# Patient Record
Sex: Female | Born: 1945 | Race: White | Hispanic: No | Smoking: Never smoker
Health system: Southern US, Community
[De-identification: ages and names within clinical notes are randomized; demographics above are authoritative.]

## PROBLEM LIST (undated history)

## (undated) DIAGNOSIS — E8881 Metabolic syndrome: Secondary | ICD-10-CM

## (undated) DIAGNOSIS — F411 Generalized anxiety disorder: Secondary | ICD-10-CM

## (undated) DIAGNOSIS — I10 Essential (primary) hypertension: Secondary | ICD-10-CM

## (undated) DIAGNOSIS — I2510 Atherosclerotic heart disease of native coronary artery without angina pectoris: Secondary | ICD-10-CM

## (undated) DIAGNOSIS — H409 Unspecified glaucoma: Secondary | ICD-10-CM

## (undated) DIAGNOSIS — M1990 Unspecified osteoarthritis, unspecified site: Secondary | ICD-10-CM

## (undated) DIAGNOSIS — K469 Unspecified abdominal hernia without obstruction or gangrene: Secondary | ICD-10-CM

## (undated) DIAGNOSIS — H269 Unspecified cataract: Secondary | ICD-10-CM

## (undated) DIAGNOSIS — Z9889 Other specified postprocedural states: Secondary | ICD-10-CM

## (undated) DIAGNOSIS — K5790 Diverticulosis of intestine, part unspecified, without perforation or abscess without bleeding: Secondary | ICD-10-CM

## (undated) DIAGNOSIS — D649 Anemia, unspecified: Secondary | ICD-10-CM

## (undated) DIAGNOSIS — T8859XA Other complications of anesthesia, initial encounter: Secondary | ICD-10-CM

## (undated) DIAGNOSIS — Z86010 Personal history of colonic polyps: Secondary | ICD-10-CM

## (undated) DIAGNOSIS — E785 Hyperlipidemia, unspecified: Secondary | ICD-10-CM

## (undated) DIAGNOSIS — I255 Ischemic cardiomyopathy: Secondary | ICD-10-CM

## (undated) DIAGNOSIS — G5600 Carpal tunnel syndrome, unspecified upper limb: Secondary | ICD-10-CM

## (undated) DIAGNOSIS — I219 Acute myocardial infarction, unspecified: Secondary | ICD-10-CM

## (undated) DIAGNOSIS — R112 Nausea with vomiting, unspecified: Secondary | ICD-10-CM

## (undated) DIAGNOSIS — G43009 Migraine without aura, not intractable, without status migrainosus: Secondary | ICD-10-CM

## (undated) DIAGNOSIS — H189 Unspecified disorder of cornea: Secondary | ICD-10-CM

## (undated) DIAGNOSIS — T4145XA Adverse effect of unspecified anesthetic, initial encounter: Secondary | ICD-10-CM

## (undated) DIAGNOSIS — S4290XA Fracture of unspecified shoulder girdle, part unspecified, initial encounter for closed fracture: Secondary | ICD-10-CM

## (undated) DIAGNOSIS — K219 Gastro-esophageal reflux disease without esophagitis: Secondary | ICD-10-CM

## (undated) DIAGNOSIS — Z860101 Personal history of adenomatous and serrated colon polyps: Secondary | ICD-10-CM

## (undated) DIAGNOSIS — G4730 Sleep apnea, unspecified: Secondary | ICD-10-CM

## (undated) DIAGNOSIS — G44049 Chronic paroxysmal hemicrania, not intractable: Secondary | ICD-10-CM

## (undated) HISTORY — PX: CORNEAL TRANSPLANT: SHX108

## (undated) HISTORY — DX: Fracture of unspecified shoulder girdle, part unspecified, initial encounter for closed fracture: S42.90XA

## (undated) HISTORY — DX: Chronic paroxysmal hemicrania, not intractable: G44.049

## (undated) HISTORY — DX: Atherosclerotic heart disease of native coronary artery without angina pectoris: I25.10

## (undated) HISTORY — PX: OTHER SURGICAL HISTORY: SHX169

## (undated) HISTORY — DX: Generalized anxiety disorder: F41.1

## (undated) HISTORY — PX: TOTAL KNEE ARTHROPLASTY: SHX125

## (undated) HISTORY — DX: Acute myocardial infarction, unspecified: I21.9

## (undated) HISTORY — DX: Anemia, unspecified: D64.9

## (undated) HISTORY — PX: ABDOMINAL HYSTERECTOMY: SHX81

## (undated) HISTORY — PX: COLONOSCOPY W/ POLYPECTOMY: SHX1380

## (undated) HISTORY — DX: Unspecified disorder of cornea: H18.9

## (undated) HISTORY — DX: Unspecified cataract: H26.9

## (undated) HISTORY — PX: BUNIONECTOMY: SHX129

## (undated) HISTORY — DX: Essential (primary) hypertension: I10

## (undated) HISTORY — DX: Personal history of colonic polyps: Z86.010

## (undated) HISTORY — DX: Metabolic syndrome: E88.81

## (undated) HISTORY — DX: Sleep apnea, unspecified: G47.30

## (undated) HISTORY — DX: Ischemic cardiomyopathy: I25.5

## (undated) HISTORY — PX: APPENDECTOMY: SHX54

## (undated) HISTORY — DX: Metabolic syndrome: E88.810

## (undated) HISTORY — PX: CHOLECYSTECTOMY: SHX55

## (undated) HISTORY — DX: Carpal tunnel syndrome, unspecified upper limb: G56.00

## (undated) HISTORY — DX: Gastro-esophageal reflux disease without esophagitis: K21.9

## (undated) HISTORY — DX: Unspecified glaucoma: H40.9

## (undated) HISTORY — DX: Unspecified abdominal hernia without obstruction or gangrene: K46.9

## (undated) HISTORY — DX: Unspecified osteoarthritis, unspecified site: M19.90

## (undated) HISTORY — DX: Diverticulosis of intestine, part unspecified, without perforation or abscess without bleeding: K57.90

## (undated) HISTORY — DX: Migraine without aura, not intractable, without status migrainosus: G43.009

## (undated) HISTORY — PX: HEMORRHOID SURGERY: SHX153

## (undated) HISTORY — DX: Personal history of adenomatous and serrated colon polyps: Z86.0101

## (undated) HISTORY — DX: Hyperlipidemia, unspecified: E78.5

---

## 1994-02-13 HISTORY — PX: BREAST LUMPECTOMY: SHX2

## 1994-02-15 HISTORY — PX: BREAST EXCISIONAL BIOPSY: SUR124

## 1997-08-22 ENCOUNTER — Inpatient Hospital Stay (HOSPITAL_COMMUNITY): Admission: EM | Admit: 1997-08-22 | Discharge: 1997-08-25 | Payer: Self-pay | Admitting: Emergency Medicine

## 1997-09-18 ENCOUNTER — Inpatient Hospital Stay (HOSPITAL_COMMUNITY): Admission: RE | Admit: 1997-09-18 | Discharge: 1997-09-20 | Payer: Self-pay | Admitting: Neurosurgery

## 1998-02-26 ENCOUNTER — Encounter: Payer: Self-pay | Admitting: Neurosurgery

## 1998-02-26 ENCOUNTER — Ambulatory Visit (HOSPITAL_COMMUNITY): Admission: RE | Admit: 1998-02-26 | Discharge: 1998-02-26 | Payer: Self-pay | Admitting: Neurosurgery

## 1998-03-10 ENCOUNTER — Encounter: Payer: Self-pay | Admitting: Neurosurgery

## 1998-03-10 ENCOUNTER — Ambulatory Visit (HOSPITAL_COMMUNITY): Admission: RE | Admit: 1998-03-10 | Discharge: 1998-03-10 | Payer: Self-pay | Admitting: Neurosurgery

## 1998-03-26 ENCOUNTER — Encounter: Payer: Self-pay | Admitting: Neurosurgery

## 1998-03-26 ENCOUNTER — Ambulatory Visit (HOSPITAL_COMMUNITY): Admission: RE | Admit: 1998-03-26 | Discharge: 1998-03-26 | Payer: Self-pay | Admitting: Neurosurgery

## 1998-04-09 ENCOUNTER — Ambulatory Visit (HOSPITAL_COMMUNITY): Admission: RE | Admit: 1998-04-09 | Discharge: 1998-04-09 | Payer: Self-pay | Admitting: Neurosurgery

## 1998-04-09 ENCOUNTER — Encounter: Payer: Self-pay | Admitting: Neurosurgery

## 1998-06-23 ENCOUNTER — Ambulatory Visit (HOSPITAL_COMMUNITY): Admission: RE | Admit: 1998-06-23 | Discharge: 1998-06-23 | Payer: Self-pay | Admitting: Neurosurgery

## 1998-06-23 ENCOUNTER — Encounter: Payer: Self-pay | Admitting: Neurosurgery

## 1999-03-17 ENCOUNTER — Encounter: Payer: Self-pay | Admitting: Emergency Medicine

## 1999-03-17 ENCOUNTER — Emergency Department (HOSPITAL_COMMUNITY): Admission: EM | Admit: 1999-03-17 | Discharge: 1999-03-17 | Payer: Self-pay | Admitting: Emergency Medicine

## 1999-04-05 ENCOUNTER — Encounter: Payer: Self-pay | Admitting: Gastroenterology

## 1999-04-05 ENCOUNTER — Ambulatory Visit (HOSPITAL_COMMUNITY): Admission: RE | Admit: 1999-04-05 | Discharge: 1999-04-05 | Payer: Self-pay | Admitting: Gastroenterology

## 1999-04-18 ENCOUNTER — Ambulatory Visit (HOSPITAL_COMMUNITY): Admission: RE | Admit: 1999-04-18 | Discharge: 1999-04-18 | Payer: Self-pay | Admitting: Internal Medicine

## 1999-04-18 ENCOUNTER — Encounter: Payer: Self-pay | Admitting: Internal Medicine

## 1999-04-20 ENCOUNTER — Encounter (INDEPENDENT_AMBULATORY_CARE_PROVIDER_SITE_OTHER): Payer: Self-pay | Admitting: Specialist

## 1999-04-20 ENCOUNTER — Observation Stay (HOSPITAL_COMMUNITY): Admission: EM | Admit: 1999-04-20 | Discharge: 1999-04-21 | Payer: Self-pay | Admitting: Emergency Medicine

## 1999-09-08 ENCOUNTER — Encounter: Admission: RE | Admit: 1999-09-08 | Discharge: 1999-09-08 | Payer: Self-pay | Admitting: Gynecology

## 1999-09-08 ENCOUNTER — Encounter: Payer: Self-pay | Admitting: Gynecology

## 1999-09-11 ENCOUNTER — Ambulatory Visit (HOSPITAL_COMMUNITY): Admission: RE | Admit: 1999-09-11 | Discharge: 1999-09-11 | Payer: Self-pay | Admitting: Neurosurgery

## 1999-09-11 ENCOUNTER — Encounter: Payer: Self-pay | Admitting: Neurosurgery

## 1999-10-01 ENCOUNTER — Ambulatory Visit (HOSPITAL_COMMUNITY): Admission: RE | Admit: 1999-10-01 | Discharge: 1999-10-01 | Payer: Self-pay | Admitting: Neurosurgery

## 1999-10-01 ENCOUNTER — Encounter: Payer: Self-pay | Admitting: Neurosurgery

## 1999-10-02 ENCOUNTER — Encounter: Payer: Self-pay | Admitting: Neurosurgery

## 1999-11-15 ENCOUNTER — Other Ambulatory Visit: Admission: RE | Admit: 1999-11-15 | Discharge: 1999-11-15 | Payer: Self-pay | Admitting: Gynecology

## 2000-05-21 ENCOUNTER — Encounter: Payer: Self-pay | Admitting: Neurosurgery

## 2000-05-21 ENCOUNTER — Ambulatory Visit (HOSPITAL_COMMUNITY): Admission: RE | Admit: 2000-05-21 | Discharge: 2000-05-21 | Payer: Self-pay | Admitting: Neurosurgery

## 2000-06-06 ENCOUNTER — Encounter: Payer: Self-pay | Admitting: Neurosurgery

## 2000-06-06 ENCOUNTER — Encounter: Admission: RE | Admit: 2000-06-06 | Discharge: 2000-06-06 | Payer: Self-pay | Admitting: Neurosurgery

## 2000-06-25 ENCOUNTER — Encounter: Payer: Self-pay | Admitting: Neurosurgery

## 2000-06-25 ENCOUNTER — Encounter: Admission: RE | Admit: 2000-06-25 | Discharge: 2000-06-25 | Payer: Self-pay | Admitting: Neurosurgery

## 2000-07-10 ENCOUNTER — Encounter: Payer: Self-pay | Admitting: Neurosurgery

## 2000-07-10 ENCOUNTER — Encounter: Admission: RE | Admit: 2000-07-10 | Discharge: 2000-07-10 | Payer: Self-pay | Admitting: Neurosurgery

## 2000-08-21 ENCOUNTER — Encounter: Payer: Self-pay | Admitting: Neurosurgery

## 2000-08-21 ENCOUNTER — Encounter: Admission: RE | Admit: 2000-08-21 | Discharge: 2000-08-21 | Payer: Self-pay | Admitting: Neurosurgery

## 2000-08-31 ENCOUNTER — Encounter: Payer: Self-pay | Admitting: Gastroenterology

## 2000-08-31 ENCOUNTER — Encounter: Admission: RE | Admit: 2000-08-31 | Discharge: 2000-08-31 | Payer: Self-pay | Admitting: Gastroenterology

## 2000-09-03 ENCOUNTER — Encounter: Payer: Self-pay | Admitting: Neurosurgery

## 2000-09-03 ENCOUNTER — Encounter: Admission: RE | Admit: 2000-09-03 | Discharge: 2000-09-03 | Payer: Self-pay | Admitting: Neurosurgery

## 2000-09-14 ENCOUNTER — Encounter: Admission: RE | Admit: 2000-09-14 | Discharge: 2000-09-14 | Payer: Self-pay | Admitting: Internal Medicine

## 2000-09-14 ENCOUNTER — Encounter: Payer: Self-pay | Admitting: Internal Medicine

## 2000-12-27 ENCOUNTER — Encounter (INDEPENDENT_AMBULATORY_CARE_PROVIDER_SITE_OTHER): Payer: Self-pay | Admitting: Gastroenterology

## 2001-01-07 ENCOUNTER — Encounter: Admission: RE | Admit: 2001-01-07 | Discharge: 2001-01-07 | Payer: Self-pay | Admitting: Neurosurgery

## 2001-01-07 ENCOUNTER — Encounter: Payer: Self-pay | Admitting: Neurosurgery

## 2001-01-25 ENCOUNTER — Encounter: Admission: RE | Admit: 2001-01-25 | Discharge: 2001-01-25 | Payer: Self-pay | Admitting: Orthopaedic Surgery

## 2001-01-25 ENCOUNTER — Encounter: Payer: Self-pay | Admitting: Orthopaedic Surgery

## 2001-04-29 ENCOUNTER — Encounter: Admission: RE | Admit: 2001-04-29 | Discharge: 2001-04-29 | Payer: Self-pay | Admitting: Orthopaedic Surgery

## 2001-04-29 ENCOUNTER — Encounter: Payer: Self-pay | Admitting: Orthopaedic Surgery

## 2001-10-01 ENCOUNTER — Encounter: Admission: RE | Admit: 2001-10-01 | Discharge: 2001-10-01 | Payer: Self-pay | Admitting: Gynecology

## 2001-10-01 ENCOUNTER — Encounter: Payer: Self-pay | Admitting: Gynecology

## 2002-01-15 ENCOUNTER — Encounter: Payer: Self-pay | Admitting: Orthopedic Surgery

## 2002-01-15 ENCOUNTER — Encounter: Admission: RE | Admit: 2002-01-15 | Discharge: 2002-01-15 | Payer: Self-pay | Admitting: Orthopedic Surgery

## 2002-01-29 ENCOUNTER — Encounter: Payer: Self-pay | Admitting: Orthopaedic Surgery

## 2002-01-29 ENCOUNTER — Encounter: Admission: RE | Admit: 2002-01-29 | Discharge: 2002-01-29 | Payer: Self-pay | Admitting: Orthopaedic Surgery

## 2002-02-11 ENCOUNTER — Encounter: Admission: RE | Admit: 2002-02-11 | Discharge: 2002-02-11 | Payer: Self-pay | Admitting: Orthopaedic Surgery

## 2002-02-11 ENCOUNTER — Encounter: Payer: Self-pay | Admitting: Orthopaedic Surgery

## 2002-02-25 ENCOUNTER — Encounter: Admission: RE | Admit: 2002-02-25 | Discharge: 2002-02-25 | Payer: Self-pay | Admitting: Orthopaedic Surgery

## 2002-02-25 ENCOUNTER — Encounter: Payer: Self-pay | Admitting: Orthopaedic Surgery

## 2002-03-05 ENCOUNTER — Encounter: Payer: Self-pay | Admitting: Orthopedic Surgery

## 2002-03-12 ENCOUNTER — Observation Stay (HOSPITAL_COMMUNITY): Admission: RE | Admit: 2002-03-12 | Discharge: 2002-03-13 | Payer: Self-pay | Admitting: Orthopedic Surgery

## 2002-03-12 ENCOUNTER — Encounter: Payer: Self-pay | Admitting: Orthopedic Surgery

## 2002-05-15 ENCOUNTER — Other Ambulatory Visit: Admission: RE | Admit: 2002-05-15 | Discharge: 2002-05-15 | Payer: Self-pay | Admitting: Gynecology

## 2002-10-14 ENCOUNTER — Encounter: Admission: RE | Admit: 2002-10-14 | Discharge: 2002-10-14 | Payer: Self-pay | Admitting: Neurosurgery

## 2002-10-14 ENCOUNTER — Encounter: Payer: Self-pay | Admitting: Neurosurgery

## 2002-11-19 ENCOUNTER — Ambulatory Visit (HOSPITAL_COMMUNITY): Admission: RE | Admit: 2002-11-19 | Discharge: 2002-11-19 | Payer: Self-pay | Admitting: Gynecology

## 2002-11-19 ENCOUNTER — Encounter: Payer: Self-pay | Admitting: Gynecology

## 2002-12-12 ENCOUNTER — Encounter: Admission: RE | Admit: 2002-12-12 | Discharge: 2002-12-12 | Payer: Self-pay | Admitting: Obstetrics and Gynecology

## 2002-12-30 ENCOUNTER — Encounter: Admission: RE | Admit: 2002-12-30 | Discharge: 2002-12-30 | Payer: Self-pay | Admitting: Obstetrics and Gynecology

## 2003-01-28 ENCOUNTER — Encounter: Admission: RE | Admit: 2003-01-28 | Discharge: 2003-01-28 | Payer: Self-pay | Admitting: Neurosurgery

## 2003-02-11 ENCOUNTER — Encounter: Admission: RE | Admit: 2003-02-11 | Discharge: 2003-02-11 | Payer: Self-pay | Admitting: Neurosurgery

## 2003-08-27 ENCOUNTER — Encounter: Admission: RE | Admit: 2003-08-27 | Discharge: 2003-08-27 | Payer: Self-pay | Admitting: Neurosurgery

## 2003-10-27 ENCOUNTER — Encounter: Admission: RE | Admit: 2003-10-27 | Discharge: 2003-10-27 | Payer: Self-pay | Admitting: Neurosurgery

## 2003-11-24 ENCOUNTER — Encounter: Admission: RE | Admit: 2003-11-24 | Discharge: 2003-11-24 | Payer: Self-pay | Admitting: Gynecology

## 2003-12-22 ENCOUNTER — Ambulatory Visit: Payer: Self-pay | Admitting: Internal Medicine

## 2004-03-28 ENCOUNTER — Other Ambulatory Visit: Admission: RE | Admit: 2004-03-28 | Discharge: 2004-03-28 | Payer: Self-pay | Admitting: Obstetrics and Gynecology

## 2004-06-14 ENCOUNTER — Encounter: Admission: RE | Admit: 2004-06-14 | Discharge: 2004-06-14 | Payer: Self-pay | Admitting: Neurosurgery

## 2004-09-19 ENCOUNTER — Encounter: Admission: RE | Admit: 2004-09-19 | Discharge: 2004-09-19 | Payer: Self-pay | Admitting: Neurosurgery

## 2004-09-29 ENCOUNTER — Ambulatory Visit: Payer: Self-pay | Admitting: Internal Medicine

## 2004-12-09 ENCOUNTER — Ambulatory Visit: Payer: Self-pay | Admitting: Internal Medicine

## 2004-12-19 ENCOUNTER — Ambulatory Visit: Payer: Self-pay | Admitting: Gastroenterology

## 2004-12-26 ENCOUNTER — Encounter: Admission: RE | Admit: 2004-12-26 | Discharge: 2004-12-26 | Payer: Self-pay | Admitting: Obstetrics and Gynecology

## 2005-01-13 ENCOUNTER — Encounter: Admission: RE | Admit: 2005-01-13 | Discharge: 2005-01-13 | Payer: Self-pay | Admitting: Neurosurgery

## 2005-05-23 ENCOUNTER — Encounter: Admission: RE | Admit: 2005-05-23 | Discharge: 2005-05-23 | Payer: Self-pay | Admitting: Neurosurgery

## 2005-06-02 ENCOUNTER — Encounter: Admission: RE | Admit: 2005-06-02 | Discharge: 2005-06-02 | Payer: Self-pay | Admitting: Neurosurgery

## 2005-07-04 ENCOUNTER — Encounter: Admission: RE | Admit: 2005-07-04 | Discharge: 2005-07-04 | Payer: Self-pay | Admitting: Neurosurgery

## 2005-07-18 ENCOUNTER — Encounter: Admission: RE | Admit: 2005-07-18 | Discharge: 2005-07-18 | Payer: Self-pay | Admitting: Neurosurgery

## 2005-11-21 ENCOUNTER — Ambulatory Visit: Payer: Self-pay | Admitting: Internal Medicine

## 2005-11-22 ENCOUNTER — Encounter: Admission: RE | Admit: 2005-11-22 | Discharge: 2005-11-22 | Payer: Self-pay | Admitting: Neurosurgery

## 2005-11-30 ENCOUNTER — Ambulatory Visit: Payer: Self-pay | Admitting: Internal Medicine

## 2006-02-01 ENCOUNTER — Encounter: Admission: RE | Admit: 2006-02-01 | Discharge: 2006-02-01 | Payer: Self-pay | Admitting: Neurosurgery

## 2006-02-26 ENCOUNTER — Encounter: Admission: RE | Admit: 2006-02-26 | Discharge: 2006-02-26 | Payer: Self-pay | Admitting: Internal Medicine

## 2006-03-13 ENCOUNTER — Encounter: Admission: RE | Admit: 2006-03-13 | Discharge: 2006-03-13 | Payer: Self-pay | Admitting: Internal Medicine

## 2006-10-23 ENCOUNTER — Ambulatory Visit: Payer: Self-pay | Admitting: Internal Medicine

## 2006-10-25 LAB — CONVERTED CEMR LAB
AST: 24 units/L (ref 0–37)
Amylase: 44 units/L (ref 27–131)
Bilirubin, Direct: 0.1 mg/dL (ref 0.0–0.3)
Chloride: 102 meq/L (ref 96–112)
Creatinine, Ser: 0.8 mg/dL (ref 0.4–1.2)
Eosinophils Relative: 1.5 % (ref 0.0–5.0)
Glucose, Bld: 90 mg/dL (ref 70–99)
HCT: 40.3 % (ref 36.0–46.0)
Hemoglobin: 14.2 g/dL (ref 12.0–15.0)
MCV: 89.6 fL (ref 78.0–100.0)
Monocytes Absolute: 0.8 10*3/uL — ABNORMAL HIGH (ref 0.2–0.7)
Neutrophils Relative %: 58.5 % (ref 43.0–77.0)
RBC: 4.5 M/uL (ref 3.87–5.11)
RDW: 13.4 % (ref 11.5–14.6)
Sodium: 140 meq/L (ref 135–145)
Total Bilirubin: 0.6 mg/dL (ref 0.3–1.2)
Total Protein: 7.1 g/dL (ref 6.0–8.3)
WBC: 7.5 10*3/uL (ref 4.5–10.5)

## 2006-10-27 ENCOUNTER — Ambulatory Visit (HOSPITAL_COMMUNITY): Admission: RE | Admit: 2006-10-27 | Discharge: 2006-10-27 | Payer: Self-pay | Admitting: Family Medicine

## 2006-10-29 ENCOUNTER — Telehealth (INDEPENDENT_AMBULATORY_CARE_PROVIDER_SITE_OTHER): Payer: Self-pay | Admitting: *Deleted

## 2006-10-30 ENCOUNTER — Ambulatory Visit: Payer: Self-pay | Admitting: Internal Medicine

## 2006-10-30 DIAGNOSIS — I10 Essential (primary) hypertension: Secondary | ICD-10-CM | POA: Insufficient documentation

## 2006-10-31 ENCOUNTER — Encounter: Admission: RE | Admit: 2006-10-31 | Discharge: 2006-10-31 | Payer: Self-pay | Admitting: Internal Medicine

## 2006-10-31 ENCOUNTER — Encounter (INDEPENDENT_AMBULATORY_CARE_PROVIDER_SITE_OTHER): Payer: Self-pay | Admitting: *Deleted

## 2006-11-01 ENCOUNTER — Encounter: Payer: Self-pay | Admitting: Internal Medicine

## 2006-11-01 ENCOUNTER — Encounter (INDEPENDENT_AMBULATORY_CARE_PROVIDER_SITE_OTHER): Payer: Self-pay | Admitting: *Deleted

## 2006-11-12 ENCOUNTER — Ambulatory Visit: Payer: Self-pay | Admitting: Internal Medicine

## 2006-11-12 DIAGNOSIS — F411 Generalized anxiety disorder: Secondary | ICD-10-CM | POA: Insufficient documentation

## 2006-11-15 ENCOUNTER — Ambulatory Visit: Payer: Self-pay | Admitting: Gastroenterology

## 2006-11-16 ENCOUNTER — Encounter: Payer: Self-pay | Admitting: Internal Medicine

## 2006-11-16 ENCOUNTER — Ambulatory Visit: Payer: Self-pay | Admitting: Gastroenterology

## 2006-11-16 DIAGNOSIS — K449 Diaphragmatic hernia without obstruction or gangrene: Secondary | ICD-10-CM | POA: Insufficient documentation

## 2006-12-24 ENCOUNTER — Ambulatory Visit: Payer: Self-pay | Admitting: Internal Medicine

## 2007-02-22 ENCOUNTER — Ambulatory Visit: Payer: Self-pay | Admitting: Gastroenterology

## 2007-04-23 DIAGNOSIS — D126 Benign neoplasm of colon, unspecified: Secondary | ICD-10-CM

## 2007-04-23 DIAGNOSIS — K219 Gastro-esophageal reflux disease without esophagitis: Secondary | ICD-10-CM

## 2007-04-23 DIAGNOSIS — K5730 Diverticulosis of large intestine without perforation or abscess without bleeding: Secondary | ICD-10-CM | POA: Insufficient documentation

## 2007-04-25 ENCOUNTER — Ambulatory Visit: Payer: Self-pay | Admitting: Internal Medicine

## 2007-04-25 DIAGNOSIS — G5600 Carpal tunnel syndrome, unspecified upper limb: Secondary | ICD-10-CM | POA: Insufficient documentation

## 2007-04-30 ENCOUNTER — Encounter: Admission: RE | Admit: 2007-04-30 | Discharge: 2007-04-30 | Payer: Self-pay | Admitting: Internal Medicine

## 2007-04-30 ENCOUNTER — Encounter (INDEPENDENT_AMBULATORY_CARE_PROVIDER_SITE_OTHER): Payer: Self-pay | Admitting: *Deleted

## 2007-05-02 ENCOUNTER — Encounter (INDEPENDENT_AMBULATORY_CARE_PROVIDER_SITE_OTHER): Payer: Self-pay | Admitting: *Deleted

## 2007-05-28 ENCOUNTER — Encounter: Payer: Self-pay | Admitting: Internal Medicine

## 2007-06-05 ENCOUNTER — Encounter: Payer: Self-pay | Admitting: Internal Medicine

## 2007-06-19 ENCOUNTER — Ambulatory Visit: Payer: Self-pay | Admitting: Internal Medicine

## 2007-06-19 DIAGNOSIS — IMO0002 Reserved for concepts with insufficient information to code with codable children: Secondary | ICD-10-CM | POA: Insufficient documentation

## 2007-06-19 DIAGNOSIS — E8881 Metabolic syndrome: Secondary | ICD-10-CM

## 2007-06-19 DIAGNOSIS — M1710 Unilateral primary osteoarthritis, unspecified knee: Secondary | ICD-10-CM | POA: Insufficient documentation

## 2007-07-31 ENCOUNTER — Inpatient Hospital Stay (HOSPITAL_COMMUNITY): Admission: RE | Admit: 2007-07-31 | Discharge: 2007-08-04 | Payer: Self-pay | Admitting: Orthopedic Surgery

## 2007-08-28 ENCOUNTER — Ambulatory Visit: Payer: Self-pay | Admitting: Internal Medicine

## 2007-08-28 ENCOUNTER — Telehealth (INDEPENDENT_AMBULATORY_CARE_PROVIDER_SITE_OTHER): Payer: Self-pay | Admitting: *Deleted

## 2007-08-28 LAB — CONVERTED CEMR LAB: Hemoglobin: 13 g/dL

## 2007-08-29 ENCOUNTER — Encounter (INDEPENDENT_AMBULATORY_CARE_PROVIDER_SITE_OTHER): Payer: Self-pay | Admitting: *Deleted

## 2007-08-29 LAB — CONVERTED CEMR LAB
HCT: 38.1 % (ref 36.0–46.0)
Hemoglobin: 12.7 g/dL (ref 12.0–15.0)
MCHC: 33.3 g/dL (ref 30.0–36.0)
MCV: 88.2 fL (ref 78.0–100.0)
Monocytes Absolute: 0.6 10*3/uL (ref 0.1–1.0)
Monocytes Relative: 8 % (ref 3.0–12.0)
Neutro Abs: 5.6 10*3/uL (ref 1.4–7.7)
Potassium: 4.1 meq/L (ref 3.5–5.1)
RDW: 13.9 % (ref 11.5–14.6)

## 2007-11-06 ENCOUNTER — Ambulatory Visit: Payer: Self-pay | Admitting: Internal Medicine

## 2007-12-27 ENCOUNTER — Encounter: Payer: Self-pay | Admitting: Internal Medicine

## 2008-01-15 ENCOUNTER — Ambulatory Visit: Payer: Self-pay | Admitting: Gastroenterology

## 2008-01-24 ENCOUNTER — Ambulatory Visit: Payer: Self-pay | Admitting: Internal Medicine

## 2008-01-31 ENCOUNTER — Telehealth: Payer: Self-pay | Admitting: Gastroenterology

## 2008-02-18 ENCOUNTER — Telehealth (INDEPENDENT_AMBULATORY_CARE_PROVIDER_SITE_OTHER): Payer: Self-pay | Admitting: *Deleted

## 2008-03-17 ENCOUNTER — Telehealth (INDEPENDENT_AMBULATORY_CARE_PROVIDER_SITE_OTHER): Payer: Self-pay | Admitting: *Deleted

## 2008-04-30 ENCOUNTER — Encounter: Admission: RE | Admit: 2008-04-30 | Discharge: 2008-04-30 | Payer: Self-pay | Admitting: Obstetrics and Gynecology

## 2008-06-22 ENCOUNTER — Ambulatory Visit: Payer: Self-pay | Admitting: Internal Medicine

## 2008-07-14 ENCOUNTER — Telehealth (INDEPENDENT_AMBULATORY_CARE_PROVIDER_SITE_OTHER): Payer: Self-pay | Admitting: *Deleted

## 2008-07-14 ENCOUNTER — Emergency Department (HOSPITAL_COMMUNITY): Admission: EM | Admit: 2008-07-14 | Discharge: 2008-07-14 | Payer: Self-pay | Admitting: Emergency Medicine

## 2008-07-15 ENCOUNTER — Ambulatory Visit: Payer: Self-pay | Admitting: Internal Medicine

## 2008-07-15 DIAGNOSIS — M549 Dorsalgia, unspecified: Secondary | ICD-10-CM | POA: Insufficient documentation

## 2008-07-27 ENCOUNTER — Telehealth: Payer: Self-pay | Admitting: Gastroenterology

## 2008-07-31 ENCOUNTER — Telehealth: Payer: Self-pay | Admitting: Internal Medicine

## 2008-10-02 ENCOUNTER — Ambulatory Visit: Payer: Self-pay | Admitting: Gastroenterology

## 2008-10-02 ENCOUNTER — Telehealth: Payer: Self-pay | Admitting: Gastroenterology

## 2008-10-02 DIAGNOSIS — K648 Other hemorrhoids: Secondary | ICD-10-CM | POA: Insufficient documentation

## 2008-10-02 DIAGNOSIS — R1310 Dysphagia, unspecified: Secondary | ICD-10-CM | POA: Insufficient documentation

## 2008-10-06 ENCOUNTER — Telehealth: Payer: Self-pay | Admitting: Gastroenterology

## 2008-10-07 ENCOUNTER — Telehealth: Payer: Self-pay | Admitting: Gastroenterology

## 2008-11-04 ENCOUNTER — Ambulatory Visit: Payer: Self-pay | Admitting: Internal Medicine

## 2008-11-04 LAB — CONVERTED CEMR LAB
Bilirubin Urine: NEGATIVE
Protein, U semiquant: NEGATIVE
Urobilinogen, UA: 0.2

## 2008-12-11 ENCOUNTER — Ambulatory Visit: Payer: Self-pay | Admitting: Internal Medicine

## 2008-12-11 DIAGNOSIS — T887XXA Unspecified adverse effect of drug or medicament, initial encounter: Secondary | ICD-10-CM | POA: Insufficient documentation

## 2008-12-11 DIAGNOSIS — H189 Unspecified disorder of cornea: Secondary | ICD-10-CM | POA: Insufficient documentation

## 2008-12-11 DIAGNOSIS — N951 Menopausal and female climacteric states: Secondary | ICD-10-CM

## 2008-12-14 ENCOUNTER — Encounter (INDEPENDENT_AMBULATORY_CARE_PROVIDER_SITE_OTHER): Payer: Self-pay | Admitting: *Deleted

## 2008-12-14 LAB — CONVERTED CEMR LAB
Cholesterol: 205 mg/dL — ABNORMAL HIGH (ref 0–200)
Potassium: 4.6 meq/L (ref 3.5–5.1)
Total CHOL/HDL Ratio: 4
Triglycerides: 151 mg/dL — ABNORMAL HIGH (ref 0.0–149.0)
VLDL: 30.2 mg/dL (ref 0.0–40.0)

## 2009-02-02 ENCOUNTER — Telehealth: Payer: Self-pay | Admitting: Gastroenterology

## 2009-02-02 ENCOUNTER — Ambulatory Visit: Payer: Self-pay | Admitting: Internal Medicine

## 2009-02-02 DIAGNOSIS — K6289 Other specified diseases of anus and rectum: Secondary | ICD-10-CM

## 2009-02-02 DIAGNOSIS — M25559 Pain in unspecified hip: Secondary | ICD-10-CM

## 2009-03-17 ENCOUNTER — Ambulatory Visit: Payer: Self-pay | Admitting: Internal Medicine

## 2009-03-18 ENCOUNTER — Ambulatory Visit: Payer: Self-pay | Admitting: Internal Medicine

## 2009-05-11 ENCOUNTER — Ambulatory Visit: Payer: Self-pay | Admitting: Internal Medicine

## 2009-05-11 DIAGNOSIS — M533 Sacrococcygeal disorders, not elsewhere classified: Secondary | ICD-10-CM

## 2009-05-11 LAB — CONVERTED CEMR LAB
Bilirubin Urine: NEGATIVE
Blood in Urine, dipstick: NEGATIVE
Glucose, Urine, Semiquant: NEGATIVE
Ketones, urine, test strip: NEGATIVE
Protein, U semiquant: NEGATIVE
pH: 5

## 2009-05-12 ENCOUNTER — Telehealth: Payer: Self-pay | Admitting: Gastroenterology

## 2009-05-13 ENCOUNTER — Telehealth (INDEPENDENT_AMBULATORY_CARE_PROVIDER_SITE_OTHER): Payer: Self-pay | Admitting: *Deleted

## 2009-05-13 ENCOUNTER — Ambulatory Visit: Payer: Self-pay | Admitting: Internal Medicine

## 2009-05-13 DIAGNOSIS — K644 Residual hemorrhoidal skin tags: Secondary | ICD-10-CM | POA: Insufficient documentation

## 2009-05-13 DIAGNOSIS — K648 Other hemorrhoids: Secondary | ICD-10-CM | POA: Insufficient documentation

## 2009-05-14 ENCOUNTER — Telehealth: Payer: Self-pay | Admitting: Nurse Practitioner

## 2009-05-17 ENCOUNTER — Encounter: Payer: Self-pay | Admitting: Internal Medicine

## 2009-06-03 ENCOUNTER — Ambulatory Visit (HOSPITAL_COMMUNITY): Admission: RE | Admit: 2009-06-03 | Discharge: 2009-06-03 | Payer: Self-pay | Admitting: General Surgery

## 2009-06-15 ENCOUNTER — Encounter: Payer: Self-pay | Admitting: Internal Medicine

## 2009-06-21 ENCOUNTER — Ambulatory Visit: Payer: Self-pay | Admitting: Gastroenterology

## 2009-06-22 ENCOUNTER — Encounter: Payer: Self-pay | Admitting: Gastroenterology

## 2009-06-22 ENCOUNTER — Telehealth: Payer: Self-pay | Admitting: Gastroenterology

## 2009-07-21 ENCOUNTER — Ambulatory Visit: Payer: Self-pay | Admitting: Gastroenterology

## 2009-07-23 ENCOUNTER — Encounter: Payer: Self-pay | Admitting: Gastroenterology

## 2009-08-03 ENCOUNTER — Encounter: Admission: RE | Admit: 2009-08-03 | Discharge: 2009-08-03 | Payer: Self-pay | Admitting: Internal Medicine

## 2009-08-09 ENCOUNTER — Ambulatory Visit: Payer: Self-pay | Admitting: Gastroenterology

## 2009-08-11 ENCOUNTER — Telehealth (INDEPENDENT_AMBULATORY_CARE_PROVIDER_SITE_OTHER): Payer: Self-pay | Admitting: *Deleted

## 2009-08-20 ENCOUNTER — Ambulatory Visit: Payer: Self-pay | Admitting: Gastroenterology

## 2009-09-06 ENCOUNTER — Ambulatory Visit: Payer: Self-pay | Admitting: Gastroenterology

## 2009-10-06 ENCOUNTER — Ambulatory Visit: Payer: Self-pay | Admitting: Gastroenterology

## 2009-11-04 ENCOUNTER — Ambulatory Visit: Payer: Self-pay | Admitting: Gastroenterology

## 2009-11-16 ENCOUNTER — Telehealth: Payer: Self-pay | Admitting: Internal Medicine

## 2009-11-16 ENCOUNTER — Ambulatory Visit: Payer: Self-pay | Admitting: Internal Medicine

## 2009-11-18 ENCOUNTER — Ambulatory Visit: Payer: Self-pay | Admitting: Family Medicine

## 2009-11-18 DIAGNOSIS — N390 Urinary tract infection, site not specified: Secondary | ICD-10-CM

## 2009-11-18 LAB — CONVERTED CEMR LAB
Bilirubin Urine: NEGATIVE
Blood in Urine, dipstick: NEGATIVE
Glucose, Urine, Semiquant: NEGATIVE
Ketones, urine, test strip: NEGATIVE
Specific Gravity, Urine: <1.005
pH: 7.5

## 2009-11-30 ENCOUNTER — Ambulatory Visit: Payer: Self-pay | Admitting: Gastroenterology

## 2009-12-01 ENCOUNTER — Encounter (INDEPENDENT_AMBULATORY_CARE_PROVIDER_SITE_OTHER): Payer: Self-pay | Admitting: *Deleted

## 2010-01-13 ENCOUNTER — Ambulatory Visit: Payer: Self-pay | Admitting: Gastroenterology

## 2010-01-13 DIAGNOSIS — R197 Diarrhea, unspecified: Secondary | ICD-10-CM

## 2010-01-21 ENCOUNTER — Ambulatory Visit: Payer: Self-pay | Admitting: Gastroenterology

## 2010-01-21 LAB — HM COLONOSCOPY

## 2010-01-24 ENCOUNTER — Encounter: Payer: Self-pay | Admitting: Gastroenterology

## 2010-01-25 ENCOUNTER — Ambulatory Visit: Payer: Self-pay | Admitting: Internal Medicine

## 2010-01-25 DIAGNOSIS — J069 Acute upper respiratory infection, unspecified: Secondary | ICD-10-CM | POA: Insufficient documentation

## 2010-03-04 ENCOUNTER — Ambulatory Visit
Admission: RE | Admit: 2010-03-04 | Discharge: 2010-03-04 | Payer: Self-pay | Source: Home / Self Care | Attending: Gastroenterology | Admitting: Gastroenterology

## 2010-03-04 ENCOUNTER — Telehealth: Payer: Self-pay | Admitting: Gastroenterology

## 2010-03-05 ENCOUNTER — Encounter: Payer: Self-pay | Admitting: Neurosurgery

## 2010-03-05 ENCOUNTER — Encounter: Payer: Self-pay | Admitting: Obstetrics and Gynecology

## 2010-03-06 ENCOUNTER — Encounter: Payer: Self-pay | Admitting: Neurosurgery

## 2010-03-07 ENCOUNTER — Telehealth: Payer: Self-pay | Admitting: Gastroenterology

## 2010-03-15 NOTE — Assessment & Plan Note (Signed)
Summary: UTI///SPH   Vital Signs:  Patient profile:   65 year old female Weight:      224.4 pounds Temp:     98.3 degrees F oral Pulse rate:   64 / minute Pulse rhythm:   regular BP sitting:   122 / 80  (left arm) Cuff size:   large  Vitals Entered By: Almeta Monas CMA Duncan Dull) (November 18, 2009 10:12 AM) CC: c/o pain when urinating, and frequency, Dysuria   History of Present Illness:  Dysuria      This is a 65 year old woman who presents with Dysuria.  The symptoms began 3 days ago.  The patient complains of burning with urination, urinary frequency, and urgency, but denies hematuria, vaginal discharge, vaginal itching, and vaginal sores.  The patient denies the following associated symptoms: nausea, vomiting, fever, shaking chills, flank pain, abdominal pain, back pain, pelvic pain, and arthralgias.  The patient denies the following risk factors: diabetes, prior antibiotics, immunosuppression, history of GU anomaly, history of pyelonephritis, pregnancy, history of STD, and analgesic abuse.    Current Medications (verified): 1)  Estradiol 1 Mg Tabs (Estradiol) .... Once Daily 2)  Gabapentin 100 Mg  Caps (Gabapentin) .[name].. 1 -3 Every 8 Hrs As Needed 3)  Vitamin Packet 4)  Nexium 40 Mg Cpdr (Esomeprazole Magnesium) .[name].. 1 Tablet By Mouth Once Daily 30 Min Before First Meal 5)  Voltaren 1 % Gel (Diclofenac Sodium) .... Apply Two Times A Day As Needed 6)  Anamantle Hc 3-0.5 % Kit (Lidocaine-Hydrocortisone Ace) .... One Pr Q.h.s. 7)  Losartan Potassium 100 Mg Tabs (Losartan Potassium) .[name].. 1 Once Daily 8)  Ambien Cr 12.5 Mg Cr-Tabs (Zolpidem Tartrate) .[name].. 1 By Mouth At Bedtime 9)  Anusol-Hc 25 Mg Supp (Hydrocortisone Acetate) .... Take One Pr Q.h.s.  Allergies (verified): 1)  ! Codeine 2)  ! * Iodine Contrast 3)  ! Verapamil Hcl (Verapamil Hcl)  Past History:  Past Medical History: Last updated: 02/02/2009 GERD (ICD-530.81) DIVERTICULAR DISEASE (ICD-562.10) Hx of ADENOMATOUS  COLONIC POLYP (ICD-211.3) - 1999 ARTHRITIS (ICD-716.90) HIATAL HERNIA (ICD-553.3) ANXIETY STATE NOS (ICD-300.00) HEADACHE (ICD-784.0) HYPERTENSION, ESSENTIAL NOS (ICD-401.9) CHEST PAIN (ICD-786.50) ABDOMINAL PAIN, CHRONIC (ICD-789.00) SYMPTOM, HEADACHE (ICD-784.0)  Past Surgical History: Last updated: 02/02/2009 Corneal transplants bilat Colon polypectomy Lumbar spur removed Appendectomy Cholecystectomy Hysterectomy Bunionectomy, knee surgery , ankle cystectomy Lumpectomy Endo 10/08 : neg Dr [name] No PMH of perioperative complications except post op nausea Knee Replacement Rectocele surgery  Family History: Last updated: 06/21/2009 diabetes on the paternal side of the family (grandfather) 3 sisters diabetes , 1 CABG maternal side thyroid problems father heart disease , CAD, CVA, dementia  sister breast cancer, PTE pre & post Dx of CA mother positive for breast cancer No FH of Colon Cancer:  Social History: Last updated: 02/02/2009 Never Smoked Alcohol use-yes rarely Regular exercise-yes Occupation: Retired Daily Caffeine Use  Risk Factors: Exercise: yes (04/25/2007)  Risk Factors: Smoking Status: never (04/25/2007)  Family History: Reviewed history from 06/21/2009 and no changes required. diabetes on the paternal side of the family (grandfather) 3 sisters diabetes , 1 CABG maternal side thyroid problems father heart disease , CAD, CVA, dementia  sister breast cancer, PTE pre & post Dx of CA mother positive for breast cancer No FH of Colon Cancer:  Social History: Reviewed history from 02/02/2009 and no changes required. Never Smoked Alcohol use-yes rarely Regular exercise-yes Occupation: Retired Daily Caffeine Use  Review of Systems      See HPI  Physical  Exam  General:  Well-developed,well-nourished,in no acute distress; alert,appropriate and cooperative throughout examination Abdomen:  + min suprabubic tenderness  otherwise abd soft Psych:   Oriented X3 and normally interactive.     Impression & Recommendations:  Problem # 1:  UTI (ICD-599.0)  Her updated medication list for this problem includes:    Cipro 500 Mg Tabs (Ciprofloxacin hcl) .[name]... 1 by mouth two times a day    Pyridium 200 Mg Tabs (Phenazopyridine hcl) .[name]... 1 by mouth three times a day  Orders: T-Culture, Urine ([address]) UA Dipstick w/o Micro (manual) (66440)  Encouraged to push clear liquids, get enough rest, and take acetaminophen as needed. To be seen in 10 days if no improvement, sooner if worse.  Complete Medication List: 1)  Estradiol 1 Mg Tabs (Estradiol) .... Once daily 2)  Gabapentin 100 Mg Caps (Gabapentin) .[name].. 1 -3 every 8 hrs as needed 3)  Vitamin Packet  4)  Nexium 40 Mg Cpdr (Esomeprazole magnesium) .[name].. 1 tablet by mouth once daily 30 min before first meal 5)  Voltaren 1 % Gel (Diclofenac sodium) .... Apply two times a day as needed 6)  Anamantle Hc 3-0.5 % Kit (Lidocaine-hydrocortisone ace) .... One pr q.h.s. 7)  Losartan Potassium 100 Mg Tabs (Losartan potassium) .[name].. 1 once daily 8)  Ambien Cr 12.5 Mg Cr-tabs (Zolpidem tartrate) .[name].. 1 by mouth at bedtime 9)  Anusol-hc 25 Mg Supp (Hydrocortisone acetate) .... Take one pr q.h.s. 10)  Cipro 500 Mg Tabs (Ciprofloxacin hcl) .[name].. 1 by mouth two times a day 11)  Pyridium 200 Mg Tabs (Phenazopyridine hcl) .[name].. 1 by mouth three times a day  Patient Instructions: 1)  Drink plenty of fluids up to 3-4 quarts a day. Cranberry juice is especially recommended in addition to large amounts of water. Avoid caffeine & carbonated drinks, they tend to irritate the bladder, Return in 3-5 days if you're not better: sooner if you're feeling worse.  Prescriptions: PYRIDIUM 200 MG TABS (PHENAZOPYRIDINE HCL) 1 by mouth three times a day  #6 x 0   Entered and Authorized by:   Loreen Freud DO   Signed by:   Loreen Freud DO on 11/18/2009   Method used:   Electronically to        [facility]  #[id number]* (retail)       [address] [place], [place]  [address]       Ph: [phone]       Fax: [phone]   RxID:   [id number] CIPRO 500 MG TABS (CIPROFLOXACIN HCL) 1 by mouth two times a day  #10 x 0   Entered and Authorized by:   Loreen Freud DO   Signed by:   Loreen Freud DO on 11/18/2009   Method used:   Electronically to        [facility] #[id number]* (retail)       [address] [place], [place]  [address]       Ph: [phone]       Fax: [phone]   RxID:   [id number]   Laboratory Results   Urine Tests    Routine Urinalysis   Color: straw Appearance: Hazy Glucose: negative   (Normal Range: Negative) Bilirubin: negative   (Normal Range: Negative) Ketone: negative   (Normal Range: Negative) Spec. Gravity: <1.005   (Normal Range: 1.003-1.035) Blood: negative   (Normal Range: Negative) pH: 7.5   (Normal Range:  5.0-8.0) Protein: negative   (Normal Range: Negative) Urobilinogen: 0.2   (Normal Range: 0-1) Nitrite: positive   (Normal Range: Negative) Leukocyte Esterace: trace   (Normal Range: Negative)    Comments: Pt has been taking Azo

## 2010-03-15 NOTE — Letter (Signed)
Summary: [facility] Instructions  [facility]  [address] [place], [place] [address]   Phone: [phone]  Fax: [phone]       BERNADETTE GORES    Nov 25, 1945    MRN: [id number]        Procedure Day /Date:wednesday 07/21/2009     Arrival Time:9AM     Procedure Time:10AM     Location of Procedure:                    X   [facility] (4th Floor)  PREPARATION FOR COLONOSCOPY WITH MOVIPREP   Starting 5 days prior to your procedure 07/16/2009 do not eat nuts, seeds, popcorn, corn, beans, peas,  salads, or any raw vegetables.  Do not take any fiber supplements (e.g. Metamucil, Citrucel, and Benefiber).  THE DAY BEFORE YOUR PROCEDURE         DATE:07/20/2009  DAY: TUESDAY 1.  Drink clear liquids the entire day-NO SOLID FOOD  2.  Do not drink anything colored red or purple.  Avoid juices with pulp.  No orange juice.  3.  Drink at least 64 oz. (8 glasses) of fluid/clear liquids during the day to prevent dehydration and help the prep work efficiently.  CLEAR LIQUIDS INCLUDE: Water Jello Ice Popsicles Tea (sugar ok, no milk/cream) Powdered fruit flavored drinks Coffee (sugar ok, no milk/cream) Gatorade Juice: apple, white grape, white cranberry  Lemonade Clear bullion, consomm, broth Carbonated beverages (any kind) Strained chicken noodle soup Hard Candy                             4.  In the morning, mix first dose of MoviPrep solution:    Empty 1 Pouch A and 1 Pouch B into the disposable container    Add lukewarm drinking water to the top line of the container. Mix to dissolve    Refrigerate (mixed solution should be used within 24 hrs)  5.  Begin drinking the prep at 5:00 p.m. The MoviPrep container is divided by 4 marks.   Every 15 minutes drink the solution down to the next mark (approximately 8 oz) until the full liter is complete.   6.  Follow completed prep with 16 oz of clear liquid of your choice (Nothing red or purple).  Continue to  drink clear liquids until bedtime.  7.  Before going to bed, mix second dose of MoviPrep solution:    Empty 1 Pouch A and 1 Pouch B into the disposable container    Add lukewarm drinking water to the top line of the container. Mix to dissolve    Refrigerate  THE DAY OF YOUR PROCEDURE      DATE: 07/21/2009 DAY: WEDNESDAY  Beginning at 5a.m. (5 hours before procedure):         1. Every 15 minutes, drink the solution down to the next mark (approx 8 oz) until the full liter is complete.  2. Follow completed prep with 16 oz. of clear liquid of your choice.    3. You may drink clear liquids until 8AM (2 HOURS BEFORE PROCEDURE).   MEDICATION INSTRUCTIONS  Unless otherwise instructed, you should take regular prescription medications with a small sip of water   as early as possible the morning of your procedure.        OTHER INSTRUCTIONS  You will need a responsible adult at least 65 years of age to accompany you and drive you home.  This person must remain in the waiting room during your procedure.  Wear loose fitting clothing that is easily removed.  Leave jewelry and other valuables at home.  However, you may wish to bring a book to read or  an iPod/MP3 player to listen to music as you wait for your procedure to start.  Remove all body piercing jewelry and leave at home.  Total time from sign-in until discharge is approximately 2-3 hours.  You should go home directly after your procedure and rest.  You can resume normal activities the  day after your procedure.  The day of your procedure you should not:   Drive   Make legal decisions   Operate machinery   Drink alcohol   Return to work  You will receive specific instructions about eating, activities and medications before you leave.    The above instructions have been reviewed and explained to me by   _______________________    I fully understand and can verbalize these instructions  _____________________________ Date _________

## 2010-03-15 NOTE — Assessment & Plan Note (Signed)
Summary: 1 MO F/U    History of Present Illness Visit Type: Follow-up Visit Primary GI MD: Melvia Heaps MD [facility] Primary Provider: [name], MD Requesting Provider: n/a Chief Complaint: F/u from colon. Pt states she is having a  hemorrhoid flare and needs more Anamantle and needs Nexium  History of Present Illness:   Connie Owens has returned for followup of her hemorrhoids.  A thrombosed hemorrhoid was excised.  She has frequent loose stools daily and complains of burning rectal discomfort.  This has been helped with topical hemorrhoidal ointments.  Over the past year she has had worsening diarrhea.  At this point she has at least 4 bowel movements a day which are loose and occasionally accompanied by urgency.  Stools are slightly more firm with Metamucil.  She does not awaken to have a bowel movement.  Colonoscopy earlier this month demonstrated a small adenomatous polyp.   GI Review of Systems      Denies abdominal pain, acid reflux, belching, bloating, chest pain, dysphagia with liquids, dysphagia with solids, heartburn, loss of appetite, nausea, vomiting, vomiting blood, weight loss, and  weight gain.      Reports hemorrhoids.     Denies anal fissure, black tarry stools, change in bowel habit, constipation, diarrhea, diverticulosis, fecal incontinence, heme positive stool, irritable bowel syndrome, jaundice, light color stool, liver problems, rectal bleeding, and  rectal pain.    Current Medications (verified): 1)  Estradiol 1 Mg Tabs (Estradiol) .... Once Daily 2)  Gabapentin 100 Mg  Caps (Gabapentin) .[name].. 1 -3 Every 8 Hrs As Needed 3)  Vitamin Packet 4)  Nexium 40 Mg Cpdr (Esomeprazole Magnesium) .[name].. 1 Tablet By Mouth Once Daily 30 Min Before First Meal 5)  Voltaren 1 % Gel (Diclofenac Sodium) .... Apply Two Times A Day As Needed 6)  Anamantle Hc 3-0.5 % Kit (Lidocaine-Hydrocortisone Ace) .... One Pr Q.h.s. 7)  Losartan Potassium 100 Mg Tabs (Losartan Potassium) .[name].. 1 Once  Daily 8)  Ambien Cr 12.5 Mg Cr-Tabs (Zolpidem Tartrate) .[name].. 1 By Mouth At Bedtime  Allergies (verified): 1)  ! Codeine 2)  ! * Iodine Contrast 3)  ! Verapamil Hcl (Verapamil Hcl)  Past History:  Past Medical History: Reviewed history from 02/02/2009 and no changes required. GERD (ICD-530.81) DIVERTICULAR DISEASE (ICD-562.10) Hx of ADENOMATOUS COLONIC POLYP (ICD-211.3) - 1999 ARTHRITIS (ICD-716.90) HIATAL HERNIA (ICD-553.3) ANXIETY STATE NOS (ICD-300.00) HEADACHE (ICD-784.0) HYPERTENSION, ESSENTIAL NOS (ICD-401.9) CHEST PAIN (ICD-786.50) ABDOMINAL PAIN, CHRONIC (ICD-789.00) SYMPTOM, HEADACHE (ICD-784.0)  Past Surgical History: Reviewed history from 02/02/2009 and no changes required. Corneal transplants bilat Colon polypectomy Lumbar spur removed Appendectomy Cholecystectomy Hysterectomy Bunionectomy, knee surgery , ankle cystectomy Lumpectomy Endo 10/08 : neg Dr [name] No PMH of perioperative complications except post op nausea Knee Replacement Rectocele surgery  Family History: Reviewed history from 06/21/2009 and no changes required. diabetes on the paternal side of the family (grandfather) 3 sisters diabetes , 1 CABG maternal side thyroid problems father heart disease , CAD, CVA, dementia  sister breast cancer, PTE pre & post Dx of CA mother positive for breast cancer No FH of Colon Cancer:  Social History: Reviewed history from 02/02/2009 and no changes required. Never Smoked Alcohol use-yes rarely Regular exercise-yes Occupation: Retired Daily Caffeine Use  Review of Systems  The patient denies allergy/sinus, anemia, anxiety-new, arthritis/joint pain, back pain, blood in urine, breast changes/lumps, change in vision, confusion, cough, coughing up blood, depression-new, fainting, fatigue, fever, headaches-new, hearing problems, heart murmur, heart rhythm changes, itching, menstrual pain, muscle pains/cramps,  night sweats, nosebleeds, pregnancy symptoms,  shortness of breath, skin rash, sleeping problems, sore throat, swelling of feet/legs, swollen lymph glands, thirst - excessive , urination - excessive , urination changes/pain, urine leakage, vision changes, and voice change.    Vital Signs:  Patient profile:   65 year old female Height:      65 inches Weight:      224 pounds BMI:     37.41 BSA:     2.08 Pulse rate:   62 / minute Pulse rhythm:   regular BP sitting:   132 / 68  (left arm) Cuff size:   large  Vitals Entered By: Ok Anis CMA (August 09, 2009 1:55 PM)   Impression & Recommendations:  Problem # 1:  HEMORRHOIDS-INTERNAL (ICD-455.0) The patient has both internal and external hemorrhoids.  Symptoms are exacerbated by her loose stools.  Recommendations #1 Anusol HC suppositories and cream #2 Immodium forr diarrhea.  She will consider enrollment and IBS trial.  Problem # 2:  Hx of ADENOMATOUS COLONIC POLYP (ICD-211.3) Plan followup colonoscopy in 5 years  Patient Instructions: 1)  Copy sent to : Wiliam Hopper,MD Prescriptions: NEXIUM 40 MG CPDR (ESOMEPRAZOLE MAGNESIUM) 1 tablet by mouth once daily 30 min before first meal  #31.0 Each x 5   Entered and Authorized by:   Louis Meckel MD   Signed by:   Louis Meckel MD on 08/09/2009   Method used:   Electronically to        [facility] #[id number]* (retail)       [address] [place], [place]  [address]       Ph: [phone]       Fax: [phone]   RxID:   [id number] ANUSOL-HC 2.5 % CREA (HYDROCORTISONE) apply after each bowel movement  #1 x 1   Entered and Authorized by:   Louis Meckel MD   Signed by:   Louis Meckel MD on 08/09/2009   Method used:   Electronically to        [facility] #[id number]* (retail)       [address] [place], [place]  [address]       Ph: [phone]       Fax: [phone]   RxID:   [id number] ANUSOL-HC 25 MG SUPP (HYDROCORTISONE ACETATE) take one PR q.h.s.  #10 x 2   Entered and  Authorized by:   Louis Meckel MD   Signed by:   Louis Meckel MD on 08/09/2009   Method used:   Electronically to        [facility] #[id number]* (retail)       [address] [place], [place]  [address]       Ph: [phone]       Fax: [phone]   RxID:   [id number]

## 2010-03-15 NOTE — Assessment & Plan Note (Signed)
Summary: UTI??//lch   Vital Signs:  Patient profile:   65 year old female Weight:      230 pounds Temp:     98.7 degrees F oral Pulse rate:   76 / minute Resp:     17 per minute BP sitting:   122 / 80  (left arm)  Vitals Entered By: Jeremy Johann CMA (May 11, 2009 12:38 PM) CC: UTI, hemorrhoid flare up Comments REVIEWED MED LIST, PATIENT AGREED DOSE AND INSTRUCTION CORRECT    Primary Care Provider:  [name], MD  CC:  UTI and hemorrhoid flare up.  History of Present Illness: Onset as dysuria & frequency on  05/08/2009; improvement with Azo. She questions UTI due to hemorrhoid flare; she is using rectal hygiene with Sitz baths , Witch Hazel cleansing & Prep H. PMH of coccyx arthritis with pain; Gabapentin helps.  Allergies: 1)  ! Codeine 2)  ! * Iodine Contrast 3)  ! Verapamil Hcl (Verapamil Hcl)  Review of Systems General:  Denies chills, fever, and sweats. GI:  Complains of hemorrhoids; denies bloody stools and dark tarry stools. GU:  Denies discharge, hematuria, incontinence, and urinary hesitancy.  Physical Exam  General:  in no acute distress; alert,appropriate and cooperative throughout examination Abdomen:  Bowel sounds positive,abdomen soft and non-tender without masses, organomegaly or hernias noted. Msk:  No flank tenderness ; sat up w/o help Extremities:  Neg SLR Neurologic:  strength normal in lower  extremities.   Skin:  Intact without suspicious lesions or rashes   Impression & Recommendations:  Problem # 1:  DYSURIA (ICD-788.1)  Orders: UA Dipstick w/o Micro (manual) ([address]) T-Culture, Urine ([id number])  Her updated medication list for this problem includes:    Nitrofurantoin Monohyd Macro 100 Mg Caps (Nitrofurantoin monohyd macro) .[name]... 1 two times a day  Problem # 2:  COCCYGEAL PAIN (ICD-724.79)  Problem # 3:  HEMORRHOIDS (ICD-455.6)  Orders: Gastroenterology Referral (GI)  Complete Medication List: 1)  Estradiol 1 Mg Tabs  (Estradiol) .... Once daily 2)  Gabapentin 100 Mg Caps (Gabapentin) .[name].. 1 -3 every 8 hrs as needed 3)  Vitamin Packet  4)  Nexium 40 Mg Cpdr (Esomeprazole magnesium) .[name].. 1 tablet by mouth once daily 30 min before first meal 5)  Voltaren 1 % Gel (Diclofenac sodium) .... Apply two times a day as needed 6)  Anamantle Hc 3-0.5 % Kit (Lidocaine-hydrocortisone ace) .... One pr q.h.s. 7)  Losartan Potassium 100 Mg Tabs (Losartan potassium) .[name].. 1 once daily 8)  Nitrofurantoin Monohyd Macro 100 Mg Caps (Nitrofurantoin monohyd macro) .[name].. 1 two times a day 9)  Proctofoam 1 % Foam (Pramoxine hcl) .... Apply three times a day as needed after sitz baths  Patient Instructions: 1)  Rectal Hygiene as discussed. 2)  Drink as much fluid as you can tolerate for the next few days. Prescriptions: PROCTOFOAM 1 % FOAM (PRAMOXINE HCL) apply three times a day as needed after Sitz baths  #1 dispenser x 1   Entered and Authorized by:   [name] MD   Signed by:   [name] MD on 05/11/2009   Method used:   Print then Give to Patient   RxID:   [id number] NITROFURANTOIN MONOHYD MACRO 100 MG CAPS (NITROFURANTOIN MONOHYD MACRO) 1 two times a day  #20 x 0   Entered and Authorized by:   [name] MD   Signed by:   [name] MD on 05/11/2009   Method used:   Print then Give to Patient  RxID:   [id number] GABAPENTIN 100 MG  CAPS (GABAPENTIN) 1 -3 every 8 hrs as needed  #90 x 1   Entered and Authorized by:   [name] MD   Signed by:   [name] MD on 05/11/2009   Method used:   Print then Give to Patient   RxID:   [id number]   Laboratory Results   Urine Tests   Date/Time Reported: May 11, 2009 12:44 PM  Routine Urinalysis   Color: yellow Appearance: Clear Glucose: negative   (Normal Range: Negative) Bilirubin: negative   (Normal Range: Negative) Ketone: negative   (Normal Range: Negative) Spec. Gravity: <1.005   (Normal Range: 1.003-1.035) Blood:  negative   (Normal Range: Negative) pH: 5.0   (Normal Range: 5.0-8.0) Protein: negative   (Normal Range: Negative) Urobilinogen: 0.2   (Normal Range: 0-1) Nitrite: negative   (Normal Range: Negative) Leukocyte Esterace: negative   (Normal Range: Negative)

## 2010-03-15 NOTE — Letter (Signed)
Summary: Patient Notice- Polyp Results  [facility]  [address] [place], [place] [address]   Phone: [phone]  Fax: [phone]        July 23, 2009 MRN: [id number]    CARLING LIBERMAN [address] [place], [place]  [address]    Dear Ms. Lawyer,  I am pleased to inform you that the colon polyp(s) removed during your recent colonoscopy was (were) found to be benign (no cancer detected) upon pathologic examination.  I recommend you have a repeat colonoscopy examination in 5_ years to look for recurrent polyps, as having colon polyps increases your risk for having recurrent polyps or even colon cancer in the future.  Should you develop new or worsening symptoms of abdominal pain, bowel habit changes or bleeding from the rectum or bowels, please schedule an evaluation with either your primary care physician or with me.  Additional information/recommendations:  __ No further action with gastroenterology is needed at this time. Please      follow-up with your primary care physician for your other healthcare      needs.  __ Please call [phone] to schedule a return visit to review your      situation.  __ Please keep your follow-up visit as already scheduled.  _x_ Continue treatment plan as outlined the day of your exam.  Please call us if you are having persistent problems or have questions about your condition that have not been fully answered at this time.  Sincerely,  Louis Meckel MD  This letter has been electronically signed by your physician.  Appended Document: Patient Notice- Polyp Results letter mailed.

## 2010-03-15 NOTE — Assessment & Plan Note (Signed)
Summary: painful Hemmoroids- jr   Vital Signs:  Patient profile:   65 year old female Weight:      227.2 pounds Temp:     98.9 degrees F oral Pulse rate:   64 / minute Resp:     16 per minute BP sitting:   126 / 86  (left arm) Cuff size:   large  Vitals Entered By: Shonna Chock (March 17, 2009 3:40 PM) CC: Painful Hemmorhoids Comments REVIEWED MED LIST, PATIENT AGREED DOSE AND INSTRUCTION CORRECT    Primary Care Provider:  [name], MD  CC:  Painful Hemmorhoids.  History of Present Illness: Rectal pain since 09/2008; Dr [name] Rxed suppositories with decreased pain & swelling until flare in 01/2009." GI subbing for Dr [name] stated pain not due to hemorrhoids". Pain med Rxed; referral to "medical doctor or Orthopedist for trailbone( etiology)". Colonoscopy due 2012.Dr Marzetta Board 8/20 & Dr Enid Skeens 02/02/2009 notes reviewed. Pain now constant whether sitting or not as burning soreness @ anus; no better with topical agent, worse post BM. PMH of rectocoele repair by Gyn.No PMH of coccyx injury. Psoriasis treated by Dr Brayton Caves topical  cream  Allergies: 1)  ! Codeine 2)  ! * Iodine Contrast 3)  ! Verapamil Hcl (Verapamil Hcl) e Contrast 3)  ! Verapamil Hcl (Verapamil Hcl) [[id number]]  Review of Systems General:  Denies chills, fever, sweats, and weight loss. GI:  Denies abdominal pain, bloody stools, and dark tarry stools; No stool incontinence. GU:  Denies incontinence. Derm:  Complains of rash. Neuro:  Denies brief paralysis, numbness, tingling, and weakness.  Physical Exam  General:  well-nourished,in no acute distress; alert,appropriate and cooperative throughout examination Abdomen:  Bowel sounds positive,abdomen soft and non-tender without masses, organomegaly or hernias noted. Msk:  No  tenderness to percussion over LS spine; sat up w/o help Extremities:  Neg SLR bilaterally.Minor DJD of hands Neurologic:  strength normal in lower  extremities, gait normal,  and DTRs symmetrical and normal.   Skin:  Mild Psoriasis LS area Psych:  memory intact for recent and remote, normally interactive, good eye contact, and not anxious appearing.     Impression & Recommendations:  Problem # 1:  ANAL OR RECTAL PAIN (ICD-569.42)  constant burning , R/O S 3-4 sacral radiculopthy  Orders: T-Coccyx/Sacrum 2 Views (30160FU)  Complete Medication List: 1)  Estradiol 1 Mg Tabs (Estradiol) .... Once daily 2)  Gabapentin 100 Mg Caps (Gabapentin) .[name].. 1 -3 every 8 hrs as needed 3)  Vitamin Packet  4)  Nexium 40 Mg Cpdr (Esomeprazole magnesium) .[name].. 1 tablet by mouth once daily 30 min before first meal 5)  Voltaren 1 % Gel (Diclofenac sodium) .... Apply two times a day as needed 6)  Anamantle Hc 3-0.5 % Kit (Lidocaine-hydrocortisone ace) .... One pr q.h.s. 7)  Losartan Potassium 100 Mg Tabs (Losartan potassium) .[name].. 1 once daily  Patient Instructions: 1)  Sit on rubber donut to decrease pressure on coccyx. Titrate Gabapentin every 4-5 days as discussed Prescriptions: GABAPENTIN 100 MG  CAPS (GABAPENTIN) 1 -3 every 8 hrs as needed  #90 x 1   Entered and Authorized by:   [name] MD   Signed by:   [name] MD on 03/17/2009   Method used:   Faxed to ...       [facility] #[id number]* (retail)       [address]       [place], [place]  [address]  Ph: [phone]       Fax: [phone]   RxID:   [id number]

## 2010-03-15 NOTE — Progress Notes (Signed)
Summary: pneumo vacc  Phone Note Other Incoming Call back at [facility] Phone [phone]   Summary of Call: Patient was in office and inquired about pneumo vacc guidlines. Patient did receive flu shot,but had another appt to get to.  I will notify the patient that pneumo shots are to be given at 65 or older unless contributing condition (astha, COPD, etc). Lm with spouse for patient to call back to office.  Initial call taken by: Lucious Groves CMA,  November 16, 2009 1:49 PM  Follow-up for Phone Call        Patient was still sleeping, spouse will have her call the office. Lucious Groves CMA  November 17, 2009 9:00 AM   Patient notified and denies any contributing condition. She will do the vaccine when she turns 65. Lucious Groves CMA  November 18, 2009 2:27 PM

## 2010-03-15 NOTE — Miscellaneous (Signed)
Summary: canasa supp. order  Clinical Lists Changes  Medications: Added new medication of CANASA 1000 MG  SUPP (MESALAMINE) one rectally two times a day for 2 weeks then as needed - Signed Rx of CANASA 1000 MG  SUPP (MESALAMINE) one rectally two times a day for 2 weeks then as needed;  #60 x 0;  Signed;  Entered by: Alease Frame RN;  Authorized by: Meryl Dare MD [facility];  Method used: Electronically to [facility] #[id number]*, [address] [place], [place]  [address], Ph: [phone], Fax: [phone] Observations: Added new observation of ALLERGY REV: Done (01/21/2010 10:12)    Prescriptions: CANASA 1000 MG  SUPP (MESALAMINE) one rectally two times a day for 2 weeks then as needed  #60 x 0   Entered by:   Alease Frame RN   Authorized by:   Meryl Dare MD [facility]   Signed by:   Alease Frame RN on 01/21/2010   Method used:   Electronically to        [facility] #[id number]* (retail)       [address] [place], [place]  [address]       Ph: [phone]       Fax: [phone]   RxID:   [id number]

## 2010-03-15 NOTE — Progress Notes (Signed)
Summary: Medicine discontinued per pharmacy  Medications Added ANUSOL-HC 25 MG SUPP (HYDROCORTISONE ACETATE) 1 PR for 7 days       Phone Note Call from Patient Call back at [facility] Phone [phone]   Call For: Dr [name] Summary of Call: The medicine creme Dr ordered yesterday, pharmacy says it has been discontinued. What else can she try? Initial call taken by: Leanor Kail [facility],  Jun 22, 2009 4:30 PM  Follow-up for Phone Call        anusol hc supp #7 Follow-up by: Louis Meckel MD,  Jun 22, 2009 4:46 PM  Additional Follow-up for Phone Call Additional follow up Details #1::        Called pt to inform that new med was sent Additional Follow-up by: Merri Ray CMA [facility]),  Jun 23, 2009 8:27 AM    New/Updated Medications: ANUSOL-HC 25 MG SUPP (HYDROCORTISONE ACETATE) 1 PR for 7 days Prescriptions: ANUSOL-HC 25 MG SUPP (HYDROCORTISONE ACETATE) 1 PR for 7 days  #7 x 1   Entered by:   Merri Ray CMA (AAMA)   Authorized by:   Louis Meckel MD   Signed by:   Merri Ray CMA (AAMA) on 06/23/2009   Method used:   Electronically to        [facility] #[id number]* (retail)       [address] [place], [place]  [address]       Ph: [phone]       Fax: [phone]   RxID:   [id number]

## 2010-03-15 NOTE — Progress Notes (Signed)
Summary: Scheduling referral appt. to surgeon   Phone Note Call from Patient Call back at Home Phone [phone]   Caller: Patient Call For: Willette Cluster Reason for Call: Talk to Nurse Summary of Call: pt is calling about a referral to a surgeon that is suppose to be sch'd Initial call taken by: Karna Christmas,  May 14, 2009 12:49 PM  Follow-up for Phone Call        [facility]. please refer for surgical eval. Thanks Pr notified 05-12-09 that her appt is 05-17-09 with Dr. Bertram Savin. I called [facility] today, the pt did arrive for the appt.  Follow-up by: Joselyn Glassman,  May 18, 2009 11:20 AM

## 2010-03-15 NOTE — Assessment & Plan Note (Signed)
  Nurse Visit   Preventive Screening-Counseling & Management  Comments: Patient participated in [place] IBS-D study and completed treatment phase on 11/30/09.  Physical exam performed by Dr. [name] was normal, but rectal exam confirmed an anal fissure, anterior midline.  Dr.Kaplan prescribed Diltiazem Ointment and recommended Iodium for her IBS symptoms.  He asked that patient follow-up with him in one month.  This visit will be outside of study as she will have completed all phases.    Allergies: 1)  ! Codeine 2)  ! * Iodine Contrast 3)  ! Verapamil Hcl (Verapamil Hcl)

## 2010-03-15 NOTE — Letter (Signed)
Summary: [facility]  [address] [place], [place] [address]   Phone: [phone]  Fax: [phone]       HARSHIKA MAGO    1945-08-02    MRN: [id number]        Procedure Day /Date:FRIDAY 01/21/2010     Arrival Time:8AM     Procedure Time:9AM     Location of Procedure:                    X   [facility] (4th Floor)    PREPARATION FOR FLEXIBLE SIGMOIDOSCOPY WITH MAGNESIUM CITRATE  Prior to the day before your procedure, purchase one 8 oz. bottle of Magnesium Citrate and one Fleet Enema from the laxative section of your drugstore.  _________________________________________________________________________________________________  THE DAY BEFORE YOUR PROCEDURE             DATE: 01/20/2010   DAY: THURSDAY  1.   Have a clear liquid dinner the night before your procedure.  2.   Do not drink anything colored red or purple.  Avoid juices with pulp.  No orange juice.              CLEAR LIQUIDS INCLUDE: Water Jello Ice Popsicles Tea (sugar ok, no milk/cream) Powdered fruit flavored drinks Coffee (sugar ok, no milk/cream) Gatorade Juice: apple, white grape, white cranberry  Lemonade Clear bullion, consomm, broth Carbonated beverages (any kind) Strained chicken noodle soup Hard Candy   3.   At 7:00 pm the night before your procedure, drink one bottle of Magnesium Citrate over ice.  4.   Drink at least 3 more glasses of clear liquids before bedtime (preferably juices).  5.   Results are expected usually within 1 to 6 hours after taking the Magnesium Citrate.  ___________________________________________________________________________________________________  THE DAY OF YOUR PROCEDURE            DATE: 01/21/2010   DAY: FRIDAY  1.   Use Fleet Enema one hour prior to coming for procedure.  2.   You may drink clear liquids until 7AM (2 hours before exam)       MEDICATION INSTRUCTIONS  Unless otherwise instructed, you  should take regular prescription medications with a small sip of water as early as possible the morning of your procedure.  Diabetic patients - see separate instructions.        OTHER INSTRUCTIONS  You will need a responsible adult at least 65 years of age to accompany you and drive you home.   This person must remain in the waiting room during your procedure.  Wear loose fitting clothing that is easily removed.  Leave jewelry and other valuables at home.  However, you may wish to bring a book to read or an iPod/MP3 player to listen to music as you wait for your procedure to start.  Remove all body piercing jewelry and leave at home.  Total time from sign-in until discharge is approximately 2-3 hours.  You should go home directly after your procedure and rest.  You can resume normal activities the day after your procedure.  The day of your procedure you should not:   Drive   Make legal decisions   Operate machinery   Drink alcohol   Return to work  You will receive specific instructions about eating, activities and medications before you leave.   The above instructions have been reviewed and explained to me by   _______________________    I fully understand and can  verbalize these instructions _____________________________ Date _________

## 2010-03-15 NOTE — Progress Notes (Signed)
Summary: Culture Results  Phone Note Outgoing Call   Call placed by: Shonna Chock,  May 13, 2009 8:33 AM Call placed to: Patient Summary of Call: Spoke with patient, patient ok'd all information below:  Nitrofurantoin(Macrobid) has only INTERMEDIATE sensitivity for this bacteria . Change to Ciprofloxacin  500 mg two times a day #14  Chrae Malloy  May 13, 2009 8:37 AM     New/Updated Medications: CIPROFLOXACIN HCL 500 MG TABS (CIPROFLOXACIN HCL) 1 by mouth two times a day Prescriptions: CIPROFLOXACIN HCL 500 MG TABS (CIPROFLOXACIN HCL) 1 by mouth two times a day  #14 x 0   Entered by:   Shonna Chock   Authorized by:   [name] MD   Signed by:   Shonna Chock on 05/13/2009   Method used:   Electronically to        [facility] #[id number]* (retail)       [address] [place], [place]  [address]       Ph: [phone]       Fax: [phone]   RxID:   [id number]

## 2010-03-15 NOTE — Progress Notes (Signed)
Summary: TRIAGE-Hemorrhoids   Phone Note From Other Clinic Call back at [phone]   Caller: Noelle Penner coor Call For: Dr. [name] Reason for Call: Schedule Patient Appt Summary of Call: Dr. Alwyn Ren would like pt seen before next available for flared hemorrhoids... please call Bradly Chris with a triage appt Initial call taken by: Vallarie Mare,  May 12, 2009 11:27 AM  Follow-up for Phone Call        Pt. will see Willette Cluster NP on 05-13-09 at 10:30am. Luster Landsberg will advise pt. of appt.  Follow-up by: Laureen Ochs LPN,  May 12, 2009 11:40 AM

## 2010-03-15 NOTE — Assessment & Plan Note (Signed)
Summary: follow up on hemorrhoids/lk    History of Present Illness Visit Type: Follow-up Visit Primary GI MD: Melvia Heaps MD [facility] Primary Provider: [name], MD Requesting Provider: n/a Chief Complaint: follow-up hemorrhoids History of Present Illness:   Mrs. Dexheimer has returned for ongoing evaluation of rectal pain.  Since her last visit she had a clot excised from a thrombosed hemorrhoid.  She underwent an examination under anesthesia and was dilated to 2 fingerbreaths.  Symptoms are greatly improved though she still has mild residual discomfort.   GI Review of Systems      Denies abdominal pain, acid reflux, belching, bloating, chest pain, dysphagia with liquids, dysphagia with solids, heartburn, loss of appetite, nausea, vomiting, vomiting blood, weight loss, and  weight gain.      Reports hemorrhoids and  rectal pain.     Denies anal fissure, black tarry stools, change in bowel habit, constipation, diarrhea, diverticulosis, fecal incontinence, heme positive stool, irritable bowel syndrome, jaundice, light color stool, liver problems, and  rectal bleeding.    Current Medications (verified): 1)  Estradiol 1 Mg Tabs (Estradiol) .... Once Daily 2)  Gabapentin 100 Mg  Caps (Gabapentin) .[name].. 1 -3 Every 8 Hrs As Needed 3)  Vitamin Packet 4)  Nexium 40 Mg Cpdr (Esomeprazole Magnesium) .[name].. 1 Tablet By Mouth Once Daily 30 Min Before First Meal 5)  Voltaren 1 % Gel (Diclofenac Sodium) .... Apply Two Times A Day As Needed 6)  Anamantle Hc 3-0.5 % Kit (Lidocaine-Hydrocortisone Ace) .... One Pr Q.h.s. 7)  Losartan Potassium 100 Mg Tabs (Losartan Potassium) .[name].. 1 Once Daily 8)  Vicodin 5-500 Mg Tabs (Hydrocodone-Acetaminophen) .... Take 1 Tab Every 6 Hours As Needed For Pain, Do Not Drive While Taking This Medication 9)  Ambien Cr 12.5 Mg Cr-Tabs (Zolpidem Tartrate) .[name].. 1 By Mouth At Bedtime  Allergies (verified): 1)  ! Codeine 2)  ! * Iodine Contrast 3)  ! Verapamil Hcl  (Verapamil Hcl)  Past History:  Past Medical History: Reviewed history from 02/02/2009 and no changes required. GERD (ICD-530.81) DIVERTICULAR DISEASE (ICD-562.10) Hx of ADENOMATOUS COLONIC POLYP (ICD-211.3) - 1999 ARTHRITIS (ICD-716.90) HIATAL HERNIA (ICD-553.3) ANXIETY STATE NOS (ICD-300.00) HEADACHE (ICD-784.0) HYPERTENSION, ESSENTIAL NOS (ICD-401.9) CHEST PAIN (ICD-786.50) ABDOMINAL PAIN, CHRONIC (ICD-789.00) SYMPTOM, HEADACHE (ICD-784.0)  Past Surgical History: Reviewed history from 02/02/2009 and no changes required. Corneal transplants bilat Colon polypectomy Lumbar spur removed Appendectomy Cholecystectomy Hysterectomy Bunionectomy, knee surgery , ankle cystectomy Lumpectomy Endo 10/08 : neg Dr [name] No PMH of perioperative complications except post op nausea Knee Replacement Rectocele surgery  Family History: Reviewed history from 01/15/2008 and no changes required. diabetes on the paternal side of the family (grandfather) 3 sisters diabetes , 1 CABG maternal side thyroid problems father heart disease , CAD, CVA, dementia  sister breast cancer, PTE pre & post Dx of CA mother positive for breast cancer No FH of Colon Cancer:  Social History: Reviewed history from 02/02/2009 and no changes required. Never Smoked Alcohol use-yes rarely Regular exercise-yes Occupation: Retired Daily Caffeine Use  Review of Systems       The patient complains of arthritis/joint pain, itching, skin rash, and sleeping problems.  The patient denies allergy/sinus, anemia, anxiety-new, back pain, blood in urine, breast changes/lumps, change in vision, confusion, cough, coughing up blood, depression-new, fainting, fatigue, fever, headaches-new, hearing problems, heart murmur, heart rhythm changes, muscle pains/cramps, night sweats, nosebleeds, shortness of breath, sore throat, swelling of feet/legs, swollen lymph glands, thirst - excessive, urination - excessive, urination  changes/pain, urine  leakage, vision changes, and voice change.    Vital Signs:  Patient profile:   65 year old female Height:      65 inches Weight:      223 pounds BMI:     37.24 Pulse rate:   64 / minute Pulse rhythm:   regular BP sitting:   118 / 72  (left arm)  Vitals Entered By: Milford Cage NCMA (Jun 21, 2009 1:52 PM)  Physical Exam  Additional Exam:  On physical exam there are small external hemorrhoidal tags.  There are no fissures   Impression & Recommendations:  Problem # 1:  HEMORRHOIDS-EXTERNAL (ICD-455.3)  Plan continue local therapy including suppositories, topicals and warm soaks.  Patient has not had a colonoscopy in 10 years and will be scheduled in several weeks.  Orders: Colonoscopy (Colon)  Patient Instructions: 1)  Copy sent to : Chrissie Noa Hopper,MD 2)                         Bertram Savin, MD 3)  Your Colonoscopy is scheduled for 07/21/2009 at 10am 4)  You can pick up your MoviPrep from your pharmacy today 5)  Warm soaks daily 6)  Stool softeners as needed  7)  Avoid spicy foods  8)  Please schedule a follow-up appointment in 1 month.  9)  The medication list was reviewed and reconciled.  All changed / newly prescribed medications were explained.  A complete medication list was provided to the patient / caregiver. Prescriptions: MOVIPREP 100 GM  SOLR (PEG-KCL-NACL-NASULF-NA ASC-C) As per prep instructions.  #1 x 0   Entered by:   Merri Ray CMA (AAMA)   Authorized by:   Louis Meckel MD   Signed by:   Merri Ray CMA (AAMA) on 06/21/2009   Method used:   Electronically to        [facility] #[id number]* (retail)       [address] [place], [place]  [address]       Ph: [phone]       Fax: [phone]   RxID:   [id number] ANAMANTLE HC 3-2.5 % KIT (LIDOCAINE-HYDROCORTISONE ACE) use as directed  #1 x 2   Entered and Authorized by:   Louis Meckel MD   Signed by:   Louis Meckel MD on 06/21/2009   Method used:    Electronically to        [facility] #[id number]* (retail)       [address] [place], [place]  [address]       Ph: [phone]       Fax: [phone]   RxID:   [id number]

## 2010-03-15 NOTE — Assessment & Plan Note (Signed)
Summary: 74M FU/YF    History of Present Illness Primary GI MD: Melvia Heaps MD [facility] Primary Provider: [name], MD Requesting Provider: n/a Chief Complaint: Chronic diarrhea with anal fissure burning and rectal pain. History of Present Illness:   Connie Owens has returned for reevaluation of diarrhea and rectal pain.  She may have up to 4-5 loose stools a day.  Often this is preceded by a solid stool.  She continues to have rectal pain and burning.  She was seen by general surgery where  fissures were not seen while examined under anesthesia.  A finger dilatation was done.  She has occasional leakage of stool.   GI Review of Systems      Denies abdominal pain, acid reflux, belching, bloating, chest pain, dysphagia with liquids, dysphagia with solids, heartburn, loss of appetite, nausea, vomiting, vomiting blood, weight loss, and  weight gain.      Reports anal fissure,diarrhea, and  rectal pain.     Denies black tarry stools, change in bowel habit, constipation, diverticulosis, fecal incontinence, heme positive stool, hemorrhoids, irritable bowel syndrome, jaundice, light color stool, liver problems, and  rectal bleeding.    Current Medications (verified): 1)  Estradiol 1 Mg Tabs (Estradiol) .... Once Daily 2)  Gabapentin 100 Mg  Caps (Gabapentin) .[name].. 1 -3 Every 8 Hrs As Needed 3)  Vitamin Packet 4)  Nexium 40 Mg Cpdr (Esomeprazole Magnesium) .[name].. 1 Tablet By Mouth Once Daily 30 Min Before First Meal 5)  Voltaren 1 % Gel (Diclofenac Sodium) .... Apply Two Times A Day As Needed 6)  Losartan Potassium 100 Mg Tabs (Losartan Potassium) .[name].. 1 Once Daily 7)  Ambien Cr 12.5 Mg Cr-Tabs (Zolpidem Tartrate) .[name].. 1 By Mouth At Bedtime 8)  Diltiazem 2% Gel .... Apply To Affected Area Two Times A Day  Allergies (verified): 1)  ! Codeine 2)  ! * Iodine Contrast 3)  ! Verapamil Hcl (Verapamil Hcl)  Past History:  Past Medical History: Reviewed history from 02/02/2009 and no changes  required. GERD (ICD-530.81) DIVERTICULAR DISEASE (ICD-562.10) Hx of ADENOMATOUS COLONIC POLYP (ICD-211.3) - 1999 ARTHRITIS (ICD-716.90) HIATAL HERNIA (ICD-553.3) ANXIETY STATE NOS (ICD-300.00) HEADACHE (ICD-784.0) HYPERTENSION, ESSENTIAL NOS (ICD-401.9) CHEST PAIN (ICD-786.50) ABDOMINAL PAIN, CHRONIC (ICD-789.00) SYMPTOM, HEADACHE (ICD-784.0)  Past Surgical History: Reviewed history from 02/02/2009 and no changes required. Corneal transplants bilat Colon polypectomy Lumbar spur removed Appendectomy Cholecystectomy Hysterectomy Bunionectomy, knee surgery , ankle cystectomy Lumpectomy Endo 10/08 : neg Dr [name] No PMH of perioperative complications except post op nausea Knee Replacement Rectocele surgery  Family History: Reviewed history from 06/21/2009 and no changes required. diabetes on the paternal side of the family (grandfather) 3 sisters diabetes , 1 CABG maternal side thyroid problems father heart disease , CAD, CVA, dementia  sister breast cancer, PTE pre & post Dx of CA mother positive for breast cancer No FH of Colon Cancer:  Social History: Reviewed history from 02/02/2009 and no changes required. Never Smoked Alcohol use-yes rarely Regular exercise-yes Occupation: Retired Daily Caffeine Use  Review of Systems  The patient denies allergy/sinus, anemia, anxiety-new, arthritis/joint pain, back pain, blood in urine, breast changes/lumps, change in vision, confusion, cough, coughing up blood, depression-new, fainting, fatigue, fever, headaches-new, hearing problems, heart murmur, heart rhythm changes, itching, menstrual pain, muscle pains/cramps, night sweats, nosebleeds, pregnancy symptoms, shortness of breath, skin rash, sleeping problems, sore throat, swelling of feet/legs, swollen lymph glands, thirst - excessive , urination - excessive , urination changes/pain, urine leakage, vision changes, and voice change.  Vital Signs:  Patient profile:   65 year  old female Height:      65 inches Weight:      222 pounds BMI:     37.08 Pulse rate:   70 / minute Pulse rhythm:   regular BP sitting:   130 / 78  (right arm) Cuff size:   regular  Vitals Entered By: Connie Owens CMA Connie Owens) (January 13, 2010 10:23 AM)  Physical Exam  Additional Exam:  On physical exam she is a heavyset female  On rectal exam there are external skin tags and a sentinel pile.  Anoscopic exam was performed.  No fissures were seen.  There were internal hemorrhoids.   Impression & Recommendations:  Problem # 1:  DIARRHEA, CHRONIC (ICD-787.91)  She  has diarrhea- predominant IBS.  Colonoscopy in June, 2011 did not demonstrate any abnormalities to explain diarrhea. A hyperplastic polyp was removed.  She has a history of an adenomatous polyp.  Microscopic colitis is a consideration.  Recommendations #1sigmoidoscopy with random biopsies  Orders: Flex with Sedation (Flex w/Sed)  Problem # 2:  ANAL OR RECTAL PAIN (NAT-557.32)  This continues to be a significant problem.  I cannot appreciate any anal fissure, per se.  If this is all due to hemorrhoidal disease she has been refractory to medical therapy.  She has both internal and external hemorrhoids.  Recommendations #1 referral to a colorectal surgeon for further evaluation and therapy.  The patient wishes to defer this until she is eligible for Medicare in March, 2012  Orders: Flex with Sedation (Flex w/Sed)  Patient Instructions: 1)  Copy sent to : [name], MD 2)  Your procedure is scheduled on 01/21/2010 at 9am 3)  Conscious Sedation brochure given.  4)  Colonoscopy and Flexible Sigmoidoscopy brochure given.  5)  The medication list was reviewed and reconciled.  All changed / newly prescribed medications were explained.  A complete medication list was provided to the patient / caregiver.

## 2010-03-15 NOTE — Assessment & Plan Note (Signed)
Summary: HEMORRHOIDS            (DR.KAPLAN PT.)           Connie Owens    History of Present Illness Visit Type: Follow-up Visit Primary GI MD: Melvia Heaps MD [facility] Primary Provider: [name], MD Requesting Provider: n/a Chief Complaint: C/o external  hemorrhoids, causing discomfort and pain History of Present Illness:   Here for recurrent rectal pain.  Saw Dr. [name] in August, treated for hemorrhoids. Taking sitz baths, using Prep H and steroid / lidocaine cream given by Dr. [name]. She continues to have significant rectal pain. No bleeding.  Hurts to defecate and sit. Using a donut seat. Has tiny amount of blood on tissue sometimes. She is absolutely not constipated, has soft-loose BM once daily, no straining. She is interested in banding or other options as hemorrhoidal pain is causing her significant pain and distress.    GI Review of Systems      Denies abdominal pain, acid reflux, belching, bloating, chest pain, dysphagia with liquids, dysphagia with solids, heartburn, loss of appetite, nausea, vomiting, vomiting blood, weight loss, and  weight gain.      Reports hemorrhoids, rectal bleeding, and  rectal pain.     Denies anal fissure, black tarry stools, change in bowel habit, constipation, diarrhea, diverticulosis, fecal incontinence, heme positive stool, irritable bowel syndrome, jaundice, light color stool, and  liver problems.    Current Medications (verified): 1)  Estradiol 1 Mg Tabs (Estradiol) .... Once Daily 2)  Gabapentin 100 Mg  Caps (Gabapentin) .[name].. 1 -3 Every 8 Hrs As Needed 3)  Vitamin Packet 4)  Nexium 40 Mg Cpdr (Esomeprazole Magnesium) .[name].. 1 Tablet By Mouth Once Daily 30 Min Before First Meal 5)  Voltaren 1 % Gel (Diclofenac Sodium) .... Apply Two Times A Day As Needed 6)  Anamantle Hc 3-0.5 % Kit (Lidocaine-Hydrocortisone Ace) .... One Pr Q.h.s. 7)  Losartan Potassium 100 Mg Tabs (Losartan Potassium) .[name].. 1 Once Daily 8)  Ciprofloxacin Hcl 500 Mg Tabs  (Ciprofloxacin Hcl) .[name].. 1 By Mouth Two Times A Day  Allergies (verified): 1)  ! Codeine 2)  ! * Iodine Contrast 3)  ! Verapamil Hcl (Verapamil Hcl)  Past History:  Past Medical History: Reviewed history from 02/02/2009 and no changes required. GERD (ICD-530.81) DIVERTICULAR DISEASE (ICD-562.10) Hx of ADENOMATOUS COLONIC POLYP (ICD-211.3) - 1999 ARTHRITIS (ICD-716.90) HIATAL HERNIA (ICD-553.3) ANXIETY STATE NOS (ICD-300.00) HEADACHE (ICD-784.0) HYPERTENSION, ESSENTIAL NOS (ICD-401.9) CHEST PAIN (ICD-786.50) ABDOMINAL PAIN, CHRONIC (ICD-789.00) SYMPTOM, HEADACHE (ICD-784.0)  Past Surgical History: Reviewed history from 02/02/2009 and no changes required. Corneal transplants bilat Colon polypectomy Lumbar spur removed Appendectomy Cholecystectomy Hysterectomy Bunionectomy, knee surgery , ankle cystectomy Lumpectomy Endo 10/08 : neg Dr [name] No PMH of perioperative complications except post op nausea Knee Replacement Rectocele surgery  Family History: Reviewed history from 01/15/2008 and no changes required. diabetes on the paternal side of the family (grandfather) 3 sisters diabetes , 1 CABG maternal side thyroid problems father heart disease , CAD, CVA, dementia  sister breast cancer, PTE pre & post Dx of CA mother positive for breast cancer  Social History: Reviewed history from 02/02/2009 and no changes required. Never Smoked Alcohol use-yes rarely Regular exercise-yes Occupation: Retired Daily Caffeine Use  Review of Systems       The patient complains of arthritis/joint pain.  The patient denies allergy/sinus, anemia, anxiety-new, back pain, blood in urine, breast changes/lumps, change in vision, confusion, cough, coughing up blood, depression-new, fainting, fatigue, fever, headaches-new, hearing problems,  heart murmur, heart rhythm changes, itching, menstrual pain, muscle pains/cramps, night sweats, nosebleeds, pregnancy symptoms, shortness of breath,  skin rash, sleeping problems, sore throat, swelling of feet/legs, swollen lymph glands, thirst - excessive , urination - excessive , urination changes/pain, urine leakage, vision changes, and voice change.    Vital Signs:  Patient profile:   65 year old female Height:      65 inches Weight:      231 pounds BMI:     38.58 BSA:     2.10 Pulse rate:   74 / minute Pulse rhythm:   regular BP sitting:   120 / 72  (left arm)  Vitals Entered By: Merri Ray CMA Duncan Dull) (May 13, 2009 10:42 AM)  Physical Exam  General:  Plesant, obese white female. Head:  Normocephalic and atraumatic. Eyes:  .Conjunctiva pink, no icterus.  Mouth:  No oral lesions. Tongue moist.  Lungs:  Clear throughout to auscultation. Heart:  Regular rate and rhythm; no murmurs, rubs,  or bruits. Abdomen:  soft, obese, non-tender, active bowel sounds. Rectal:  Several inflamed hemorrhoids, some reducible. A few small but very tender hemorrhoids under skin around anus. None of the hemorrhoids are thrombosed. Digital exam unremarkable.  Skin:  Intact without significant lesions or rashes. Cervical Nodes:  No significant cervical adenopathy. Psych:  Alert and cooperative. Normal mood and affect.   Impression & Recommendations:  Problem # 1:  HEMORRHOIDS-EXTERNAL (ICD-455.3) Several moderately inflamed, non-thrombosed hemorrhoids. Small but very tender hemorrhoids under skin around anus. Patient has been diligent with sitz baths, steroid creams and avoiding constipation. No significant bleeding, just pain.  Will send for surgical evaluation of hemorrhoids / pain. I will give her #15 Vicodin for severe pain.  Problem # 2:  ANAL OR RECTAL PAIN (NUU-725.36) Assessment: Comment Only At times it hasn't been clear if her rectal pain was related to hemorrhoids. At least on today's exam, it seems to be. Palpation of hemorrhoids causing significant pain and tenderness.  Other Orders: [facility]  ([facility])  Patient Instructions: 1)  We sent a perscription for Ultram to your pharmaccy. 2)  Willette Cluster NP will speak to Dr. [name] about a possible surgical referral to [facility].  We will call you later regarding that. 3)  Continue what you are doing for your hemorrhoids. You are doing a good job.  Contiune using Anamantle cream.  4)  Copy sent to :  [name], Md 5)  The medication list was reviewed and reconciled.  All changed / newly prescribed medications were explained.  A complete medication list was provided to the patient / caregiver. Prescriptions: VICODIN 5-500 MG TABS (HYDROCODONE-ACETAMINOPHEN) Take 1 tab every 6 hours as needed for pain, do not drive while taking this medication  #15 x 0   Entered by:   Lowry Ram NCMA   Authorized by:   Willette Cluster NP   Signed by:   Lowry Ram NCMA on 05/13/2009   Method used:   Printed then faxed to ...       [facility] [address] [id number]* (retail)       [address] [place], [place]  [address]       Ph: [phone]       Fax: [phone]   RxID:   [id number] ULTRAM 50 MG TABS (TRAMADOL HCL) Take 1 tab every 6 hours  #60 x 0   Entered by:   Lowry Ram NCMA   Authorized by:  Willette Cluster NP   Signed by:   Lowry Ram NCMA on 05/13/2009   Method used:   Electronically to        [id number]* (retail)       [address] [place], [place]  [address]       Ph: [phone]       Fax: [phone]   RxID:   [id number]

## 2010-03-15 NOTE — Assessment & Plan Note (Signed)
Summary: flu shot/cbs   Nurse Visit  CC: Flu shot./kb   Allergies: 1)  ! Codeine 2)  ! * Iodine Contrast 3)  ! Verapamil Hcl (Verapamil Hcl)  Orders Added: 1)  Admin 1st Vaccine [90471] 2)  Flu Vaccine 71yrs + [[address]]               Flu Vaccine Consent Questions     Do you have a history of severe allergic reactions to this vaccine? no    Any prior history of allergic reactions to egg and/or gelatin? no    Do you have a sensitivity to the preservative Thimersol? no    Do you have a past history of Guillan-Barre Syndrome? no    Do you currently have an acute febrile illness? no    Have you ever had a severe reaction to latex? no    Vaccine information given and explained to patient? yes    Are you currently pregnant? no    Lot Number:[id number]   Exp Date:08/13/2010   Site Given  Left Deltoid IMu

## 2010-03-15 NOTE — Progress Notes (Signed)
Summary: stopping med   Phone Note Call from Patient Call back at [place] Phone [phone]   Summary of Call: Pt left VM stating that she would like to discuss stopping GABAPENTIN 100 MG for about 14 weeks in order to conduct a IV study. pt would like to know if this would be ok with dr hopper and if there are any precaution she needs to take when stopping med. pls advise................Marland KitchenFelecia Deloach CMA  August 11, 2009 9:56 AM   Follow-up for Phone Call        OK to stop , but wean it. Ex : if on 2 three times a day decrease to 1 three times a day X 2 days before stopping altogether Follow-up by: [name] MD,  August 12, 2009 9:15 AM  Additional Follow-up for Phone Call Additional follow up Details #1::        left message to call office.............Marland KitchenFelecia Deloach CMA  August 12, 2009 3:54 PM    [facility] Barnie Mort  August 13, 2009 3:18 PM DISCUSS WITH PATIENT.........Marland KitchenFelecia Deloach CMA  August 18, 2009 9:39 AM

## 2010-03-15 NOTE — Procedures (Signed)
Summary: Colonoscopy  Patient: Connie Owens Note: All result statuses are Final unless otherwise noted.  Tests: (1) Colonoscopy (COL)   COL Colonoscopy           DONE     [facility]     [address]     [place], [place]  [address]           COLONOSCOPY PROCEDURE REPORT           PATIENT:  Connie Owens, Connie Owens  MR#:  [id number]     BIRTHDATE:  1945/07/19, 64 yrs. old  GENDER:  female           ENDOSCOPIST:  Barbette Hair. [name], MD     Referred by:           PROCEDURE DATE:  07/21/2009     PROCEDURE:  Colonoscopy with snare polypectomy     ASA CLASS:  Class II     INDICATIONS:  1) Routine Risk Screening           MEDICATIONS:   Fentanyl 100 mcg IV, Versed 10 mg IV           DESCRIPTION OF PROCEDURE:   After the risks benefits and     alternatives of the procedure were thoroughly explained, informed     consent was obtained.  Digital rectal exam was performed and     revealed no abnormalities.   The [id number] endoscope was     introduced through the anus and advanced to the cecum, which was     identified by both the appendix and ileocecal valve, without     limitations.  The quality of the prep was excellent, using     MoviPrep.  The instrument was then slowly withdrawn as the colon     was fully examined.     <<PROCEDUREIMAGES>>           FINDINGS:  A sessile polyp was found in the descending colon. It     was 3 mm in size. Polyp was snared without cautery. Retrieval was     successful (see image9). snare polyp  Internal hemorrhoids were     found (see image11).  This was otherwise a normal examination of     the colon (see image1, image2, image3, image6, image7, and     image10).   Retroflexed views in the rectum revealed no     abnormalities.    The time to cecum =  2.5  minutes. The scope was     then withdrawn (time =  7.25  min) from the patient and the     procedure completed.           COMPLICATIONS:  None           ENDOSCOPIC IMPRESSION:     1) 3 mm  sessile polyp in the descending colon     2) Internal hemorrhoids     3) Otherwise normal examination     RECOMMENDATIONS:     1) If the polyp(s) removed today are proven to be adenomatous     (pre-cancerous) polyps, you will need a repeat colonoscopy in 5     years. Otherwise you should continue to follow colorectal cancer     screening guidelines for "routine risk" patients with colonoscopy     in 10 years.           REPEAT EXAM:   You will receive a letter from Dr. [name] in 1-2  weeks, after reviewing the final pathology, with followup     recommendations.           ______________________________     Barbette Hair [name], MD           CC: Pecola Lawless, MD           n.     Rosalie Doctor:   Barbette Hair. Kaplan at 07/21/2009 10:41 AM           Erma Pinto, [phone]  Note: An exclamation mark (!) indicates a result that was not dispersed into the flowsheet. Document Creation Date: 07/21/2009 10:41 AM _______________________________________________________________________  (1) Order result status: Final Collection or observation date-time: 07/21/2009 10:36 Requested date-time:  Receipt date-time:  Reported date-time:  Referring Physician:   Ordering Physician: Melvia Heaps [phone]) Specimen Source:  Source: Launa Grill Order Number: [id number] Lab site:   Appended Document: Colonoscopy     Procedures Next Due Date:    Colonoscopy: 07/2014

## 2010-03-15 NOTE — Consult Note (Signed)
Summary: [facility]   Imported By: Lanelle Bal 06/11/2009 12:11:56  _____________________________________________________________________  External Attachment:    Type:   Image     Comment:   External Document

## 2010-03-15 NOTE — Letter (Signed)
Summary: [facility]   Imported By: Lester Sangrey 08/20/2009 09:48:41  _____________________________________________________________________  External Attachment:    Type:   Image     Comment:   External Document

## 2010-03-17 NOTE — Assessment & Plan Note (Signed)
Summary: FACIAL PRESSURE,CONGESTION/RH......   Vital Signs:  Patient profile:   65 year old female Height:      65 inches (165.10 cm) Weight:      221.25 pounds (100.57 kg) BMI:     36.95 Temp:     98.5 degrees F (36.94 degrees C) oral Resp:     15 per minute BP sitting:   130 / 88  (left arm) Cuff size:   regular  Vitals Entered By: Lucious Groves CMA (January 25, 2010 12:47 PM) CC: Cold/possible sinus inf since Thursday./kb, URI symptoms Comments Patient notes that she has been having sore throat, facial pressure, sinus congestion, fever, HA, and cough with green mucous production. She denies SOB and chest pain. Patient notes that Mucinex and Tylenol Allergy-Sinus have been very little help.    Primary Care Cheng Dec:  [name], MD  CC:  Cold/possible sinus inf since Thursday./kb and URI symptoms.  History of Present Illness:      This is a 65 year old woman who presents with RTI  symptoms.  The patient reports nasal congestion,slightly  purulent nasal discharge, sore throat, and productive cough, but denies earache.  Associated symptoms include fever of 101 degrees on 1211.  The patient denies dyspnea and wheezing.  The patient also reports headache & bilateral facial pain and tender adenopathy.  The patient denies the following risk factors for Strep sinusitis: tooth pain.  Rx: Mucinex , Tylenol Allergy & Sinus, Nyquil  Current Medications (verified): 1)  Estradiol 1 Mg Tabs (Estradiol) .... Once Daily 2)  Gabapentin 100 Mg  Caps (Gabapentin) .[name].. 1 -3 Every 8 Hrs As Needed 3)  Vitamin Packet 4)  Nexium 40 Mg Cpdr (Esomeprazole Magnesium) .[name].. 1 Tablet By Mouth Once Daily 30 Min Before First Meal 5)  Voltaren 1 % Gel (Diclofenac Sodium) .... Apply Two Times A Day As Needed 6)  Losartan Potassium 100 Mg Tabs (Losartan Potassium) .[name].. 1 Once Daily 7)  Ambien Cr 12.5 Mg Cr-Tabs (Zolpidem Tartrate) .[name].. 1 By Mouth At Bedtime 8)  Diltiazem 2% Gel .... Apply To Affected Area Two  Times A Day 9)  Canasa 1000 Mg  Supp (Mesalamine) .... One Rectally Two Times A Day For 2 Weeks Then As Needed  Allergies (verified): 1)  ! Codeine 2)  ! * Iodine Contrast 3)  ! Verapamil Hcl (Verapamil Hcl)  Physical Exam  General:  well-nourished,in no acute distress; alert,appropriate and cooperative throughout examination Ears:  External ear exam shows no significant lesions or deformities.  Otoscopic examination reveals clear canals, tympanic membranes are intact bilaterally without bulging, retraction, inflammation or discharge. Hearing is grossly normal bilaterally. Nose:  External nasal examination shows no deformity or inflammation. Nasal mucosa are pink and moist without lesions or exudates. Mouth:  Oral mucosa and oropharynx without lesions or exudates.  Teeth in good repair. Hoarse Lungs:  Normal respiratory effort, chest expands symmetrically. Lungs are clear to auscultation, no crackles or wheezes. Dry cough Cervical Nodes:  No lymphadenopathy noted Axillary Nodes:  No palpable lymphadenopathy   Impression & Recommendations:  Problem # 1:  BRONCHITIS-ACUTE (ICD-466.0)  Her updated medication list for this problem includes:    Amoxicillin 500 Mg Caps (Amoxicillin) .[name]... 1 three times a day    Benzonatate 200 Mg Caps (Benzonatate) .[name]... 1 every 6-8 hrs as needed for cough  Problem # 2:  URI (ICD-465.9)  Her updated medication list for this problem includes:    Benzonatate 200 Mg Caps (Benzonatate) .[name]... 1 every 6-8  hrs as needed for cough  Complete Medication List: 1)  Estradiol 1 Mg Tabs (Estradiol) .... Once daily 2)  Gabapentin 100 Mg Caps (Gabapentin) .[name].. 1 -3 every 8 hrs as needed 3)  Vitamin Packet  4)  Nexium 40 Mg Cpdr (Esomeprazole magnesium) .[name].. 1 tablet by mouth once daily 30 min before first meal 5)  Voltaren 1 % Gel (Diclofenac sodium) .... Apply two times a day as needed 6)  Losartan Potassium 100 Mg Tabs (Losartan potassium) .[name].. 1 once daily 7)   Ambien Cr 12.5 Mg Cr-tabs (Zolpidem tartrate) .[name].. 1 by mouth at bedtime 8)  Diltiazem 2% Gel  .... Apply to affected area two times a day 9)  Canasa 1000 Mg Supp (Mesalamine) .... One rectally two times a day for 2 weeks then as needed 10)  Amoxicillin 500 Mg Caps (Amoxicillin) .[name].. 1 three times a day 11)  Benzonatate 200 Mg Caps (Benzonatate) .[name].. 1 every 6-8 hrs as needed for cough  Patient Instructions: 1)  Neti pot once daily - two times a day as needed sinus congestion  . 2)  Drink as much NON dairy  fluid as you can tolerate for the next few days. Prescriptions: BENZONATATE 200 MG CAPS (BENZONATATE) 1 every 6-8 hrs as needed for cough  #15 x 0   Entered and Authorized by:   [name] MD   Signed by:   [name] MD on 01/25/2010   Method used:   Faxed to ...       [facility] [address] #[id number]* (retail)       [address] [place], [place]  [address]       Ph: [phone]       Fax: [phone]   RxID:   [id number] AMOXICILLIN 500 MG CAPS (AMOXICILLIN) 1 three times a day  #30 x 0   Entered and Authorized by:   [name] MD   Signed by:   [name] MD on 01/25/2010   Method used:   Faxed to ...       [facility] [address] #[id number]* (retail)       [address] [place], [place]  [address]       Ph: [phone]       Fax: [phone]   RxID:   [id number]    Orders Added: 1)  Est. Patient Level III [[address]]

## 2010-03-17 NOTE — Letter (Signed)
Summary: Patient Notice- Colon Biospy Results  [facility]  [address] [place], [place] [address]   Phone: [phone]  Fax: [phone]        January 24, 2010 MRN: [id number]    ROHINI JAROSZEWSKI [address] [place], [place]  [address]    Dear Ms. Sweney,  I am pleased to inform you that the biopsies taken during your recent sigmoidoscopy did not show any evidence of cancer upon pathologic examination. The biopsies were normal.  Please call [phone] to schedule a return visit to review      your condition with Dr. [name].  Continue with the treatment plan as outlined on the day of your      exam.  Please call us if you are having persistent problems or have questions about your condition that have not been fully answered at this time.  Sincerely,  Meryl Dare MD [facility]  This letter has been electronically signed by your physician.  Appended Document: Patient Notice- Colon Biospy Results Letter Mailed

## 2010-03-17 NOTE — Progress Notes (Signed)
Summary: medication questions  Medications Added NEXIUM 40 MG CPDR (ESOMEPRAZOLE MAGNESIUM) Take 1 by mouth daily       Phone Note Call from Patient Call back at Home Phone [phone]   Caller: Patient Call For: Dr. [name] Reason for Call: Talk to Nurse Summary of Call: has questions about medication we called in Initial call taken by: [place] Johnson,  March 07, 2010 10:44 AM  Follow-up for Phone Call        Patient needed her Nexium prescription refilled, the yearly rx has expired.  Follow-up by: Selinda Michaels RN,  March 07, 2010 1:27 PM    New/Updated Medications: NEXIUM 40 MG CPDR (ESOMEPRAZOLE MAGNESIUM) Take 1 by mouth daily Prescriptions: NEXIUM 40 MG CPDR (ESOMEPRAZOLE MAGNESIUM) Take 1 by mouth daily  #30 x 12   Entered by:   Selinda Michaels RN   Authorized by:   Louis Meckel MD   Signed by:   Selinda Michaels RN on 03/07/2010   Method used:   Electronically to        [facility] [address] #[id number]* (retail)       [address] [place], [place]  [address]       Ph: [phone]       Fax: [phone]   RxID:   [id number]

## 2010-03-17 NOTE — Assessment & Plan Note (Signed)
Summary: FLX F/U.Marland KitchenMarland KitchenAS.    History of Present Illness Visit Type: Follow-up Visit Primary GI MD: Melvia Heaps MD [facility] Primary Provider: [name], MD Requesting Provider: n/a Chief Complaint: F/u from flex sig, diarrhea, and rectal pain. Pt states that she is still having diarrhea and rectal pain  History of Present Illness:   Connie Owens has returned for followup of her IBS.  Flexible sigmoidoscopy and biopsies were negative.  She continues to have 4-5 stools daily.  They are loose and accompanied by urgency.  She also complains of rectal discomfort.   GI Review of Systems      Denies abdominal pain, acid reflux, belching, bloating, chest pain, dysphagia with liquids, dysphagia with solids, heartburn, loss of appetite, nausea, vomiting, vomiting blood, weight loss, and  weight gain.      Reports diarrhea and  hemorrhoids.     Denies anal fissure, black tarry stools, change in bowel habit, constipation, diverticulosis, fecal incontinence, heme positive stool, irritable bowel syndrome, jaundice, light color stool, liver problems, rectal bleeding, and  rectal pain.    Current Medications (verified): 1)  Estradiol 1 Mg Tabs (Estradiol) .... Once Daily 2)  Gabapentin 100 Mg  Caps (Gabapentin) .[name].. 1 -3 Every 8 Hrs As Needed 3)  Vitamin Packet .... As Directed 4)  Nexium 40 Mg Cpdr (Esomeprazole Magnesium) .[name].. 1 Tablet By Mouth Once Daily 30 Min Before First Meal 5)  Voltaren 1 % Gel (Diclofenac Sodium) .... Apply Two Times A Day As Needed 6)  Losartan Potassium 100 Mg Tabs (Losartan Potassium) .[name].. 1 Once Daily 7)  Ambien Cr 12.5 Mg Cr-Tabs (Zolpidem Tartrate) .[name].. 1 By Mouth At Bedtime 8)  Canasa 1000 Mg  Supp (Mesalamine) .... One Rectally Two Times A Day For 2 Weeks Then As Needed  Allergies (verified): 1)  ! Codeine 2)  ! * Iodine Contrast 3)  ! Verapamil Hcl (Verapamil Hcl)  Past History:  Past Medical History: URI (ICD-465.9) DIARRHEA, CHRONIC (ICD-787.91) UTI  (ICD-599.0) HEMORRHOIDS-INTERNAL (ICD-455.0) HEMORRHOIDS-EXTERNAL (ICD-455.3) COCCYGEAL PAIN (ICD-724.79) ANAL OR RECTAL PAIN (ICD-569.42) RECTAL PAIN (ICD-569.42) HIP PAIN, LEFT (ICD-719.45) CORNEAL DISORDER (ICD-371.9) ADVERSE DRUG REACTION (ICD-995.20) POSTMENOPAUSAL STATUS (ICD-627.2) HEMORRHOIDS (ICD-455.6) DYSPHAGIA UNSPECIFIED (ICD-787.20) BACK PAIN (ICD-724.5) DYSMETABOLIC SYNDROME X (ICD-277.7) OSTEOARTHRITIS, KNEE, RIGHT (ICD-715.96) CARPAL TUNNEL SYNDROME (ICD-354.0) GERD (ICD-530.81) DIVERTICULAR DISEASE (ICD-562.10) Hx of ADENOMATOUS COLONIC POLYP (ICD-211.3) HIATAL HERNIA (ICD-553.3) ANXIETY STATE NOS (ICD-300.00) HYPERTENSION, ESSENTIAL NOS (ICD-401.9)  Past Surgical History: Reviewed history from 02/02/2009 and no changes required. Corneal transplants bilat Colon polypectomy Lumbar spur removed Appendectomy Cholecystectomy Hysterectomy Bunionectomy, knee surgery , ankle cystectomy Lumpectomy Endo 10/08 : neg Dr [name] No PMH of perioperative complications except post op nausea Knee Replacement Rectocele surgery  Family History: Reviewed history from 06/21/2009 and no changes required. diabetes on the paternal side of the family (grandfather) 3 sisters diabetes , 1 CABG maternal side thyroid problems father heart disease , CAD, CVA, dementia  sister breast cancer, PTE pre & post Dx of CA mother positive for breast cancer No FH of Colon Cancer:  Social History: Reviewed history from 02/02/2009 and no changes required. Never Smoked Alcohol use-yes rarely Regular exercise-yes Occupation: Retired Daily Caffeine Use  Review of Systems       The patient complains of fatigue.  The patient denies allergy/sinus, anemia, anxiety-new, arthritis/joint pain, back pain, blood in urine, breast changes/lumps, change in vision, confusion, cough, coughing up blood, depression-new, fainting, fever, headaches-new, hearing problems, heart murmur, heart rhythm  changes, itching, menstrual pain, muscle pains/cramps, night sweats,  nosebleeds, pregnancy symptoms, shortness of breath, skin rash, sleeping problems, sore throat, swelling of feet/legs, swollen lymph glands, thirst - excessive , urination - excessive , urination changes/pain, urine leakage, vision changes, and voice change.    Vital Signs:  Patient profile:   65 year old female Height:      65 inches Weight:      223 pounds BMI:     37.24 BSA:     2.07 Pulse rate:   68 / minute Pulse rhythm:   regular BP sitting:   128 / 74  (left arm) Cuff size:   large  Vitals Entered By: Ok Anis CMA (March 04, 2010 9:57 AM)   Impression & Recommendations:  Problem # 1:  DIARRHEA, CHRONIC (ICD-787.91) This is related to IBS  Recommendations #1 trial of cholestyramine one packet 4 times a day (pt cannot afford xifixan)  Problem # 2:  HEMORRHOIDS-EXTERNAL (ICD-455.3) She will require surgery but I would like to defer this until we get her diarrhea under control  Patient Instructions: 1)  Please schedule a follow-up appointment in 6 weeks.  2)  Xifaxan has been sent electronically to your pharmacy. Please pick medication up as soon as possible and take as directed on your prescription bottle. 3)  Copy sent to : [name] 4)  The medication list was reviewed and reconciled.  All changed / newly prescribed medications were explained.  A complete medication list was provided to the patient / caregiver.

## 2010-03-17 NOTE — Progress Notes (Signed)
Summary: medication  Medications Added CHOLESTYRAMINE 4 GM PACK (CHOLESTYRAMINE) Take 1 packet qid       Phone Note Call from Patient Call back at [facility] Phone [phone]   Caller: Patient Call For: dR. Maylie Ashton Reason for Call: Talk to Nurse Summary of Call:  pt needs medication calld in  to [facility] [address], pharmacy is saying they dont have it Initial call taken by: [place] Johnson,  March 04, 2010 3:33 PM  Follow-up for Phone Call        Sent Dr [name] a flag Follow-up by: Merri Ray CMA Duncan Dull),  March 04, 2010 3:45 PM  Additional Follow-up for Phone Call Additional follow up Details #1::        Dr [name] see flag, pt needs meds prescribed Additional Follow-up by: Merri Ray CMA Duncan Dull),  March 04, 2010 4:15 PM    New/Updated Medications: CHOLESTYRAMINE 4 GM PACK (CHOLESTYRAMINE) Take 1 packet qid Prescriptions: CHOLESTYRAMINE 4 GM PACK (CHOLESTYRAMINE) Take 1 packet qid  #60 x 5   Entered and Authorized by:   Louis Meckel MD   Signed by:   Louis Meckel MD on 03/04/2010   Method used:   Electronically to        [facility] [address] #[id number]* (retail)       [address] [place], [place]  [address]       Ph: [phone]       Fax: [phone]   RxID:   [id number]   Appended Document: medication Called pt to inform Dr [name] just sent in medication

## 2010-03-17 NOTE — Procedures (Signed)
Summary: Flexible Sigmoidoscopy  Patient: Connie Owens Note: All result statuses are Final unless otherwise noted.  Tests: (1) Flexible Sigmoidoscopy (FLX)  FLX Flexible Sigmoidoscopy                             DONE (C)     [facility]     [address]     [place], [place]  [address]           FLEXIBLE SIGMOIDOSCOPY PROCEDURE REPORT           PATIENT:  [name], [name]  MR#:  [id number]     BIRTHDATE:  [birth year], 64 yrs. old  GENDER:  female     ENDOSCOPIST:  Judie Petit T. Russella Dar, MD, [facility]           PROCEDURE DATE:  01/21/2010     PROCEDURE:  Flexible Sigmoidoscopy with biopsy     ASA CLASS:  Class II     INDICATIONS:  unexplained diarrhea, rectal pain     MEDICATIONS:   Fentanyl 100 mcg IV, Versed 10 mg IV     DESCRIPTION OF PROCEDURE:   After the risks benefits and     alternatives of the procedure were thoroughly explained, informed     consent was obtained.  Digital rectal exam was performed and     revealed no abnormalities, no fissures noted.  The [id number]     [id number] endoscope was introduced through the anus and advanced to     the descending colon, without limitations.  The quality of the     prep was excellent.  The instrument was then slowly withdrawn as     the mucosa was fully examined.     <<PROCEDUREIMAGES>>     The area of colon examined was normal in appearance: rectum to     descending colon. Random biopsies were obtained in the sigmoid and     descending and sent to pathology.   Retroflexed views in the     rectum revealed medium internal hemorrhoids. The scope was then     withdrawn from the patient and the procedure terminated.           COMPLICATIONS:  None           ENDOSCOPIC IMPRESSION:     1) Normal rectum to descending colon     2) Medium internal hemorrhoids           RECOMMENDATIONS:     1) Await biopsy results     2) Call the office to schedule a followup office visit with Dr.     [name] in 4 weeks     3) Canasa supp 1000mg  PR  bid for 2 weeks then bid prn           Malcolm T. Russella Dar, MD, [facility]           n.     REVISED:  01/21/2010 09:41 AM     eSIGNED:   Venita Lick. Stark at 01/21/2010 09:41 AM           Erma Pinto, [id number]  Note: An exclamation mark (!) indicates a result that was not dispersed into the flowsheet. Document Creation Date: 01/21/2010 9:42 AM _______________________________________________________________________  (1) Order result status: Final Collection or observation date-time: 01/21/2010 09:25 Requested date-time:  Receipt date-time:  Reported date-time:  Referring Physician:   Ordering Physician: Claudette Head [phone]) Specimen Source:  Source: Launa Grill Order Number: [id number]  Lab site:   Appended Document: Flexible Sigmoidoscopy     Procedures Next Due Date:    Flexible Sigmoidoscopy: 01/2015

## 2010-04-15 ENCOUNTER — Encounter: Payer: Self-pay | Admitting: Gastroenterology

## 2010-04-15 ENCOUNTER — Emergency Department (HOSPITAL_COMMUNITY)
Admission: EM | Admit: 2010-04-15 | Discharge: 2010-04-16 | Disposition: A | Discharge status: Home / Self Care | Payer: Medicare Other | Attending: Emergency Medicine | Admitting: Emergency Medicine

## 2010-04-15 ENCOUNTER — Emergency Department (HOSPITAL_COMMUNITY): Payer: Medicare Other

## 2010-04-15 ENCOUNTER — Ambulatory Visit (INDEPENDENT_AMBULATORY_CARE_PROVIDER_SITE_OTHER): Payer: Medicare Other | Admitting: Gastroenterology

## 2010-04-15 DIAGNOSIS — M1990 Unspecified osteoarthritis, unspecified site: Secondary | ICD-10-CM | POA: Insufficient documentation

## 2010-04-15 DIAGNOSIS — R197 Diarrhea, unspecified: Secondary | ICD-10-CM

## 2010-04-15 DIAGNOSIS — Y92009 Unspecified place in unspecified non-institutional (private) residence as the place of occurrence of the external cause: Secondary | ICD-10-CM | POA: Insufficient documentation

## 2010-04-15 DIAGNOSIS — M25519 Pain in unspecified shoulder: Secondary | ICD-10-CM | POA: Insufficient documentation

## 2010-04-15 DIAGNOSIS — Z79899 Other long term (current) drug therapy: Secondary | ICD-10-CM | POA: Insufficient documentation

## 2010-04-15 DIAGNOSIS — M2560 Stiffness of unspecified joint, not elsewhere classified: Secondary | ICD-10-CM | POA: Insufficient documentation

## 2010-04-15 DIAGNOSIS — R609 Edema, unspecified: Secondary | ICD-10-CM | POA: Insufficient documentation

## 2010-04-15 DIAGNOSIS — S42253A Displaced fracture of greater tuberosity of unspecified humerus, initial encounter for closed fracture: Secondary | ICD-10-CM | POA: Insufficient documentation

## 2010-04-15 DIAGNOSIS — W010XXA Fall on same level from slipping, tripping and stumbling without subsequent striking against object, initial encounter: Secondary | ICD-10-CM | POA: Insufficient documentation

## 2010-04-15 DIAGNOSIS — S43016A Anterior dislocation of unspecified humerus, initial encounter: Secondary | ICD-10-CM | POA: Insufficient documentation

## 2010-04-15 DIAGNOSIS — K648 Other hemorrhoids: Secondary | ICD-10-CM

## 2010-04-15 LAB — BASIC METABOLIC PANEL
GFR calc Af Amer: >60 mL/min (ref >60)
GFR calc non Af Amer: >60 mL/min (ref >60)
Glucose, Bld: 195 mg/dL — ABNORMAL HIGH (ref 70–99)
Potassium: 4.2 mEq/L (ref 3.5–5.1)
Sodium: 135 mEq/L (ref 135–145)

## 2010-04-15 LAB — DIFFERENTIAL
Basophils Absolute: 0 10*3/uL (ref 0.0–0.1)
Basophils Relative: 0 % (ref 0–1)
Eosinophils Absolute: 0.2 10*3/uL (ref 0.0–0.7)
Eosinophils Relative: 2 % (ref 0–5)
Lymphocytes Relative: 31 % (ref 12–46)
Lymphs Abs: 3 10*3/uL (ref 0.7–4.0)
Monocytes Absolute: 0.8 10*3/uL (ref 0.1–1.0)
Monocytes Relative: 8 % (ref 3–12)
Neutro Abs: 5.7 10*3/uL (ref 1.7–7.7)
Neutrophils Relative %: 59 % (ref 43–77)

## 2010-04-15 LAB — CBC
HCT: 41.5 % (ref 36.0–46.0)
Hemoglobin: 13.9 g/dL (ref 12.0–15.0)
RDW: 13.5 % (ref 11.5–15.5)
WBC: 9.6 10*3/uL (ref 4.0–10.5)

## 2010-04-16 ENCOUNTER — Emergency Department (HOSPITAL_COMMUNITY): Payer: Medicare Other

## 2010-04-26 NOTE — Assessment & Plan Note (Signed)
Summary: 6 WK F/U    History of Present Illness Visit Type: Follow-up Visit Primary GI MD: Melvia Heaps MD [facility] Primary Provider: [name], MD Requesting Provider: n/a Chief Complaint: Patient complains of change in bowels, she says that the cholestyramine does not seem to help. She has rectal pain, without rectal bleeding. She did not use the Canasa.  History of Present Illness:   Connie Owens  has returned still complaining of severe diarrhea and rectal pain. She was placed on cholestyramine which helped  the  loose stools, but then she complained of incomplete evacuation with a bowel movement requiring manual manipulation. She was taking 4 packets a day. She also complains of severe rectal pain. She's been evaluated by surgery in the past.   GI Review of Systems      Denies abdominal pain, acid reflux, belching, bloating, chest pain, dysphagia with liquids, dysphagia with solids, heartburn, loss of appetite, nausea, vomiting, vomiting blood, weight loss, and  weight gain.      Reports change in bowel habits, constipation, diarrhea, hemorrhoids, and  rectal pain.     Denies anal fissure, black tarry stools, diverticulosis, fecal incontinence, heme positive stool, irritable bowel syndrome, jaundice, light color stool, liver problems, and  rectal bleeding.    Current Medications (verified): 1)  Estradiol 1 Mg Tabs (Estradiol) .... Once Daily 2)  Gabapentin 100 Mg  Caps (Gabapentin) .[name].. 1 -3 Every 8 Hrs As Needed 3)  Vitamin Packet .... As Directed 4)  Voltaren 1 % Gel (Diclofenac Sodium) .... Apply Two Times A Day As Needed 5)  Losartan Potassium 100 Mg Tabs (Losartan Potassium) .[name].. 1 Once Daily 6)  Ambien Cr 12.5 Mg Cr-Tabs (Zolpidem Tartrate) .[name].. 1 By Mouth At Bedtime 7)  Cholestyramine 4 Gm Pack (Cholestyramine) .... Take 1 Packet Four Times A Day As Needed 8)  Nexium 40 Mg Cpdr (Esomeprazole Magnesium) .... Take 1 By Mouth Daily  Allergies (verified): 1)  ! Codeine 2)   ! * Iodine Contrast 3)  ! Verapamil Hcl (Verapamil Hcl)  Past History:  Past Medical History: Reviewed history from 03/04/2010 and no changes required. URI (ICD-465.9) DIARRHEA, CHRONIC (ICD-787.91) UTI (ICD-599.0) HEMORRHOIDS-INTERNAL (ICD-455.0) HEMORRHOIDS-EXTERNAL (ICD-455.3) COCCYGEAL PAIN (ICD-724.79) ANAL OR RECTAL PAIN (ICD-569.42) RECTAL PAIN (ICD-569.42) HIP PAIN, LEFT (ICD-719.45) CORNEAL DISORDER (ICD-371.9) ADVERSE DRUG REACTION (ICD-995.20) POSTMENOPAUSAL STATUS (ICD-627.2) HEMORRHOIDS (ICD-455.6) DYSPHAGIA UNSPECIFIED (ICD-787.20) BACK PAIN (ICD-724.5) DYSMETABOLIC SYNDROME X (ICD-277.7) OSTEOARTHRITIS, KNEE, RIGHT (ICD-715.96) CARPAL TUNNEL SYNDROME (ICD-354.0) GERD (ICD-530.81) DIVERTICULAR DISEASE (ICD-562.10) Hx of ADENOMATOUS COLONIC POLYP (ICD-211.3) HIATAL HERNIA (ICD-553.3) ANXIETY STATE NOS (ICD-300.00) HYPERTENSION, ESSENTIAL NOS (ICD-401.9)  Past Surgical History: Reviewed history from 02/02/2009 and no changes required. Corneal transplants bilat Colon polypectomy Lumbar spur removed Appendectomy Cholecystectomy Hysterectomy Bunionectomy, knee surgery , ankle cystectomy Lumpectomy Endo 10/08 : neg Dr [name] No PMH of perioperative complications except post op nausea Knee Replacement Rectocele surgery  Family History: Reviewed history from 06/21/2009 and no changes required. diabetes on the paternal side of the family (grandfather) 3 sisters diabetes , 1 CABG maternal side thyroid problems father heart disease , CAD, CVA, dementia  sister breast cancer, PTE pre & post Dx of CA mother positive for breast cancer No FH of Colon Cancer:  Social History: Reviewed history from 02/02/2009 and no changes required. Never Smoked Alcohol use-yes rarely Regular exercise-yes Occupation: Retired Daily Caffeine Use  Review of Systems  The patient denies allergy/sinus, anemia, anxiety-new, arthritis/joint pain, back pain, blood in urine,  breast changes/lumps, change in vision, confusion, cough, coughing  up blood, depression-new, fainting, fatigue, fever, headaches-new, hearing problems, heart murmur, heart rhythm changes, itching, menstrual pain, muscle pains/cramps, night sweats, nosebleeds, pregnancy symptoms, shortness of breath, skin rash, sleeping problems, sore throat, swelling of feet/legs, swollen lymph glands, thirst - excessive , urination - excessive , urination changes/pain, urine leakage, vision changes, and voice change.    Vital Signs:  Patient profile:   65 year old female Height:      65 inches Weight:      219.4 pounds BMI:     36.64 Pulse rate:   70 / minute Pulse rhythm:   regular BP sitting:   140 / 90  (left arm) Cuff size:   large  Vitals Entered By: Harlow Mares CMA Duncan Dull) (April 15, 2010 2:20 PM)  Physical Exam  Additional Exam:   On inspection of the rectum there are external skin tags and hemorrhoids.   Impression & Recommendations:  Problem # 1:  DIARRHEA, CHRONIC (ICD-787.91)  I instructed the patient to take cholestyramine one packet twice a day, only. She will also consider and IBS trial  Problem # 2:  HEMORRHOIDS-INTERNAL (ICD-455.0)   She remains very symptomatic characterized by pain.   Medications #1 I will get another surgical opinion  Orders: [facility] ([facility])  Patient Instructions: 1)  Copy sent to : [name], MD 2)  Your Appointment with Dr Michaell Cowing is scheduled on 04/27/2010 at 10am 3)  The medication list was reviewed and reconciled.  All changed / newly prescribed medications were explained.  A complete medication list was provided to the patient / caregiver. Prescriptions: TYLENOL WITH CODEINE #3 300-30 MG TABS (ACETAMINOPHEN-CODEINE) take 1-2 tabs every 6 hours as needed for pain  #30 x 0   Entered and Authorized by:   Connie Meckel MD   Signed by:   Connie Meckel MD on 04/15/2010   Method used:   Print then Give to Patient   RxID:    [id number]

## 2010-05-03 LAB — DIFFERENTIAL
Basophils Absolute: 0 10*3/uL (ref 0.0–0.1)
Basophils Relative: 0 % (ref 0–1)
Eosinophils Absolute: 0.1 10*3/uL (ref 0.0–0.7)
Eosinophils Relative: 2 % (ref 0–5)
Monocytes Absolute: 0.7 10*3/uL (ref 0.1–1.0)
Monocytes Relative: 11 % (ref 3–12)
Neutro Abs: 3.7 10*3/uL (ref 1.7–7.7)

## 2010-05-03 LAB — CBC
Hemoglobin: 13.2 g/dL (ref 12.0–15.0)
MCHC: 33.3 g/dL (ref 30.0–36.0)
MCV: 89.1 fL (ref 78.0–100.0)
RBC: 4.44 MIL/uL (ref 3.87–5.11)
RDW: 14.6 % (ref 11.5–15.5)

## 2010-05-03 LAB — BASIC METABOLIC PANEL
BUN: 10 mg/dL (ref 6–23)
Calcium: 9.4 mg/dL (ref 8.4–10.5)
Creatinine, Ser: 0.81 mg/dL (ref 0.4–1.2)
GFR calc non Af Amer: >60 mL/min (ref >60)
Glucose, Bld: 81 mg/dL (ref 70–99)

## 2010-05-23 LAB — DIFFERENTIAL
Basophils Relative: 1 % (ref 0–1)
Eosinophils Relative: 2 % (ref 0–5)
Lymphocytes Relative: 33 % (ref 12–46)
Monocytes Absolute: 0.9 10*3/uL (ref 0.1–1.0)
Monocytes Relative: 12 % (ref 3–12)
Neutro Abs: 4 10*3/uL (ref 1.7–7.7)

## 2010-05-23 LAB — COMPREHENSIVE METABOLIC PANEL
AST: 30 U/L (ref 0–37)
Albumin: 3.5 g/dL (ref 3.5–5.2)
Alkaline Phosphatase: 85 U/L (ref 39–117)
BUN: 13 mg/dL (ref 6–23)
Chloride: 105 mEq/L (ref 96–112)
GFR calc Af Amer: >60 mL/min (ref >60)
Potassium: 4.5 mEq/L (ref 3.5–5.1)
Total Bilirubin: 0.5 mg/dL (ref 0.3–1.2)
Total Protein: 6.9 g/dL (ref 6.0–8.3)

## 2010-05-23 LAB — CBC
HCT: 39.8 % (ref 36.0–46.0)
Platelets: 287 10*3/uL (ref 150–400)
RDW: 14.5 % (ref 11.5–15.5)
WBC: 7.5 10*3/uL (ref 4.0–10.5)

## 2010-05-23 LAB — POCT CARDIAC MARKERS
CKMB, poc: <1 ng/mL — ABNORMAL LOW (ref 1.0–8.0)
Troponin i, poc: <0.05 ng/mL (ref 0.00–0.09)

## 2010-06-07 ENCOUNTER — Other Ambulatory Visit: Payer: Self-pay | Admitting: Internal Medicine

## 2010-06-28 NOTE — Assessment & Plan Note (Signed)
[name]                         GASTROENTEROLOGY OFFICE NOTE   Connie Owens, Connie Owens                     MRN:          [id number]  DATE:11/15/2006                            DOB:          1945-09-23    REASON FOR CONSULTATION:  Abdominal pain.   Connie Owens is a pleasant 65 year old white female referred through the  courtesy of Dr.  Alwyn Ren for evaluation. For the last 2-3 months, she has  been complaining of left upper quadrant and lower chest pain. The pain  may be worsened post-prandially. It can radiate up into her chest. It is  non-exertional. She denies nausea. She has pyrosis which is well-  controlled with Nexium. She denies dysphagia or change in bowel habits.  An abdominal ultrasound apparently was negative.   PAST MEDICAL HISTORY:  1. Hypertension.  2. Arthritis.  3. She suffers from chronic headaches.  4. She is status post cholecystectomy.  5. Status post hysterectomy.  6. Status post tubal ligation.  7. Status post appendectomy.  8. Status post bilateral corneal transplant.  9. She has a history of colon polyps diagnosed in 1999. Colonoscopy in      2002, showed diverticulosis only.   FAMILY HISTORY:  Is pertinent for mother and sister with breast cancer.   MEDICATIONS:  1. Premarin.  2. Nexium.  3. Reglan p.r.n.  4. Verelan.  5. Ambien.  6. Citalopram.   ALLERGIES:  CODEINE AND IODINE CONTRAST, VIOXX AND CELEBREX.   SOCIAL HISTORY:  She neither smokes nor drinks. She is married. Works  for [employer] and doors.   REVIEW OF SYSTEMS:  Is positive for joint pains, headache, and some  anxiety.   PHYSICAL EXAMINATION:  Pulse 68. Blood pressure 130/90. Weight is 214.  HEENT: EOMI.  PERRLA.  Sclerae are anicteric.  Conjunctivae are pink.  NECK:  Supple without thyromegaly, adenopathy or carotid bruits.  On examination of the abdomen and chest, she has mild palpable  tenderness over the xyphoid process and just to the  left of the  manubrium. There is minimal subxyphoid tenderness.  CHEST:  Clear to auscultation and percussion without adventitious  sounds.  CARDIAC:  Regular rhythm; normal S1 S2.  There are no murmurs, gallops  or rubs.  ABDOMEN:  Bowel sounds are normoactive.  There are no abdominal masses,  tenderness or organomegaly except for the aforementioned tenderness  under the subxyphoid area. Abdomen is soft and nondistended.  There are  no abdominal masses, tenderness, splenic enlargement or hepatomegaly.  EXTREMITIES:  Full range of motion.  No cyanosis, clubbing or edema.  RECTAL:  Deferred.   IMPRESSION:  1. Persistent lower chest and left upper quadrant pain. I am      suspicious that this is actually musculoskeletal pain rather than      visceral pain. Pain from ulcerative or non-ulcerative dyspepsia is      less likely, although ought to be ruled out.  2. History of colon polyps.   RECOMMENDATION:  1. Continue current medications including Nexium.  2. Upper endoscopy. If this is negative, then I would consider a trial  of anti-inflammatory medications.  3. Followup colonoscopy in approximately 2012.     Barbette Hair. [name], MD,FACG  Electronically Signed    RDK/MedQ  DD: 11/15/2006  DT: 11/15/2006  Job #: [id number]   cc:   Titus Dubin. Alwyn Ren, MD,FACP,FCCP

## 2010-06-28 NOTE — Discharge Summary (Signed)
Connie Owens, Connie Owens NO.:  [id number]   MEDICAL RECORD NO.:  [id number]          PATIENT TYPE:  INP   LOCATION:  1620                         FACILITY:  [facility]   PHYSICIAN:  Georges Lynch. Gioffre, M.D.DATE OF BIRTH:  [birth year]   DATE OF ADMISSION:  07/31/2007  DATE OF DISCHARGE:  08/04/2007                               DISCHARGE SUMMARY   DISCHARGE:  To home.   ADMISSION DIAGNOSIS:  1. End-stage osteoarthritis right knee.  2. Hypertension.  3. Hiatal hernia.  4. Reflux disease.  5. History of diverticulitis.  6. History of perfusion dystrophy  7. History urinary incontinence.  8. History of migraines.  9. Tinnitus.  10.Upper and lower partial dentures.   DISCHARGE DIAGNOSIS:  1. Right total knee arthroplasty.  2. Postoperative pulmonary atelectasis, improved with the respiratory      treatments.  3. Postoperative blood loss anemia, tolerated well, did not require IV      replacements, treated with p.o. supplements.  4. History of hypertension.  5. History of hiatal hernia.  6. History of reflux disease.  7. History of diverticulosis.  8. History of diffuse disease.  9. History urinary continence.  10.History of migraines.  11.History of tinnitus  12.Upper and lower dentures.   HISTORY OF PRESENT ILLNESS:  The patient is 64 year old female with  painful range of motion of her right knee.  Patient has advanced  arthritis in the knee.  The  patient has failed conservative treatment.  The patient has elected to proceed with a total knee arthroplasty.   ALLERGIES:  On admission:  IV CONTRAST, CODEINE, and PLASTIC TAPE.   MEDICATIONS ON ADMISSION:  1. Metoclopramide 5 mg 30 minutes prior to bedtime.  2. Nexium 40 mg a day.  3. Premarin 0.625 mg once a day.  4. Prednisolone acetate 1% suspension 1 drop b.i.d. to the right eye.  5. Verapamil 240 mg a day.  6. Ambien 12.5 mg q.h.s.  7. Gabapentin 100 mg once a day.  8. Darvocet p.r.n.  9.  Multivitamins.   SURGICAL PROCEDURES:  On July 31, 2007 the patient was taken to the OR  by Dr. Worthy Rancher assisted by Oneida Alar PA-C.  Under general anesthesia  the patient underwent a right total knee arthroplasty with a DePuy  rotating platform system.  The patient tolerated the OR procedure well.  There were no complications.  The patient had the following components  implanted:  A size 3 right femoral component, size 3 keel tibial tray, a  size three 10-mm polyethylene bearing, size 38 mm three peg patella.  All components were implanted with polymethyl methacrylate and  vancomycin.   CONSULTS:  The following routine consults requested:  Physical therapy,  case management, pharmacy.   HOSPITAL COURSE:  On July 31, 2007 the patient was admitted to [facility] in the care of Dr. Worthy Rancher. The patient was taken to  the OR where a right total knee arthroplasty was performed without any  complications.  The patient was transferred to the recovery room and  then to the orthopedic floor in good condition  on IV antibiotics, pain  medicines, and DVT prophylaxis, to follow a total knee protocol.  That  patient then incurred 4 days' postoperative course in which the patient  had no other significant untoward events.  The patient did have gradual  drop in her hemoglobin with her hemoglobin dropping to 9.4 on date of  discharge, but the patient tolerated it well without any complications  so she was allowed to self correct with p.o. supplements.  The patient's  wound remained benign for any signs of infection.  Her leg remained  neuromotor vascularly intact.  The patient worked well with physical  therapy, and the patient was discharged on postop day #4 in good  condition with home health physical therapy per routine protocol.  The  patient did have some upper respiratory congestion during her  hospitalization.  Chest x-ray showed some atelectasis or pneumonia.  The  patient's  situation did improve with just some respiratory treatments  and inspiration spirometer and  pulmonary toileting and she did not  require any antibiotics.   The patient was discharged home in good condition with outpatient  followup per protocol.   LABS:  CBC on admission found WBC 7.2, hemoglobin 14.1, hematocrit 41.2,  platelets 292.  Her H and H dropped to 9.4 over 27.9 on June 20.  Routine chemistries on admission were all within normal limits.  The  patient's INR on date of discharge was 1.8.  Routine urinalysis on  admission was normal.  Postoperative chest x-ray on the 20th showed that  she had some new pleural effusions with compressive basilar atelectasis,  more confluent left perihilar opacity.  Could reflect additional  atelectasis, aspiration, or bronchial pneumonia.  She also has a hiatal  hernia.   DISCHARGE INSTRUCTIONS:  Provided by orthopedic coverage.   DIET:  No restrictions.   ACTIVITY:  The patient is to increase her activity slowly using her  walker.   WOUND CARE:  The patient is to keep her wound clean and dry, change the  dressing daily.   FOLLOWUP:  The patient needs a followup appointment with Dr. Darrelyn Hillock in  10-14 days from the date of surgery.  The patient is to call [phone]  for that followup appointment.   MEDICATIONS:  1. Percocet 10/650 one tablet every 4-6 hours for pain if needed.  2. Coumadin 5 mg tablets 1-1/2 tablets once a day until changed by      [facility] pharmacy for total of 30 days.  3. Robaxin 500 mg 1 tablet every 6 hours for muscle spasms if needed.  4. Metoclopramide 5 mg a day.  5. Gabapentin 100 mg once a day.  6. Nexium 40 mg once a day.  7. Premarin 0.625 mg once a day.  8. Ambien CR 12.5 mg once a day.  9. Darvocet on hold unless problems taking the Percocet.  10.Verapamil 240 mg once a day.  11.Tylenol arthritis p.r.n.  12.Fluocinonide 0.05% to skin daily.  13.Prednisolone eye drops 1% one drop to the right eye in the  a.m. 1      drop in the left eye in the p.m.  14.Vitamin pack may restart after complete Coumadin.   The patient's condition upon discharge to home is listed improved and  good.      Jamelle Rushing, P.A.    ______________________________  Georges Lynch Darrelyn Hillock, M.D.    RWK/MEDQ  D:  08/21/2007  T:  08/21/2007  Job:  [id number]

## 2010-06-28 NOTE — Op Note (Signed)
NAMESHAKEITHA, UMBAUGH              ACCOUNT NO.:  [id number]   MEDICAL RECORD NO.:  [id number]          PATIENT TYPE:  INP   LOCATION:  0002                         FACILITY:  [facility]   PHYSICIAN:  Georges Lynch. Gioffre, M.D.DATE OF BIRTH:  November 08, 1945   DATE OF PROCEDURE:  07/31/2007  DATE OF DISCHARGE:                               OPERATIVE REPORT   SURGEON:  Georges Lynch. Darrelyn Hillock, M.D.   ASSISTANT:  Jamelle Rushing P.A.   PREOPERATIVE DIAGNOSIS:  Severe degenerative arthritis, with a mild  valgus deformity of the right knee.   POSTOPERATIVE DIAGNOSIS:  Severe degenerative arthritis, with a mild  valgus deformity of the right knee.   OPERATION:  Right total knee arthroplasty utilizing a DePuy system.  I  utilized a size 3 cemented tibial tray.  I utilized a size 3 right  posterior stabilized cemented femoral component.  I utilized a size 38  patella, and the tibial insert was a rotating platform size 3, 10 mm in  thickness.  I did use vancomycin in the cement.   The patient was taken to surgery.  Routine orthopedic prep and draping  of the right lower extremity was carried out.  The leg was exsanguinated  with an Esmarch.  The tourniquet was elevated to 350 mmHg.  Note, we  went through the appropriate time-out procedure.  Incision was made over  the anterior aspect of the left knee with the knee flexed.  Two flaps  were created.  I then carried out a median parapatellar incision and  reflected the patella laterally, flexed the knee, did medial and lateral  meniscectomies, and excised the anterior and posterior cruciate  ligaments.  Then, my initial drill hole was made in the intercondylar  notch in the usual fashion.  I then inserted my #1 jig and removed 12 mm  thickness off of the distal femur.  Following that, a #2 jig was  inserted for a size 3 and measured size 3 right femur.  Then, the  appropriate cuts were made.  Following that, we then prepared the tibia  in the usual fashion.   We measured the tibia to be a size 3.  Drill hole  was made in the tibial plateau, and the guide rods were inserted  intramedullary.  I then removed 4 mm thickness off the affected lateral  side of the tibia.  Following that, I then cut my keel cut in the tibia.  Once the cuts were made, I went back up, made my notch, and cut the  femur.  We then measured our flexion gaps and extension gaps.  The trial  prostheses were inserted, and we had an excellent fit with a size 3, 10  mm thickness tibial insert.  We had good stability in all planes.  I  then did a resurfacing procedure on the patella for a size 38 patella.  Three drill holes were made in the patellar articular surface.  Following that, we removed all the trial components, thoroughly  irrigated out the knee, and cemented all three components in  simultaneously.  Vancomycin was used in the cement.  At that time, prior  to cementing the prosthesis in, I injected 30 mL of 0.25% Marcaine with  epinephrine and 30 mg of Toradol into the surrounding soft tissue.  After all the  components were cemented, we checked for loose pieces of cement.  We  then inserted our permanent tibial insert and reduced the knee, and took  knee through motion.  Once again, we had good stability.  I then  inserted a Hemovac drain.  The wound was closed in layers in the usual  fashion.  Sterile Neosporin dressing was applied.           ______________________________  Georges Lynch Darrelyn Hillock, M.D.     RAG/MEDQ  D:  07/31/2007  T:  07/31/2007  Job:  [id number]   cc:   Titus Dubin. Alwyn Ren, MD,FACP,FCCP  [phone] [place]  [place] [address]

## 2010-06-28 NOTE — Assessment & Plan Note (Signed)
[facility]                         GASTROENTEROLOGY OFFICE NOTE   VIVI, PICCIRILLI                     MRN:          [id number]  DATE:02/22/2007                            DOB:          08-15-1945    PROBLEM:  Abdominal pain.   Mrs. Fullenwider has returned for a reevaluation.  Upper abdominal pain  continues.  It is very poorly described.  It is not affected by eating  or body movements.  Upper endoscopy on 11/16/2006 demonstrated a small  hiatal hernia.  She was given Clinoril, but did not tolerate it.  She  has had difficulties with Celebrex as well in the past.  Appetite is  good and weight is stable.   PHYSICAL EXAMINATION:  Pulse:  80.  Blood pressure:  132/84.  Weight:  216.   IMPRESSION:  Nonspecific upper abdominal pain.  This could be due to  nonulcer dyspepsia or musculoskeletal pain.  Unfortunately, she is  intolerant to nonsteroidals.   RECOMMENDATIONS:  1. Decrease Nexium to 40 mg once a day.  2. Trial of ___levbid_______  0.375 mg twice a day.     Barbette Hair. [name], MD,FACG  Electronically Signed    RDK/MedQ  DD: 02/22/2007  DT: 02/22/2007  Job #: [id number]   cc:   Titus Dubin. Alwyn Ren, MD,FACP,FCCP

## 2010-06-28 NOTE — H&P (Signed)
NAMEDIMONIQUE, BOURDEAU              ACCOUNT NO.:  [id number]   MEDICAL RECORD NO.:  [id number]          PATIENT TYPE:  INP   LOCATION:  NA                           FACILITY:  [facility]   PHYSICIAN:  Georges Lynch. Gioffre, M.D.DATE OF BIRTH:  1945-03-06   DATE OF ADMISSION:  07/31/2007  DATE OF DISCHARGE:                              HISTORY & PHYSICAL   CHIEF COMPLAINT:  Painful range of motion of the right knee.   HISTORY OF PRESENT ILLNESS:  The patient is a 65 year old female who is  here today for a history and physical for her upcoming right total knee  arthroplasty by Dr. Darrelyn Hillock on July 31, 2007.  The patient has been  diagnosed with advanced arthritis of the right knee.  She has failed  conservative treatment.  The patient has elected to proceed with a right  total knee arthroplasty.   ALLERGIES:  1. IV CONTRAST.  2. CODEINE.  3. PLASTIC TAPE.   CURRENT MEDICATIONS:  1. Metoclopramide 5 mg tablets, 1 tablet 30 minutes prior to bedtime.  2. Nexium 40 mg a day.  3. Premarin 0.625 mg estrogen tablets once a day.  4. Prednisolone acetate 1% suspension, 1 drop b.i.d. to the right eye.  5. Verapamil 240 mg a day.  6. Ambien 12.5 mg p.o. at bedtime.  7. Gabapentin 100 mg once a day.  8. Darvocet-N 100 p.r.n.  9. Multivitamins.   PRESENT MEDICAL HISTORY:  1. Hypertension.  2. Hiatal hernia.  3. Reflux disease.  4. History of diverticulosis.  5. History of Fuchs' dystrophy of the corneas, with subsequent      bilateral transplants.  6. Recent urinary incontinence.  7. History of migraines.  8. Tinnitus.  9. Upper and lower partial dentures.   REVIEW OF SYSTEMS:  Positive for occasional migraines, improved with the  Neurontin.  She has poor vision secondary to the corneal transplants.  She does have some issues related to her tinnitus.  She denies any  alcohol or drug abuse.  PULMONARY:  Unremarkable.  CARDIOVASCULAR:  She  denies any chest pain, shortness of breath.  No  skipping or fast beats.  Hypertension medication has been stable.  GI:  Her reflux disease as  well controlled with Nexium.  Her last diverticulitis attack was 10  years previous.  GU:  She is having issues with incontinence.  She is  going to start incontinence medications after this upcoming surgery.  Recent gynecologic exam with a negative urinary workup.  ENDOCRINE:  Unremarkable.  HEMATOLOGIC/ONCOLOGIC:  Negative.   FAMILY MEDICAL HISTORY:  Father is deceased at the age of 54 from  complications of stroke, hypertension, and heart problems.  Mother is  alive, with history of breast cancer.  She has one sister who has a  history of breast cancer and diabetes.   PAST SURGICAL HISTORY:  1. Appendectomy in 1966.  2. Tubal ligation in 1970.  3. Hysterectomy in 1983.  4. Ovarian cyst removed in 1985.  5. Rectocele repair in 1990.  6. Breast calcific masses removed in 1966.  7. Back surgery by Dr. Channing Mutters in 1999.  8. Cholecystectomy in 2001.  9. She has had corneal transplants in 2001 and 2005, with cataract      removed in 2007, with a repeat corneal repair in 2009.  MUST WEAR      CORNEAL EYE SHIELDS DURING SURGERY.   OPHTHALMOLOGIST:  Is Dr. Lauraine Rinne at [facility], [phone].   PRIMARY CARE PHYSICIAN:  Titus Dubin. Alwyn Ren, MD,FACP,FCCP.   SOCIAL HISTORY:  The patient is married.  Lives with her husband in a  single family home.   PHYSICAL EXAMINATION:  VITAL SIGNS:  Height is 5 feet 5 inches, weight  is 224 pounds, blood pressure was 152/98, pulse of 76 and regular,  respirations 12, nonlabored.  The patient was afebrile.  GENERAL:  This is a healthy-appearing, heavyset female conscious, alert,  and appropriate.  Appears to be a good historian.  Easily gets on and  off the exam table.  HEENT:  Pupils have excellent range of motion.  Gross hearing is intact.  Oral buccal mucosa was pink and moist.  Upper and lower partial plates  in place.  NECK:  Supple, with good  range of motion.  CHEST:  Lung sounds were clear and equal bilaterally.  No wheezes,  rales, or rhonchi.  HEART:  Regular rate and rhythm.  ABDOMEN:  Positive bowel sounds.  Soft, nontender.  EXTREMITIES:  Upper extremities had good range of motion of the  shoulders, elbows, and wrists.  LOWER EXTREMITIES:  Right and left hip had full range of motion, without  any discomfort.  Right knee was painful with full extension, flexion  back to 120 degrees.  She did have some play with valgus/varus laxity,  but no gross instability.  The calf was soft, nontender.  Left knee had  full extension, flexion back to 120, with no instability.  Both knees  had no signs of any infections or dermatologic changes over the anterior  aspects.  She had good range of motion of both ankles.  PERIPHERAL VASCULAR:  Carotid pulses were 2+, no bruits.  Radial pulses  were 2+, dorsalis pedis pulses were 1+.  She did have some lower  extremity edema and varicosities.  No pigmentation changes.  NEUROLOGIC:  The patient had no gross neurologic defects noted.  BREASTS/RECTAL/GU:  Deferred at this time.   IMPRESSION:  1. Severe osteoarthritis right knee.  2. Osteoarthritis left knee.  3. History of Fuchs' dystrophy, with bilateral corneal implants, with      prednisolone drops right eye due to complications, requiring eye      shields during surgical procedure.  4. Hypertension.  5. Hiatal hernia.  6. Reflux disease.  7. History of diverticulosis.  8. History of migraines.  9. History of tinnitus.  10.Upper and lower partial plates  11.History of urinary incontinence.   PLAN:  The patient has been evaluated by her primary care physician and  cleared for this upcoming procedure.  The patient will undergo all  routine labs and tests prior to having a right total knee arthroplasty  by Dr. Darrelyn Hillock at [facility] on July 31, 2007.      Jamelle Rushing, P.A.    ______________________________  Georges Lynch Darrelyn Hillock, M.D.    RWK/MEDQ  D:  07/23/2007  T:  07/23/2007  Job:  [id number]

## 2010-07-01 NOTE — Op Note (Signed)
NAME:  HADLEI, STITT                        ACCOUNT NO.:  [id number]   MEDICAL RECORD NO.:  [id number]                   PATIENT TYPE:  AMB   LOCATION:  DAY                                  FACILITY:  [facility]   PHYSICIAN:  Georges Lynch. Darrelyn Hillock, M.D.             DATE OF BIRTH:  May 06, 1945   DATE OF PROCEDURE:  03/12/2002  DATE OF DISCHARGE:                                 OPERATIVE REPORT   PREOPERATIVE DIAGNOSIS:  1. Severe impingement syndrome of the left shoulder.  2. Severe chronic left deltoid bursitis, left shoulder.  3. Abrasion of the rotator cuff, left shoulder, secondary to the severe     impingement syndrome.   POSTOPERATIVE DIAGNOSIS:  1. Severe impingement syndrome of the left shoulder.  2. Severe chronic left deltoid bursitis, left shoulder.  3. Abrasion of the rotator cuff, left shoulder, secondary to the severe     impingement syndrome.   OPERATION PERFORMED:  1. Open decompression of the left shoulder.  2. Excision of subdeltoid bursa of left shoulder.  3. Exploration of the left rotator cuff.   SURGEON:  Georges Lynch. Darrelyn Hillock, M.D.   ASSISTANT:  Ebbie Ridge. Paitsel, P.A.   ANESTHESIA:  General.   DESCRIPTION OF PROCEDURE:  Prior to taking the patient back with general  anesthesia, she was in the holding area and was given an interscalene block  on the left.  She was then taken back under general anesthesia, routine  orthopedic prep and drape of the left shoulder was carried out.  An incision  was made over the anterior aspect of the left shoulder.  Bleeders were  identified and cauterized.  At this time the self-retaining retractor were  inserted.  I then went down and detached the deltoid tendon by sharp  dissections of the acromion and I immediately noticed that she had a severe  chronic thickened bursa.  I excised that.  She had severe impingement  syndrome secondary to downgrowth of the acromion, literally was embedding  into the rotator cuff and did abrade  the cuff.  There were no frank tears of  the cuff.  We thoroughly irrigated the area out and re-explored the cuff.  There was no severe cuff tears noted.  The cuff was just a little abraded.  I think at this point it was obviously due to the impingement syndrome.  Following this, I cauterized the remaining bleeders in the area and bone  waxed the undersurface of the acromion where we did our acromionectomy and  then inserted some Gelfoam for hemostasis purposes up into the subacromial  space.  The wound was thoroughly irrigated prior to this and then the  tendons and muscle were reapproximated in the usual fashion.  The subcu was  closed with 0 Vicryl, skin with metal staples.  A sterile Neosporin dressing  was applied and the patient was placed in a shoulder immobilizer.  Ronald A. Darrelyn Hillock, M.D.   RAG/MEDQ  D:  03/12/2002  T:  03/12/2002  Job:  [id number]

## 2010-07-05 ENCOUNTER — Other Ambulatory Visit: Payer: Self-pay

## 2010-07-05 MED ORDER — LOSARTAN POTASSIUM 100 MG PO TABS: ORAL_TABLET | ORAL | Status: DC

## 2010-07-05 NOTE — Telephone Encounter (Signed)
Spoke with patient to clarify if she is using local or mail order company, rx request(Losartan) received from [facility] and rx was recently filled at [facility]. Patient indicated through her insurance she can use [facility] for zero co-pay.    Called [facility] and informed them to cancel any pending refills on Losartan

## 2010-07-12 ENCOUNTER — Other Ambulatory Visit: Payer: Self-pay | Admitting: Internal Medicine

## 2010-08-09 ENCOUNTER — Other Ambulatory Visit: Payer: Self-pay | Admitting: Internal Medicine

## 2010-08-10 MED ORDER — GABAPENTIN 100 MG PO CAPS: ORAL_CAPSULE | ORAL | Status: DC

## 2010-09-02 ENCOUNTER — Other Ambulatory Visit: Payer: Self-pay | Admitting: Obstetrics and Gynecology

## 2010-09-02 DIAGNOSIS — Z1231 Encounter for screening mammogram for malignant neoplasm of breast: Secondary | ICD-10-CM

## 2010-09-12 ENCOUNTER — Ambulatory Visit
Admission: RE | Admit: 2010-09-12 | Discharge: 2010-09-12 | Disposition: A | Discharge status: Home / Self Care | Payer: Medicare Other | Source: Ambulatory Visit | Attending: Obstetrics and Gynecology | Admitting: Obstetrics and Gynecology

## 2010-09-12 DIAGNOSIS — Z1231 Encounter for screening mammogram for malignant neoplasm of breast: Secondary | ICD-10-CM

## 2010-09-13 ENCOUNTER — Other Ambulatory Visit: Payer: Self-pay | Admitting: Obstetrics and Gynecology

## 2010-09-13 DIAGNOSIS — R928 Other abnormal and inconclusive findings on diagnostic imaging of breast: Secondary | ICD-10-CM

## 2010-09-20 ENCOUNTER — Encounter: Payer: Self-pay | Admitting: Internal Medicine

## 2010-09-21 ENCOUNTER — Ambulatory Visit
Admission: RE | Admit: 2010-09-21 | Discharge: 2010-09-21 | Disposition: A | Discharge status: Home / Self Care | Payer: Medicare Other | Source: Ambulatory Visit | Attending: Obstetrics and Gynecology | Admitting: Obstetrics and Gynecology

## 2010-09-21 DIAGNOSIS — R928 Other abnormal and inconclusive findings on diagnostic imaging of breast: Secondary | ICD-10-CM

## 2010-11-10 LAB — CBC
HCT: 41.2
Hemoglobin: 14.1
RBC: 4.69

## 2010-11-10 LAB — ABO/RH: ABO/RH(D): O POS

## 2010-11-10 LAB — COMPREHENSIVE METABOLIC PANEL
ALT: 20
Alkaline Phosphatase: 80
BUN: 11
CO2: 26
GFR calc non Af Amer: >60
Glucose, Bld: 87
Potassium: 4.3
Sodium: 139
Total Bilirubin: 0.7

## 2010-11-10 LAB — HEMOGLOBIN AND HEMATOCRIT, BLOOD
HCT: 30.6 — ABNORMAL LOW
HCT: 32.4 — ABNORMAL LOW
Hemoglobin: 10.4 — ABNORMAL LOW
Hemoglobin: 11 — ABNORMAL LOW

## 2010-11-10 LAB — PROTIME-INR
INR: 1
INR: 1.2
INR: 1.8 — ABNORMAL HIGH
INR: 1.8 — ABNORMAL HIGH
Prothrombin Time: 12.9
Prothrombin Time: 15
Prothrombin Time: 21.5 — ABNORMAL HIGH
Prothrombin Time: 21.5 — ABNORMAL HIGH

## 2010-11-10 LAB — DIFFERENTIAL
Basophils Absolute: 0
Basophils Relative: 1
Eosinophils Absolute: 0.1
Neutrophils Relative %: 61

## 2010-11-10 LAB — TYPE AND SCREEN
ABO/RH(D): O POS
Antibody Screen: NEGATIVE

## 2010-11-10 LAB — URINALYSIS, ROUTINE W REFLEX MICROSCOPIC
Bilirubin Urine: NEGATIVE
Hgb urine dipstick: NEGATIVE
Ketones, ur: NEGATIVE
Protein, ur: NEGATIVE
Specific Gravity, Urine: 1.006
Urobilinogen, UA: 0.2

## 2010-11-16 ENCOUNTER — Ambulatory Visit (INDEPENDENT_AMBULATORY_CARE_PROVIDER_SITE_OTHER): Payer: Medicare Other | Admitting: Surgery

## 2010-11-16 ENCOUNTER — Ambulatory Visit (INDEPENDENT_AMBULATORY_CARE_PROVIDER_SITE_OTHER): Payer: Medicare Other

## 2010-11-16 ENCOUNTER — Encounter (INDEPENDENT_AMBULATORY_CARE_PROVIDER_SITE_OTHER): Payer: Self-pay | Admitting: Surgery

## 2010-11-16 DIAGNOSIS — K6289 Other specified diseases of anus and rectum: Secondary | ICD-10-CM

## 2010-11-16 DIAGNOSIS — R197 Diarrhea, unspecified: Secondary | ICD-10-CM

## 2010-11-16 DIAGNOSIS — Z23 Encounter for immunization: Secondary | ICD-10-CM

## 2010-11-16 DIAGNOSIS — K648 Other hemorrhoids: Secondary | ICD-10-CM

## 2010-11-16 NOTE — Patient Instructions (Signed)

## 2010-11-16 NOTE — Progress Notes (Signed)
Subjective:     Patient ID: Connie Owens, female   DOB: 1945-11-23, 65 y.o.   MRN: [id number]  HPI  Patient Care Team: Pecola Lawless, MD as PCP - General Louis Meckel, MD as Consulting Physician (Gastroenterology)  This patient is a 65 y.o.female who presents today for surgical evaluation.   Reason for visit: Worsening anal & rectal pain. Question of fissure versus hemorrhoids.  Patient is a obese pleasant woman who struggled with chronic diarrhea and rectal pain for years. She's been followed by [facility] gastroenterology. She had struggled with a lot of chronic diarrhea. She had seen [place] last year. She had examination under anesthesia for concerns of a possible fissure. Examination was rather normal. She had some mild anal dilation for some tightness.  I saw her in March 2012 for concerns of some hemorrhoids. I recommended bowel regimen to control her hemorrhoid problems. She notes that in much better control now. She has 2 bowel movements a day. However, she still struggles with anorectal pain. She is not been pain free for 3 years. She notes the right anterior aspect is very painful for her. She often will use warm soaks and wet wipes. She is not have much help from creams or salves. She tried diltiazem cream for the past 5 days without any improvement. She comes in today wondering if anything else could be done.  She's had colonoscopies in the past. No evidence of proctitis or other abnormalities. Upper workup is negative as well. No prior anorectal surgery except for the examination last year.  Past Medical History  Diagnosis Date  . URI (upper respiratory infection)   . Diarrhea     chronic  . UTI (urinary tract infection)   . Internal hemorrhoids   . External hemorrhoid   . Coccygeal pain   . Anal or rectal pain   . Left hip pain   . Corneal disorder   . Adverse drug reaction   . Postmenopausal status   . Dysphagia   . Back pain   . Dysmetabolic syndrome X   .  Arthritis     osteo...right knee  . Carpal tunnel syndrome   . GERD (gastroesophageal reflux disease)   . Diverticular disease   . Hx of adenomatous colonic polyps   . Hernia     hiatal  . Anxiety state     NOS  . Hypertension     essen. NOS  . Rectal pain     Past Surgical History  Procedure Date  . Appendectomy   . Cholecystectomy   . Abdominal hysterectomy   . Breast surgery     lumpectomy  . Corneal transplants     bilateral  . Colonoscopy w/ polypectomy   . Lumbar spur removed   . Knee surgery   . Bunionectomy   . Ankle cystectomy   . Rectocele surgery   . 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 Marital Status: Married    Spouse Name: N/A    Number of Children: N/A  . Years of Education: N/A   Occupational History  . retired    Social History Main Topics  . Smoking status: Never Smoker   . Smokeless tobacco: Not on file  . Alcohol Use: Yes     rare  . Drug Use: No  . Sexually Active:    Other Topics Concern  . Not on file   Social History Narrative  Daily caffeineRegular exercise    Family History  Problem Relation Age of Onset  . Cancer Mother     breast  . Heart disease Father     CAD  . Stroke Father     CVA  . Dementia Father   . Diabetes Sister     3 sisters   . Heart disease Sister     1 sister CABG  . Cancer Sister     BREAST;PTE pre & post Dx of ca  . Diabetes Paternal Grandfather     Current outpatient prescriptions:diclofenac sodium (VOLTAREN) 1 % GEL, Apply 1 application topically. Apply two times daily , Disp: , Rfl: ;  esomeprazole (NEXIUM) 40 MG capsule, Take 40 mg by mouth daily before breakfast.  , Disp: , Rfl: ;  estradiol (ESTRACE) 1 MG tablet, Take 1 mg by mouth daily.  , Disp: , Rfl: ;  gabapentin (NEURONTIN) 100 MG capsule, TAKE 1 TO 3 CAPSULES BY MOUTH EVERY 8 HOURS AS NEEDED, Disp: 90 capsule, Rfl: 1 loperamide (IMODIUM) 2 MG capsule, Take 2 mg by mouth 4 (four) times daily as  needed.  , Disp: , Rfl: ;  losartan (COZAAR) 100 MG tablet, 1 by mouth daily, Disp: 90 tablet, Rfl: 1;  psyllium (METAMUCIL) 58.6 % powder, Take 1 packet by mouth daily.  , Disp: , Rfl: ;  zolpidem (AMBIEN CR) 12.5 MG CR tablet, Take 12.5 mg by mouth at bedtime.  , Disp: , Rfl:   Allergies  Allergen Reactions  . Iohexol   . Verapamil     REACTION: dizziness       Review of Systems  Constitutional: Negative for fever, chills, diaphoresis, appetite change and fatigue.  HENT: Negative for ear pain, sore throat, trouble swallowing, neck pain and ear discharge.   Eyes: Negative for photophobia, discharge and visual disturbance.  Respiratory: Negative for cough, choking, chest tightness and shortness of breath.   Cardiovascular: Negative for chest pain and palpitations.  Gastrointestinal: Positive for diarrhea and rectal pain. Negative for nausea, vomiting, abdominal pain, constipation, blood in stool and anal bleeding.       BM BID w imodium & metamucil  Genitourinary: Negative for dysuria, frequency and difficulty urinating.  Musculoskeletal: Negative for myalgias and gait problem.  Skin: Negative for color change, pallor and rash.  Neurological: Negative for dizziness, speech difficulty, weakness and numbness.  Hematological: Negative for adenopathy.  Psychiatric/Behavioral: Negative for confusion and agitation. The patient is not nervous/anxious.        Objective:   Physical Exam  Constitutional: She is oriented to person, place, and time. She appears well-developed and well-nourished. No distress.  HENT:  Head: Normocephalic.  Mouth/Throat: Oropharynx is clear and moist. No oropharyngeal exudate.  Eyes: Conjunctivae and EOM are normal. Pupils are equal, round, and reactive to light. No scleral icterus.  Neck: Normal range of motion. Neck supple. No tracheal deviation present.  Cardiovascular: Normal rate, regular rhythm and intact distal pulses.   Pulmonary/Chest: Effort normal  and breath sounds normal. No respiratory distress. She exhibits no tenderness.  Abdominal: Soft. She exhibits no distension and no mass. There is no tenderness. Hernia confirmed negative in the right inguinal area and confirmed negative in the left inguinal area.       Obese, midline & Pfannenstiel incisions w/o hernia  Genitourinary: Vagina normal. No vaginal discharge found.       Perianal skin clean with good hygiene.  No pruritis.  Normal sphincter tone.  No fissure.  No abscess/fistula.  No pilonidal disease.    Tolerates digital and anoscopic rectal exam.  No rectal masses.    Hemorrhoidal piles congested / inflamed R post>ant, L lat mildest.  R post > ant prolapse w Valsalva  Musculoskeletal: Normal range of motion. She exhibits no tenderness.  Lymphadenopathy:    She has no cervical adenopathy.       Right: No inguinal adenopathy present.       Left: No inguinal adenopathy present.  Neurological: She is alert and oriented to person, place, and time. No cranial nerve deficit. She exhibits normal muscle tone. Coordination normal.  Skin: Skin is warm and dry. No rash noted. She is not diaphoretic. No erythema.  Psychiatric: She has a normal mood and affect. Her behavior is normal. Judgment and thought content normal.       Assessment:     Persistent anal rectal pain and occasional prolapse of hemorrhoids despite normal bowel function and aggressive nonoperative management.    Plan:     At this point I think she needs something more aggressively done. The hemorrhoids seemed too low low to tolerate banding. I am skeptical injections are going to do much either. I think more towards [facility] hemorrhoidal ligation and pexy. I think that can recalibrate her hemorrhoids in view of the prolapse. She is much more interested in when I saw her in the spring. I noted we've been trying to be conservative that she's doing everything she can still having problems.  The anatomy & physiology of the  anorectal region was discussed.  The pathophysiology of hemorrhoids and differential diagnosis was discussed.  Natural history risks without surgery was discussed.   I stressed the importance of a bowel regimen to have daily soft bowel movements to minimize progression of disease.  Interventions such as sclerotherapy & banding were discussed.  The patient's symptoms are not adequately controlled by medicines and other non-operative treatments.  I feel the risks & problems of no surgery outweigh the operative risks; therefore, I recommended surgery to treat the hemorrhoids by ligation, pexy, and possible resection.  Risks such as bleeding, infection, need for further treatment, heart attack, death, and other risks were discussed.  Goals of post-operative recovery were discussed as well.  Possibility that this will not correct all symptoms was explained.  Post-operative pain, bleeding, constipation, and other problems after surgery were discussed.  We will work to minimize complications.   Educational handouts further explaining the pathology, treatment options, and bowel regimen were given as well.  Questions were answered.  The patient expresses understanding & wishes to proceed with surgery.

## 2010-11-21 ENCOUNTER — Other Ambulatory Visit: Payer: Self-pay

## 2010-11-21 MED ORDER — LOSARTAN POTASSIUM 100 MG PO TABS: ORAL_TABLET | ORAL | Status: DC

## 2010-11-29 ENCOUNTER — Other Ambulatory Visit (INDEPENDENT_AMBULATORY_CARE_PROVIDER_SITE_OTHER): Payer: Self-pay | Admitting: Surgery

## 2010-11-29 ENCOUNTER — Encounter (HOSPITAL_COMMUNITY): Payer: Medicare Other

## 2010-11-29 ENCOUNTER — Ambulatory Visit (HOSPITAL_COMMUNITY)
Admission: RE | Admit: 2010-11-29 | Discharge: 2010-11-29 | Disposition: A | Discharge status: Home / Self Care | Payer: Medicare Other | Source: Ambulatory Visit | Attending: Surgery | Admitting: Surgery

## 2010-11-29 DIAGNOSIS — K449 Diaphragmatic hernia without obstruction or gangrene: Secondary | ICD-10-CM | POA: Insufficient documentation

## 2010-11-29 DIAGNOSIS — K649 Unspecified hemorrhoids: Secondary | ICD-10-CM | POA: Insufficient documentation

## 2010-11-29 DIAGNOSIS — Z01812 Encounter for preprocedural laboratory examination: Secondary | ICD-10-CM | POA: Insufficient documentation

## 2010-11-29 DIAGNOSIS — Z01811 Encounter for preprocedural respiratory examination: Secondary | ICD-10-CM

## 2010-11-29 DIAGNOSIS — Z01818 Encounter for other preprocedural examination: Secondary | ICD-10-CM | POA: Insufficient documentation

## 2010-11-29 LAB — BASIC METABOLIC PANEL
CO2: 26 mEq/L (ref 19–32)
Chloride: 101 mEq/L (ref 96–112)
GFR calc non Af Amer: 75 mL/min — ABNORMAL LOW (ref >90)
Glucose, Bld: 94 mg/dL (ref 70–99)
Potassium: 4.2 mEq/L (ref 3.5–5.1)
Sodium: 136 mEq/L (ref 135–145)

## 2010-11-29 LAB — CBC
Hemoglobin: 14.9 g/dL (ref 12.0–15.0)
MCHC: 33.8 g/dL (ref 30.0–36.0)
RBC: 4.94 MIL/uL (ref 3.87–5.11)
WBC: 8.5 10*3/uL (ref 4.0–10.5)

## 2010-11-29 LAB — SURGICAL PCR SCREEN: Staphylococcus aureus: POSITIVE — AB

## 2010-12-01 ENCOUNTER — Ambulatory Visit (HOSPITAL_COMMUNITY)
Admission: RE | Admit: 2010-12-01 | Discharge: 2010-12-01 | Disposition: A | Discharge status: Home / Self Care | Payer: Medicare Other | Source: Ambulatory Visit | Attending: Surgery | Admitting: Surgery

## 2010-12-01 ENCOUNTER — Other Ambulatory Visit (INDEPENDENT_AMBULATORY_CARE_PROVIDER_SITE_OTHER): Payer: Self-pay | Admitting: Surgery

## 2010-12-01 DIAGNOSIS — Z01818 Encounter for other preprocedural examination: Secondary | ICD-10-CM | POA: Insufficient documentation

## 2010-12-01 DIAGNOSIS — Z01812 Encounter for preprocedural laboratory examination: Secondary | ICD-10-CM | POA: Insufficient documentation

## 2010-12-01 DIAGNOSIS — K649 Unspecified hemorrhoids: Secondary | ICD-10-CM

## 2010-12-01 DIAGNOSIS — K648 Other hemorrhoids: Secondary | ICD-10-CM | POA: Insufficient documentation

## 2010-12-01 DIAGNOSIS — I10 Essential (primary) hypertension: Secondary | ICD-10-CM | POA: Insufficient documentation

## 2010-12-03 NOTE — Op Note (Signed)
Connie Owens, Connie Owens NO.:  [id number]  MEDICAL RECORD NO.:  [id number]  LOCATION:  DAY[facility]                         FACILITY:  [facility]  PHYSICIAN:  Ardeth Sportsman, MD     DATE OF BIRTH:  [birth year]  DATE OF PROCEDURE:  12/01/2010 DATE OF DISCHARGE:                              OPERATIVE REPORT   PRIMARY CARE PHYSICIAN:  Titus Dubin. Alwyn Ren, MD, FACP, [facility]  GASTROENTEROLOGIST:  Melvia Heaps, MD, [facility]  SURGEON:  Ardeth Sportsman, MD.  PREOPERATIVE DIAGNOSIS:  Hemorrhoids with bleeding and prolapse.  POSTOPERATIVE DIAGNOSIS:  Hemorrhoids with bleeding and prolapse.  PROCEDURE PERFORMED: 1. Right posterior hemorrhoidectomy. 2. Transanal hemorrhoidal dearterialization [name]), hemorrhoidal     ligation and hemorrhoidopexy.  ANESTHESIA: 1. General anesthesia. 2. Bilateral ano-rectal block using liposomal bupivacaine.  SPECIMENS:  Right posterior hemorrhoid.  DRAINS:  None.  ESTIMATED BLOOD LOSS:  Minimal.  COMPLICATIONS:  None.  INDICATIONS:  Connie Owens is a 65 year old female who had struggled with bowel problems, though surviving with hemorrhoid issues.  She has gotten those under much better control but has had persistent problems. Despite improvement, he has returned with worsening hemorrhoid problems. I did not feel these were amenable demanding, and I recommended consideration of THD ligation.   Pathophysiology of hemorrhoids and differential diagnosis was discussed. Technique of THD and hemorrhoidectomy was discussed.  Risks, benefits, and alternatives were discussed.  Questions were answered, she agreed to proceed.  High likelihood of resolving the problems as long as she maintains severe bowel regimen, was also discussed as well.  OPERATIVE FINDINGS: She had a right posterior pedunculated hemorrhoid in the middle of a larger pile.  Her right anterior was prolapsing moderately large as well.  Left lateral was the least prolapsing, but still with  some congestion.  DESCRIPTION OF THE PROCEDURE:  Informed consent was confirmed.  The patient underwent general anesthesia without any difficulty.  Given the fact she had issues with infections, she requested and her primary care physician requested use of antibiotics, so she got 2 g cefoxitin.  She voided prior to coming to the operating room.  She had sequential compression devices active during the entire case.  She underwent general anesthesia without any difficulty.  She was positioned in prone Jackknife position.  Perineum was prepped and draped in sterile fashion. Surgical time-out confirmed the plan.  I did a rectal examination and gentle finger dilation to allow an anoscope in.  She had a pedunculated right posterior hemorrhoid that I was able to trim up using scissors.  I used THD Doppler anoscope to isolate the hemorrhoidal arteries feeding in to the hemorrhoidal piles.  I did figure-of-eight ligation using Doppler guidance in right posterior, right lateral, right anterior, and then the left side in a similar mirror image fashion.  Of note, her signals were actually a little bit rotated.  With each ligation stitch, I ran the stitch down distally just above the ano-rectal, and then tied that down to provide a good hemorrhoidopexy as well.  I did re-examination with Doppler and found a signal in the posterior midline and another one in the left anterior aspect.  I isolated the location and ligated that with  a figure-of-eight stitch.  I used 2-0 Vicryl for all these stitches.  Examination noted a tight rectum, but not overly narrowed.  Sphincter was normal.  I placed a Gelfoam bullet.  The patient is being extubated to go to recovery room.  I discussed postoperative care in detail with the patient in the office and just prior to surgery.  I have written instructions.  I am about to discuss with family as well per her request.     Ardeth Sportsman, MD     SCG/MEDQ  D:   12/01/2010  T:  12/01/2010  Job:  [id number]  Electronically Signed by Karie Soda MD on 12/03/2010 08:07:42 AM

## 2010-12-05 ENCOUNTER — Telehealth (INDEPENDENT_AMBULATORY_CARE_PROVIDER_SITE_OTHER): Payer: Self-pay | Admitting: General Surgery

## 2010-12-05 NOTE — Telephone Encounter (Signed)
Patient having a lot of pain s/p hemorrhoid surgery. Has been doing warm tub soaks, ice packs and using her pain medicine and still has a constant throbbing in her rectum. States warm tub soaks are soothing but still has throbbing in rectum while in tub. Moving her bowels ok. They are not hard. Taking oxycodone two every four hours. I advised to try ibuprofen between her does of pain medicine and to make sure she is on a stool softener and drinking plenty of water. I made an appt for her to be seen on Thursday just to check the area since the pain is not getting better at all. She will call if pain gets worse and follow up at appt. I told her I would let Dr Michaell Cowing know what is going on and let her know if he has any other suggestions.

## 2010-12-08 ENCOUNTER — Ambulatory Visit (INDEPENDENT_AMBULATORY_CARE_PROVIDER_SITE_OTHER): Payer: Medicare Other | Admitting: Surgery

## 2010-12-08 ENCOUNTER — Encounter (INDEPENDENT_AMBULATORY_CARE_PROVIDER_SITE_OTHER): Payer: Self-pay | Admitting: Surgery

## 2010-12-08 VITALS — BP 132/76 | HR 70 | Temp 97.2°F | Resp 16 | Ht 65.0 in | Wt 209.0 lb

## 2010-12-08 DIAGNOSIS — K648 Other hemorrhoids: Secondary | ICD-10-CM

## 2010-12-08 MED ORDER — OXYCODONE HCL 5 MG PO TABS: 5.0000 mg | ORAL_TABLET | ORAL | Status: AC | PRN

## 2010-12-08 NOTE — Progress Notes (Signed)
Subjective:     Patient ID: Connie Owens, female   DOB: 08-27-1945, 65 y.o.   MRN: [id number]  HPI  Patient Care Team: Pecola Lawless, MD as PCP - General Louis Meckel, MD as Consulting Physician (Gastroenterology)  This patient is a 65 y.o.female who presents today for surgical evaluation.   Procedure: THD hemorrhoidal ligation and pexy with right posterior hemorrhoidectomy.  The patient is now postoperative day #7. She notes moderate rectal pain. She is taking a fiber and having 2 bowel movements a day. She is doing sitz baths, oxycodone q.4 hours, ibuprofen 200mg  Q4 hours. She feels that the pain is going down but is not resolved. She was concerned and wished to be seen today. We fit her in today. She denies any fevers or chills or sweats. No bad rectal bleeding.  Past Medical History  Diagnosis Date  . URI (upper respiratory infection)   . Diarrhea     chronic  . UTI (urinary tract infection)   . Internal hemorrhoids   . External hemorrhoid   . Coccygeal pain   . Anal or rectal pain   . Left hip pain   . Corneal disorder   . Adverse drug reaction   . Postmenopausal status   . Dysphagia   . Back pain   . Dysmetabolic syndrome X   . Arthritis     osteo...right knee  . Carpal tunnel syndrome   . GERD (gastroesophageal reflux disease)   . Diverticular disease   . Hx of adenomatous colonic polyps   . Hernia     hiatal  . Anxiety state     NOS  . Hypertension     essen. NOS  . Rectal pain     Past Surgical History  Procedure Date  . Appendectomy   . Cholecystectomy   . Abdominal hysterectomy   . Breast surgery     lumpectomy  . Corneal transplants     bilateral  . Colonoscopy w/ polypectomy   . Lumbar spur removed   . Knee surgery   . Bunionectomy   . Ankle cystectomy   . Rectocele surgery   . 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 Hemorrhoid surgery 12/01/2010    THD hemorrhoid ligation/pexy    History   Social History  . Marital Status:  Married    Spouse Name: N/A    Number of Children: N/A  . Years of Education: N/A   Occupational History  . retired    Social History Main Topics  . Smoking status: Never Smoker   . Smokeless tobacco: Not on file  . Alcohol Use: Yes     rare  . Drug Use: No  . Sexually Active:    Other Topics Concern  . Not on file   Social History Narrative   Daily caffeineRegular exercise    Family History  Problem Relation Age of Onset  . Cancer Mother     breast  . Heart disease Father     CAD  . Stroke Father     CVA  . Dementia Father   . Diabetes Sister     3 sisters   . Heart disease Sister     1 sister CABG  . Cancer Sister     BREAST;PTE pre & post Dx of ca  . Diabetes Paternal Grandfather     Current outpatient prescriptions:mupirocin (BACTROBAN) 2 % ointment, , Disp: , Rfl: ;  diclofenac sodium (VOLTAREN) 1 % GEL,  Apply 1 application topically. Apply two times daily , Disp: , Rfl: ;  esomeprazole (NEXIUM) 40 MG capsule, Take 40 mg by mouth daily before breakfast.  , Disp: , Rfl: ;  estradiol (ESTRACE) 1 MG tablet, Take 1 mg by mouth daily.  , Disp: , Rfl:  gabapentin (NEURONTIN) 100 MG capsule, TAKE 1 TO 3 CAPSULES BY MOUTH EVERY 8 HOURS AS NEEDED, Disp: 90 capsule, Rfl: 1;  loperamide (IMODIUM) 2 MG capsule, Take 2 mg by mouth 4 (four) times daily as needed.  , Disp: , Rfl: ;  losartan (COZAAR) 100 MG tablet, 1 by mouth daily, Disp: 90 tablet, Rfl: 0;  oxyCODONE (OXY IR/ROXICODONE) 5 MG immediate release tablet, , Disp: , Rfl:  psyllium (METAMUCIL) 58.6 % powder, Take 1 packet by mouth daily.  , Disp: , Rfl: ;  zolpidem (AMBIEN CR) 12.5 MG CR tablet, Take 12.5 mg by mouth at bedtime.  , Disp: , Rfl:   Allergies  Allergen Reactions  . Iohexol   . Verapamil     REACTION: dizziness       Review of Systems  Constitutional: Negative for fever, chills and diaphoresis.  HENT: Negative for ear pain, sore throat and trouble swallowing.   Eyes: Negative for photophobia and  visual disturbance.  Respiratory: Negative for cough and choking.   Cardiovascular: Negative for chest pain and palpitations.  Gastrointestinal: Positive for rectal pain. Negative for nausea, vomiting, abdominal pain, diarrhea, constipation, blood in stool and anal bleeding.  Genitourinary: Negative for dysuria, urgency, frequency, decreased urine volume, vaginal bleeding, vaginal discharge, difficulty urinating and pelvic pain.  Musculoskeletal: Negative for myalgias and gait problem.  Skin: Negative for color change, pallor and rash.  Neurological: Negative for dizziness, speech difficulty, weakness and numbness.  Hematological: Negative for adenopathy.  Psychiatric/Behavioral: Negative for confusion and agitation. The patient is not nervous/anxious.        Objective:   Physical Exam  Constitutional: She is oriented to person, place, and time. She appears well-developed and well-nourished. No distress.  HENT:  Head: Normocephalic.  Mouth/Throat: Oropharynx is clear and moist. No oropharyngeal exudate.  Eyes: Conjunctivae and EOM are normal. Pupils are equal, round, and reactive to light. No scleral icterus.  Neck: Normal range of motion. No tracheal deviation present.  Cardiovascular: Normal rate and intact distal pulses.   Pulmonary/Chest: Effort normal. No respiratory distress. She exhibits no tenderness.  Abdominal: Soft. She exhibits no distension. There is no tenderness. Hernia confirmed negative in the right inguinal area and confirmed negative in the left inguinal area.       Incisions clean with normal healing ridges.  No hernias  Genitourinary: Vagina normal. No vaginal discharge found.       Perianal skin clean with good hygiene.  Mild ecchymosis.  No pruritis.  Normal sphincter tone.  No fissure.  No abscess/fistula.  No pilonidal disease.    Tolerates digital rectal exam.  Hemorrhoidal piles ligated and sensitive, esp R post (site of hemorrhoidectomy)   Musculoskeletal:  Normal range of motion. She exhibits no tenderness.  Lymphadenopathy:       Right: No inguinal adenopathy present.       Left: No inguinal adenopathy present.  Neurological: She is alert and oriented to person, place, and time. No cranial nerve deficit. She exhibits normal muscle tone. Coordination normal.  Skin: Skin is warm and dry. No rash noted. She is not diaphoretic.  Psychiatric: She has a normal mood and affect. Her behavior is normal.  Assessment:     POD# 7 THD hemorrhoidal ligation with persistent pain     Plan:     She seems to be most sensitive at the point of the right posterior hemorrhoidectomy. I tried to reassure her that this should improve. The rectal urgency that she is feeling as common as well with this procedure.  Continue warm soaks. Okay to add Preparation H and Tucks as needed. I offered a prescription for Analpram but she declined.  I renewed oxycodone #50. I noted she can go up to 10-15 mg every 4 hours. Consider switching to naproxewn 2 pills b.i.d. or increase ibuprofen to 800 mg but only every 6 hours.  Return to clinic in 2-3 weeks. If her pain totally resolved she can cancel the appointment, however, I am hesitant to release her until she's feeling better. She feels reassured

## 2010-12-08 NOTE — Patient Instructions (Addendum)
Managing Pain  Pain after surgery or related to activity is often due to strain/injury to muscle, tendon, nerves and/or incisions.  This pain is usually short-term and will improve in a few months.   Many people find it helpful to do the following things TOGETHER to help speed the process of healing and to get back to regular activity more quickly:  1. Avoid heavy physical activity a.  no lifting greater than 20 pounds b. Do not "push through" the pain.  Listen to your body and avoid positions and maneuvers than reproduce the pain c. Walking is okay as tolerated, but go slowly and stop when getting sore.  d. Remember: If it hurts to do it, then don't do it! 2. Take Anti-inflammatory medication  a. Take with food/snack around the clock for 1-2 weeks i. This helps the muscle and nerve tissues become less irritable and calm down faster b. Choose ONE of the following over-the-counter medications: i. Naproxen 220mg  tabs (ex. Aleve) 2 pills twice a day  ii. Ibuprofen 200mg  tabs (ex. Advil, Motrin) 4 pills with every meal and just before bedtime 3. Use a Heating pad or Ice/Cold Pack a. 4-6 times a day b. May use warm bath/hottub  or showers 4. Try Gentle Massage and/or Stretching  a. at the area of pain many times a day b. stop if you feel pain - do not overdo it  Try these steps together to help you body heal faster and avoid making things get worse.  Doing just one of these things may not be enough.    If you are not getting better after two weeks or are noticing you are getting worse, contact our office for further advice; we may need to re-evaluate you & see what other things we can do to help.    HEMORRHOIDS   The rectum is the last few inches of your colon, and it naturally stretches to hold stool.  Hemorrhoidal piles are natural clusters of blood vessels that help the rectum stretch to hold stool and allow bowel movements to eliminate feces.  Hemorrhoids are abnormally swollen blood  vessels in the rectum.  Too much pressure in the rectum causes hemorrhoids by forcing blood to stretch and bulge the walls of the veins, sometimes even rupturing them.  Hemorrhoids can become like varicose veins you might see on a person's legs. When bulging hemorrhoidal veins are irritated, they can swell, burn, itch, become very painful, and bleed. Once the rectal veins have been stretched out and hemorrhoids created, they are difficult to get rid of completely and tend to recur with less straining than it took to cause them in the first place. Fortunately, good habits and simple medical treatment usually control hemorrhoids well, and surgery is only recommended in unusually severe cases. Some of the most frequent causes of hemorrhoids:    Constant sitting    Straining with bowel movements (from constipation or hard stools)    Diarrhea    Sitting on the toilet for a long time    Severe coughing    Childbirth    Heavy Lifting  Types of Hemorrhoids:    Internal hemorrhoids usually don't hurt or itch; they are deep inside the rectum and usually have no sensation. However, internal hemorrhoids can bleed.  Such bleeding should not be ignored and mask blood from a dangerous source like colorectal cancer, so persistent rectal bleeding should be investigated with a colonoscopy.    External hemorrhoids cause most of the symptoms -  pain, burning, and itching. Unirritated hemorrhoids can look like small skin tags coming out of the anus.     Thrombosed hemorrhoids can form when a hemorrhoid blood vessel bursts and causes the hemorrhoid to swell.  A purple blood clot can form in it and become an excruciatingly painful lump at the anus. Because of these unpleasant symptoms, immediate incision and drainage by a surgeon at an office visit can provide much relief of the pain.    PREVENTION Avoiding the causes listed in above will prevent most cases of hemorrhoids, but this advice is sometimes hard to follow:  How  can you avoid sitting all day if you have a seated job? Also, we try to avoid coughing and diarrhea, but sometimes it's beyond your control.  Still, there are some practical hints to help:    If your main job activity is seated, always stand or walk during your breaks. Make it a point to stand and walk at least 5 minutes every hour and try to shift frequently in your chair to avoid direct rectal pressure.    Always exhale as you strain or lift. Don't hold your breath.    Treat coughing, diarrhea and constipation early since irritated hemorrhoids may soon follow.    Do not delay or try to prevent a bowel movement when the urge is present.   Exercise regularly (walking or jogging 60 minutes a day) to stimulate the bowels to move.   Avoid dry toilet paper when cleaning after bowel movements.  Moistened tissues such as baby wipes are less irritating.  Lightly pat the rectal area dry.  Using irrigating showers or bottle irrigation washing can more gently clean this sensitive area.   Keep the anal and genital area clean and  dry.  Talcum or baby powders can help   GET YOUR STOOLS SOFT.   This is the most important way to prevent irritated hemorrhoids.  Hard stools are like sandpaper to the anorectal canal and will cause more problems.   The goal: ONE SOFT BOWEL MOVEMENT A DAY!  To have soft, regular bowel movements:    Drink at least 8 tall glasses of water a day.     AVOID CONSTIPATION    Take plenty of fiber.  Fiber is the undigested part of plant food that passes into the colon, acting s "natures broom" to encourage bowel motility and movement.  Fiber can absorb and hold large amounts of water. This results in a larger, bulkier stool, which is soft and easier to pass. Work gradually over several weeks up to 6 servings a day of fiber (25g a day even more if needed) in the form of: o Vegetables -- Root (potatoes, carrots, turnips), leafy green (lettuce, salad greens, celery, spinach), or cooked high residue  (cabbage, broccoli, etc) o Fruit -- Fresh (unpeeled skin & pulp), Dried (prunes, apricots, cherries, etc ),  or stewed ( applesauce)  o Whole grain breads, pasta, etc (whole wheat)  o Bran cereals    Bulking Agents -- This type of water-retaining fiber generally is easily obtained each day by one of the following:  o Psyllium bran -- The psyllium plant is remarkable because its ground seeds can retain so much water. This product is available as Metamucil, Konsyl, Effersyllium, Per Diem Fiber, or the less expensive generic preparation in drug and health food stores. Although labeled a laxative, it really is not a laxative.  o Methylcellulose -- This is another fiber derived from wood which also retains  water. It is available as Citrucel. o Polyethylene Glycol - and "artificial" fiber commonly called Miralax or Glycolax.  It is helpful for people with gassy or bloated feelings with regular fiber o Flax Seed - a less gassy fiber than psyllium   No reading or other relaxing activity while on the toilet. If bowel movements take longer than 5 minutes, you are too constipated   Laxatives can be useful for a short period if constipation is severe o Osmotics (Milk of Magnesia, Fleets phosphosoda, Magnesium citrate, MiraLax, GoLytely) are safer than  o Stimulants (Senokot, Castor Oil, Dulcolax, Ex Lax)    o Do not take laxatives for more than 7days in a row.   Laxatives are not a good long-term solution as it can stress the intestine and colon and causes too much mineral and fluid losses.    If badly constipated, try a Bowel Retraining Program: o Do not use laxatives.  o Eat a diet high in roughage, such as bran cereals and leafy vegetables.  o Drink six (6) ounces of prune or apricot juice each morning.  o Eat two (2) large servings of stewed fruit each day.  o Take one (1) heaping dose of a bulking agent (ex. Metamucil, Citrucel, Miralax) twice a day.  o Use sugar-free sweetener when possible to avoid  excessive calories.  o Eat a normal breakfast.  o Set aside 15 minutes after breakfast to sit on the toilet, but do not strain to have a bowel movement.  o If you do not have a bowel movement by the third day, use an enema and repeat the above steps.    AVOID DIARRHEA o Switch to liquids and simpler foods for a few days to avoid stressing your intestines further. o Avoid dairy products (especially milk & ice cream) for a short time.  The intestines often can lose the ability to digest lactose when stressed. o Avoid foods that cause gassiness or bloating.  Typical foods include beans and other legumes, cabbage, broccoli, and dairy foods.  Every person has some sensitivity to other foods, so listen to our body and avoid those foods that trigger problems for you. o Adding fiber (Citrucel, Metamucil, psyllium, Miralax) gradually can help thicken stools by absorbing excess fluid and retrain the intestines to act more normally.  Slowly increase the dose over a few weeks.  Too much fiber too soon can backfire and cause cramping & bloating. o Probiotics (such as active yogurt, Align, etc) may help repopulate the intestines and colon with normal bacteria and calm down a sensitive digestive tract.  Most studies show it to be of mild help, though, and such products can be costly. o Medicines:   Bismuth subsalicylate (ex. Kayopectate, Pepto Bismol) every 30 minutes for up to 6 doses can help control diarrhea.  Avoid if pregnant.   Loperamide (Immodium) can slow down diarrhea.  Start with two tablets (4mg  total) first and then try one tablet every 6 hours.  Avoid if you are having fevers or severe pain.  If you are not better or start feeling worse, stop all medicines and call your doctor for advice o Call your doctor if you are getting worse or not better.  Sometimes further testing (cultures, endoscopy, X-ray studies, bloodwork, etc) may be needed to help diagnose and treat the cause of the diarrhea.   If these  preventive measures fail, you must take action right away! Hemorrhoids are one condition that can be mild in the morning and become  intolerable by nightfall.

## 2010-12-21 ENCOUNTER — Ambulatory Visit (INDEPENDENT_AMBULATORY_CARE_PROVIDER_SITE_OTHER): Payer: Medicare Other | Admitting: Internal Medicine

## 2010-12-21 ENCOUNTER — Encounter: Payer: Self-pay | Admitting: Internal Medicine

## 2010-12-21 DIAGNOSIS — E8881 Metabolic syndrome: Secondary | ICD-10-CM

## 2010-12-21 DIAGNOSIS — E785 Hyperlipidemia, unspecified: Secondary | ICD-10-CM | POA: Insufficient documentation

## 2010-12-21 DIAGNOSIS — M533 Sacrococcygeal disorders, not elsewhere classified: Secondary | ICD-10-CM

## 2010-12-21 DIAGNOSIS — I10 Essential (primary) hypertension: Secondary | ICD-10-CM

## 2010-12-21 MED ORDER — LOSARTAN POTASSIUM 100 MG PO TABS: ORAL_TABLET | ORAL | Status: DC

## 2010-12-21 MED ORDER — GABAPENTIN 100 MG PO CAPS: ORAL_CAPSULE | ORAL | Status: DC

## 2010-12-21 NOTE — Patient Instructions (Signed)
Preventive Health Care: Exercise  30-45  minutes a day, 3-4 days a week. Walking is especially valuable in preventing Osteoporosis. Eat a low-fat diet with lots of fruits and vegetables, up to 7-9 servings per day. Consume less than 30 grams of sugar per day from foods & drinks with High Fructose Corn Syrup as # 1,2,3 or #4 on label. Please  schedule fasting Labs : Lipids, hepatic panel,  TSH, A1c. (272.4, 790.29,995.20)  Please bring these instructions to that Lab appt.

## 2010-12-21 NOTE — Progress Notes (Signed)
Subjective:    Patient ID: Connie Owens, female    DOB: July 29, 1945, 65 y.o.   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 Glucose was 94; renal function was normal       Review of Systems FATIGUE  Onset: > 12 mos   Fatigue @ rest : yes    Primarily physical fatigue: yes Symptoms: Fever/ chills : no  Night sweats: no                                                                                            Hoarseness or swallowing dysfunction: no                                                                                           Bowel changes( constipation/ diarrhea): loose - watery stool; on Metamucil & Immodium as per Dr [name]                                                                                      Weight change: yes, 5# loss on purpose   Exertional chest pain: no  Cough: no  Hemoptysis: no  New medications: no  PND: no  Melena/ rectal bleeding: no  Adenopathy: no  Severe snoring: not severe  Daytime sleepiness: no  Skin / hair / nail changes: no  Temperature intolerance( heat/ cold) : no                                                                                                     Feeling depressed: no  Anhedonia: no  Altered appetite: no  Poor sleep/ Apnea :  no ; Dr Marylou Flesher Rxed Ambien. Apnea study recommended; not done to date Abnormal bruising / bleeding or enlarged lymph nodes: no  CBC and differential was normal 11/29/10. She inquired about a B12 deficiency                                 Objective:   Physical Exam Gen.: Healthy and well-nourished in appearance. Alert, appropriate and cooperative throughout exam.  Eyes: No corneal or conjunctival inflammation noted.  Extraocular motion intact.  Neck: No deformities, masses, or tenderness noted. Range of motion & . Thyroid normal. Lungs: Normal respiratory effort; chest expands symmetrically. Lungs are clear to auscultation without rales, wheezes, or increased work of breathing. Heart: Normal rate and rhythm. Normal S1 and S2. No gallop, click, or rub. S 4 w/o  murmur. Abdomen: Bowel sounds normal; abdomen soft and nontender. No masses, organomegaly or hernias noted. No AAA                                                                                     Musculoskeletal/extremities: Slight lordosis noted of  the thoracic  spine. No clubbing, cyanosis, or deformity noted. Joints normal. Nail health  Good. Slight lipidema Vascular: Carotid, radial artery, dorsalis pedis and  posterior tibial pulses are full and equal. No bruits present. Neurologic: Alert and oriented x3. Deep tendon reflexes symmetrical and normal.         Skin: Intact without suspicious lesions or rashes. Lymph: No cervical, axillary  lymphadenopathy present. Psych: Mood and affect are normal. Normally interactive                                                                                        Assessment & Plan:  #1 hypertension, excellent control   #2 Hyperglycemia, nonfasting. A1c monitoring indicated  #3 dyslipidemia without family history of premature coronary disease. LDL goal = < 160, ideally < 130.  #4 fatigue with normal CBC. Apnea of a weight has been recommended by her neurologist; I concur that this is appropriate.  Plan: See orders and recommendations

## 2010-12-26 ENCOUNTER — Ambulatory Visit (INDEPENDENT_AMBULATORY_CARE_PROVIDER_SITE_OTHER): Payer: Medicare Other | Admitting: Surgery

## 2010-12-26 ENCOUNTER — Encounter (INDEPENDENT_AMBULATORY_CARE_PROVIDER_SITE_OTHER): Payer: Self-pay | Admitting: Surgery

## 2010-12-26 VITALS — BP 138/86 | HR 60 | Temp 97.4°F | Resp 16 | Ht 65.0 in | Wt 208.5 lb

## 2010-12-26 DIAGNOSIS — K648 Other hemorrhoids: Secondary | ICD-10-CM

## 2010-12-26 MED ORDER — OXYCODONE HCL 5 MG PO TABS: 5.0000 mg | ORAL_TABLET | ORAL | Status: AC | PRN

## 2010-12-26 MED ORDER — LIDOCAINE-HYDROCORTISONE ACE 3-0.5 % RE CREA: 1.0000 | TOPICAL_CREAM | Freq: Two times a day (BID) | RECTAL | Status: DC

## 2010-12-26 NOTE — Patient Instructions (Addendum)
Decrease Metamucil to 2x/day  Add Neurontin 3x /day  Continue Aleve, Sitz baths  Try Anapram topical ointment   Managing Pain  Pain after surgery or related to activity is often due to strain/injury to muscle, tendon, nerves and/or incisions.  This pain is usually short-term and will improve in a few months.   Many people find it helpful to do the following things TOGETHER to help speed the process of healing and to get back to regular activity more quickly:  1. Avoid heavy physical activity a.  no lifting greater than 20 pounds b. Do not "push through" the pain.  Listen to your body and avoid positions and maneuvers than reproduce the pain c. Walking is okay as tolerated, but go slowly and stop when getting sore.  d. Remember: If it hurts to do it, then don't do it! 2. Take Anti-inflammatory medication  a. Take with food/snack around the clock for 1-2 weeks i. This helps the muscle and nerve tissues become less irritable and calm down faster b. Choose ONE of the following over-the-counter medications: i. Naproxen 220mg  tabs (ex. Aleve) 1-2 pills twice a day  ii. Ibuprofen 200mg  tabs (ex. Advil, Motrin) 3-4 pills with every meal and just before bedtime iii. Acetaminophen 500mg  tabs (Tylenol) 1-2 pills with every meal and just before bedtime 3. Use a Heating pad or Ice/Cold Pack a. 4-6 times a day b. May use warm bath/hottub  or showers 4. Try Gentle Massage and/or Stretching  a. at the area of pain many times a day b. stop if you feel pain - do not overdo it  Try these steps together to help you body heal faster and avoid making things get worse.  Doing just one of these things may not be enough.    If you are not getting better after two weeks or are noticing you are getting worse, contact our office for further advice; we may need to re-evaluate you & see what other things we can do to help.  GETTING TO GOOD BOWEL HEALTH. Irregular bowel habits such as constipation and  diarrhea can lead to many problems over time.  Having one soft bowel movement a day is the most important way to prevent further problems.  The anorectal canal is designed to handle stretching and feces to safely manage our ability to get rid of solid waste (feces, poop, stool) out of our body.  BUT, hard constipated stools can act like ripping concrete bricks and diarrhea can be a burning fire to this very sensitive area of our body, causing inflamed hemorrhoids, anal fissures, increasing risk is perirectal abscesses, abdominal pain/bloating, an making irritable bowel worse.     The goal: ONE SOFT BOWEL MOVEMENT A DAY!  To have soft, regular bowel movements:    Drink at least 8 tall glasses of water a day.     Take plenty of fiber.  Fiber is the undigested part of plant food that passes into the colon, acting s "natures broom" to encourage bowel motility and movement.  Fiber can absorb and hold large amounts of water. This results in a larger, bulkier stool, which is soft and easier to pass. Work gradually over several weeks up to 6 servings a day of fiber (25g a day even more if needed) in the form of: o Vegetables -- Root (potatoes, carrots, turnips), leafy green (lettuce, salad greens, celery, spinach), or cooked high residue (cabbage, broccoli, etc) o Fruit -- Fresh (unpeeled skin & pulp), Dried (prunes, apricots, cherries,  etc ),  or stewed ( applesauce)  o Whole grain breads, pasta, etc (whole wheat)  o Bran cereals    Bulking Agents -- This type of water-retaining fiber generally is easily obtained each day by one of the following:  o Psyllium bran -- The psyllium plant is remarkable because its ground seeds can retain so much water. This product is available as Metamucil, Konsyl, Effersyllium, Per Diem Fiber, or the less expensive generic preparation in drug and health food stores. Although labeled a laxative, it really is not a laxative.  o Methylcellulose -- This is another fiber derived from  wood which also retains water. It is available as Citrucel. o Polyethylene Glycol - and "artificial" fiber commonly called Miralax or Glycolax.  It is helpful for people with gassy or bloated feelings with regular fiber o Flax Seed - a less gassy fiber than psyllium   No reading or other relaxing activity while on the toilet. If bowel movements take longer than 5 minutes, you are too constipated   AVOID CONSTIPATION.  High fiber and water intake usually takes care of this.  Sometimes a laxative is needed to stimulate more frequent bowel movements, but    Laxatives are not a good long-term solution as it can wear the colon out. o Osmotics (Milk of Magnesia, Fleets phosphosoda, Magnesium citrate, MiraLax, GoLytely) are safer than  o Stimulants (Senokot, Castor Oil, Dulcolax, Ex Lax)    o Do not take laxatives for more than 7days in a row.    IF SEVERELY CONSTIPATED, try a Bowel Retraining Program: o Do not use laxatives.  o Eat a diet high in roughage, such as bran cereals and leafy vegetables.  o Drink six (6) ounces of prune or apricot juice each morning.  o Eat two (2) large servings of stewed fruit each day.  o Take one (1) heaping tablespoon of a psyllium-based bulking agent twice a day. Use sugar-free sweetener when possible to avoid excessive calories.  o Eat a normal breakfast.  o Set aside 15 minutes after breakfast to sit on the toilet, but do not strain to have a bowel movement.  o If you do not have a bowel movement by the third day, use an enema and repeat the above steps.    Controlling diarrhea o Switch to liquids and simpler foods for a few days to avoid stressing your intestines further. o Avoid dairy products (especially milk & ice cream) for a short time.  The intestines often can lose the ability to digest lactose when stressed. o Avoid foods that cause gassiness or bloating.  Typical foods include beans and other legumes, cabbage, broccoli, and dairy foods.  Every person has  some sensitivity to other foods, so listen to our body and avoid those foods that trigger problems for you. o Adding fiber (Citrucel, Metamucil, psyllium, Miralax) gradually can help thicken stools by absorbing excess fluid and retrain the intestines to act more normally.  Slowly increase the dose over a few weeks.  Too much fiber too soon can backfire and cause cramping & bloating. o Probiotics (such as active yogurt, Align, etc) may help repopulate the intestines and colon with normal bacteria and calm down a sensitive digestive tract.  Most studies show it to be of mild help, though, and such products can be costly. o Medicines:   Bismuth subsalicylate (ex. Kayopectate, Pepto Bismol) every 30 minutes for up to 6 doses can help control diarrhea.  Avoid if pregnant.   Loperamide (Immodium) can  slow down diarrhea.  Start with two tablets (4mg  total) first and then try one tablet every 6 hours.  Avoid if you are having fevers or severe pain.  If you are not better or start feeling worse, stop all medicines and call your doctor for advice o Call your doctor if you are getting worse or not better.  Sometimes further testing (cultures, endoscopy, X-ray studies, bloodwork, etc) may be needed to help diagnose and treat the cause of the diarrhea. o

## 2010-12-26 NOTE — Progress Notes (Signed)
Subjective:     Patient ID: Connie Owens, female   DOB: 1945/10/24, 65 y.o.   MRN: [id number]  HPI  Patient Care Team: Pecola Lawless, MD as PCP - General Louis Meckel, MD as Consulting Physician (Gastroenterology)  This patient is a 65 y.o.female who presents today for surgical evaluation.   Procedure: [facility] hemorrhoidal ligation/pexy 12/01/2010  Patient notes she does not feel much better. She is using Metamucil and Imodium. She has about 2-3 tablets a day. She is to have 10. Her she still has chronic renal soreness. Continues to use oxycodone. I did back off to 2-3 times a day. Continue to leave. The warm soaks. She is back to work. No backbleeding. Occasionally feels a little prolapsing. She is worried that she's not much better.   Past Medical History  Diagnosis Date  . URI (upper respiratory infection)   . Diarrhea     chronic  . UTI (urinary tract infection)   . Internal hemorrhoids   . External hemorrhoid   . Coccygeal pain   . Anal or rectal pain   . Left hip pain   . Corneal disorder   . Adverse drug reaction   . Postmenopausal status   . Dysphagia   . Back pain   . Dysmetabolic syndrome X   . Arthritis     osteo...right knee  . Carpal tunnel syndrome   . GERD (gastroesophageal reflux disease)   . Diverticular disease   . Hx of adenomatous colonic polyps   . Hernia     hiatal  . Anxiety state     NOS  . Hypertension     essen. NOS  . Rectal pain     Past Surgical History  Procedure Date  . Appendectomy   . Cholecystectomy   . Abdominal hysterectomy   . Breast surgery     lumpectomy  . Corneal transplants     bilateral  . Colonoscopy w/ polypectomy   . Lumbar spur removed   . Knee surgery   . Bunionectomy   . Ankle cystectomy   . Rectocele surgery   . 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 Hemorrhoid surgery 12/01/2010    THD hemorrhoid ligation/pexy    History   Social History  . Marital Status: Married    Spouse Name: N/A   Number of Children: N/A  . Years of Education: N/A   Occupational History  . retired    Social History Main Topics  . Smoking status: Never Smoker   . Smokeless tobacco: Never Used  . Alcohol Use: Yes     rare  . Drug Use: No  . Sexually Active:    Other Topics Concern  . Not on file   Social History Narrative   Daily caffeineRegular exercise    Family History  Problem Relation Age of Onset  . Cancer Mother     breast  . Heart disease Father     CAD  . Stroke Father     CVA  . Dementia Father   . Diabetes Sister     3 sisters   . Heart disease Sister     1 sister CABG  . Cancer Sister     BREAST;PTE pre & post Dx of ca  . Diabetes Paternal Grandfather     Current outpatient prescriptions:esomeprazole (NEXIUM) 40 MG capsule, Take 40 mg by mouth daily before breakfast.  , Disp: , Rfl: ;  estradiol (ESTRACE) 1 MG tablet, Take  1 mg by mouth daily.  , Disp: , Rfl: ;  gabapentin (NEURONTIN) 100 MG capsule, TAKE 1 TO 3 CAPSULES BY MOUTH EVERY 8 HOURS AS NEEDED, Disp: 270 capsule, Rfl: 1;  loperamide (IMODIUM) 2 MG capsule, Take 2 mg by mouth 4 (four) times daily as needed.  , Disp: , Rfl:  losartan (COZAAR) 100 MG tablet, 1 by mouth daily, Disp: 90 tablet, Rfl: 3;  oxyCODONE (OXY IR/ROXICODONE) 5 MG immediate release tablet, , Disp: , Rfl: ;  prednisoLONE acetate (PRED FORTE) 1 % ophthalmic suspension, Place 2 drops into the right eye 4 (four) times daily.  , Disp: , Rfl: ;  psyllium (METAMUCIL) 58.6 % powder, Take 1 packet by mouth daily.  , Disp: , Rfl:  zolpidem (AMBIEN) 10 MG tablet, Take 10 mg by mouth at bedtime.  , Disp: , Rfl:   Allergies  Allergen Reactions  . Iohexol     rash  . Verapamil     REACTION: dizziness       Review of Systems  Constitutional: Negative for fever, chills and diaphoresis.  HENT: Negative for ear pain, sore throat and trouble swallowing.   Eyes: Negative for photophobia and visual disturbance.  Respiratory: Negative for cough and  choking.   Cardiovascular: Negative for chest pain and palpitations.  Gastrointestinal: Positive for rectal pain. Negative for nausea, vomiting, abdominal pain, diarrhea, constipation, blood in stool, abdominal distention and anal bleeding.       2 BM / day.  Uses Metamucil  Genitourinary: Negative for dysuria, frequency and difficulty urinating.  Musculoskeletal: Negative for myalgias and gait problem.  Skin: Negative for color change, pallor and rash.  Neurological: Negative for dizziness, speech difficulty, weakness and numbness.  Hematological: Negative for adenopathy.  Psychiatric/Behavioral: Negative for confusion and agitation. The patient is not nervous/anxious.        Objective:   Physical Exam  Constitutional: She is oriented to person, place, and time. She appears well-developed and well-nourished. No distress.  HENT:  Head: Normocephalic.  Mouth/Throat: Oropharynx is clear and moist. No oropharyngeal exudate.  Eyes: Conjunctivae and EOM are normal. Pupils are equal, round, and reactive to light. No scleral icterus.  Neck: Normal range of motion. No tracheal deviation present.  Cardiovascular: Normal rate and intact distal pulses.   Pulmonary/Chest: Effort normal. No respiratory distress. She exhibits no tenderness.  Abdominal: Soft. She exhibits no distension. There is no tenderness. There is no rebound and no guarding. Hernia confirmed negative in the right inguinal area and confirmed negative in the left inguinal area.       Incisions clean with normal healing ridges.  No hernias  Genitourinary: Vagina normal. No vaginal discharge found.       Perianal skin clean with good hygiene.  No pruritis.  No pilonidal disease.  No fissure.  No abscess/fistula.    Tolerates digital and anoscopic rectal exam.  Normal sphincter tone.   Hemorrhoidal piles OK.  Stitches from [facility] intact.  R posterior a little raw.   A few small external hem tags.  No prolapse w Valsalva     Musculoskeletal: Normal range of motion. She exhibits no tenderness.  Lymphadenopathy:       Right: No inguinal adenopathy present.       Left: No inguinal adenopathy present.  Neurological: She is alert and oriented to person, place, and time. No cranial nerve deficit. She exhibits normal muscle tone. Coordination normal.  Skin: Skin is warm and dry. No rash noted. She is not  diaphoretic.  Psychiatric: She has a normal mood and affect. Her behavior is normal.       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 Healing overall.    Plan:     I think she is actually healing. I think she is improving. Her chronic use is less. She is in better spirits.  I recommend she back off on the Metamucil to maybe t.i.d. In that way should have 1-2 tablets a day. She struggles with sensitivity her bowels. She says this is the best ever.  AnaMantle for topical control.   Restart Neurontin for her chronic pain and chronic anal pain to see if that helps.  Renewed oxycodone #50.  Continue Aleve and sitz baths.   Next return to clinic in 3 weeks to see if she is improved. She hesitated for that. I just noted I am cautious to sign off on her if she still having a lot of discomfort. She does feel reassured overall. She will call me if things are not getting better. I feel reassured that there is no evidence of any abscess or fistula or any other pathology going on. Given the fact that she's had pain pre-severe for 3 years, it does not surprise me she is asymptomatic 3 weeks. I think she needs more time. She feels reassured.

## 2011-01-11 ENCOUNTER — Encounter (INDEPENDENT_AMBULATORY_CARE_PROVIDER_SITE_OTHER): Payer: Medicare Other | Admitting: Surgery

## 2011-01-19 ENCOUNTER — Other Ambulatory Visit: Payer: Self-pay | Admitting: Internal Medicine

## 2011-01-19 DIAGNOSIS — R7309 Other abnormal glucose: Secondary | ICD-10-CM

## 2011-01-19 DIAGNOSIS — E785 Hyperlipidemia, unspecified: Secondary | ICD-10-CM

## 2011-01-19 DIAGNOSIS — T887XXA Unspecified adverse effect of drug or medicament, initial encounter: Secondary | ICD-10-CM

## 2011-01-20 ENCOUNTER — Encounter: Payer: Self-pay | Admitting: Family Medicine

## 2011-01-20 ENCOUNTER — Ambulatory Visit
Admission: RE | Admit: 2011-01-20 | Discharge: 2011-01-20 | Disposition: A | Discharge status: Home / Self Care | Payer: Medicare Other | Source: Ambulatory Visit | Attending: Family Medicine | Admitting: Family Medicine

## 2011-01-20 ENCOUNTER — Other Ambulatory Visit (INDEPENDENT_AMBULATORY_CARE_PROVIDER_SITE_OTHER): Payer: Medicare Other

## 2011-01-20 ENCOUNTER — Ambulatory Visit (INDEPENDENT_AMBULATORY_CARE_PROVIDER_SITE_OTHER): Payer: Medicare Other | Admitting: Family Medicine

## 2011-01-20 ENCOUNTER — Other Ambulatory Visit: Payer: Self-pay | Admitting: Family Medicine

## 2011-01-20 VITALS — BP 132/70 | HR 58 | Temp 97.8°F | Wt 213.0 lb

## 2011-01-20 DIAGNOSIS — L0291 Cutaneous abscess, unspecified: Secondary | ICD-10-CM

## 2011-01-20 DIAGNOSIS — T887XXA Unspecified adverse effect of drug or medicament, initial encounter: Secondary | ICD-10-CM

## 2011-01-20 DIAGNOSIS — M79602 Pain in left arm: Secondary | ICD-10-CM

## 2011-01-20 DIAGNOSIS — Z0000 Encounter for general adult medical examination without abnormal findings: Secondary | ICD-10-CM

## 2011-01-20 DIAGNOSIS — R7309 Other abnormal glucose: Secondary | ICD-10-CM

## 2011-01-20 DIAGNOSIS — L0390 Cellulitis, unspecified: Secondary | ICD-10-CM

## 2011-01-20 DIAGNOSIS — E785 Hyperlipidemia, unspecified: Secondary | ICD-10-CM

## 2011-01-20 DIAGNOSIS — M79609 Pain in unspecified limb: Secondary | ICD-10-CM

## 2011-01-20 LAB — HEPATIC FUNCTION PANEL
ALT: 14 U/L (ref 0–35)
Alkaline Phosphatase: 64 U/L (ref 39–117)
Bilirubin, Direct: 0.1 mg/dL (ref 0.0–0.3)
Total Protein: 6.7 g/dL (ref 6.0–8.3)

## 2011-01-20 LAB — LIPID PANEL
Cholesterol: 200 mg/dL (ref 0–200)
VLDL: 35.6 mg/dL (ref 0.0–40.0)

## 2011-01-20 LAB — TSH: TSH: 2.06 u[IU]/mL (ref 0.35–5.50)

## 2011-01-20 MED ORDER — CEPHALEXIN 500 MG PO CAPS: 500.0000 mg | ORAL_CAPSULE | Freq: Four times a day (QID) | ORAL | Status: AC

## 2011-01-20 NOTE — Progress Notes (Signed)
Addended by: Arnette Norris on: 01/20/2011 10:08 AM   Modules accepted: Orders

## 2011-01-20 NOTE — Progress Notes (Signed)
12  

## 2011-01-20 NOTE — Progress Notes (Signed)
  Subjective:    Patient ID: Connie Owens, female    DOB: 01-31-1946, 65 y.o.   MRN: [id number]  HPI Pt here c/o pain ,reddness L arm for 2 weeks.  She is s/p shoulder dislocation and IV was put in that arm oct 18th.  No cp, sob.  No fevers.   Review of Systems    as above Objective:   Physical Exam  Constitutional: She appears well-developed and well-nourished.  Skin:       L antecubital fossa----+ errythema and hot to touch                                 Tender to touch  Psychiatric: She has a normal mood and affect. Her behavior is normal.          Assessment & Plan:  Cellulitis---r/o dvt            Check doppler UE             Keflex for 10 days

## 2011-01-20 NOTE — Patient Instructions (Signed)

## 2011-01-27 ENCOUNTER — Encounter: Payer: Self-pay | Admitting: Internal Medicine

## 2011-01-27 ENCOUNTER — Ambulatory Visit (INDEPENDENT_AMBULATORY_CARE_PROVIDER_SITE_OTHER): Payer: Medicare Other | Admitting: Internal Medicine

## 2011-01-27 DIAGNOSIS — E785 Hyperlipidemia, unspecified: Secondary | ICD-10-CM

## 2011-01-27 DIAGNOSIS — E8881 Metabolic syndrome: Secondary | ICD-10-CM

## 2011-01-27 DIAGNOSIS — IMO0002 Reserved for concepts with insufficient information to code with codable children: Secondary | ICD-10-CM

## 2011-01-27 NOTE — Assessment & Plan Note (Signed)
Her LDL is minimally elevated but well within goal of less than 160 as determined by her advanced cholesterol testing. Nutritional intervention is recommended because of the rising triglycerides with long-term diabetes risk. No medication is warranted. HDL is protective

## 2011-01-27 NOTE — Patient Instructions (Addendum)
Eat a low-fat diet with lots of fruits and vegetables, up to 7-9 servings per day. Avoid obesity; your goal is waist measurement < 35 inches.Consume less than 35 grams of sugar per day from foods & drinks with High Fructose Corn Sugar as #1,2,3 or # 4 on label. Follow the low carb nutrition program in [employer] as closely as possible to prevent Diabetes . White carbohydrates (potatoes, rice, bread, and pasta) have a high spike of sugar and a high load of sugar. For example a  baked potato has a cup of sugar and a  french fry  2 teaspoons of sugar. Yams, wild  rice, whole grained bread &  wheat pasta have been much lower spike and load of  sugar. Portions should be the size of a deck of cards or your palm.  Please  schedule fasting Labs :Lipids in 4-6 months. PLEASE BRING THESE INSTRUCTIONS TO FOLLOW UP  LAB APPOINTMENT.This will guarantee correct labs are drawn, eliminating need for repeat blood sampling ( needle sticks ! ). Diagnoses /Codes: 277.7  Use warm moist compresses to 3 times a day to the affected area.  Zicam Melts or Zinc lozenges ; vitamin C 2000 mg daily; & Echinacea for 4-7 days. Report fever, exudate("pus") or progressive pain.

## 2011-01-27 NOTE — Assessment & Plan Note (Signed)
Her A1c is in the nondiabetic range. Nutritional intervention should prevent diabetes and result in achieving resolution of the metabolic syndrome risks.

## 2011-01-27 NOTE — Progress Notes (Signed)
Subjective:    Patient ID: Connie Owens, female    DOB: Aug 10, 1945, 65 y.o.   MRN: [id number]  HPI #1  F/U of Skin Changes: 12/7 OV reviewed. She was placed on cephalexin for presumed cellulitis of the left antecubital area. She has had ecchymosis in this area postop 10/18; the interval between that event and presentation of  possible cellulitis suggests that they're unrelated. The venous Doppler of her left arm was negative for deep venous thrombosis Course: swelling resolved , minimal redness. At least 50 % improved Present symptoms: Pruritis: no  Tenderness: yes, minimally but no longer painful  Red Flags Feeling ill: yes, as separate process( see below)  Fever: no but chills  #2 New Acute Illness, unrelated to #1 Onset/symptoms:12/12 as diffuse arthralgias & myalgias Exposures (illness/environmental/extrinsic):daughter had Flu,in conatct 12/9. Note : she had Flu shot 10/12 Progression of symptoms:to chills w/o fever or sweats Treatments/response:Tylenol, Benadryl with some benefit Present symptoms: Frontal headache:no Facial pain:no Nasal purulence:no Sore throat:slight Dental pain:no Lymphadenopathy:no Wheezing/shortness of breath:no Cough/sputum/hemoptysis:slight dry cough Associated extrinsic/allergic symptoms:itchy eyes/ sneezing:no Past medical history: Seasonal allergies: yes/asthma:no Smoking history:never  #3 she also is here for followup of her labs. Lipids 12/7 revealed triglycerides of 178; these were 151 when last checked. LDL was 114. Liver function, A1c, TSH are all normal or at goal. Significantly her HDL is 50.5 which is protective. As expected has decreased as her triglycerides have elevated. Also is expected that the LDL has increased. Nutritional intervention was discussed with her.  She is on no statin or medications for hyperglycemia. She is on no specific diet               Review of Systems     Objective:   Physical Exam General  appearance is of good health and nourishment; no acute distress or increased work of breathing is present.  No  lymphadenopathy about the head, neck, epitrochlear area or axilla noted.   Eyes: No conjunctival inflammation or lid edema is present. There is no scleral icterus.  Ears:  External ear exam shows no significant lesions or deformities.  Otoscopic examination reveals clear canals, tympanic membranes are intact bilaterally without bulging, retraction, inflammation or discharge.  Nose:  External nasal examination shows no deformity or inflammation. Nasal mucosa are pink and moist without lesions or exudates. No septal dislocation .No obstruction to airflow.   Oral exam: Upper plate & lower partial; lips and gums are healthy appearing.There is no oropharyngeal erythema or exudate noted.   Neck:  No deformities, thyromegaly, masses, or tenderness noted.   Supple with full range of motion without pain.   Heart:  Normal rate and regular rhythm. S1 and S2 normal without gallop, murmur, click, rub S 4 Lungs:Chest clear to auscultation; no wheezes, rhonchi,rales ,or rubs present.No increased work of breathing.    Extremities:  No cyanosis  or clubbing  noted .Ankle lipedema. Nail health is good  Neuro: No tremor present; DTRs normal; light touch normal over feet   Skin: Warm & dry w/o jaundice or tenting. There is minimal tenderness and very faint erythema at the left antecubital fossa.            Assessment & Plan:   #1 cellulitis left antecubital area; subjective and clinical improvement. Cephalexin will be continued for 10 days total . Warm compresses would be recommended along with elevation of the arm as much as possible.  #2 recent viral type picture with no evidence of active infection or complications.  See patient recommendations

## 2011-02-09 ENCOUNTER — Other Ambulatory Visit (INDEPENDENT_AMBULATORY_CARE_PROVIDER_SITE_OTHER): Payer: Self-pay | Admitting: Surgery

## 2011-02-09 NOTE — Telephone Encounter (Signed)
Can this pt have refill on this med

## 2011-02-15 DIAGNOSIS — G4700 Insomnia, unspecified: Secondary | ICD-10-CM | POA: Diagnosis not present

## 2011-02-15 DIAGNOSIS — E349 Endocrine disorder, unspecified: Secondary | ICD-10-CM | POA: Diagnosis not present

## 2011-02-15 DIAGNOSIS — I10 Essential (primary) hypertension: Secondary | ICD-10-CM | POA: Diagnosis not present

## 2011-02-23 ENCOUNTER — Encounter (INDEPENDENT_AMBULATORY_CARE_PROVIDER_SITE_OTHER): Payer: Medicare Other | Admitting: Surgery

## 2011-02-27 ENCOUNTER — Ambulatory Visit (INDEPENDENT_AMBULATORY_CARE_PROVIDER_SITE_OTHER): Payer: Medicare Other | Admitting: Gastroenterology

## 2011-02-27 ENCOUNTER — Encounter: Payer: Self-pay | Admitting: Gastroenterology

## 2011-02-27 VITALS — BP 112/86 | HR 76 | Ht 65.0 in | Wt 208.0 lb

## 2011-02-27 DIAGNOSIS — R197 Diarrhea, unspecified: Secondary | ICD-10-CM | POA: Diagnosis not present

## 2011-02-27 MED ORDER — ESOMEPRAZOLE MAGNESIUM 40 MG PO CPDR: 40.0000 mg | DELAYED_RELEASE_CAPSULE | Freq: Every day | ORAL | Status: DC

## 2011-02-27 NOTE — Patient Instructions (Addendum)
Research will be speaking with you today Your 90 days supply of Nexium will be sent to your pharmacy

## 2011-02-27 NOTE — Progress Notes (Signed)
History of Present Illness:  Connie Owens has returned for reevaluation of diarrhea. She has a history of IBS/diarrhea for which she takes cholestyramine and Imodium. Symptoms are moderately well-controlled. She typically has a loose stool in the morning followed by 5-6 stools that become progressively more watery. She denies rectal bleeding. Colonoscopy 2007 demonstrated a adenomatous polyp. Sigmoidoscopy last year including biopsies was negative. Symptoms are not related to any particular foods. She complains of bloating with excess gas.  Since her last visit she underwent surgical hemorrhoidectomy.    Review of Systems: Pertinent positive and negative review of systems were noted in the above HPI section. All other review of systems were otherwise negative.    Current Medications, Allergies, Past Medical History, Past Surgical History, Family History and Social History were reviewed in [employer] electronic medical record  Vital signs were reviewed in today's medical record. Physical Exam: General: Well developed , well nourished, no acute distress Head: Normocephalic and atraumatic Eyes:  sclerae anicteric, EOMI Ears: Normal auditory acuity Mouth: No deformity or lesions Lungs: Clear throughout to auscultation Heart: Regular rate and rhythm; no murmurs, rubs or bruits Abdomen: Soft, non tender and non distended. No masses, hepatosplenomegaly or hernias noted. Normal Bowel sounds Rectal:deferred Musculoskeletal: Symmetrical with no gross deformities  Pulses:  Normal pulses noted Extremities: No clubbing, cyanosis, edema or deformities noted Neurological: Alert oriented x 4, grossly nonfocal Psychological:  Alert and cooperative. Normal mood and affect

## 2011-02-27 NOTE — Assessment & Plan Note (Addendum)
She continues to have diarrhea related to IBS, moderately controlled.  Recommendations #1 patient will consider enrollment in a IBS trial. Failing that , I would consider a trial of lotronex

## 2011-03-17 ENCOUNTER — Ambulatory Visit: Payer: Medicare Other

## 2011-03-21 ENCOUNTER — Telehealth: Payer: Self-pay

## 2011-03-21 NOTE — Telephone Encounter (Signed)
Spoke with pt and she did not qualify for the Diarrhea/IBS study. She is complaining of a lot of burning in her stomach. States she feels like her belly is on fire and it goes down to her intestines. She states this is very painful. Pt is on Nexium 40mg  daily but she does not feel like the pain is acid reflux.

## 2011-03-21 NOTE — Telephone Encounter (Signed)
Spoke with pt about Lotronex. Pt wants to call her insurance company and see how much the medication will be. Pt will call us back.

## 2011-03-21 NOTE — Telephone Encounter (Signed)
Begin lotronex 0.5- bid D/c if constipation occurs OV 2 weeks

## 2011-03-22 ENCOUNTER — Encounter (INDEPENDENT_AMBULATORY_CARE_PROVIDER_SITE_OTHER): Payer: Self-pay | Admitting: Surgery

## 2011-03-22 ENCOUNTER — Ambulatory Visit (INDEPENDENT_AMBULATORY_CARE_PROVIDER_SITE_OTHER): Payer: Medicare Other | Admitting: Surgery

## 2011-03-22 ENCOUNTER — Telehealth: Payer: Self-pay | Admitting: Gastroenterology

## 2011-03-22 VITALS — BP 124/72 | HR 66 | Temp 97.8°F | Resp 18 | Ht 65.0 in | Wt 210.6 lb

## 2011-03-22 DIAGNOSIS — K648 Other hemorrhoids: Secondary | ICD-10-CM

## 2011-03-22 DIAGNOSIS — K589 Irritable bowel syndrome without diarrhea: Secondary | ICD-10-CM | POA: Insufficient documentation

## 2011-03-22 MED ORDER — HYDROCORTISONE ACE-PRAMOXINE 2.5-1 % RE CREA: TOPICAL_CREAM | Freq: Four times a day (QID) | RECTAL | Status: AC

## 2011-03-22 NOTE — Progress Notes (Signed)
Subjective:     Patient ID: Connie Owens, female   DOB: 1945-07-31, 66 y.o.   MRN: [id number]  HPI   Patient Care Team: Pecola Lawless, MD as PCP - General Louis Meckel, MD as Consulting Physician (Gastroenterology)  This patient is a 66 y.o.female who presents today for surgical evaluation.   Procedure: [facility] hemorrhoidal ligation/pexy 12/01/2010  Patient notes her diarrhea/IBS has worsened.  She had been having good control, but it fell apart around the [place].  Has not gotten back on fiber.  Saw GI.  Dr. [name] tried to get her on another IBS trial but she was not a candidate.  She is being offered a new medication. However the costs is high so she has balked on starting it.  No more prolapse. No more anorectal bleeding. However, she does get some perianal discomfort. Trying to use Preparation H but not strong enough. Ran out of the AnaMantle medication. Pharmacy gave her suppositories as opposed to cream.  Started using Pepto Bismol & that helped calm the BMs a little  Past Medical History  Diagnosis Date  . URI (upper respiratory infection)   . Diarrhea     chronic  . UTI (urinary tract infection)   . Internal hemorrhoids   . External hemorrhoid   . Coccygeal pain   . Anal or rectal pain   . Left hip pain   . Corneal disorder   . Adverse drug reaction   . Postmenopausal status   . Dysphagia   . Back pain   . Dysmetabolic syndrome X   . Arthritis     osteo...right knee  . Carpal tunnel syndrome   . GERD (gastroesophageal reflux disease)   . Diverticular disease   . Hx of adenomatous colonic polyps   . Hernia     hiatal  . Anxiety state     NOS  . Hypertension     essen. NOS  . Rectal pain   . Shoulder fracture     Past Surgical History  Procedure Date  . Appendectomy   . Cholecystectomy   . Abdominal hysterectomy   . Breast surgery     lumpectomy; right  . Corneal transplants     bilateral  . Colonoscopy w/ polypectomy   . Lumbar spur removed   .  Total knee arthroplasty     right  . Bunionectomy   . Ankle cystectomy     right  . Rectocele surgery   . 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 Hemorrhoid surgery 12/01/2010    THD hemorrhoid ligation/pexy    History   Social History  . Marital Status: Married    Spouse Name: N/A    Number of Children: N/A  . Years of Education: N/A   Occupational History  . retired    Social History Main Topics  . Smoking status: Never Smoker   . Smokeless tobacco: Never Used  . Alcohol Use: Yes     rare  . Drug Use: No  . Sexually Active: Not on file   Other Topics Concern  . Not on file   Social History Narrative   Daily caffeineRegular exercise    Family History  Problem Relation Age of Onset  . Breast cancer Mother   . Heart disease Father   . Stroke Father   . Dementia Father   . Diabetes Sister     3 sisters   . Heart disease Sister     1  sister CABG  . Breast cancer Sister     PTE pre & post Dx of ca  . Diabetes Paternal Grandfather   . Colon cancer Neg Hx   . Prostate cancer Father     Current outpatient prescriptions:esomeprazole (NEXIUM) 40 MG capsule, Take 1 capsule (40 mg total) by mouth daily before breakfast., Disp: 90 capsule, Rfl: 3;  estradiol (ESTRACE) 1 MG tablet, Take 1 mg by mouth daily.  , Disp: , Rfl: ;  gabapentin (NEURONTIN) 100 MG capsule, TAKE 1 TO 3 CAPSULES BY MOUTH EVERY 8 HOURS AS NEEDED, Disp: 270 capsule, Rfl: 1 hydrocortisone-pramoxine (ANALPRAM-HC) 2.5-1 % rectal cream, Place rectally 4 (four) times daily. For irritated and painful hemorrhoids, Disp: 15 g, Rfl: 2;  loperamide (IMODIUM) 2 MG capsule, Take 2 mg by mouth 4 (four) times daily as needed.  , Disp: , Rfl: ;  losartan (COZAAR) 100 MG tablet, 1 by mouth daily, Disp: 90 tablet, Rfl: 3 prednisoLONE acetate (PRED FORTE) 1 % ophthalmic suspension, Place 2 drops into the right eye 4 (four) times daily.  , Disp: , Rfl: ;  psyllium (METAMUCIL) 58.6 % powder, Take 1 packet by mouth 3  (three) times daily as needed. , Disp: , Rfl: ;  zolpidem (AMBIEN) 10 MG tablet, Take 10 mg by mouth at bedtime.  , Disp: , Rfl:   Allergies  Allergen Reactions  . Iohexol     rash  . Verapamil     REACTION: dizziness       Review of Systems  Constitutional: Negative for fever, chills and diaphoresis.  HENT: Negative for ear pain, sore throat and trouble swallowing.   Eyes: Negative for photophobia and visual disturbance.  Respiratory: Negative for cough and choking.   Cardiovascular: Negative for chest pain and palpitations.  Gastrointestinal: Positive for rectal pain. Negative for nausea, vomiting, abdominal pain, diarrhea, constipation, blood in stool, abdominal distention and anal bleeding.  Genitourinary: Negative for dysuria, frequency and difficulty urinating.  Musculoskeletal: Negative for myalgias and gait problem.  Skin: Negative for color change, pallor and rash.  Neurological: Negative for dizziness, speech difficulty, weakness and numbness.  Hematological: Negative for adenopathy.  Psychiatric/Behavioral: Negative for confusion and agitation. The patient is not nervous/anxious.        Objective:   Physical Exam  Constitutional: She is oriented to person, place, and time. She appears well-developed and well-nourished. No distress.  HENT:  Head: Normocephalic.  Mouth/Throat: Oropharynx is clear and moist. No oropharyngeal exudate.  Eyes: Conjunctivae and EOM are normal. Pupils are equal, round, and reactive to light. No scleral icterus.  Neck: Normal range of motion. No tracheal deviation present.  Cardiovascular: Normal rate and intact distal pulses.   Pulmonary/Chest: Effort normal. No respiratory distress. She exhibits no tenderness.  Abdominal: Soft. She exhibits no distension. There is no tenderness. There is no rebound and no guarding. Hernia confirmed negative in the right inguinal area and confirmed negative in the left inguinal area.       Incisions clean  with normal healing ridges.  No hernias  Genitourinary: Vagina normal. No vaginal discharge found.       Perianal skin clean with good hygiene.  No pruritis.  No pilonidal disease.  No fissure.  No abscess/fistula.    Hemorrhoidal piles OK.  R anterior mildly enlarged - most sensitive area.  A small R posterior external hem tag.  No prolapse w Valsalva   Musculoskeletal: Normal range of motion. She exhibits no tenderness.  Lymphadenopathy:  Right: No inguinal adenopathy present.       Left: No inguinal adenopathy present.  Neurological: She is alert and oriented to person, place, and time. No cranial nerve deficit. She exhibits normal muscle tone. Coordination normal.  Skin: Skin is warm and dry. No rash noted. She is not diaphoretic.  Psychiatric: She has a normal mood and affect. Her behavior is normal.       Assessment:     More stable hems with less rectal pain, no more bleeding, & no prolapse after THD  Worsening IBS.    Plan:     I recommend she start back on the Metamucil 1-2x/day.  Use Immodium or Lomotil 1-2 tablets a day. That seemed to work best.  Tama Gander slowly.  She struggles with sensitivity her bowels.  F/u Dr. [name] / [facility] to see what else can be done since the key to avoiding more anorectal/hemorrhoid flares is good BM control & 1 BM/day.  O/w the hems will come back & she will need more interventions  AnalPram for topical control.   Continue PRN Aleve and sitz baths.   RTC PRN.  She expressed appreciation

## 2011-03-22 NOTE — Telephone Encounter (Signed)
Do we have samples? If so please give them to her. If not, contact the drug company to see whether they have vouchers.

## 2011-03-22 NOTE — Patient Instructions (Signed)

## 2011-03-22 NOTE — Telephone Encounter (Signed)
See previous telephone note. 

## 2011-03-22 NOTE — Telephone Encounter (Signed)
Pt called her insurance company and she states she cannot afford the Lotronex. Pt wants to know if there is something cheaper that Dr. [name] can recommend. Dr. [name] please advise.

## 2011-03-23 NOTE — Telephone Encounter (Signed)
She did. See my first note that states she did not qualify for it.

## 2011-03-23 NOTE — Telephone Encounter (Signed)
Spoke with pt and she does not want to try the small amount of the medication. States even if the small amount did work she could not afford to continue to take it if it did work.  Pt is requesting that you recommend another medication that would be cheaper. Please advise.

## 2011-03-23 NOTE — Telephone Encounter (Signed)
We do not get samples of this drug and unfortunately since she is medicare she cannot use the vouchers/savings program.

## 2011-03-23 NOTE — Telephone Encounter (Signed)
Give her a Rx for 14 pills only with several renewels to determine if med works for her

## 2011-03-23 NOTE — Telephone Encounter (Signed)
She can take cholestyramine up to 4 times a day. Did she consider enrollment in a clinical trial for diarrhea?

## 2011-04-24 ENCOUNTER — Other Ambulatory Visit: Payer: Self-pay | Admitting: Internal Medicine

## 2011-04-25 NOTE — Telephone Encounter (Signed)
Prescription sent to pharmacy.

## 2011-05-06 DIAGNOSIS — M4310 Spondylolisthesis, site unspecified: Secondary | ICD-10-CM | POA: Diagnosis not present

## 2011-05-09 DIAGNOSIS — H01009 Unspecified blepharitis unspecified eye, unspecified eyelid: Secondary | ICD-10-CM | POA: Diagnosis not present

## 2011-05-09 DIAGNOSIS — H40019 Open angle with borderline findings, low risk, unspecified eye: Secondary | ICD-10-CM | POA: Diagnosis not present

## 2011-05-17 DIAGNOSIS — M5126 Other intervertebral disc displacement, lumbar region: Secondary | ICD-10-CM | POA: Diagnosis not present

## 2011-05-24 DIAGNOSIS — H18609 Keratoconus, unspecified, unspecified eye: Secondary | ICD-10-CM | POA: Diagnosis not present

## 2011-06-12 DIAGNOSIS — M4310 Spondylolisthesis, site unspecified: Secondary | ICD-10-CM | POA: Diagnosis not present

## 2011-07-11 DIAGNOSIS — H18609 Keratoconus, unspecified, unspecified eye: Secondary | ICD-10-CM | POA: Diagnosis not present

## 2011-07-11 DIAGNOSIS — H1820 Unspecified corneal edema: Secondary | ICD-10-CM | POA: Diagnosis not present

## 2011-08-30 HISTORY — PX: CORNEAL TRANSPLANT: SHX108

## 2011-08-31 DIAGNOSIS — T86899 Unspecified complication of other transplanted tissue: Secondary | ICD-10-CM | POA: Diagnosis not present

## 2011-08-31 DIAGNOSIS — T85698A Other mechanical complication of other specified internal prosthetic devices, implants and grafts, initial encounter: Secondary | ICD-10-CM | POA: Diagnosis not present

## 2011-08-31 DIAGNOSIS — K219 Gastro-esophageal reflux disease without esophagitis: Secondary | ICD-10-CM | POA: Diagnosis not present

## 2011-08-31 DIAGNOSIS — Y838 Other surgical procedures as the cause of abnormal reaction of the patient, or of later complication, without mention of misadventure at the time of the procedure: Secondary | ICD-10-CM | POA: Diagnosis not present

## 2011-08-31 DIAGNOSIS — T85398A Other mechanical complication of other ocular prosthetic devices, implants and grafts, initial encounter: Secondary | ICD-10-CM | POA: Diagnosis not present

## 2011-08-31 DIAGNOSIS — I10 Essential (primary) hypertension: Secondary | ICD-10-CM | POA: Diagnosis not present

## 2011-08-31 DIAGNOSIS — Z79899 Other long term (current) drug therapy: Secondary | ICD-10-CM | POA: Diagnosis not present

## 2011-09-14 DIAGNOSIS — I219 Acute myocardial infarction, unspecified: Secondary | ICD-10-CM

## 2011-09-14 HISTORY — DX: Acute myocardial infarction, unspecified: I21.9

## 2011-09-19 DIAGNOSIS — Z947 Corneal transplant status: Secondary | ICD-10-CM | POA: Diagnosis not present

## 2011-09-27 ENCOUNTER — Encounter (HOSPITAL_COMMUNITY): Payer: Self-pay | Admitting: Emergency Medicine

## 2011-09-27 ENCOUNTER — Inpatient Hospital Stay (HOSPITAL_COMMUNITY)
Admission: EM | Admit: 2011-09-27 | Discharge: 2011-10-01 | DRG: 282 | Disposition: A | Discharge status: Home / Self Care | Payer: Medicare Other | Attending: Cardiovascular Disease | Admitting: Cardiovascular Disease

## 2011-09-27 ENCOUNTER — Emergency Department (HOSPITAL_COMMUNITY): Payer: Medicare Other

## 2011-09-27 DIAGNOSIS — R112 Nausea with vomiting, unspecified: Secondary | ICD-10-CM | POA: Diagnosis not present

## 2011-09-27 DIAGNOSIS — R197 Diarrhea, unspecified: Secondary | ICD-10-CM | POA: Diagnosis present

## 2011-09-27 DIAGNOSIS — E785 Hyperlipidemia, unspecified: Secondary | ICD-10-CM

## 2011-09-27 DIAGNOSIS — K219 Gastro-esophageal reflux disease without esophagitis: Secondary | ICD-10-CM | POA: Diagnosis present

## 2011-09-27 DIAGNOSIS — F411 Generalized anxiety disorder: Secondary | ICD-10-CM | POA: Diagnosis present

## 2011-09-27 DIAGNOSIS — I214 Non-ST elevation (NSTEMI) myocardial infarction: Principal | ICD-10-CM

## 2011-09-27 DIAGNOSIS — R0602 Shortness of breath: Secondary | ICD-10-CM | POA: Diagnosis not present

## 2011-09-27 DIAGNOSIS — J9819 Other pulmonary collapse: Secondary | ICD-10-CM | POA: Diagnosis not present

## 2011-09-27 DIAGNOSIS — R61 Generalized hyperhidrosis: Secondary | ICD-10-CM | POA: Diagnosis not present

## 2011-09-27 DIAGNOSIS — Z8249 Family history of ischemic heart disease and other diseases of the circulatory system: Secondary | ICD-10-CM

## 2011-09-27 DIAGNOSIS — M7750 Other enthesopathy of unspecified foot: Secondary | ICD-10-CM | POA: Diagnosis not present

## 2011-09-27 DIAGNOSIS — I10 Essential (primary) hypertension: Secondary | ICD-10-CM | POA: Diagnosis not present

## 2011-09-27 DIAGNOSIS — R079 Chest pain, unspecified: Secondary | ICD-10-CM | POA: Diagnosis present

## 2011-09-27 DIAGNOSIS — I2510 Atherosclerotic heart disease of native coronary artery without angina pectoris: Secondary | ICD-10-CM | POA: Diagnosis present

## 2011-09-27 LAB — CBC
HCT: 39.5 % (ref 36.0–46.0)
Platelets: 231 10*3/uL (ref 150–400)
RDW: 13.2 % (ref 11.5–15.5)
WBC: 7.4 10*3/uL (ref 4.0–10.5)

## 2011-09-27 LAB — POCT I-STAT, CHEM 8
Calcium, Ion: 1.24 mmol/L (ref 1.13–1.30)
Hemoglobin: 13.9 g/dL (ref 12.0–15.0)
Sodium: 141 mEq/L (ref 135–145)
TCO2: 20 mmol/L (ref 0–100)

## 2011-09-27 LAB — PROTIME-INR
INR: 0.99 (ref 0.00–1.49)
Prothrombin Time: 13.3 seconds (ref 11.6–15.2)

## 2011-09-27 LAB — POCT I-STAT TROPONIN I: Troponin i, poc: 0.9 ng/mL (ref 0.00–0.08)

## 2011-09-27 LAB — APTT: aPTT: 27 seconds (ref 24–37)

## 2011-09-27 MED ORDER — HEPARIN (PORCINE) IN NACL 100-0.45 UNIT/ML-% IJ SOLN: 12.0000 [IU]/kg/h | INTRAMUSCULAR | Status: DC

## 2011-09-27 MED ORDER — HEPARIN BOLUS VIA INFUSION
4000.0000 [IU] | Freq: Once | INTRAVENOUS | Status: AC
  Administered 2011-09-27: 4000 [IU] via INTRAVENOUS

## 2011-09-27 MED ORDER — SODIUM CHLORIDE 0.9 % IV SOLN
1000.0000 mL | INTRAVENOUS | Status: DC
  Administered 2011-09-27: 1000 mL via INTRAVENOUS

## 2011-09-27 MED ORDER — HEPARIN (PORCINE) IN NACL 100-0.45 UNIT/ML-% IJ SOLN
1150.0000 [IU]/h | INTRAMUSCULAR | Status: DC
  Administered 2011-09-27: 1150 [IU]/h via INTRAVENOUS
  Filled 2011-09-27 (×3): qty 250

## 2011-09-27 MED ORDER — NITROGLYCERIN IN D5W 200-5 MCG/ML-% IV SOLN
10.0000 ug/min | Freq: Once | INTRAVENOUS | Status: AC
  Administered 2011-09-27: 10 ug/min via INTRAVENOUS
  Filled 2011-09-27: qty 250

## 2011-09-27 MED ORDER — HEPARIN SODIUM (PORCINE) 5000 UNIT/ML IJ SOLN: 60.0000 [IU]/kg | Freq: Once | INTRAMUSCULAR | Status: DC

## 2011-09-27 NOTE — Progress Notes (Signed)
ANTICOAGULATION CONSULT NOTE - Initial Consult  Pharmacy Consult for Heparin Indication: chest pain/ACS and atrial fibrillation  Allergies  Allergen Reactions  . Iohexol     rash  . Verapamil     REACTION: dizziness    Patient Measurements:   Heparin Dosing Weight:  77.9 kg  Vital Signs: Temp: 98.2 F (36.8 C) (08/14 2211) Temp src: Oral (08/14 2211) BP: 141/81 mmHg (08/14 2211)  Labs: No results found for this basename: HGB:2,HCT:3,PLT:3,APTT:3,LABPROT:3,INR:3,HEPARINUNFRC:3,CREATININE:3,CKTOTAL:3,CKMB:3,TROPONINI:3 in the last 72 hours  The CrCl is unknown because both a height and weight (above a minimum accepted value) are required for this calculation.   Medical History: Past Medical History  Diagnosis Date  . URI (upper respiratory infection)   . Diarrhea     chronic  . UTI (urinary tract infection)   . Internal hemorrhoids   . External hemorrhoid   . Coccygeal pain   . Anal or rectal pain   . Left hip pain   . Corneal disorder   . Adverse drug reaction   . Postmenopausal status   . Dysphagia   . Back pain   . Dysmetabolic syndrome X   . Arthritis     osteo...right knee  . Carpal tunnel syndrome   . GERD (gastroesophageal reflux disease)   . Diverticular disease   . Hx of adenomatous colonic polyps   . Hernia     hiatal  . Anxiety state     NOS  . Hypertension     essen. NOS  . Rectal pain   . Shoulder fracture     Medications: pending  Assessment: CP with no know cardiac history. Has received ASA and NTG x3. PMH noted as above. Labs pending. No recent bleeding noted.   Goal of Therapy:  Heparin level 0.3-0.7 units/ml Monitor platelets by anticoagulation protocol: Yes   Plan:  Heparin 4000 units IV bolus Heparin infusion 1150 units/hr. Check heparin level and CBC in 6-8 hrs and daily.  Merilynn Finland, [employer] 09/27/2011,10:25 PM

## 2011-09-27 NOTE — ED Notes (Signed)
EKG to Dr Bernette Mayers at this time.

## 2011-09-27 NOTE — ED Notes (Signed)
Pt states she started having mid chest pain that radiates down her left arm. Pt rates pain at a 5. Pt states " I thought I was gonna pass out. I had nausea". Pt denies nausea at this time. Pt c/o SOB

## 2011-09-27 NOTE — ED Notes (Signed)
Patient with chest pain that started one hour ago.  Patient does not have any cardiac history that she knows of.  Patient was given 324mg  ASA and 3 sl nitro with some relief.  Patient is CAOx3.

## 2011-09-27 NOTE — ED Provider Notes (Signed)
History     CSN: [id number]  Arrival date & time 09/27/11  2208   First MD Initiated Contact with Patient 09/27/11 2211      Chief Complaint  Patient presents with  . Chest Pain    (Consider location/radiation/quality/duration/timing/severity/associated sxs/prior treatment) HPI Pt with multiple medical problems but no known CAD presents via EMS with about an hour of midsternal chest pain, radiating into L arm and associated with nausea, diaphoresis and SOB. Took ASA at home. Given NTG via EMS with some relief.     Past Medical History  Diagnosis Date  . URI (upper respiratory infection)   . Diarrhea     chronic  . UTI (urinary tract infection)   . Internal hemorrhoids   . External hemorrhoid   . Coccygeal pain   . Anal or rectal pain   . Left hip pain   . Corneal disorder   . Adverse drug reaction   . Postmenopausal status   . Dysphagia   . Back pain   . Dysmetabolic syndrome X   . Arthritis     osteo...right knee  . Carpal tunnel syndrome   . GERD (gastroesophageal reflux disease)   . Diverticular disease   . Hx of adenomatous colonic polyps   . Hernia     hiatal  . Anxiety state     NOS  . Hypertension     essen. NOS  . Rectal pain   . Shoulder fracture     Past Surgical History  Procedure Date  . Appendectomy   . Cholecystectomy   . Abdominal hysterectomy   . Breast surgery     lumpectomy; right  . Corneal transplants     bilateral  . Colonoscopy w/ polypectomy   . Lumbar spur removed   . Total knee arthroplasty     right  . Bunionectomy   . Ankle cystectomy     right  . Rectocele surgery   . 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 Hemorrhoid surgery 12/01/2010    THD hemorrhoid ligation/pexy    Family History  Problem Relation Age of Onset  . Breast cancer Mother   . Heart disease Father   . Stroke Father   . Dementia Father   . Diabetes Sister     3 sisters   . Heart disease Sister     1 sister CABG  . Breast cancer Sister    PTE pre & post Dx of ca  . Diabetes Paternal Grandfather   . Colon cancer Neg Hx   . Prostate cancer Father     History  Substance Use Topics  . Smoking status: Never Smoker   . Smokeless tobacco: Never Used  . Alcohol Use: Yes     rare    OB History    Grav Para Term Preterm Abortions TAB SAB Ect Mult Living                  Review of Systems All other systems reviewed and are negative except as noted in HPI.   Allergies  Iohexol; Iodine; Latex; Tape; and Verapamil  Home Medications   Current Outpatient Rx  Name Route Sig Dispense Refill  . VITAMIN D 1000 UNITS PO TABS Oral Take 2,000 Units by mouth daily.    [name] ESOMEPRAZOLE MAGNESIUM 40 MG PO CPDR Oral Take 1 capsule (40 mg total) by mouth daily before breakfast. 90 capsule 3  . ESTRADIOL 1 MG PO TABS Oral Take 1 mg  by mouth daily.      [name] GABAPENTIN 100 MG PO CAPS Oral Take 100 mg by mouth 3 (three) times daily as needed. For pain    . LOPERAMIDE HCL 2 MG PO CAPS Oral Take 2 mg by mouth 4 (four) times daily as needed. For loose stool    . LOSARTAN POTASSIUM 100 MG PO TABS Oral Take 100 mg by mouth daily.    [name] PREDNISOLONE ACETATE 1 % OP SUSP Right Eye Place 2 drops into the right eye 4 (four) times daily.     [name] PRESCRIPTION MEDICATION  Patient unable to recall names. Eye drops. Husband to bring in if patient gets admitted.    . PSYLLIUM 58.6 % PO POWD Oral Take 1 packet by mouth 3 (three) times daily as needed. For regularity    . ZOLPIDEM TARTRATE 10 MG PO TABS Oral Take 10 mg by mouth at bedtime.        BP 141/81  Temp 98.2 F (36.8 C) (Oral)  Resp 20  Ht 5\' 5"  (1.651 m)  Wt 207 lb (93.895 kg)  BMI 34.45 kg/m2  SpO2 96%  Physical Exam  Nursing note and vitals reviewed. Constitutional: She is oriented to person, place, and time. She appears well-developed and well-nourished.  HENT:  Head: Normocephalic and atraumatic.  Eyes: EOM are normal. Pupils are equal, round, and reactive to light.  Neck: Normal range  of motion. Neck supple.  Cardiovascular: Normal rate, normal heart sounds and intact distal pulses.   Pulmonary/Chest: Effort normal and breath sounds normal.  Abdominal: Bowel sounds are normal. She exhibits no distension. There is no tenderness.  Musculoskeletal: Normal range of motion. She exhibits no edema and no tenderness.  Neurological: She is alert and oriented to person, place, and time. She has normal strength. No cranial nerve deficit or sensory deficit.  Skin: Skin is warm and dry. No rash noted.  Psychiatric: She has a normal mood and affect.    ED Course  Procedures (including critical care time)  Labs Reviewed  POCT I-STAT, CHEM 8 - Abnormal; Notable for the following:    Glucose, Bld 145 (*)     All other components within normal limits  POCT I-STAT TROPONIN I - Abnormal; Notable for the following:    Troponin i, poc 0.90 (*)     All other components within normal limits  CBC  PROTIME-INR  APTT  HEPARIN LEVEL (UNFRACTIONATED)  CBC   Dg Chest Portable 1 View  09/27/2011  *RADIOLOGY REPORT*  Clinical Data: Chest pain.  Short of breath.  Hypertension.  PORTABLE CHEST - 1 VIEW  Comparison: 11/29/2010.  Findings: Low volume chest.  Hiatal hernia.  Pulmonary vascular congestion.  Basilar atelectasis.  Interstitial edema. Monitoring leads are projected over the chest.  Mediastinal contours appear within normal limits allowing for low volumes.  IMPRESSION: Low volume chest with mild CHF.  Original Report Authenticated By: Andreas Newport, M.D.     1. Non-STEMI (non-ST elevated myocardial infarction)       MDM   Date: 09/27/2011  Rate: 68  Rhythm: normal sinus rhythm  QRS Axis: normal  Intervals: normal  ST/T Wave abnormalities: ST depressions anteriorly  Conduction Disutrbances:none  Narrative Interpretation:   Old EKG Reviewed: changes noted ST depressions new  Pt with positive Trop. NTG and Heparin drip are going. Discussed with Cardiology who will come  evaluate the patient.   CRITICAL CARE Performed by: Pollyann Savoy   Total critical care time: 40  Critical  care time was exclusive of separately billable procedures and treating other patients.  Critical care was necessary to treat or prevent imminent or life-threatening deterioration.  Critical care was time spent personally by me on the following activities: development of treatment plan with patient and/or surrogate as well as nursing, discussions with consultants, evaluation of patient's response to treatment, examination of patient, obtaining history from patient or surrogate, ordering and performing treatments and interventions, ordering and review of laboratory studies, ordering and review of radiographic studies, pulse oximetry and re-evaluation of patient's condition.         Charles B. Bernette Mayers, MD 09/27/11 2312

## 2011-09-28 ENCOUNTER — Encounter (HOSPITAL_COMMUNITY)
Admission: EM | Disposition: A | Discharge status: Home / Self Care | Payer: Self-pay | Source: Home / Self Care | Attending: Cardiovascular Disease

## 2011-09-28 ENCOUNTER — Encounter (HOSPITAL_COMMUNITY): Payer: Self-pay | Admitting: Neurology

## 2011-09-28 DIAGNOSIS — Z8249 Family history of ischemic heart disease and other diseases of the circulatory system: Secondary | ICD-10-CM | POA: Diagnosis not present

## 2011-09-28 DIAGNOSIS — R197 Diarrhea, unspecified: Secondary | ICD-10-CM | POA: Diagnosis not present

## 2011-09-28 DIAGNOSIS — R0602 Shortness of breath: Secondary | ICD-10-CM | POA: Diagnosis not present

## 2011-09-28 DIAGNOSIS — J9819 Other pulmonary collapse: Secondary | ICD-10-CM | POA: Diagnosis not present

## 2011-09-28 DIAGNOSIS — I2510 Atherosclerotic heart disease of native coronary artery without angina pectoris: Secondary | ICD-10-CM

## 2011-09-28 DIAGNOSIS — I214 Non-ST elevation (NSTEMI) myocardial infarction: Secondary | ICD-10-CM | POA: Diagnosis not present

## 2011-09-28 DIAGNOSIS — R079 Chest pain, unspecified: Secondary | ICD-10-CM | POA: Diagnosis present

## 2011-09-28 DIAGNOSIS — F411 Generalized anxiety disorder: Secondary | ICD-10-CM | POA: Diagnosis present

## 2011-09-28 DIAGNOSIS — I10 Essential (primary) hypertension: Secondary | ICD-10-CM | POA: Diagnosis not present

## 2011-09-28 DIAGNOSIS — E785 Hyperlipidemia, unspecified: Secondary | ICD-10-CM | POA: Diagnosis not present

## 2011-09-28 DIAGNOSIS — K219 Gastro-esophageal reflux disease without esophagitis: Secondary | ICD-10-CM | POA: Diagnosis not present

## 2011-09-28 DIAGNOSIS — I059 Rheumatic mitral valve disease, unspecified: Secondary | ICD-10-CM | POA: Diagnosis not present

## 2011-09-28 HISTORY — PX: CARDIAC CATHETERIZATION: SHX172

## 2011-09-28 HISTORY — PX: LEFT HEART CATHETERIZATION WITH CORONARY ANGIOGRAM: SHX5451

## 2011-09-28 LAB — COMPREHENSIVE METABOLIC PANEL
AST: 36 U/L (ref 0–37)
Albumin: 3.3 g/dL — ABNORMAL LOW (ref 3.5–5.2)
Alkaline Phosphatase: 72 U/L (ref 39–117)
BUN: 13 mg/dL (ref 6–23)
Chloride: 106 mEq/L (ref 96–112)
Potassium: 3.9 mEq/L (ref 3.5–5.1)
Total Bilirubin: 0.3 mg/dL (ref 0.3–1.2)

## 2011-09-28 LAB — CARDIAC PANEL(CRET KIN+CKTOT+MB+TROPI)
Relative Index: 6.4 — ABNORMAL HIGH (ref 0.0–2.5)
Relative Index: 6.2 — ABNORMAL HIGH (ref 0.0–2.5)
Relative Index: 5.8 — ABNORMAL HIGH (ref 0.0–2.5)
Total CK: 282 U/L — ABNORMAL HIGH (ref 7–177)
Total CK: 252 U/L — ABNORMAL HIGH (ref 7–177)

## 2011-09-28 LAB — LIPID PANEL
HDL: 53 mg/dL (ref >39)
LDL Cholesterol: 118 mg/dL — ABNORMAL HIGH (ref 0–99)
Triglycerides: 96 mg/dL (ref <150)
VLDL: 19 mg/dL (ref 0–40)

## 2011-09-28 LAB — CBC
MCH: 30 pg (ref 26.0–34.0)
MCHC: 33.4 g/dL (ref 30.0–36.0)
Platelets: 241 10*3/uL (ref 150–400)
RDW: 13.5 % (ref 11.5–15.5)

## 2011-09-28 LAB — TSH: TSH: 1.118 u[IU]/mL (ref 0.350–4.500)

## 2011-09-28 LAB — POCT ACTIVATED CLOTTING TIME: Activated Clotting Time: 95 seconds

## 2011-09-28 SURGERY — LEFT HEART CATHETERIZATION WITH CORONARY ANGIOGRAM
Anesthesia: LOCAL

## 2011-09-28 MED ORDER — MORPHINE SULFATE 2 MG/ML IJ SOLN
2.0000 mg | INTRAMUSCULAR | Status: DC | PRN
  Administered 2011-09-28 – 2011-09-29 (×2): 2 mg via INTRAVENOUS
  Filled 2011-09-28 (×2): qty 1

## 2011-09-28 MED ORDER — ESTRADIOL 1 MG PO TABS
1.0000 mg | ORAL_TABLET | Freq: Every day | ORAL | Status: DC
  Administered 2011-09-28 – 2011-10-01 (×4): 1 mg via ORAL
  Filled 2011-09-28 (×4): qty 1

## 2011-09-28 MED ORDER — MIDAZOLAM HCL 2 MG/2ML IJ SOLN
INTRAMUSCULAR | Status: AC
  Filled 2011-09-28: qty 2

## 2011-09-28 MED ORDER — PNEUMOCOCCAL VAC POLYVALENT 25 MCG/0.5ML IJ INJ
0.5000 mL | INJECTION | INTRAMUSCULAR | Status: AC
  Administered 2011-09-29: 0.5 mL via INTRAMUSCULAR
  Filled 2011-09-28: qty 0.5

## 2011-09-28 MED ORDER — NITROGLYCERIN IN D5W 200-5 MCG/ML-% IV SOLN: 20.0000 ug/min | INTRAVENOUS | Status: DC

## 2011-09-28 MED ORDER — NITROGLYCERIN 0.2 MG/ML ON CALL CATH LAB
INTRAVENOUS | Status: AC
  Filled 2011-09-28: qty 1

## 2011-09-28 MED ORDER — ACETAMINOPHEN 325 MG PO TABS
650.0000 mg | ORAL_TABLET | ORAL | Status: DC | PRN
  Administered 2011-09-28 – 2011-09-30 (×6): 650 mg via ORAL
  Filled 2011-09-28 (×5): qty 2

## 2011-09-28 MED ORDER — ATORVASTATIN CALCIUM 80 MG PO TABS
80.0000 mg | ORAL_TABLET | Freq: Every day | ORAL | Status: DC
  Administered 2011-09-28 – 2011-09-30 (×3): 80 mg via ORAL
  Filled 2011-09-28 (×4): qty 1

## 2011-09-28 MED ORDER — HEPARIN (PORCINE) IN NACL 2-0.9 UNIT/ML-% IJ SOLN
INTRAMUSCULAR | Status: AC
  Filled 2011-09-28: qty 2000

## 2011-09-28 MED ORDER — SODIUM CHLORIDE 0.9 % IJ SOLN: 3.0000 mL | INTRAMUSCULAR | Status: DC | PRN

## 2011-09-28 MED ORDER — GABAPENTIN 100 MG PO CAPS
100.0000 mg | ORAL_CAPSULE | Freq: Three times a day (TID) | ORAL | Status: DC
  Administered 2011-09-28 – 2011-10-01 (×10): 100 mg via ORAL
  Filled 2011-09-28 (×12): qty 1

## 2011-09-28 MED ORDER — ZOLPIDEM TARTRATE 5 MG PO TABS
5.0000 mg | ORAL_TABLET | Freq: Every evening | ORAL | Status: DC | PRN
  Administered 2011-09-28 – 2011-09-30 (×4): 5 mg via ORAL
  Filled 2011-09-28 (×4): qty 1

## 2011-09-28 MED ORDER — PREDNISOLONE ACETATE 1 % OP SUSP
2.0000 [drp] | Freq: Three times a day (TID) | OPHTHALMIC | Status: DC
  Administered 2011-09-28 – 2011-10-01 (×13): 2 [drp] via OPHTHALMIC
  Filled 2011-09-28: qty 1

## 2011-09-28 MED ORDER — ONDANSETRON HCL 4 MG/2ML IJ SOLN: 4.0000 mg | Freq: Four times a day (QID) | INTRAMUSCULAR | Status: DC | PRN

## 2011-09-28 MED ORDER — DIPHENHYDRAMINE HCL 50 MG/ML IJ SOLN
INTRAMUSCULAR | Status: AC
  Filled 2011-09-28: qty 1

## 2011-09-28 MED ORDER — METOPROLOL TARTRATE 12.5 MG HALF TABLET
12.5000 mg | ORAL_TABLET | Freq: Two times a day (BID) | ORAL | Status: DC
  Administered 2011-09-28 – 2011-09-29 (×5): 12.5 mg via ORAL
  Filled 2011-09-28 (×7): qty 1

## 2011-09-28 MED ORDER — VITAMIN D3 25 MCG (1000 UNIT) PO TABS
2000.0000 [IU] | ORAL_TABLET | Freq: Every day | ORAL | Status: DC
  Administered 2011-09-28 – 2011-10-01 (×4): 2000 [IU] via ORAL
  Filled 2011-09-28 (×4): qty 2

## 2011-09-28 MED ORDER — ASPIRIN 81 MG PO CHEW: 324.0000 mg | CHEWABLE_TABLET | ORAL | Status: DC

## 2011-09-28 MED ORDER — LOPERAMIDE HCL 2 MG PO CAPS: 2.0000 mg | ORAL_CAPSULE | ORAL | Status: DC | PRN

## 2011-09-28 MED ORDER — NITROGLYCERIN 0.4 MG SL SUBL: 0.4000 mg | SUBLINGUAL_TABLET | SUBLINGUAL | Status: DC | PRN

## 2011-09-28 MED ORDER — SODIUM CHLORIDE 0.9 % IJ SOLN: 3.0000 mL | Freq: Two times a day (BID) | INTRAMUSCULAR | Status: DC

## 2011-09-28 MED ORDER — LIDOCAINE HCL (PF) 1 % IJ SOLN
INTRAMUSCULAR | Status: AC
  Filled 2011-09-28: qty 30

## 2011-09-28 MED ORDER — MORPHINE SULFATE 2 MG/ML IJ SOLN: 2.0000 mg | INTRAMUSCULAR | Status: DC | PRN

## 2011-09-28 MED ORDER — SODIUM CHLORIDE 0.9 % IV SOLN
1000.0000 mL | INTRAVENOUS | Status: AC
Start: 2011-09-28 — End: 2011-09-28

## 2011-09-28 MED ORDER — METHYLPREDNISOLONE SODIUM SUCC 125 MG IJ SOLR
INTRAMUSCULAR | Status: AC
  Filled 2011-09-28: qty 2

## 2011-09-28 MED ORDER — FAMOTIDINE IN NACL 20-0.9 MG/50ML-% IV SOLN
INTRAVENOUS | Status: AC
  Filled 2011-09-28: qty 50

## 2011-09-28 MED ORDER — VERAPAMIL HCL 2.5 MG/ML IV SOLN
INTRAVENOUS | Status: AC
  Filled 2011-09-28: qty 2

## 2011-09-28 MED ORDER — SODIUM CHLORIDE 0.9 % IV SOLN: 250.0000 mL | INTRAVENOUS | Status: DC | PRN

## 2011-09-28 MED ORDER — SODIUM CHLORIDE 0.9 % IV SOLN: INTRAVENOUS | Status: AC

## 2011-09-28 MED ORDER — ACETAMINOPHEN 325 MG PO TABS
650.0000 mg | ORAL_TABLET | ORAL | Status: DC | PRN
  Administered 2011-09-28: 650 mg via ORAL
  Filled 2011-09-28 (×2): qty 2

## 2011-09-28 MED ORDER — NITROGLYCERIN IN D5W 200-5 MCG/ML-% IV SOLN: 2.0000 ug/min | INTRAVENOUS | Status: DC

## 2011-09-28 MED ORDER — LOSARTAN POTASSIUM 50 MG PO TABS
100.0000 mg | ORAL_TABLET | Freq: Every day | ORAL | Status: DC
  Administered 2011-09-28 – 2011-09-29 (×2): 100 mg via ORAL
  Filled 2011-09-28 (×3): qty 2

## 2011-09-28 MED ORDER — PANTOPRAZOLE SODIUM 40 MG PO TBEC
40.0000 mg | DELAYED_RELEASE_TABLET | Freq: Every day | ORAL | Status: DC
  Administered 2011-09-28 – 2011-10-01 (×4): 40 mg via ORAL
  Filled 2011-09-28 (×4): qty 1

## 2011-09-28 MED ORDER — ASPIRIN EC 81 MG PO TBEC
81.0000 mg | DELAYED_RELEASE_TABLET | Freq: Every day | ORAL | Status: DC
  Administered 2011-09-30 – 2011-10-01 (×2): 81 mg via ORAL
  Filled 2011-09-28 (×3): qty 1

## 2011-09-28 MED ORDER — BIOTENE DRY MOUTH MT LIQD
15.0000 mL | Freq: Two times a day (BID) | OROMUCOSAL | Status: DC
  Administered 2011-09-28 (×2): 15 mL via OROMUCOSAL

## 2011-09-28 MED ORDER — ASPIRIN 81 MG PO CHEW
CHEWABLE_TABLET | ORAL | Status: AC
  Filled 2011-09-28: qty 4

## 2011-09-28 MED ORDER — HEPARIN (PORCINE) IN NACL 100-0.45 UNIT/ML-% IJ SOLN
1150.0000 [IU]/h | INTRAMUSCULAR | Status: DC
  Administered 2011-09-28: 1150 [IU]/h via INTRAVENOUS
  Filled 2011-09-28 (×2): qty 250

## 2011-09-28 MED ORDER — HEPARIN SODIUM (PORCINE) 1000 UNIT/ML IJ SOLN
INTRAMUSCULAR | Status: AC
  Filled 2011-09-28: qty 1

## 2011-09-28 NOTE — H&P (View-Only) (Signed)
Patient ID: Connie Owens, female   DOB: 06/27/1945, 66 y.o.   MRN: [id number]   The patient had chest pain on August 14 at 9 PM. She had continued symptoms and was admitted to the hospital. She was first seen by our team around 6:00 this morning. There was no diagnostic EKG changes by history. Her first troponin was 0.9. The patient has received appropriate medications. However she has had continued very slight discomfort and now it is increasing. Her second troponin is 6.0. I feel she should go to the cath lab now for cardiac catheterization.  There is question of contrast allergy.. I have carefully discussed this with her. This was from the remote past. She had nausea. She did not have hives or difficulty breathing or decrease in blood pressure. Therefore I feel it is most prudent to proceed with the catheterization. Before the procedure is started she will be receiving Benadryl and IV steroids.  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 This will be kept in mind when decisions are made about what type of meds are used for her long-term, especially dual antiplatelet therapy.  At this time she is being treated as a non-STEMI with ongoing chest pain.  Jeff Rameses Ou, MD 

## 2011-09-28 NOTE — Progress Notes (Signed)
CRITICAL VALUE ALERT  Critical value received:  CKMB 18, Troponin 6.35  Date of notification:  09/28/2011  Time of notification:  3:35 AM  Critical value read back:yes  Nurse who received alert:  Gregor Hams  MD notified (1st page):  Nada Boozer, NP  Time of first page:  [phone]  MD notified (2nd page):  Time of second page:  Responding MD:  Nada Boozer, NP  Time MD responded:  [phone]

## 2011-09-28 NOTE — Progress Notes (Signed)
Back from the cath lab . Lethargic but arousable.

## 2011-09-28 NOTE — Progress Notes (Addendum)
Patient ID: Connie Owens, female   DOB: 1945/11/11, 66 y.o.   MRN: [id number]   The patient had chest pain on August 14 at 9 PM. She had continued symptoms and was admitted to the hospital. She was first seen by our team around 6:00 this morning. There was no diagnostic EKG changes by history. Her first troponin was 0.9. The patient has received appropriate medications. However she has had continued very slight discomfort and now it is increasing. Her second troponin is 6.0. I feel she should go to the cath lab now for cardiac catheterization.  There is question of contrast allergy.. I have carefully discussed this with her. This was from the remote past. She had nausea. She did not have hives or difficulty breathing or decrease in blood pressure. Therefore I feel it is most prudent to proceed with the catheterization. Before the procedure is started she will be receiving Benadryl and IV steroids.  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 This will be kept in mind when decisions are made about what type of meds are used for her long-term, especially dual antiplatelet therapy.  At this time she is being treated as a non-STEMI with ongoing chest pain.  Jerral Bonito, MD

## 2011-09-28 NOTE — Interval H&P Note (Signed)
History and Physical Interval Note:  09/28/2011 10:56 AM  Connie Owens  has presented today for surgery, with the diagnosis of cp  The various methods of treatment have been discussed with the patient and family. After consideration of risks, benefits and other options for treatment, the patient has consented to  Procedure(s) (LRB): LEFT HEART CATHETERIZATION WITH CORONARY ANGIOGRAM (N/A) as a surgical intervention .  The patient's history has been reviewed, patient examined, no change in status, stable for surgery.  I have reviewed the patient's chart and labs.  Questions were answered to the patient's satisfaction.     Rollene Rotunda

## 2011-09-28 NOTE — CV Procedure (Signed)
  Cardiac Catheterization Procedure Note  Name: Connie Owens MRN: [id number] DOB: 25-Aug-1945  Procedure: Left Heart Cath, Selective Coronary Angiography, LV angiography  Indication: NQWMI  Procedural details: The right groin was prepped, draped, and anesthetized with 1% lidocaine. Using modified Seldinger technique, a 5 French sheath was introduced into the right femoral artery. Standard Judkins catheters were used for coronary angiography and left ventriculography. Catheter exchanges were performed over a guidewire. There were no immediate procedural complications. The patient was transferred to the post catheterization recovery area for further monitoring.  I did attempt a radial approach.  I was easily able to obtain access into the radial artery and a % French sheath was placed.  However, I was unable to maneuver the wire around the aortic arch into the ascending aorta.  I then switched to a femoral approach.  Procedural Findings:   Hemodynamics:     AO 116/74    LV 116/10   Coronary angiography:   Coronary dominance: Right  Left mainstem:   Ostial 50%, distal calcified 30%  Left anterior descending (LAD):   Heavy proximal calcification with long 50% stenosis and ostial 60 - 70% stenosis.  Mid diffuse disease with long mid 70%.  Distal and apical diffuse plaque nonobstructive but the vessel is very small in this distribution.   D1 moderate sized with proximal 30%.  D2 small and long with proximal 40%  Left circumflex (LCx):  AV mild proximal plaque.   Ramus intermedius small and normal.  OM1 very large and normal.  OM2 and OM3 very small and normal.  Small PL x 2  Right coronary artery (RCA):  Large vessel.  Ostial 25% with spasm.  Mid long 40%.  Acute marginal large and normal.  PDA large and normal.  Left ventriculography: Left ventricular systolic function with anterior mid hypokinesis.   LVEF is estimated at 50%, there is no significant mitral regurgitation   The LV was  incompletely filled.  Final Conclusions:  NQWMI.  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  Wall motion abnormality would suggest that that LAD is the culprit.  Recommendations:   The LAD is not amenable to PCI given the ostial disease and the diffuse nature of the disease.  CABG could be considered for continued pain although the LAD is not a large vessel.  I would suggest medical management and risk reduction with revascularization if she has continued pain.  Check an echocardiogram.    Rollene Rotunda 09/28/2011, 11:57 AM

## 2011-09-28 NOTE — Progress Notes (Signed)
To the cath lab by bed. Report given to staff.

## 2011-09-28 NOTE — Progress Notes (Signed)
ANTICOAGULATION CONSULT NOTE  Pharmacy Consult for Heparin Indication: CP/ACS  Allergies  Allergen Reactions  . Iohexol     rash  . Iodine   . Latex   . Tape   . Verapamil     REACTION: dizziness    Patient Measurements: Height: 5\' 5"  (165.1 cm) Weight: 213 lb 3 oz (96.7 kg) IBW/kg (Calculated) : 57  Heparin Dosing Weight:  77.9 kg  Vital Signs: Temp: 98.3 F (36.8 C) (08/15 0700) Temp src: Oral (08/15 0700) BP: 100/50 mmHg (08/15 1000) Pulse Rate: 63  (08/15 1036)  Labs:  Basename 09/28/11 0910 09/28/11 0600 09/28/11 0218 09/27/11 2245 09/27/11 2221  HGB -- 12.4 -- 13.9 --  HCT -- 37.1 -- 41.0 39.5  PLT -- 241 -- -- 231  APTT -- -- -- -- 27  LABPROT -- -- -- -- 13.3  INR -- -- -- -- 0.99  HEPARINUNFRC -- 0.46 -- -- --  CREATININE -- -- 0.62 0.70 --  CKTOTAL 252* -- 282* -- --  CKMB 15.7* -- 18.0* -- --  TROPONINI 4.38* -- 6.35* -- --    Estimated Creatinine Clearance: 79.6 ml/min (by C-G formula based on Cr of 0.62).  Assessment:  66 yo female with chest pain now s/p cath to restart heparin 8 hrs post sheath removal (done ~ 12:30pm). Last heparin rate was 1150 units/hr and heparin level was at goal.  Goal of Therapy:  Heparin level 0.3-0.7 units/ml Monitor platelets by anticoagulation protocol: Yes   Plan:  -Resume heparin 8hrs post sheath removal (~ 8:30pm) and 1150 units/hr -Heparin level and CBC in am and daily  Harland German, Pharm D 09/28/2011 1:06 PM

## 2011-09-28 NOTE — Care Management Note (Signed)
    Page 1 of 1   09/28/2011     10:17:46 AM   CARE MANAGEMENT NOTE 09/28/2011  Patient:  Connie Owens, Connie Owens   Account Number:  [id number]  Date Initiated:  09/28/2011  Documentation initiated by:  Junius Creamer  Subjective/Objective Assessment:   adm w mi     Action/Plan:   lives w husband, pcp dr Chrissie Noa hopper   Anticipated DC Date:     Anticipated DC Plan:        DC Planning Services  CM consult      Choice offered to / List presented to:             Status of service:   Medicare Important Message given?   (If response is "NO", the following Medicare IM given date fields will be blank) Date Medicare IM given:   Date Additional Medicare IM given:    Discharge Disposition:    Per UR Regulation:  Reviewed for med. necessity/level of care/duration of stay  If discussed at Long Length of Stay Meetings, dates discussed:    Comments:  8/15 10:14a debbie Rona Tomson rn,bsn [phone]

## 2011-09-28 NOTE — Progress Notes (Signed)
ANTICOAGULATION CONSULT NOTE  Pharmacy Consult for Heparin Indication: chest pain/ACS and atrial fibrillation  Allergies  Allergen Reactions  . Iohexol     rash  . Iodine   . Latex   . Tape   . Verapamil     REACTION: dizziness    Patient Measurements: Height: 5\' 5"  (165.1 cm) Weight: 213 lb 3 oz (96.7 kg) IBW/kg (Calculated) : 57  Heparin Dosing Weight:  77.9 kg  Vital Signs: Temp: 98.8 F (37.1 C) (08/15 0354) Temp src: Oral (08/15 0354) BP: 111/78 mmHg (08/15 0400) Pulse Rate: 68  (08/15 0400)  Labs:  Basename 09/28/11 0600 09/28/11 0218 09/27/11 2245 09/27/11 2221  HGB 12.4 -- 13.9 --  HCT 37.1 -- 41.0 39.5  PLT 241 -- -- 231  APTT -- -- -- 27  LABPROT -- -- -- 13.3  INR -- -- -- 0.99  HEPARINUNFRC 0.46 -- -- --  CREATININE -- 0.62 0.70 --  CKTOTAL -- 282* -- --  CKMB -- 18.0* -- --  TROPONINI -- 6.35* -- --    Estimated Creatinine Clearance: 79.6 ml/min (by C-G formula based on Cr of 0.62).  Assessment:  66 yo female with chest pain for Heparin  Goal of Therapy:  Heparin level 0.3-0.7 units/ml Monitor platelets by anticoagulation protocol: Yes   Plan:  Continue Heparin at current rate F/U after cath  Eddie Candle 09/28/2011,7:13 AM

## 2011-09-28 NOTE — H&P (Addendum)
Connie Owens is an 66 y.o. female.   Chief Complaint: chest pain HPI: Connie Owens is a very pleasant 66 yo woman with PMH of HTN, HLD and recent corneal transplant who had sudden onset of chest pain, 10/10 left-sided, sharp/stabbing quality with some radiation to left arm/shoulder at 20:xx this evening. She called EMS after taking 2 baby aspirin and she took 2 more baby aspirin and had some SL NTG that improved her chest pain. In the ER she had positive troponin, nitroglycerin gtt initiated along with heparin bolus + gtt. She has no associated SOB, but did have some associated nausea/vomiting. She has chronic diarrhea. She has stable 1/2 mile DOE, 1-2 flights DOE, no overt PND or orthopnea. She had recent corneal transplant surgery and has not had any bleeding issues - she was not given any medications to defer/stop/hold after her eye surgery 08/31/11.   Past Medical History  Diagnosis Date  . URI (upper respiratory infection)   . Diarrhea     chronic  . UTI (urinary tract infection)   . Internal hemorrhoids   . External hemorrhoid   . Coccygeal pain   . Anal or rectal pain   . Left hip pain   . Corneal disorder   . Adverse drug reaction   . Postmenopausal status   . Dysphagia   . Back pain   . Dysmetabolic syndrome X   . Arthritis     osteo...right knee  . Carpal tunnel syndrome   . GERD (gastroesophageal reflux disease)   . Diverticular disease   . Hx of adenomatous colonic polyps   . Hernia     hiatal  . Anxiety state     NOS  . Hypertension     essen. NOS  . Rectal pain   . Shoulder fracture     Past Surgical History  Procedure Date  . Appendectomy   . Cholecystectomy   . Abdominal hysterectomy   . Breast surgery     lumpectomy; right  . Corneal transplants     bilateral  . Colonoscopy w/ polypectomy   . Lumbar spur removed   . Total knee arthroplasty     right  . Bunionectomy   . Ankle cystectomy     right  . Rectocele surgery   . Corneal transplant may  2012    right  . Hemorrhoid surgery 12/01/2010    THD hemorrhoid ligation/pexy    Family History  Problem Relation Age of Onset  . Breast cancer Mother   . Heart disease Father   . Stroke Father   . Dementia Father   . Diabetes Sister     3 sisters   . Heart disease Sister     1 sister CABG  . Breast cancer Sister     PTE pre & post Dx of ca  . Diabetes Paternal Grandfather   . Colon cancer Neg Hx   . Prostate cancer Father    Social History:  reports that she has never smoked. She has never used smokeless tobacco. She reports that she drinks alcohol. She reports that she does not use illicit drugs.  Allergies:  Allergies  Allergen Reactions  . Iohexol     rash  . Iodine   . Latex   . Tape   . Verapamil     REACTION: dizziness     (Not in a hospital admission)  Results for orders placed during the hospital encounter of 09/27/11 (from the past 48 hour(s))  CBC  Status: Normal   Collection Time   09/27/11 10:21 PM      Component Value Range Comment   WBC 7.4  4.0 - 10.5 K/uL    RBC 4.46  3.87 - 5.11 MIL/uL    Hemoglobin 13.4  12.0 - 15.0 g/dL    HCT 16.1  09.6 - 04.5 %    MCV 88.6  78.0 - 100.0 fL    MCH 30.0  26.0 - 34.0 pg    MCHC 33.9  30.0 - 36.0 g/dL    RDW 40.9  81.1 - 91.4 %    Platelets 231  150 - 400 K/uL   PROTIME-INR     Status: Normal   Collection Time   09/27/11 10:21 PM      Component Value Range Comment   Prothrombin Time 13.3  11.6 - 15.2 seconds    INR 0.99  0.00 - 1.49   APTT     Status: Normal   Collection Time   09/27/11 10:21 PM      Component Value Range Comment   aPTT 27  24 - 37 seconds   POCT I-STAT TROPONIN I     Status: Abnormal   Collection Time   09/27/11 10:43 PM      Component Value Range Comment   Troponin i, poc 0.90 (*) 0.00 - 0.08 ng/mL    Comment NOTIFIED PHYSICIAN      Comment 3            POCT I-STAT, CHEM 8     Status: Abnormal   Collection Time   09/27/11 10:45 PM      Component Value Range Comment   Sodium  141  135 - 145 mEq/L    Potassium 4.1  3.5 - 5.1 mEq/L    Chloride 107  96 - 112 mEq/L    BUN 13  6 - 23 mg/dL    Creatinine, Ser 7.82  0.50 - 1.10 mg/dL    Glucose, Bld 956 (*) 70 - 99 mg/dL    Calcium, Ion 2.13  0.86 - 1.30 mmol/L    TCO2 20  0 - 100 mmol/L    Hemoglobin 13.9  12.0 - 15.0 g/dL    HCT 57.8  46.9 - 62.9 %    Dg Chest Portable 1 View  09/27/2011  *RADIOLOGY REPORT*  Clinical Data: Chest pain.  Short of breath.  Hypertension.  PORTABLE CHEST - 1 VIEW  Comparison: 11/29/2010.  Findings: Low volume chest.  Hiatal hernia.  Pulmonary vascular congestion.  Basilar atelectasis.  Interstitial edema. Monitoring leads are projected over the chest.  Mediastinal contours appear within normal limits allowing for low volumes.  IMPRESSION: Low volume chest with mild CHF.  Original Report Authenticated By: Connie Owens, M.D.    Review of Systems  Constitutional: Negative for fever, chills and weight loss.  HENT: Negative for hearing loss, neck pain and tinnitus.   Eyes: Negative for blurred vision, double vision and pain.  Respiratory: Negative for cough, hemoptysis, sputum production, shortness of breath and wheezing.   Cardiovascular: Positive for chest pain and leg swelling. Negative for palpitations, orthopnea, claudication and PND.  Gastrointestinal: Positive for diarrhea. Negative for heartburn, nausea, vomiting, abdominal pain and constipation.  Genitourinary: Negative for dysuria, urgency and frequency.  Musculoskeletal: Negative for myalgias and back pain.  Skin: Negative for itching and rash.  Neurological: Negative for dizziness, tingling and headaches.  Endo/Heme/Allergies: Negative for environmental allergies. Does not bruise/bleed easily.  Psychiatric/Behavioral: Negative for depression, suicidal ideas  and substance abuse.    Blood pressure 141/81, temperature 98.2 F (36.8 C), temperature source Oral, resp. rate 20, height 5\' 5"  (1.651 m), weight 93.895 kg (207 lb),  SpO2 96.00%. Physical Exam  Nursing note and vitals reviewed. Constitutional: She is oriented to person, place, and time. She appears well-developed and well-nourished. No distress.       Pleasant woman, mildly anxious  HENT:  Head: Normocephalic and atraumatic.  Nose: Nose normal.  Mouth/Throat: Oropharynx is clear and moist. No oropharyngeal exudate.  Eyes: Conjunctivae and EOM are normal. Pupils are equal, round, and reactive to light. Right eye exhibits no discharge. Left eye exhibits no discharge. No scleral icterus.  Neck: Normal range of motion. Neck supple. JVD present. No tracheal deviation present. No thyromegaly present.       2 cm above clavicle at 45 degrees  Cardiovascular: Normal rate, regular rhythm, normal heart sounds and intact distal pulses.  Exam reveals no gallop.   No murmur heard. Respiratory: Effort normal and breath sounds normal. No respiratory distress. She has no wheezes. She has no rales. She exhibits no tenderness.       Slightly decreased BS at bases  GI: Soft. Bowel sounds are normal. She exhibits no distension. There is no tenderness. There is no rebound.  Musculoskeletal: Normal range of motion. She exhibits edema. She exhibits no tenderness.  Lymphadenopathy:    She has no cervical adenopathy.  Neurological: She is alert and oriented to person, place, and time. No cranial nerve deficit. Coordination normal.  Skin: Skin is dry. No rash noted. She is not diaphoretic. No erythema.  Psychiatric: She has a normal mood and affect. Her behavior is normal.    Labs reviewed; K 4.1, Cr 0.7, plt 231, h/h 13.4/39.5, inr 1.0, ptt 27 Chest x-ray reviewed: mild pulmonary edema EKG reviewed: initial - subtle ST depressions Anterolateral; repeat EKG with some V3-V6 ST flattening EMS EKG reviewed with borderline 1 mm ST elevation I, aVL  Problem List Chest Pain/NSTEMI Hypertension Dyslipidemia Recent Corneal Transplant + family history of CAD/CABG Elevated  CBG  Assessment/Plan 66 yo woman with history of hypertension and dyslipidemia, recent third corneal transplant at [facility] 08/31/11 who presents after having chest pain at 20:00 tonight and troponin positive. Given recent corneal transplant and moderately high pretest probability for multivessel disease, will defer upstream P2Y12 loading. Will continue heparin gtt and nitro gtt. ASA 324 mg given between home 2 doses and ambulance.  - will plan for left heart catheterization in the morning as discussed with patient - continue heparin, aspirin, nitro gtt - defer upstream P2Y12 at this time given high probability for multivessel disease - repeat EKG for any changes in symptoms - trend troponins/CKMB - hba1c, tsh, lipid panel - phase I cardiac rehab - continue home dose ARB - losartan 100 mg - start metoprolol 12.5 mg bid, hold for HR < 55 - atorvastatin 80 mg starting now - defer Echo given possibility for ventriculogram with LHC - Given remote contrast allergy - nausea/vomiting, will defer to primary team timing of IV solumedrol  - Possible discussion with Opthalmology for BMS vs. DES given continued corneal issues treated with corneal transplant  Addendum - She's been chest pain free with a little headache and some 1/10 pressure in her left arm currently - will give low dose morphine Sedra Morfin 09/28/2011, 12:06 AM

## 2011-09-29 DIAGNOSIS — I059 Rheumatic mitral valve disease, unspecified: Secondary | ICD-10-CM

## 2011-09-29 DIAGNOSIS — I214 Non-ST elevation (NSTEMI) myocardial infarction: Secondary | ICD-10-CM

## 2011-09-29 LAB — BASIC METABOLIC PANEL
Chloride: 108 mEq/L (ref 96–112)
Creatinine, Ser: 0.61 mg/dL (ref 0.50–1.10)
GFR calc Af Amer: >90 mL/min (ref >90)
GFR calc non Af Amer: >90 mL/min (ref >90)

## 2011-09-29 LAB — HEPARIN LEVEL (UNFRACTIONATED): Heparin Unfractionated: 0.24 IU/mL — ABNORMAL LOW (ref 0.30–0.70)

## 2011-09-29 LAB — CBC
MCHC: 34.1 g/dL (ref 30.0–36.0)
MCV: 89.8 fL (ref 78.0–100.0)
Platelets: 237 10*3/uL (ref 150–400)
RDW: 13.6 % (ref 11.5–15.5)
WBC: 10.6 10*3/uL — ABNORMAL HIGH (ref 4.0–10.5)

## 2011-09-29 MED ORDER — CLOPIDOGREL BISULFATE 75 MG PO TABS
75.0000 mg | ORAL_TABLET | Freq: Every day | ORAL | Status: DC
  Administered 2011-09-30 – 2011-10-01 (×2): 75 mg via ORAL
  Filled 2011-09-29 (×3): qty 1

## 2011-09-29 MED ORDER — ISOSORBIDE MONONITRATE ER 30 MG PO TB24
30.0000 mg | ORAL_TABLET | Freq: Every day | ORAL | Status: DC
  Administered 2011-09-29: 30 mg via ORAL
  Filled 2011-09-29 (×2): qty 1

## 2011-09-29 MED ORDER — CLOPIDOGREL BISULFATE 300 MG PO TABS
600.0000 mg | ORAL_TABLET | Freq: Once | ORAL | Status: AC
  Administered 2011-09-29: 600 mg via ORAL
  Filled 2011-09-29: qty 2

## 2011-09-29 MED ORDER — SODIUM CHLORIDE 0.9 % IV SOLN
INTRAVENOUS | Status: DC
  Administered 2011-09-29: 04:00:00 via INTRAVENOUS

## 2011-09-29 MED ORDER — HEPARIN (PORCINE) IN NACL 100-0.45 UNIT/ML-% IJ SOLN
1300.0000 [IU]/h | INTRAMUSCULAR | Status: DC
  Administered 2011-09-29: 1300 [IU]/h via INTRAVENOUS

## 2011-09-29 NOTE — Progress Notes (Signed)
CARDIAC REHAB PHASE I   PRE:  Rate/Rhythm: 59 SB    BP: sitting 109/62    SaO2:   MODE:  Ambulation: 390 ft   POST:  Rate/Rhythm: 79 SR    BP: sitting 121/81     SaO2:   Pt had what she called a spasm of CP left chest 1/2 way around unit walking. Very short lived. Also sts she has a tightness or pulling in her left chest with deep breathing. Denies sx if breathing normal. VSS. Ed completed. Interested in CRPII and will send referral to [place]. Encouraged more walking. [id number]  Connie Owens CES, ACSM

## 2011-09-29 NOTE — Progress Notes (Signed)
SUBJECTIVE: No chest pain or SOB this am. Eating breakfast. One episode of chest pain last night.   BP 114/60  Pulse 62  Temp 97.9 F (36.6 C) (Oral)  Resp 20  Ht 5\' 5"  (1.651 m)  Wt 210 lb (95.255 kg)  BMI 34.95 kg/m2  SpO2 96%  Intake/Output Summary (Last 24 hours) at 09/29/11 0745 Last data filed at 09/29/11 0600  Gross per 24 hour  Intake 1801.33 ml  Output   1300 ml  Net 501.33 ml    PHYSICAL EXAM General: Well developed, well nourished, in no acute distress. Alert and oriented x 3.  Psych:  Good affect, responds appropriately Neck: No JVD. No masses noted.  Lungs: Clear bilaterally with no wheezes or rhonci noted.  Heart: RRR with no murmurs noted. Abdomen: Bowel sounds are present. Soft, non-tender.  Extremities: No lower extremity edema. Right groin cath site ok.   LABS: Basic Metabolic Panel:  Basename 09/29/11 0531 09/28/11 0218  NA 140 139  K 3.7 3.9  CL 108 106  CO2 20 22  GLUCOSE 112* 128*  BUN 9 13  CREATININE 0.61 0.62  CALCIUM 9.3 9.3  MG -- --  PHOS -- --   CBC:  Basename 09/29/11 0531 09/28/11 0600  WBC 10.6* 9.4  NEUTROABS -- --  HGB 12.0 12.4  HCT 35.2* 37.1  MCV 89.8 89.6  PLT 237 241   Cardiac Enzymes:  Basename 09/28/11 1610 09/28/11 0910 09/28/11 0218  CKTOTAL 244* 252* 282*  CKMB 14.2* 15.7* 18.0*  CKMBINDEX -- -- --  TROPONINI 2.39* 4.38* 6.35*   Fasting Lipid Panel:  Basename 09/28/11 0600  CHOL 190  HDL 53  LDLCALC 118*  TRIG 96  CHOLHDL 3.6  LDLDIRECT --    Current Meds:    . aspirin      . aspirin EC  81 mg Oral Daily  . atorvastatin  80 mg Oral q1800  . cholecalciferol  2,000 Units Oral Daily  . diphenhydrAMINE      . estradiol  1 mg Oral Daily  . famotidine      . gabapentin  100 mg Oral TID  . heparin      . heparin      . lidocaine      . losartan  100 mg Oral Daily  . methylPREDNISolone sodium succinate      . metoprolol tartrate  12.5 mg Oral BID  . midazolam      . midazolam      .  nitroGLYCERIN      . pantoprazole  40 mg Oral Daily  . pneumococcal 23 valent vaccine  0.5 mL Intramuscular Tomorrow-1000  . prednisoLONE acetate  2 drop Right Eye TID AC & HS  . verapamil      . DISCONTD: antiseptic oral rinse  15 mL Mouth Rinse BID  . DISCONTD: aspirin  324 mg Oral Pre-Cath  . DISCONTD: sodium chloride  3 mL Intravenous Q12H    Principal Problem:  *NSTEMI (non-ST elevated myocardial infarction) Active Problems:  HYPERTENSION, ESSENTIAL NOS  GERD  Hyperlipidemia  Non-STEMI (non-ST elevated myocardial infarction)   ASSESSMENT AND PLAN:  1. CAD/NSTEMI: Pt admitted with chest pain/N/V. She was evaluated by Dr. Myrtis Ser and cardiac cath arranged yesterday. Cardiac cath per Dr. Antoine Poche yesterday with diffusely diseased LAD from the ostium to the distal vessel with calcification and relatively small size. Medical management was recommended. The Circumflex and RCA have non-obstructive disease. She is being managed with ASA, statin,  beta blocker, ARB, Calcium channel blocker. Echo pending today. She is still on heparin and NTG drips. One episode of chest pain last night. Troponin is trending down.   - D/C heparin and NTG drips -Start Imdur 30 mg po QHS -Will load Plavix 600 mg po x 1 this am and start Plavix 75 mg po Qdaily with ACS -Continue ASA/statin/beta blocker/ARB/CaChannel blocker -Ambulate. If no recurrent chest pain this am, can transfer to telemetry this afternoon   MCALHANY,CHRISTOPHER  8/16/20137:45 AM

## 2011-09-29 NOTE — Progress Notes (Signed)
PA(Dana Dunn) notified to possibly transfer Connie Owens to tele but patient still c/o of slight pain 2/10 in her chest that comes and goes she said it feels like a muscle spasm. PA felt it was best to leave her in stepdown over night until she is 100% pain free. Going to try and give patient some Tylenol in hopes of relieving chest pain/muscle spasm.  Will continue to monitor.   Volney Reierson, Charlaine Dalton RN

## 2011-09-29 NOTE — Progress Notes (Signed)
Echocardiogram 2D Echocardiogram with Definity has been performed.  Connie Owens 09/29/2011, 11:41 AM

## 2011-09-29 NOTE — Progress Notes (Signed)
ANTICOAGULATION CONSULT NOTE - Follow-up Pharmacy Consult for Heparin Indication: CP/ACS  Allergies  Allergen Reactions  . Iohexol     rash  . Iodine   . Latex   . Tape   . Verapamil     REACTION: dizziness    Patient Measurements: Height: 5\' 5"  (165.1 cm) Weight: 210 lb (95.255 kg) IBW/kg (Calculated) : 57  Heparin Dosing Weight:  77.9 kg  Vital Signs: Temp: 97.9 F (36.6 C) (08/16 0400) Temp src: Oral (08/16 0400) BP: 114/60 mmHg (08/16 0400) Pulse Rate: 62  (08/16 0400)  Labs:  Basename 09/29/11 0531 09/28/11 1610 09/28/11 0910 09/28/11 0600 09/28/11 0218 09/27/11 2245 09/27/11 2221  HGB 12.0 -- -- 12.4 -- -- --  HCT 35.2* -- -- 37.1 -- 41.0 --  PLT 237 -- -- 241 -- -- 231  APTT -- -- -- -- -- -- 27  LABPROT -- -- -- -- -- -- 13.3  INR -- -- -- -- -- -- 0.99  HEPARINUNFRC 0.24* -- -- 0.46 -- -- --  CREATININE -- -- -- -- 0.62 0.70 --  CKTOTAL -- 244* 252* -- 282* -- --  CKMB -- 14.2* 15.7* -- 18.0* -- --  TROPONINI -- 2.39* 4.38* -- 6.35* -- --    Estimated Creatinine Clearance: 79 ml/min (by C-G formula based on Cr of 0.62).  Assessment:  66 yo female with chest pain now s/p cath restarted on heparin 8 hrs post sheath removal. Heparin level is subtherapeutic at 0.24. No bleeding noted, CBC is stable.   Goal of Therapy:  Heparin level 0.3-0.7 units/ml Monitor platelets by anticoagulation protocol: Yes   Plan:  1. Increase heparin to 1300 units/hr (~2units/kg/hr) 2. Check an 8 hour heparin level  Lysle Pearl, PharmD, BCPS Pager # [phone] 09/29/2011 6:55 AM

## 2011-09-30 DIAGNOSIS — E785 Hyperlipidemia, unspecified: Secondary | ICD-10-CM

## 2011-09-30 LAB — CBC
HCT: 36.5 % (ref 36.0–46.0)
Hemoglobin: 12.1 g/dL (ref 12.0–15.0)
MCH: 30.2 pg (ref 26.0–34.0)
MCHC: 33.2 g/dL (ref 30.0–36.0)
MCV: 91 fL (ref 78.0–100.0)
RDW: 13.7 % (ref 11.5–15.5)

## 2011-09-30 LAB — BASIC METABOLIC PANEL
BUN: 14 mg/dL (ref 6–23)
CO2: 25 mEq/L (ref 19–32)
Chloride: 106 mEq/L (ref 96–112)
Creatinine, Ser: 0.76 mg/dL (ref 0.50–1.10)
GFR calc Af Amer: >90 mL/min (ref >90)
Glucose, Bld: 95 mg/dL (ref 70–99)
Potassium: 3.8 mEq/L (ref 3.5–5.1)

## 2011-09-30 MED ORDER — LOSARTAN POTASSIUM 50 MG PO TABS: 50.0000 mg | ORAL_TABLET | Freq: Every day | ORAL | Status: DC

## 2011-09-30 MED ORDER — ISOSORBIDE MONONITRATE ER 30 MG PO TB24
30.0000 mg | ORAL_TABLET | Freq: Two times a day (BID) | ORAL | Status: DC
  Administered 2011-09-30 – 2011-10-01 (×2): 30 mg via ORAL
  Filled 2011-09-30 (×3): qty 1

## 2011-09-30 MED ORDER — LOSARTAN POTASSIUM 50 MG PO TABS
50.0000 mg | ORAL_TABLET | Freq: Every day | ORAL | Status: DC
  Administered 2011-09-30 – 2011-10-01 (×2): 50 mg via ORAL
  Filled 2011-09-30 (×2): qty 1

## 2011-09-30 MED ORDER — METOPROLOL TARTRATE 25 MG PO TABS
25.0000 mg | ORAL_TABLET | Freq: Two times a day (BID) | ORAL | Status: DC
  Administered 2011-09-30 – 2011-10-01 (×3): 25 mg via ORAL
  Filled 2011-09-30 (×3): qty 1

## 2011-09-30 NOTE — Progress Notes (Signed)
    SUBJECTIVE:   Occasional CP yesterday so left in SDU. Feels good this am. No further CP.   BP 104/53  Pulse 74  Temp 98.7 F (37.1 C) (Oral)  Resp 18  Ht 5\' 5"  (1.651 m)  Wt 95.255 kg (210 lb)  BMI 34.95 kg/m2  SpO2 98%  Intake/Output Summary (Last 24 hours) at 09/30/11 0849 Last data filed at 09/29/11 2100  Gross per 24 hour  Intake    240 ml  Output      0 ml  Net    240 ml    PHYSICAL EXAM General: Well developed, well nourished, in no acute distress. Alert and oriented x 3.  Psych:  Good affect, responds appropriately Neck: No JVD. No masses noted.  Lungs: Clear bilaterally with no wheezes or rhonci noted.  Heart: RRR with no murmurs noted. Abdomen: Bowel sounds are present. Soft, non-tender.  Extremities: No lower extremity edema. Right groin cath site ok.   LABS: Basic Metabolic Panel:  Basename 09/30/11 0610 09/29/11 0531  NA 140 140  K 3.8 3.7  CL 106 108  CO2 25 20  GLUCOSE 95 112*  BUN 14 9  CREATININE 0.76 0.61  CALCIUM 9.1 9.3  MG -- --  PHOS -- --   CBC:  Basename 09/30/11 0610 09/29/11 0531  WBC 7.9 10.6*  NEUTROABS -- --  HGB 12.1 12.0  HCT 36.5 35.2*  MCV 91.0 89.8  PLT 218 237   Cardiac Enzymes:  Basename 09/28/11 1610 09/28/11 0910 09/28/11 0218  CKTOTAL 244* 252* 282*  CKMB 14.2* 15.7* 18.0*  CKMBINDEX -- -- --  TROPONINI 2.39* 4.38* 6.35*   Fasting Lipid Panel:  Basename 09/28/11 0600  CHOL 190  HDL 53  LDLCALC 118*  TRIG 96  CHOLHDL 3.6  LDLDIRECT --    Current Meds:    . aspirin EC  81 mg Oral Daily  . atorvastatin  80 mg Oral q1800  . cholecalciferol  2,000 Units Oral Daily  . clopidogrel  600 mg Oral Once  . clopidogrel  75 mg Oral Q breakfast  . estradiol  1 mg Oral Daily  . gabapentin  100 mg Oral TID  . isosorbide mononitrate  30 mg Oral Daily  . losartan  100 mg Oral Daily  . metoprolol tartrate  12.5 mg Oral BID  . pantoprazole  40 mg Oral Daily  . pneumococcal 23 valent vaccine  0.5 mL  Intramuscular Tomorrow-1000  . prednisoLONE acetate  2 drop Right Eye TID AC & HS    Principal Problem:  *NSTEMI (non-ST elevated myocardial infarction) Active Problems:  HYPERTENSION, ESSENTIAL NOS  GERD  Hyperlipidemia  Non-STEMI (non-ST elevated myocardial infarction)   ASSESSMENT AND PLAN:  1. CAD/NSTEMI: Cardiac cath with diffusely diseased LAD from the ostium to the distal vessel with calcification and relatively small size. Medical management was recommended. The Circumflex and RCA have non-obstructive disease. She is being managed with ASA, statin, beta blocker, ARB, Calcium channel blocker, Imdur and Plavix,  CP resolved this am. Will transfer to tele and ambulate. Increase Imdur. Will need aggressive cardiac rehab. Possibly home tomorrow or Monday if CP controlled.    Daniel Bensimhon  8/17/20138:49 AM

## 2011-09-30 NOTE — Progress Notes (Signed)
CARDIAC REHAB PHASE I   PRE:  Rate/Rhythm: 72 SR    BP: sitting 105/54    SaO2:   MODE:  Ambulation: 780 ft   POST:  Rate/Rhythm: 87 SR    BP: sitting 117/68     SaO2:   Tolerated fairly well. Denied CP (none at all) although does st she just feels tired today. VSS. Asked about what echo showed. Told pt results and encouraged to discuss with MD tomorrow. Gave HF booklet and discussed daily wts and low sodium. [id number]  Connie Owens CES, ACSM

## 2011-10-01 MED ORDER — METOPROLOL TARTRATE 25 MG PO TABS: 25.0000 mg | ORAL_TABLET | Freq: Two times a day (BID) | ORAL | Status: DC

## 2011-10-01 MED ORDER — LOSARTAN POTASSIUM 50 MG PO TABS: 50.0000 mg | ORAL_TABLET | Freq: Every day | ORAL | Status: DC

## 2011-10-01 MED ORDER — ISOSORBIDE MONONITRATE ER 30 MG PO TB24: 30.0000 mg | ORAL_TABLET | Freq: Two times a day (BID) | ORAL | Status: DC

## 2011-10-01 MED ORDER — CLOPIDOGREL BISULFATE 75 MG PO TABS: 75.0000 mg | ORAL_TABLET | Freq: Every day | ORAL | Status: DC

## 2011-10-01 MED ORDER — ATORVASTATIN CALCIUM 80 MG PO TABS: 80.0000 mg | ORAL_TABLET | Freq: Every day | ORAL | Status: DC

## 2011-10-01 MED ORDER — NITROGLYCERIN 0.4 MG SL SUBL: 0.4000 mg | SUBLINGUAL_TABLET | SUBLINGUAL | Status: DC | PRN

## 2011-10-01 MED ORDER — ASPIRIN 81 MG PO TBEC: 81.0000 mg | DELAYED_RELEASE_TABLET | Freq: Every day | ORAL | Status: AC

## 2011-10-01 NOTE — Discharge Summary (Signed)
Physician Discharge Summary  Patient ID: Connie Owens MRN: [id number] DOB/AGE: 1945/12/25 66 y.o.  Admit date: 09/27/2011 Discharge date: 10/01/2011  Primary Discharge Diagnosis; 1. NSTEMI 2. CAD  Secondary Discharge Diagnosis 1. Hypertension 2. Anxiety  Significant Diagnostic Studies: 09/28/2011  Cardiac Catheterization: Coronary angiography:  Coronary dominance: Right  Left mainstem: Ostial 50%, distal calcified 30%  Left anterior descending (LAD): Heavy proximal calcification with long 50% stenosis and ostial 60 - 70% stenosis. Mid diffuse disease with long mid 70%. Distal and apical diffuse plaque nonobstructive but the vessel is very small in this distribution. D1 moderate sized with proximal 30%. D2 small and long with proximal 40%  Left circumflex (LCx): AV mild proximal plaque. Ramus intermedius small and normal. OM1 very large and normal. OM2 and OM3 very small and normal. Small PL x 2  Right coronary artery (RCA): Large vessel. Ostial 25% with spasm. Mid long 40%. Acute marginal large and normal. PDA large and normal.  Left ventriculography: Left ventricular systolic function with anterior mid hypokinesis. LVEF is estimated at 50%, there is no significant mitral regurgitation The LV was incompletely filled.  Final Conclusions: NQWMI. 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 Wall motion abnormality would suggest that that LAD is the culprit.  Recommendations: The LAD is not amenable to PCI given the ostial disease and the diffuse nature of the disease. CABG could be considered for continued pain although the LAD is not a large vessel. I would suggest medical management and risk reduction with revascularization if she has continued pain. .    Echocardiogram: 09/29/2011  Left ventricle: POor acoustic windows. Definity was used. LVEF is approximately 30 to 35% with akinesis of the inferior (mid), posterior, lateral (base, mid) walls. Severe hypokinesis of the anterior wall. The cavity  size was normal. Wall thickness was increased in a pattern of mild LVH. - Mitral valve: Mild regurgitation. - Pulmonary arteries: PA peak pressure: 40mm Hg (S).   Consults: None  Hospital Course:  Connie Owens is a 66 year old patient of Dr. Willa Rough who had complaints of chest pain around 9 PM the day prior to admission. She was seen in the emergency room with no diagnostic EKG changes. She was found to have mildly elevated troponin of 0.9. Second troponin of 6.01. She continued to have increasing chest discomfort. It was determined that she needed to have cardiac catheterization as soon as possible. She received contrast dye prophylaxis prior to that procedure. Cardiac catheterization was completed by Dr. Rollene Rotunda, , with moderate LAD disease. It was heavily calcified in the proximal area with a long 50% stenosis and ostial 60-70% stenosis. She had further disease in the distal LAD. It was felt that the distal vessel with calcification was relatively small in size and medical management was recommended. The circumflex and the RCA was found to have nonobstructive disease. She is was placed on aspirin. Plavix, metoprolol, Cozaar, and Imdur. Echocardiogram was completed with results discussed above. She had a long discussion with Dr.Nishan prior to discharge with multiple questions answered and explanations given to her. She will return home with followup appointment with Dr. Myrtis Ser in 2-3 weeks. It is recommended that an echocardiogram be repeated, or preferably cardiac MRI in 6-8 weeks to reassess ejection fraction.  Discharge Exam: Blood pressure 105/65, pulse 64, temperature 98.7 F (37.1 C), temperature source Oral, resp. rate 18, height 5\' 5"  (1.651 m), weight 210 lb (95.255 kg), SpO2 95.00%.    Labs:   Lab Results  Component Value Date  WBC 7.9 09/30/2011   HGB 12.1 09/30/2011   HCT 36.5 09/30/2011   MCV 91.0 09/30/2011   PLT 218 09/30/2011     Lab 09/30/11 0610 09/28/11 0218    NA 140 --  K 3.8 --  CL 106 --  CO2 25 --  BUN 14 --  CREATININE 0.76 --  CALCIUM 9.1 --  PROT -- 6.6  BILITOT -- 0.3  ALKPHOS -- 72  ALT -- 19  AST -- 36  GLUCOSE 95 --   Lab Results  Component Value Date   CKTOTAL 244* 09/28/2011   CKMB 14.2* 09/28/2011   TROPONINI 2.39* 09/28/2011    Lab Results  Component Value Date   CHOL 190 09/28/2011   CHOL 200 01/20/2011   CHOL 205* 12/11/2008   Lab Results  Component Value Date   HDL 53 09/28/2011   HDL 50.50 01/20/2011   HDL 21.30 12/11/2008   Lab Results  Component Value Date   LDLCALC 118* 09/28/2011   LDLCALC 114* 01/20/2011   Lab Results  Component Value Date   TRIG 96 09/28/2011   TRIG 178.0* 01/20/2011   TRIG 151.0* 12/11/2008   Lab Results  Component Value Date   CHOLHDL 3.6 09/28/2011   CHOLHDL 4 01/20/2011   CHOLHDL 4 12/11/2008   Lab Results  Component Value Date   LDLDIRECT 146.7 12/11/2008      Radiology: Dg Chest Portable 1 View  09/27/2011  *RADIOLOGY REPORT*  Clinical Data: Chest pain.  Short of breath.  Hypertension.  PORTABLE CHEST - 1 VIEW  Comparison: 11/29/2010.  Findings: Low volume chest.  Hiatal hernia.  Pulmonary vascular congestion.  Basilar atelectasis.  Interstitial edema. Monitoring leads are projected over the chest.  Mediastinal contours appear within normal limits allowing for low volumes.  IMPRESSION: Low volume chest with mild CHF.  Original Report Authenticated By: Andreas Newport, M.D.    FOLLOW UP PLANS AND APPOINTMENTS Discharge Orders    Future Orders Please Complete By Expires   Amb Referral to Cardiac Rehabilitation      Diet - low sodium heart healthy      Increase activity slowly        Medication List  As of 10/01/2011 12:07 PM   TAKE these medications         aspirin 81 MG EC tablet   Take 1 tablet (81 mg total) by mouth daily.      atorvastatin 80 MG tablet   Commonly known as: LIPITOR   Take 1 tablet (80 mg total) by mouth daily at 6 PM.      cholecalciferol 1000  UNITS tablet   Commonly known as: VITAMIN D   Take 2,000 Units by mouth daily.      clopidogrel 75 MG tablet   Commonly known as: PLAVIX   Take 1 tablet (75 mg total) by mouth daily with breakfast.      COMBIGAN 0.2-0.5 % ophthalmic solution   Generic drug: brimonidine-timolol   Place 1 drop into the left eye 2 (two) times daily.      esomeprazole 40 MG capsule   Commonly known as: NEXIUM   Take 1 capsule (40 mg total) by mouth daily before breakfast.      estradiol 1 MG tablet   Commonly known as: ESTRACE   Take 1 mg by mouth daily.      fluorometholone 0.25 % ophthalmic suspension   Commonly known as: FML FORTE   Place 1 drop into the left eye every morning.  gabapentin 100 MG capsule   Commonly known as: NEURONTIN   Take 100 mg by mouth 3 (three) times daily as needed. For pain      isosorbide mononitrate 30 MG 24 hr tablet   Commonly known as: IMDUR   Take 1 tablet (30 mg total) by mouth 2 (two) times daily.      loperamide 2 MG capsule   Commonly known as: IMODIUM   Take 2 mg by mouth 4 (four) times daily as needed. For loose stool      losartan 50 MG tablet   Commonly known as: COZAAR   Take 1 tablet (50 mg total) by mouth daily.      LUBRICANT EYE DROPS OP   Apply 1 drop to eye daily as needed. For dry or itchy eyes.      metoprolol tartrate 25 MG tablet   Commonly known as: LOPRESSOR   Take 1 tablet (25 mg total) by mouth 2 (two) times daily.      nitroGLYCERIN 0.4 MG SL tablet   Commonly known as: NITROSTAT   Place 1 tablet (0.4 mg total) under the tongue every 5 (five) minutes x 3 doses as needed for chest pain.      prednisoLONE acetate 1 % ophthalmic suspension   Commonly known as: PRED FORTE   Place 1 drop into the right eye 4 (four) times daily.      psyllium 58.6 % powder   Commonly known as: METAMUCIL   Take 1 packet by mouth 3 (three) times daily as needed. For regularity      zolpidem 10 MG tablet   Commonly known as: AMBIEN   Take 10  mg by mouth at bedtime.           Follow-up Information    Follow up with Willa Rough, MD. (Our office will call you for appointment for 2-3 week follow up.)    Contact information:   [address] [place] [address] [phone]       Follow up with Quentin Mulling, MD. (Please call him for follow-up appoinment.)    Contact information:   [address] [place] [address] [phone]            Time spent with patient to include physician time:40 minutes Signed: Joni Reining 10/01/2011, 12:07 PM Co-Sign MD

## 2011-10-01 NOTE — Progress Notes (Signed)
Patient ID: Connie Owens, female   DOB: [birth year], 66 y.o.   MRN: [id number]    SUBJECTIVE:   No chest pain  Lots of ? About her blockages and echo results  Went over both with her.    BP 98/61  Pulse 60  Temp 98.7 F (37.1 C) (Oral)  Resp 18  Ht 5\' 5"  (1.651 m)  Wt 210 lb (95.255 kg)  BMI 34.95 kg/m2  SpO2 95%  Intake/Output Summary (Last 24 hours) at 10/01/11 0919 Last data filed at 10/01/11 0900  Gross per 24 hour  Intake    840 ml  Output      0 ml  Net    840 ml    PHYSICAL EXAM General: Well developed, well nourished, in no acute distress. Alert and oriented x 3.  Psych:  Good affect, responds appropriately Neck: No JVD. No masses noted.  Lungs: Clear bilaterally with no wheezes or rhonci noted.  Heart: RRR with no murmurs noted. Abdomen: Bowel sounds are present. Soft, non-tender.  Extremities: No lower extremity edema. Right groin cath site ok.   LABS: Basic Metabolic Panel:  Basename 09/30/11 0610 09/29/11 0531  NA 140 140  K 3.8 3.7  CL 106 108  CO2 25 20  GLUCOSE 95 112*  BUN 14 9  CREATININE 0.76 0.61  CALCIUM 9.1 9.3  MG -- --  PHOS -- --   CBC:  Basename 09/30/11 0610 09/29/11 0531  WBC 7.9 10.6*  NEUTROABS -- --  HGB 12.1 12.0  HCT 36.5 35.2*  MCV 91.0 89.8  PLT 218 237   Cardiac Enzymes:  Basename 09/28/11 1610  CKTOTAL 244*  CKMB 14.2*  CKMBINDEX --  TROPONINI 2.39*   Fasting Lipid Panel: No results found for this basename: CHOL,HDL,LDLCALC,TRIG,CHOLHDL,LDLDIRECT in the last 72 hours  Current Meds:    . aspirin EC  81 mg Oral Daily  . atorvastatin  80 mg Oral q1800  . cholecalciferol  2,000 Units Oral Daily  . clopidogrel  75 mg Oral Q breakfast  . estradiol  1 mg Oral Daily  . gabapentin  100 mg Oral TID  . isosorbide mononitrate  30 mg Oral BID  . losartan  50 mg Oral Daily  . metoprolol tartrate  25 mg Oral BID  . pantoprazole  40 mg Oral Daily  . prednisoLONE acetate  2 drop Right Eye TID AC & HS     Principal Problem:  *NSTEMI (non-ST elevated myocardial infarction) Active Problems:  HYPERTENSION, ESSENTIAL NOS  GERD  Hyperlipidemia  Non-STEMI (non-ST elevated myocardial infarction)   ASSESSMENT AND PLAN:  1. CAD/NSTEMI:  Reviewed her cath films and agree that LAD disease is not ideal for PCI or CABG  She is on good medical Rx and free of pain D/C home  F/U Dr Myrtis Ser in 3-4 weeks.  Needs SL nitro also.  Would consider repeat echo or preferably cardiac MRI in 6-8weeks to reassess EF And possible need for AICD.  No ectopy on telemetry.  Primary is Louann Liv  8/18/20139:19 AM

## 2011-10-03 ENCOUNTER — Telehealth: Payer: Self-pay | Admitting: Internal Medicine

## 2011-10-03 ENCOUNTER — Telehealth: Payer: Self-pay | Admitting: Cardiology

## 2011-10-03 NOTE — Telephone Encounter (Signed)
Connie Owens states she has had a "severe headache" from one of her new meds she was given on d/c.  The h.a. starts about an hour and a half after she takes her morning meds.  It doesn't go away until sometime while she is sleeping.  She is unsure which med is causing the problem.  Please advise.

## 2011-10-03 NOTE — Telephone Encounter (Signed)
Please return call to patient regarding terrible headaches after taking medicine.  Pt can be reached at [phone] .

## 2011-10-03 NOTE — Telephone Encounter (Signed)
Error

## 2011-10-04 NOTE — Telephone Encounter (Signed)
Pt was notified and agrees.  She will call back next week and let me know how she is doing.

## 2011-10-04 NOTE — Telephone Encounter (Signed)
Stop the Imdur.

## 2011-10-06 ENCOUNTER — Ambulatory Visit (INDEPENDENT_AMBULATORY_CARE_PROVIDER_SITE_OTHER)
Admission: RE | Admit: 2011-10-06 | Discharge: 2011-10-06 | Disposition: A | Discharge status: Home / Self Care | Payer: Medicare Other | Source: Ambulatory Visit | Attending: Internal Medicine | Admitting: Internal Medicine

## 2011-10-06 ENCOUNTER — Ambulatory Visit (INDEPENDENT_AMBULATORY_CARE_PROVIDER_SITE_OTHER): Payer: Medicare Other | Admitting: Internal Medicine

## 2011-10-06 ENCOUNTER — Encounter: Payer: Self-pay | Admitting: Internal Medicine

## 2011-10-06 VITALS — BP 128/80 | HR 72 | Temp 98.1°F | Wt 203.4 lb

## 2011-10-06 DIAGNOSIS — I2510 Atherosclerotic heart disease of native coronary artery without angina pectoris: Secondary | ICD-10-CM | POA: Diagnosis not present

## 2011-10-06 DIAGNOSIS — K449 Diaphragmatic hernia without obstruction or gangrene: Secondary | ICD-10-CM | POA: Diagnosis not present

## 2011-10-06 DIAGNOSIS — M546 Pain in thoracic spine: Secondary | ICD-10-CM

## 2011-10-06 DIAGNOSIS — R51 Headache: Secondary | ICD-10-CM

## 2011-10-06 DIAGNOSIS — R079 Chest pain, unspecified: Secondary | ICD-10-CM | POA: Diagnosis not present

## 2011-10-06 LAB — SEDIMENTATION RATE: Sed Rate: 29 mm/h — ABNORMAL HIGH (ref 0–22)

## 2011-10-06 MED FILL — Perflutren Lipid Microsphere IV Susp 6.52 MG/ML: INTRAVENOUS | Qty: 2 | Status: AC

## 2011-10-06 NOTE — Progress Notes (Signed)
Subjective:    Patient ID: Connie Owens, female    DOB: [birth year], 66 y.o.   MRN: [id number]  HPI She sustained a Non STEMI 09/27/11. Cath was performed 8/15; medical treatment recommended. Her prior advanced c holesterol panel that suggested her LDL goal was less than 160; with this acute event her LDL goal is now less than 70. She was prescribed atorvastatin 80 mg daily. Since discharge she's noted some intermittent "fluttering".She denies chest pain, dyspnea, edema, paroxysmal nocturnal dyspnea, or claudication.   Review of Systems She's had daily, intermittent sharp pain over the left crown for 4-5 minutes since February. Tylenol helps some. During the same period time she's noted soreness in the left posterior lateral thorax under the arm which appears to be positional, worse in the left lateral decubitus position, partially reclining  or with deep breath. There's been no associated nausea, vomiting, photophobia, phonophobia the  with headaches. 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 The headaches are similar to migraines she had in the 30s. She denies associated fever, neck pain, or severe neck stiffness with headaches. There is no associated vision, speech, swallowing issues other than the visual issues from her corneal transplants. She has no limb weakness, numbness, or tingling. There's no associated altered mental status the headaches. Significantly she's been placed on Plavix. The thoracic discomfort has not been associated with cough, hemoptysis  or sputum production. She denies any associated stool urinary incontinence. She's had no associated rash with the headache or thoracic pain. There was no recent trauma prior to either. She fell and dislocated her left shoulder 3/12 PMH of osteoporosis or chronic steroid use: no           Objective:   Physical Exam Gen.:  well-nourished in appearance. Alert, appropriate and cooperative throughout exam. Head:  Normocephalic without obvious abnormalities; tender to palpation over the left crown. No scalp lesions present Eyes: No corneal or conjunctival inflammation noted. Field of vision normal. Extraocular motion intact.  Ears: External  ear exam reveals no significant lesions or deformities. Canals clear .TMs normal. Hearing is grossly normal bilaterally. Nose: External nasal exam reveals no deformity or inflammation. Nasal mucosa are pink and moist. No lesions or exudates noted.  Mouth: Oral mucosa and oropharynx reveal no lesions or exudates. Teeth in good repair. Neck: No deformities, masses, or tenderness noted. Range of motion & Thyroid  Normal. Lungs: Normal respiratory effort; chest expands symmetrically. Lungs are clear to auscultation without rales, wheezes, or increased work of breathing.  Chest: Marked localized tenderness to palpation in the left lateral posterior chest area Heart: Normal rate and rhythm. Normal S1 and S2. No gallop, click, or rub.Grade 1/2 systolic murmur.                                                                            Musculoskeletal/extremities: lordosis  noted of  the thoracic  spine. No clubbing, cyanosis,  or deformity noted. Lipedema @ ankles .Tone & strength  normal.Joints normal. Nail health  good. Vascular: Carotid, radial artery, dorsalis pedis and  posterior tibial pulses are full and equal. No bruits present. Neurologic: Alert and oriented x3. Deep tendon reflexes symmetrical and normal.  Skin: Intact without suspicious lesions or rashes. Scattered ecchymoses especially left upper extremity Lymph: No cervical, axillary lymphadenopathy present. Psych: Mood and affect are normal. Normally interactive                                                                                         Assessment & Plan:  #1 left sided headaches with tenderness to palpation. She states it is similar to migraines but the tenderness is worrisome for  temporal arteritis. Sedimentation rate will be collected. A trial of increased dose gabapentin is indicated. Tryptan agents are contraindicated  #2 chest discomfort, localized. Rule out rib injury with detail films. In the differential is a thoracic radiculopathy. If this were present it also should respond to  gabapentin.

## 2011-10-06 NOTE — Patient Instructions (Addendum)
Order for x-rays entered into  the computer; these will be performed at [facility] across from [facility]. No appointment is necessary.  Increase Gabapentin up to a total of 3 pills every 8 hours as needed. This increase of 1 pill each dose  should take place over 72 hours at least.   If you activate My Chart; the results can be released to you as soon as they populate from the lab. If you choose not to use this program; the labs have to be reviewed, copied & mailed   causing a delay in getting the results to you.

## 2011-10-18 ENCOUNTER — Encounter: Payer: Medicare Other | Admitting: Cardiology

## 2011-10-19 ENCOUNTER — Encounter: Payer: Self-pay | Admitting: Physician Assistant

## 2011-10-19 ENCOUNTER — Ambulatory Visit (INDEPENDENT_AMBULATORY_CARE_PROVIDER_SITE_OTHER): Payer: Medicare Other | Admitting: Physician Assistant

## 2011-10-19 ENCOUNTER — Telehealth: Payer: Self-pay | Admitting: *Deleted

## 2011-10-19 VITALS — BP 123/65 | HR 56 | Ht 65.0 in | Wt 205.8 lb

## 2011-10-19 DIAGNOSIS — I10 Essential (primary) hypertension: Secondary | ICD-10-CM | POA: Diagnosis not present

## 2011-10-19 DIAGNOSIS — I2589 Other forms of chronic ischemic heart disease: Secondary | ICD-10-CM | POA: Diagnosis not present

## 2011-10-19 DIAGNOSIS — I2510 Atherosclerotic heart disease of native coronary artery without angina pectoris: Secondary | ICD-10-CM | POA: Diagnosis not present

## 2011-10-19 DIAGNOSIS — E785 Hyperlipidemia, unspecified: Secondary | ICD-10-CM

## 2011-10-19 MED ORDER — CARVEDILOL 3.125 MG PO TABS: 3.1250 mg | ORAL_TABLET | Freq: Two times a day (BID) | ORAL | Status: DC

## 2011-10-19 NOTE — Progress Notes (Addendum)
[address] [place], [place]  [address] Phone: [phone] Fax:  [phone]  Date:  10/19/2011   Name:  Connie Owens   DOB:  [birth year]   MRN:  [id number]  PCP:  [name], MD  Primary Cardiologist:  Dr. Zackery Barefoot => will change to Dr. Rollene Rotunda  Primary Electrophysiologist:  None    History of Present Illness: Connie Owens is a 66 y.o. female who returns for post hospital follow up.  She has a history of HTN, HL. She was admitted 8/14-8/18 with a non--STEMI. LHC demonstrated ostial and prox LAD disease not amenable to PCI.  Med Rx recommended unless she has refractory angina.  CABG would need to be considered at that time.  EF 30-35% by echo.  Med Rx initiated for ICM.  Followup echocardiogram versus MRI suggested in 6-8 weeks.  She is doing well since discharge. Denies chest pain. She has an occasional flutter in her chest. She denies syncope or near-syncope. She does get lightheaded at times. She denies orthopnea, PND or edema. She denies significant dyspnea. She denies recurrent angina the   Past Medical History  Diagnosis Date  . CAD (coronary artery disease)     NSTEMI 8/13 => LHC 09/27/11: oLM 50%, dLM 30%, oLAD 60-70%, pLAD 50%, mLAD 70%, pD1 30%, pD2 40%, oRCA 25%, mRCA 40%, EF 50% with ant HK => LAD not amenable to PCI => med Rx  unless recurrent angina = > consider CABG  . Ischemic cardiomyopathy     Echo 09/29/11: EF 30-35% with inferior, posterior, lateral AK, anterior severe HK, mild LVH, mild MR, PASP 40  . Corneal disorder   . Postmenopausal status   . Back pain   . Dysmetabolic syndrome X   . Arthritis     osteo...right knee  . Carpal tunnel syndrome   . GERD (gastroesophageal reflux disease)   . Diverticular disease   . Hx of adenomatous colonic polyps   . Hernia     hiatal  . Anxiety state     NOS  . Hypertension     essen. NOS  . Shoulder fracture   . HLD (hyperlipidemia)     Current Outpatient  Prescriptions  Medication Sig Dispense Refill  . aspirin EC 81 MG EC tablet Take 1 tablet (81 mg total) by mouth daily.  30 tablet  6  . atorvastatin (LIPITOR) 80 MG tablet Take 1 tablet (80 mg total) by mouth daily at 6 PM.  30 tablet  6  . brimonidine-timolol (COMBIGAN) 0.2-0.5 % ophthalmic solution Place 1 drop into the left eye 2 (two) times daily.      . Carboxymethylcellulose Sodium (LUBRICANT EYE DROPS OP) Apply 1 drop to eye daily as needed. For dry or itchy eyes.       . cholecalciferol (VITAMIN D) 1000 UNITS tablet Take 2,000 Units by mouth daily.      . clopidogrel (PLAVIX) 75 MG tablet Take 1 tablet (75 mg total) by mouth daily with breakfast.  30 tablet  6  . esomeprazole (NEXIUM) 40 MG capsule Take 1 capsule (40 mg total) by mouth daily before breakfast.  90 capsule  3  . estradiol (ESTRACE) 1 MG tablet Take 1 mg by mouth daily.        . fluorometholone (FML FORTE) 0.25 % ophthalmic suspension Place 1 drop into the left eye every morning.      . gabapentin (NEURONTIN) 100 MG capsule Take 200 mg by mouth 3 (  three) times daily as needed. For pain      . loperamide (IMODIUM) 2 MG capsule Take 2 mg by mouth 4 (four) times daily as needed. For loose stool      . losartan (COZAAR) 50 MG tablet Take 50 mg by mouth. 1/2 by mouth once daily (changed in hospital)       . metoprolol tartrate (LOPRESSOR) 25 MG tablet Take 1 tablet (25 mg total) by mouth 2 (two) times daily.  60 tablet  6  . nitroGLYCERIN (NITROSTAT) 0.4 MG SL tablet Place 1 tablet (0.4 mg total) under the tongue every 5 (five) minutes x 3 doses as needed for chest pain.  30 tablet  6  . prednisoLONE acetate (PRED FORTE) 1 % ophthalmic suspension Place 1 drop into the right eye 4 (four) times daily.      . psyllium (METAMUCIL) 58.6 % powder Take 1 packet by mouth 3 (three) times daily as needed. For regularity      . zolpidem (AMBIEN) 10 MG tablet Take 10 mg by mouth at bedtime.          Allergies: Allergies  Allergen  Reactions  . Iohexol     rash  . Iodine   . Latex   . Tape   . Imdur (Isosorbide)     Severe headaches  . Verapamil     REACTION: dizziness    History  Substance Use Topics  . Smoking status: Never Smoker   . Smokeless tobacco: Never Used  . Alcohol Use: Yes     rare     ROS:  Please see the history of present illness.    All other systems reviewed and negative.   PHYSICAL EXAM: VS:  BP 123/65  Pulse 56  Ht 5\' 5"  (1.651 m)  Wt 205 lb 12.8 oz (93.35 kg)  BMI 34.25 kg/m2 Well nourished, well developed, in no acute distress HEENT: normal Neck: no JVD Cardiac:  normal S1, S2; RRR; no murmur Lungs:  clear to auscultation bilaterally, no wheezing, rhonchi or rales Abd: soft, nontender, no hepatomegaly Ext: no edema; right groin without hematoma or bruit  Skin: warm and dry Neuro:  CNs 2-12 intact, no focal abnormalities noted  EKG:  Sinus bradycardia, heart rate 56, normal axis, inferolateral T wave inversions      ASSESSMENT AND PLAN:  1. Coronary Artery Disease: She is doing well post non--STEMI. Continued medical therapy. She will remain on aspirin, Plavix and statin. She's not having any current angina. Refer to cardiac rehabilitation.  2. Ischemic Cardiomyopathy: She has been somewhat lightheaded recently. She may experience less bradycardia with changing to low dose Coreg in view of metoprolol. Therefore, I will change recorded 3.125 mg twice a day and stop her metoprolol. Continue Cozaar. Followup with me in 2 weeks. Due to Dr. Henrietta Hoover absence I will switch her to Dr. Antoine Poche who also saw her in the hospital. I will have her see Dr. Antoine Poche in 4-6 weeks with an echocardiogram prior to that visit to reassess her LV function.  3. Hyperlipidemia: Arrange followup lipids and LFTs.  4. Hypertension: Controlled.  Signed, Tereso Newcomer, PA-C  11:47 AM 10/19/2011

## 2011-10-19 NOTE — Patient Instructions (Addendum)
Your physician has recommended you make the following change in your medication: DISCONTINUE LOPRESSOR, START COREG (3.125 MG) TWICE A DAY. THAT WAS SENT IN TO YOUR PHARMACY   Your physician recommends that you return for a FASTING lipid profile: AND LFT IN 6 WEEKS WHEN YOU COME IN FOR YOUR ECHO AND YOUR APPOINTMENT WITH DR. [facility]  Your physician has requested that you have an echocardiogram.DX:  414.01,414.8,272.4 Echocardiography is a painless test that uses sound waves to create images of your heart. It provides your doctor with information about the size and shape of your heart and how well your heart's chambers and valves are working. This procedure takes approximately one hour. There are no restrictions for this procedure. HAVE ECHO BEFORE YOU HAVE APPOINTMENT WITH DR. HOCHREIN IN 6 WEEKS   Your physician recommends that you schedule a follow-up appointment in: WITH SCOTT WEAVER, PA-C IN 2 WEEKS   Your physician recommends that you schedule a follow-up appointment WITH DR. HOCHREIN IN 6 WEEKS

## 2011-10-19 NOTE — Telephone Encounter (Signed)
let pt know cardiac rehab papers were faxed over

## 2011-10-26 ENCOUNTER — Other Ambulatory Visit: Payer: Self-pay | Admitting: Internal Medicine

## 2011-10-26 DIAGNOSIS — Z1231 Encounter for screening mammogram for malignant neoplasm of breast: Secondary | ICD-10-CM

## 2011-11-02 ENCOUNTER — Ambulatory Visit (INDEPENDENT_AMBULATORY_CARE_PROVIDER_SITE_OTHER): Payer: Medicare Other | Admitting: Physician Assistant

## 2011-11-02 ENCOUNTER — Encounter: Payer: Self-pay | Admitting: Physician Assistant

## 2011-11-02 VITALS — BP 125/66 | HR 72 | Ht 65.0 in | Wt 205.1 lb

## 2011-11-02 DIAGNOSIS — I2510 Atherosclerotic heart disease of native coronary artery without angina pectoris: Secondary | ICD-10-CM

## 2011-11-02 DIAGNOSIS — E785 Hyperlipidemia, unspecified: Secondary | ICD-10-CM

## 2011-11-02 DIAGNOSIS — I2589 Other forms of chronic ischemic heart disease: Secondary | ICD-10-CM

## 2011-11-02 NOTE — Progress Notes (Signed)
[address] [place], [place]  [address] Phone: [phone] Fax:  [phone]  Date:  11/02/2011   Name:  Connie Owens   DOB:  [birth year]   MRN:  [id number]  PCP:  [name], MD  Primary Cardiologist:  Dr. Rollene Rotunda  Primary Electrophysiologist:  None    History of Present Illness: Connie Owens is a 67 y.o. female who returns for follow up.  She has a hx of CAD, HTN, HL. Admitted last month with a non--STEMI. LHC demonstrated ostial and prox LAD disease not amenable to PCI.  Med Rx recommended unless she has refractory angina.  CABG would need to be considered at that time.  EF 30-35% by echo.  Med Rx initiated for ICM.  Followup echocardiogram versus MRI suggested in 6-8 weeks.  I saw her 2 weeks ago.  I changed her Toprol to Coreg.  She is doing well.  Feels less dizzy, unless she takes all of her morning pills at once.  No chest pain.  Feels a "flutter" sometimes. No dyspnea, orthopnea, PND, edema.  No syncope.     Past Medical History  Diagnosis Date  . CAD (coronary artery disease)     NSTEMI 8/13 => LHC 09/27/11: oLM 50%, dLM 30%, oLAD 60-70%, pLAD 50%, mLAD 70%, pD1 30%, pD2 40%, oRCA 25%, mRCA 40%, EF 50% with ant HK => LAD not amenable to PCI => med Rx  unless recurrent angina = > consider CABG  . Ischemic cardiomyopathy     Echo 09/29/11: EF 30-35% with inferior, posterior, lateral AK, anterior severe HK, mild LVH, mild MR, PASP 40  . Corneal disorder   . Postmenopausal status   . Back pain   . Dysmetabolic syndrome X   . Arthritis     osteo...right knee  . Carpal tunnel syndrome   . GERD (gastroesophageal reflux disease)   . Diverticular disease   . Hx of adenomatous colonic polyps   . Hernia     hiatal  . Anxiety state     NOS  . Hypertension     essen. NOS  . Shoulder fracture   . HLD (hyperlipidemia)     Current Outpatient Prescriptions  Medication Sig Dispense Refill  . aspirin EC 81 MG EC tablet Take 1 tablet  (81 mg total) by mouth daily.  30 tablet  6  . atorvastatin (LIPITOR) 80 MG tablet Take 1 tablet (80 mg total) by mouth daily at 6 PM.  30 tablet  6  . brimonidine-timolol (COMBIGAN) 0.2-0.5 % ophthalmic solution Place 1 drop into the left eye 2 (two) times daily.      . Carboxymethylcellulose Sodium (LUBRICANT EYE DROPS OP) Apply 1 drop to eye daily as needed. For dry or itchy eyes.       . carvedilol (COREG) 3.125 MG tablet Take 1 tablet (3.125 mg total) by mouth 2 (two) times daily.  60 tablet  11  . cholecalciferol (VITAMIN D) 1000 UNITS tablet Take 2,000 Units by mouth daily.      . clopidogrel (PLAVIX) 75 MG tablet Take 1 tablet (75 mg total) by mouth daily with breakfast.  30 tablet  6  . esomeprazole (NEXIUM) 40 MG capsule Take 1 capsule (40 mg total) by mouth daily before breakfast.  90 capsule  3  . estradiol (ESTRACE) 1 MG tablet Take 1 mg by mouth daily.        . fluorometholone (FML FORTE) 0.25 % ophthalmic suspension Place 1 drop  into the left eye every morning.      . gabapentin (NEURONTIN) 100 MG capsule Take 200 mg by mouth 3 (three) times daily as needed. For pain      . loperamide (IMODIUM) 2 MG capsule Take 2 mg by mouth 4 (four) times daily as needed. For loose stool      . losartan (COZAAR) 50 MG tablet Take 50 mg by mouth. 1/2 by mouth once daily (changed in hospital)       . nitroGLYCERIN (NITROSTAT) 0.4 MG SL tablet Place 1 tablet (0.4 mg total) under the tongue every 5 (five) minutes x 3 doses as needed for chest pain.  30 tablet  6  . prednisoLONE acetate (PRED FORTE) 1 % ophthalmic suspension Place 1 drop into the right eye 4 (four) times daily.      . psyllium (METAMUCIL) 58.6 % powder Take 1 packet by mouth 3 (three) times daily as needed. For regularity      . zolpidem (AMBIEN) 10 MG tablet Take 10 mg by mouth at bedtime.          Allergies: Allergies  Allergen Reactions  . Iohexol     rash  . Iodine   . Latex   . Tape   . Imdur (Isosorbide)     Severe  headaches  . Verapamil     REACTION: dizziness    History  Substance Use Topics  . Smoking status: Never Smoker   . Smokeless tobacco: Never Used  . Alcohol Use: Yes     rare     PHYSICAL EXAM: VS:  BP 125/66  Pulse 72  Ht 5\' 5"  (1.651 m)  Wt 205 lb 1.9 oz (93.042 kg)  BMI 34.13 kg/m2 Well nourished, well developed, in no acute distress HEENT: normal Neck: no JVD Cardiac:  normal S1, S2; RRR; no murmur Lungs:  clear to auscultation bilaterally, no wheezing, rhonchi or rales Abd: soft, nontender, no hepatomegaly Ext: no edema  Skin: warm and dry Neuro:  CNs 2-12 intact, no focal abnormalities noted  EKG:  Sinus rhythm, HR 67, normal axis, inferolateral T wave inversions      ASSESSMENT AND PLAN:  1. Coronary Artery Disease:  Continue ASA, Plavix, statin.  She is being referred to cardiac rehabilitation.  2. Ischemic Cardiomyopathy:  She gets dizzy when she take coreg and cozaar together.  Continue current dose of ACE and beta blocker.  Advised her to change timing to avoid side effects.  Echo and follow up with Dr. Rollene Rotunda pending next month.    3. Hyperlipidemia:  Continue Lipitor.   4. Hypertension: Controlled.  Luna Glasgow, PA-C  11:04 AM 11/02/2011

## 2011-11-02 NOTE — Patient Instructions (Addendum)
Try taking Coreg (Carvedilol) at 9 AM and 9 PM. Try taking Cozaar (Losartan) at 12 or 1 PM. Keep your follow up appointments as scheduled.

## 2011-11-07 ENCOUNTER — Ambulatory Visit (INDEPENDENT_AMBULATORY_CARE_PROVIDER_SITE_OTHER): Payer: Medicare Other

## 2011-11-07 DIAGNOSIS — Z23 Encounter for immunization: Secondary | ICD-10-CM

## 2011-11-09 ENCOUNTER — Encounter (HOSPITAL_COMMUNITY)
Admission: RE | Admit: 2011-11-09 | Discharge: 2011-11-09 | Disposition: A | Discharge status: Home / Self Care | Payer: Medicare Other | Source: Ambulatory Visit | Attending: Cardiology | Admitting: Cardiology

## 2011-11-09 DIAGNOSIS — Z5189 Encounter for other specified aftercare: Secondary | ICD-10-CM | POA: Insufficient documentation

## 2011-11-09 DIAGNOSIS — I214 Non-ST elevation (NSTEMI) myocardial infarction: Secondary | ICD-10-CM | POA: Insufficient documentation

## 2011-11-09 NOTE — Progress Notes (Signed)
Cardiac Rehab Medication Review by a Pharmacist  Does the patient  feel that his/her medications are working for him/her?  yes  Has the patient been experiencing any side effects to the medications prescribed?  yes  Does the patient measure his/her own blood pressure or blood glucose at home?  yes   Does the patient have any problems obtaining medications due to transportation or finances?   yes  Understanding of regimen: excellent Understanding of indications: excellent Potential of compliance: excellent    Pharmacist comments: Patient endorses some large muscle leg pain when taking atorvastatin. Counseled patient to tell doctor about leg pain. Pt also states that she is about to hit "donut hole" but will prioritize heart and eye medications when having to pay out of pocket. Counseled patient about use of nitroglycerine and replacing bottle after opening. Counseled patient to be aware of dark stool or urine while on Plavix.    Laurence Slate 11/09/2011 8:26 AM

## 2011-11-13 ENCOUNTER — Encounter (HOSPITAL_COMMUNITY)
Admission: RE | Admit: 2011-11-13 | Discharge: 2011-11-13 | Disposition: A | Discharge status: Home / Self Care | Payer: Medicare Other | Source: Ambulatory Visit | Attending: Cardiology | Admitting: Cardiology

## 2011-11-13 DIAGNOSIS — I214 Non-ST elevation (NSTEMI) myocardial infarction: Secondary | ICD-10-CM | POA: Diagnosis not present

## 2011-11-13 DIAGNOSIS — Z5189 Encounter for other specified aftercare: Secondary | ICD-10-CM | POA: Diagnosis not present

## 2011-11-13 NOTE — Progress Notes (Signed)
Pt started cardiac rehab to day.  Pt tolerated light exercise without difficulty.  SR with st depression and inverted T waves.  Continue to monitor.

## 2011-11-14 ENCOUNTER — Telehealth: Payer: Self-pay | Admitting: Internal Medicine

## 2011-11-14 ENCOUNTER — Ambulatory Visit
Admission: RE | Admit: 2011-11-14 | Discharge: 2011-11-14 | Disposition: A | Discharge status: Home / Self Care | Payer: Medicare Other | Source: Ambulatory Visit | Attending: Internal Medicine | Admitting: Internal Medicine

## 2011-11-14 DIAGNOSIS — Z1231 Encounter for screening mammogram for malignant neoplasm of breast: Secondary | ICD-10-CM

## 2011-11-14 MED ORDER — GABAPENTIN 100 MG PO CAPS: 300.0000 mg | ORAL_CAPSULE | Freq: Three times a day (TID) | ORAL | Status: DC | PRN

## 2011-11-14 NOTE — Telephone Encounter (Signed)
Rx sent spoke with pt to advise.   MW

## 2011-11-14 NOTE — Telephone Encounter (Signed)
Pt states you increased med last OV gabapentin 200mg  3x per day she is out of meds need Rx. Plz Advise      MW

## 2011-11-14 NOTE — Telephone Encounter (Signed)
Patient left msg on triage line stating she needs to speak with Dr. Frederik Pear office regarding a prescription. Call back # [phone]

## 2011-11-14 NOTE — Telephone Encounter (Signed)
Change to gabapentin 300 mg every 8 hrs prn #90

## 2011-11-15 ENCOUNTER — Encounter (HOSPITAL_COMMUNITY)
Admission: RE | Admit: 2011-11-15 | Discharge: 2011-11-15 | Disposition: A | Discharge status: Home / Self Care | Payer: Medicare Other | Source: Ambulatory Visit | Attending: Cardiology | Admitting: Cardiology

## 2011-11-15 DIAGNOSIS — Z5189 Encounter for other specified aftercare: Secondary | ICD-10-CM | POA: Insufficient documentation

## 2011-11-15 DIAGNOSIS — I214 Non-ST elevation (NSTEMI) myocardial infarction: Secondary | ICD-10-CM | POA: Insufficient documentation

## 2011-11-17 ENCOUNTER — Encounter (HOSPITAL_COMMUNITY)
Admission: RE | Admit: 2011-11-17 | Discharge: 2011-11-17 | Disposition: A | Discharge status: Home / Self Care | Payer: Medicare Other | Source: Ambulatory Visit | Attending: Cardiology | Admitting: Cardiology

## 2011-11-20 ENCOUNTER — Encounter (HOSPITAL_COMMUNITY): Payer: Medicare Other

## 2011-11-22 ENCOUNTER — Encounter (HOSPITAL_COMMUNITY)
Admission: RE | Admit: 2011-11-22 | Discharge: 2011-11-22 | Disposition: A | Discharge status: Home / Self Care | Payer: Medicare Other | Source: Ambulatory Visit | Attending: Cardiology | Admitting: Cardiology

## 2011-11-24 ENCOUNTER — Encounter (HOSPITAL_COMMUNITY)
Admission: RE | Admit: 2011-11-24 | Discharge: 2011-11-24 | Disposition: A | Discharge status: Home / Self Care | Payer: Medicare Other | Source: Ambulatory Visit | Attending: Cardiology | Admitting: Cardiology

## 2011-11-27 ENCOUNTER — Encounter (HOSPITAL_COMMUNITY)
Admission: RE | Admit: 2011-11-27 | Discharge: 2011-11-27 | Disposition: A | Discharge status: Home / Self Care | Payer: Medicare Other | Source: Ambulatory Visit | Attending: Cardiology | Admitting: Cardiology

## 2011-11-28 ENCOUNTER — Ambulatory Visit (INDEPENDENT_AMBULATORY_CARE_PROVIDER_SITE_OTHER): Payer: Medicare Other | Admitting: *Deleted

## 2011-11-28 ENCOUNTER — Ambulatory Visit (HOSPITAL_COMMUNITY): Payer: Medicare Other | Attending: Cardiovascular Disease | Admitting: Radiology

## 2011-11-28 ENCOUNTER — Other Ambulatory Visit (HOSPITAL_COMMUNITY): Payer: Self-pay | Admitting: Cardiology

## 2011-11-28 DIAGNOSIS — E785 Hyperlipidemia, unspecified: Secondary | ICD-10-CM

## 2011-11-28 DIAGNOSIS — I10 Essential (primary) hypertension: Secondary | ICD-10-CM

## 2011-11-28 DIAGNOSIS — I059 Rheumatic mitral valve disease, unspecified: Secondary | ICD-10-CM | POA: Insufficient documentation

## 2011-11-28 DIAGNOSIS — I2589 Other forms of chronic ischemic heart disease: Secondary | ICD-10-CM

## 2011-11-28 DIAGNOSIS — I2510 Atherosclerotic heart disease of native coronary artery without angina pectoris: Secondary | ICD-10-CM | POA: Insufficient documentation

## 2011-11-28 LAB — LIPID PANEL
Cholesterol: 152 mg/dL (ref 0–200)
LDL Cholesterol: 80 mg/dL (ref 0–99)
Total CHOL/HDL Ratio: 3
Triglycerides: 132 mg/dL (ref 0.0–149.0)

## 2011-11-28 LAB — HEPATIC FUNCTION PANEL
ALT: 18 U/L (ref 0–35)
AST: 25 U/L (ref 0–37)
Total Protein: 7.3 g/dL (ref 6.0–8.3)

## 2011-11-28 MED ORDER — PERFLUTREN PROTEIN A MICROSPH IV SUSP
1.0000 mL | Freq: Once | INTRAVENOUS | Status: AC
  Administered 2011-11-28: 1 mL via INTRAVENOUS

## 2011-11-28 NOTE — Progress Notes (Signed)
Echocardiogram performed. Optison given.

## 2011-11-29 ENCOUNTER — Encounter (HOSPITAL_COMMUNITY)
Admission: RE | Admit: 2011-11-29 | Discharge: 2011-11-29 | Disposition: A | Discharge status: Home / Self Care | Payer: Medicare Other | Source: Ambulatory Visit | Attending: Cardiology | Admitting: Cardiology

## 2011-11-29 ENCOUNTER — Telehealth: Payer: Self-pay | Admitting: *Deleted

## 2011-11-29 ENCOUNTER — Encounter: Payer: Self-pay | Admitting: Physician Assistant

## 2011-11-29 NOTE — Telephone Encounter (Signed)
lmom echo good, EF normal now

## 2011-11-29 NOTE — Telephone Encounter (Signed)
Message copied by Tarri Fuller on Wed Nov 29, 2011 12:06 PM ------      Message from: [place], [place] T      Created: Wed Nov 29, 2011 11:27 AM       EF now is normal.        Continue current Rx.      Keep follow up with Dr. Rollene Rotunda as planned.      Tereso Newcomer, PA-C  11:27 AM 11/29/2011

## 2011-11-30 ENCOUNTER — Encounter: Payer: Self-pay | Admitting: Cardiology

## 2011-11-30 ENCOUNTER — Ambulatory Visit (INDEPENDENT_AMBULATORY_CARE_PROVIDER_SITE_OTHER): Payer: Medicare Other | Admitting: Cardiology

## 2011-11-30 VITALS — BP 144/78 | HR 90 | Ht 65.0 in | Wt 206.4 lb

## 2011-11-30 DIAGNOSIS — I214 Non-ST elevation (NSTEMI) myocardial infarction: Secondary | ICD-10-CM

## 2011-11-30 DIAGNOSIS — I2510 Atherosclerotic heart disease of native coronary artery without angina pectoris: Secondary | ICD-10-CM | POA: Diagnosis not present

## 2011-11-30 DIAGNOSIS — I10 Essential (primary) hypertension: Secondary | ICD-10-CM | POA: Diagnosis not present

## 2011-11-30 DIAGNOSIS — E785 Hyperlipidemia, unspecified: Secondary | ICD-10-CM | POA: Diagnosis not present

## 2011-11-30 DIAGNOSIS — I2589 Other forms of chronic ischemic heart disease: Secondary | ICD-10-CM

## 2011-11-30 MED ORDER — CLOPIDOGREL BISULFATE 75 MG PO TABS: 75.0000 mg | ORAL_TABLET | Freq: Every day | ORAL | Status: DC

## 2011-11-30 MED ORDER — CARVEDILOL 3.125 MG PO TABS: 3.1250 mg | ORAL_TABLET | Freq: Two times a day (BID) | ORAL | Status: DC

## 2011-11-30 NOTE — Patient Instructions (Addendum)
Continue current medications as listed.  Follow up in 6 months with Dr Hochrein.  You will receive a letter in the mail 2 months before you are due.  Please call us when you receive this letter to schedule your follow up appointment.  

## 2011-11-30 NOTE — Progress Notes (Signed)
HPI The patient presents for followup of her cardiomyopathy and coronary disease that we are managing medically. She had a non-Q-wave myocardial infarction earlier this year. At that time her ejection fraction by echo was 30-35% though it was higher by cath. I am glad to see that her recent echo demonstrated her EF now to be 60-65%. She is participating in cardiac rehabilitation. She has a typical point tenderness it is a throbbing discomfort that is present all the time. This is not like her presenting complaints. She's not describing any new shortness of breath, PND or orthopnea. She's not describing any palpitations, presyncope or syncope. There's been some mild dizziness and some mild orthostatic symptoms.  Allergies  Allergen Reactions  . Iohexol     rash  . Iodine   . Latex   . Tape   . Imdur (Isosorbide)     Severe headaches  . Verapamil     REACTION: dizziness    Current Outpatient Prescriptions  Medication Sig Dispense Refill  . aspirin EC 81 MG EC tablet Take 1 tablet (81 mg total) by mouth daily.  30 tablet  6  . atorvastatin (LIPITOR) 80 MG tablet Take 1 tablet (80 mg total) by mouth daily at 6 PM.  30 tablet  6  . brimonidine-timolol (COMBIGAN) 0.2-0.5 % ophthalmic solution Place 1 drop into the left eye 2 (two) times daily.      . Carboxymethylcellulose Sodium (LUBRICANT EYE DROPS OP) Apply 1 drop to eye daily as needed. For dry or itchy eyes.       . carvedilol (COREG) 3.125 MG tablet Take 1 tablet (3.125 mg total) by mouth 2 (two) times daily.  60 tablet  11  . cholecalciferol (VITAMIN D) 1000 UNITS tablet Take 2,000 Units by mouth daily.      . clopidogrel (PLAVIX) 75 MG tablet Take 1 tablet (75 mg total) by mouth daily with breakfast.  30 tablet  6  . esomeprazole (NEXIUM) 40 MG capsule Take 1 capsule (40 mg total) by mouth daily before breakfast.  90 capsule  3  . estradiol (ESTRACE) 1 MG tablet Take 1 mg by mouth daily.        . fluorometholone (FML FORTE) 0.25 %  ophthalmic suspension Place 1 drop into the left eye every morning.      . gabapentin (NEURONTIN) 100 MG capsule Take 3 capsules (300 mg total) by mouth every 8 (eight) hours as needed. For pain  90 capsule  0  . loperamide (IMODIUM) 2 MG capsule Take 2 mg by mouth 4 (four) times daily as needed. For loose stool      . losartan (COZAAR) 50 MG tablet Take 50 mg by mouth. 1/2 by mouth once daily (changed in hospital)       . nitroGLYCERIN (NITROSTAT) 0.4 MG SL tablet Place 1 tablet (0.4 mg total) under the tongue every 5 (five) minutes x 3 doses as needed for chest pain.  30 tablet  6  . prednisoLONE acetate (PRED FORTE) 1 % ophthalmic suspension Place 1 drop into the right eye 4 (four) times daily.      . psyllium (METAMUCIL) 58.6 % powder Take 1 packet by mouth 3 (three) times daily as needed. For regularity      . zolpidem (AMBIEN) 10 MG tablet Take 10 mg by mouth at bedtime.          Past Medical History  Diagnosis Date  . CAD (coronary artery disease)     NSTEMI 8/13 =>  LHC 09/27/11: oLM 50%, dLM 30%, oLAD 60-70%, pLAD 50%, mLAD 70%, pD1 30%, pD2 40%, oRCA 25%, mRCA 40%, EF 50% with ant HK => LAD not amenable to PCI => med Rx  unless recurrent angina = > consider CABG  . Ischemic cardiomyopathy     a.  Echo 09/29/11: EF 30-35% with inferior, posterior, lateral AK, anterior severe HK, mild LVH, mild MR, PASP 40;  => b. follow up echo 10/13:  EF 55-60%, Gr 1 diast dysfn, mild MR  . Corneal disorder   . Postmenopausal status   . Back pain   . Dysmetabolic syndrome X   . Arthritis     osteo...right knee  . Carpal tunnel syndrome   . GERD (gastroesophageal reflux disease)   . Diverticular disease   . Hx of adenomatous colonic polyps   . Hernia     hiatal  . Anxiety state     NOS  . Hypertension     essen. NOS  . Shoulder fracture   . HLD (hyperlipidemia)     Past Surgical History  Procedure Date  . Appendectomy   . Cholecystectomy   . Abdominal hysterectomy   . Breast surgery      lumpectomy; right  . Corneal transplants     bilateral  . Colonoscopy w/ polypectomy   . Lumbar spur removed   . Total knee arthroplasty     right  . Bunionectomy   . Ankle cystectomy     right  . Rectocele surgery   . 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 Hemorrhoid surgery 12/01/2010    THD hemorrhoid ligation/pexy  . Cardiac catheterization 09/28/2011    Dr Elease Hashimoto(  to see Dr Myrtis Ser)    ROS:  As stated in the HPI and negative for all other systems.  PHYSICAL EXAM BP 144/78  Pulse 90  Ht 5\' 5"  (1.651 m)  Wt 206 lb 6.4 oz (93.622 kg)  BMI 34.35 kg/m2 GENERAL:  Well appearing HEENT:  Pupils equal round and reactive, fundi not visualized, oral mucosa unremarkable NECK:  No jugular venous distention, waveform within normal limits, carotid upstroke brisk and symmetric, no bruits, no thyromegaly LYMPHATICS:  No cervical, inguinal adenopathy LUNGS:  Clear to auscultation bilaterally CHEST:  Unremarkable HEART:  PMI not displaced or sustained,S1 and S2 within normal limits, no S3, no S4, no clicks, no rubs, no murmurs ABD:  Flat, positive bowel sounds normal in frequency in pitch, no bruits, no rebound, no guarding, no midline pulsatile mass, no hepatomegaly, no splenomegaly EXT:  2 plus pulses throughout, no edema, no cyanosis no clubbing   ASSESSMENT AND PLAN  Coronary Artery Disease:  At this point I don't see any indication for further intervention. She can continue with medical management and in particular cardiac rehabilitation.  Ischemic Cardiomyopathy:  I'm glad to see her EF is improved. No change in therapy is indicated.  I reviewed the echo results today.  Hyperlipidemia:  I reviewed her LDL which was 80 with an HDL of 45.4. This was done a couple of days ago. She will continue the meds as listed.  Hypertension: Her blood pressure had been running low. She's had her meds adjusted. Today is on the high side. I will not make any adjustments to her regimen.

## 2011-12-01 ENCOUNTER — Encounter (HOSPITAL_COMMUNITY): Payer: Medicare Other

## 2011-12-04 ENCOUNTER — Encounter (HOSPITAL_COMMUNITY)
Admission: RE | Admit: 2011-12-04 | Discharge: 2011-12-04 | Disposition: A | Discharge status: Home / Self Care | Payer: Medicare Other | Source: Ambulatory Visit | Attending: Cardiology | Admitting: Cardiology

## 2011-12-04 NOTE — Progress Notes (Signed)
Connie Owens 66 y.o. female Nutrition Note Spoke with pt.  Nutrition Plan, Nutrition Survey, and cholesterol goals reviewed with pt. Pt's 11/29/11 lipid panel reviewed with pt. Pt is following Step 1 of the Therapeutic Lifestyle Changes diet. Pt states she wants to lose wt. Pt has not actively been trying to lose wt. Per pt, "I've been bad. We've been busy and we've eaten out more." Wt loss tips reviewed.  According to pt's last A1c, pt is pre-diabetic. Pre-diabetes discussed. Pt expressed understanding of the information reviewed. Pt aware of nutrition education classes offered and plans on attending nutrition classes. Nutrition Diagnosis   Food-and nutrition-related knowledge deficit related to lack of exposure to information as related to diagnosis of: ? CVD ? Pre-DM (A1c 6.0)    Obesity related to excessive energy intake as evidenced by a BMI of 35.4  Nutrition RX/ Estimated Daily Nutrition Needs for: wt loss  1200-1700 Kcal, 30-45 gm fat, 8-13 gm sat fat, 1.2-1.7 gm trans-fat, <1500 mg sodium   Nutrition Intervention   Pt's individual nutrition plan including cholesterol goals reviewed with pt.   Benefits of adopting Therapeutic Lifestyle Changes discussed when Medficts reviewed.   Pt to attend the Portion Distortion class   Pt to attend the  ? Nutrition I class                     ? Nutrition II class   Pt given handouts for: pre-diabetes    Continue client-centered nutrition education by RD, as part of interdisciplinary care. Goal(s)   Pt to identify and limit food sources of saturated fat, trans fat, and cholesterol   Pt to identify food quantities necessary to achieve: ? wt loss to a goal wt of 184-196 lb (83.4-88.8 kg) at graduation from cardiac rehab.  Monitor and Evaluate progress toward nutrition goal with team. Nutrition Risk: Low

## 2011-12-04 NOTE — Progress Notes (Signed)
Reviewed home exercise with pt today.  Pt plans to walk and use treadmill at home for exercise.  Reviewed THR, pulse, RPE, sign and symptoms, NTG use, and when to call 911 or MD.  Pt voiced understanding. Derrick Tiegs, MA, ACSM RCEP  

## 2011-12-06 ENCOUNTER — Encounter (HOSPITAL_COMMUNITY)
Admission: RE | Admit: 2011-12-06 | Discharge: 2011-12-06 | Disposition: A | Discharge status: Home / Self Care | Payer: Medicare Other | Source: Ambulatory Visit | Attending: Cardiology | Admitting: Cardiology

## 2011-12-08 ENCOUNTER — Encounter (HOSPITAL_COMMUNITY)
Admission: RE | Admit: 2011-12-08 | Discharge: 2011-12-08 | Disposition: A | Discharge status: Home / Self Care | Payer: Medicare Other | Source: Ambulatory Visit | Attending: Cardiology | Admitting: Cardiology

## 2011-12-11 ENCOUNTER — Encounter (HOSPITAL_COMMUNITY)
Admission: RE | Admit: 2011-12-11 | Discharge: 2011-12-11 | Disposition: A | Discharge status: Home / Self Care | Payer: Medicare Other | Source: Ambulatory Visit | Attending: Cardiology | Admitting: Cardiology

## 2011-12-13 ENCOUNTER — Encounter (HOSPITAL_COMMUNITY)
Admission: RE | Admit: 2011-12-13 | Discharge: 2011-12-13 | Disposition: A | Discharge status: Home / Self Care | Payer: Medicare Other | Source: Ambulatory Visit | Attending: Cardiology | Admitting: Cardiology

## 2011-12-13 ENCOUNTER — Encounter (HOSPITAL_COMMUNITY): Admission: RE | Admit: 2011-12-13 | Payer: Medicare Other | Source: Ambulatory Visit

## 2011-12-13 NOTE — Progress Notes (Signed)
During blood pressure check  pt reported to rehab staff that she had a twinge type pain over her Right breast nipple area that extended throughout her breast inward.  Pt discomfort started on last evening. Pt did some housework and laundry prior to the discomfort.  Pt did not take any NTG or pain medication.  Pt reported that the pain was unlike the pain that brought her to the hospital for her cardiac event. No changes noted on her EKG strip and Bp 120/70.  Pt mentioned to Dr. Antoine Poche on her last visit that she felt some stabbing pain that would come and go.  Pt felt able to exercise and did proceed with no further complaint.  Telephone pt later in the afternoon.  Pt remarked that she took some pain medications and felt a little better.  Encouraged pt to contact her primary MD for follow-up.  Pt planned to call and make an appointment to be seen for the twinge and for complaints of headache.

## 2011-12-15 ENCOUNTER — Encounter (HOSPITAL_COMMUNITY)
Admission: RE | Admit: 2011-12-15 | Discharge: 2011-12-15 | Disposition: A | Discharge status: Home / Self Care | Payer: Medicare Other | Source: Ambulatory Visit | Attending: Cardiology | Admitting: Cardiology

## 2011-12-15 DIAGNOSIS — I214 Non-ST elevation (NSTEMI) myocardial infarction: Secondary | ICD-10-CM | POA: Insufficient documentation

## 2011-12-15 DIAGNOSIS — Z5189 Encounter for other specified aftercare: Secondary | ICD-10-CM | POA: Diagnosis not present

## 2011-12-18 ENCOUNTER — Ambulatory Visit (INDEPENDENT_AMBULATORY_CARE_PROVIDER_SITE_OTHER): Payer: Medicare Other | Admitting: Internal Medicine

## 2011-12-18 ENCOUNTER — Encounter (HOSPITAL_COMMUNITY)
Admission: RE | Admit: 2011-12-18 | Discharge: 2011-12-18 | Disposition: A | Discharge status: Home / Self Care | Payer: Medicare Other | Source: Ambulatory Visit | Attending: Cardiology | Admitting: Cardiology

## 2011-12-18 ENCOUNTER — Encounter: Payer: Self-pay | Admitting: Internal Medicine

## 2011-12-18 VITALS — BP 126/82 | HR 72 | Temp 97.6°F | Wt 205.8 lb

## 2011-12-18 DIAGNOSIS — R51 Headache: Secondary | ICD-10-CM | POA: Diagnosis not present

## 2011-12-18 DIAGNOSIS — M940 Chondrocostal junction syndrome [Tietze]: Secondary | ICD-10-CM | POA: Diagnosis not present

## 2011-12-18 MED ORDER — TRAMADOL HCL 50 MG PO TABS: 50.0000 mg | ORAL_TABLET | Freq: Four times a day (QID) | ORAL | Status: DC | PRN

## 2011-12-18 NOTE — Patient Instructions (Addendum)
Use an anti-inflammatory cream such as Aspercreme or Zostrix cream twice a day to the chest as needed. In lieu of this warm moist compresses or  hot water bottle can be used. Do not apply ice to the chest. Consider glucosamine sulfate 1500 mg daily for chest wall symptoms. Take this daily  for 3 months and then leave it off for 2 months. This will rehydrate the cartilages.  Please keep a diary of your headaches . Document  each occurrence on the calendar with notation of : #1 any prodrome ( any non headache symptom such as marked fatigue,visual changes, ,etc ) which precedes actual headache ; #2) severity on 1-10 scale; #3) any triggers ( food/ drink,enviromenntal or weather changes ,physical or emotional stress) in 8-12 hour period prior to the headache; & #4) response to any medications or other intervention. Please review "Headache" @ [facility] for additional information.

## 2011-12-18 NOTE — Progress Notes (Signed)
  Subjective:    Patient ID: Connie Owens, female    DOB: 06/12/1945, 66 y.o.   MRN: [id number]  HPI#1  HEADACHE : Headaches began in January of this year. They're in the left temple described as sharp centrally with a circumferential dullness. He originally were 3-4 times a day but now occur 5-6 times a day. Line in the left lateral decubitus position with pressure on temple increases headache. In August of this year gabapentin was increased without benefit  Fever: no Neck pain/stiffness: no Vision/speech/swallow/hearing difficulty: no Focal weakness/numbness: no  Altered mental status: no  Trauma: no New type of headache: no  Anticoagulant use: ASA & Plavix   #2 CHEST PAIN: She describes dull pain in the left breast since August of this year. She did have an unsteady heart attack in August. She's been evaluated for this chest pain by the cardiologist since. His question a muscle pain. Tylenol and ice do help. It has been essentially constant since October 30.  Red Flags Worse with exertion: no Recent Immobility: no  Tearing/radiation to back: no          Review of Systems Nausea/vomiting:no  Photophobia/phonophobia: no Tearing of eyes: no Sinus pain/pressure: no Diaphoresis: no Shortness of breath:no Pleuritic: no , actually better with deep inspiration Cough: no Edema: no  Orthopnea:no PND: no Dizziness:no Palpitations: no Syncope: no Indigestion: no     Objective:   Physical Exam  Gen. appearance: Well-nourished, in no distress Eyes: Extraocular motion intact, field of vision normal, vision grossly decreased OD, ptosis OD > OS;no nystagmus ENT: Canals clear, tympanic membranes normal,  hearing grossly normal Neck: Normal range of motion, no masses, normal thyroid Cardiovascular: Rate and rhythm normal; no murmurs, gallops or extra heart sounds Muscle skeletal: Range of motion, tone, &  strength normal; upper thoracic lordosis. MARKED tenderness on 2nd and 3rd  Left intercostal space. Neuro:no cranial nerve deficit, deep tendon  reflexes normal except decreased right patellar, gait normal Lymph: No cervical or axillary LA Skin: Warm and dry without suspicious lesions or rashes. Slightly tender L posterior temple Psych: no anxiety or mood change. Normally interactive and cooperative.         Assessment & Plan:  1). Costochondritis manifested as tenderness on 2nd and 3rd left intercostal space. Not pleuritic pain due to pain is improved with deep inspiration. Not GERD due to no complaints of indigestion, acid reflux, and pt is on Nexium.  Use Warm pack and Zostrix cream to relieve pain. Trial of Glucosamine  2). Headache with unknown etiology. R/O Temporal Arteritis  Plan: See orders and recommendation.

## 2011-12-19 LAB — SEDIMENTATION RATE: Sed Rate: 26 mm/hr — ABNORMAL HIGH (ref 0–22)

## 2011-12-20 ENCOUNTER — Encounter (HOSPITAL_COMMUNITY): Payer: Medicare Other

## 2011-12-22 ENCOUNTER — Encounter (HOSPITAL_COMMUNITY)
Admission: RE | Admit: 2011-12-22 | Discharge: 2011-12-22 | Disposition: A | Discharge status: Home / Self Care | Payer: Medicare Other | Source: Ambulatory Visit | Attending: Cardiology | Admitting: Cardiology

## 2011-12-25 ENCOUNTER — Encounter (HOSPITAL_COMMUNITY)
Admission: RE | Admit: 2011-12-25 | Discharge: 2011-12-25 | Disposition: A | Discharge status: Home / Self Care | Payer: Medicare Other | Source: Ambulatory Visit | Attending: Cardiology | Admitting: Cardiology

## 2011-12-27 ENCOUNTER — Encounter (HOSPITAL_COMMUNITY): Payer: Medicare Other

## 2011-12-27 ENCOUNTER — Encounter (HOSPITAL_COMMUNITY): Admission: RE | Admit: 2011-12-27 | Payer: Medicare Other | Source: Ambulatory Visit

## 2011-12-29 ENCOUNTER — Encounter (HOSPITAL_COMMUNITY)
Admission: RE | Admit: 2011-12-29 | Discharge: 2011-12-29 | Disposition: A | Discharge status: Home / Self Care | Payer: Medicare Other | Source: Ambulatory Visit | Attending: Cardiology | Admitting: Cardiology

## 2012-01-01 ENCOUNTER — Encounter (HOSPITAL_COMMUNITY)
Admission: RE | Admit: 2012-01-01 | Discharge: 2012-01-01 | Disposition: A | Discharge status: Home / Self Care | Payer: Medicare Other | Source: Ambulatory Visit | Attending: Cardiology | Admitting: Cardiology

## 2012-01-03 ENCOUNTER — Encounter (HOSPITAL_COMMUNITY)
Admission: RE | Admit: 2012-01-03 | Discharge: 2012-01-03 | Disposition: A | Discharge status: Home / Self Care | Payer: Medicare Other | Source: Ambulatory Visit | Attending: Cardiology | Admitting: Cardiology

## 2012-01-05 ENCOUNTER — Encounter (HOSPITAL_COMMUNITY): Payer: Medicare Other

## 2012-01-08 ENCOUNTER — Encounter (HOSPITAL_COMMUNITY)
Admission: RE | Admit: 2012-01-08 | Discharge: 2012-01-08 | Disposition: A | Discharge status: Home / Self Care | Payer: Medicare Other | Source: Ambulatory Visit | Attending: Cardiology | Admitting: Cardiology

## 2012-01-08 ENCOUNTER — Other Ambulatory Visit: Payer: Self-pay | Admitting: Internal Medicine

## 2012-01-08 ENCOUNTER — Telehealth: Payer: Self-pay | Admitting: Internal Medicine

## 2012-01-08 NOTE — Telephone Encounter (Signed)
Pt would like referral to neuro for her headaches. She would like to go to Dr. Maye Hides Dohmeier.

## 2012-01-08 NOTE — Telephone Encounter (Signed)
Hopp please advise  

## 2012-01-09 DIAGNOSIS — Z947 Corneal transplant status: Secondary | ICD-10-CM | POA: Diagnosis not present

## 2012-01-10 ENCOUNTER — Encounter (HOSPITAL_COMMUNITY)
Admission: RE | Admit: 2012-01-10 | Discharge: 2012-01-10 | Disposition: A | Discharge status: Home / Self Care | Payer: Medicare Other | Source: Ambulatory Visit | Attending: Cardiology | Admitting: Cardiology

## 2012-01-12 ENCOUNTER — Encounter (HOSPITAL_COMMUNITY): Payer: Medicare Other

## 2012-01-15 ENCOUNTER — Encounter (HOSPITAL_COMMUNITY)
Admission: RE | Admit: 2012-01-15 | Discharge: 2012-01-15 | Disposition: A | Discharge status: Home / Self Care | Payer: Medicare Other | Source: Ambulatory Visit | Attending: Cardiology | Admitting: Cardiology

## 2012-01-15 DIAGNOSIS — I214 Non-ST elevation (NSTEMI) myocardial infarction: Secondary | ICD-10-CM | POA: Insufficient documentation

## 2012-01-15 DIAGNOSIS — Z5189 Encounter for other specified aftercare: Secondary | ICD-10-CM | POA: Diagnosis not present

## 2012-01-17 ENCOUNTER — Encounter (HOSPITAL_COMMUNITY): Payer: Medicare Other

## 2012-01-19 ENCOUNTER — Encounter (HOSPITAL_COMMUNITY)
Admission: RE | Admit: 2012-01-19 | Discharge: 2012-01-19 | Disposition: A | Discharge status: Home / Self Care | Payer: Medicare Other | Source: Ambulatory Visit | Attending: Cardiology | Admitting: Cardiology

## 2012-01-22 ENCOUNTER — Encounter (HOSPITAL_COMMUNITY)
Admission: RE | Admit: 2012-01-22 | Discharge: 2012-01-22 | Disposition: A | Discharge status: Home / Self Care | Payer: Medicare Other | Source: Ambulatory Visit | Attending: Cardiology | Admitting: Cardiology

## 2012-01-24 ENCOUNTER — Encounter (HOSPITAL_COMMUNITY): Payer: Medicare Other

## 2012-01-26 ENCOUNTER — Encounter (HOSPITAL_COMMUNITY): Payer: Medicare Other

## 2012-01-29 ENCOUNTER — Encounter (HOSPITAL_COMMUNITY): Payer: Medicare Other

## 2012-01-31 ENCOUNTER — Encounter (HOSPITAL_COMMUNITY)
Admission: RE | Admit: 2012-01-31 | Discharge: 2012-01-31 | Disposition: A | Discharge status: Home / Self Care | Payer: Medicare Other | Source: Ambulatory Visit | Attending: Cardiology | Admitting: Cardiology

## 2012-02-02 ENCOUNTER — Encounter (HOSPITAL_COMMUNITY)
Admission: RE | Admit: 2012-02-02 | Discharge: 2012-02-02 | Disposition: A | Discharge status: Home / Self Care | Payer: Medicare Other | Source: Ambulatory Visit | Attending: Cardiology | Admitting: Cardiology

## 2012-02-05 ENCOUNTER — Encounter (HOSPITAL_COMMUNITY): Payer: Medicare Other

## 2012-02-09 ENCOUNTER — Encounter (HOSPITAL_COMMUNITY)
Admission: RE | Admit: 2012-02-09 | Discharge: 2012-02-09 | Disposition: A | Discharge status: Home / Self Care | Payer: Medicare Other | Source: Ambulatory Visit | Attending: Cardiology | Admitting: Cardiology

## 2012-02-12 ENCOUNTER — Encounter (HOSPITAL_COMMUNITY)
Admission: RE | Admit: 2012-02-12 | Discharge: 2012-02-12 | Disposition: A | Discharge status: Home / Self Care | Payer: Medicare Other | Source: Ambulatory Visit | Attending: Cardiology | Admitting: Cardiology

## 2012-02-16 ENCOUNTER — Encounter (HOSPITAL_COMMUNITY)
Admission: RE | Admit: 2012-02-16 | Discharge: 2012-02-16 | Disposition: A | Discharge status: Home / Self Care | Payer: Medicare Other | Source: Ambulatory Visit | Attending: Cardiology | Admitting: Cardiology

## 2012-02-16 DIAGNOSIS — Z5189 Encounter for other specified aftercare: Secondary | ICD-10-CM | POA: Diagnosis not present

## 2012-02-16 DIAGNOSIS — I214 Non-ST elevation (NSTEMI) myocardial infarction: Secondary | ICD-10-CM | POA: Diagnosis not present

## 2012-02-19 ENCOUNTER — Encounter (HOSPITAL_COMMUNITY): Payer: Medicare Other

## 2012-02-21 ENCOUNTER — Encounter (HOSPITAL_COMMUNITY): Payer: Medicare Other

## 2012-02-22 ENCOUNTER — Other Ambulatory Visit: Payer: Self-pay | Admitting: Internal Medicine

## 2012-02-22 NOTE — Telephone Encounter (Signed)
Patient aware to stop by the office to sign contract and once contract signed I will forward rx to mail order company for her

## 2012-02-22 NOTE — Telephone Encounter (Signed)
refill Gabapentin (Cap) 100 MG Take 3 capsules (300 mg total) by mouth every 8 (eight) hours as needed. For pain #270 no last fill listed

## 2012-02-23 ENCOUNTER — Encounter (HOSPITAL_COMMUNITY): Payer: Medicare Other

## 2012-02-23 DIAGNOSIS — Z79899 Other long term (current) drug therapy: Secondary | ICD-10-CM | POA: Diagnosis not present

## 2012-02-26 ENCOUNTER — Encounter (HOSPITAL_COMMUNITY)
Admission: RE | Admit: 2012-02-26 | Discharge: 2012-02-26 | Disposition: A | Discharge status: Home / Self Care | Payer: Medicare Other | Source: Ambulatory Visit | Attending: Cardiology | Admitting: Cardiology

## 2012-02-26 MED ORDER — GABAPENTIN 100 MG PO CAPS: 300.0000 mg | ORAL_CAPSULE | Freq: Three times a day (TID) | ORAL | Status: DC | PRN

## 2012-02-26 NOTE — Telephone Encounter (Signed)
RX sent, patient signed contract

## 2012-02-27 DIAGNOSIS — Z947 Corneal transplant status: Secondary | ICD-10-CM | POA: Diagnosis not present

## 2012-02-28 ENCOUNTER — Encounter (HOSPITAL_COMMUNITY): Payer: Medicare Other

## 2012-02-28 ENCOUNTER — Ambulatory Visit (INDEPENDENT_AMBULATORY_CARE_PROVIDER_SITE_OTHER): Payer: Medicare Other | Admitting: Internal Medicine

## 2012-02-28 VITALS — BP 124/84 | HR 74 | Temp 98.2°F | Wt 206.0 lb

## 2012-02-28 DIAGNOSIS — J069 Acute upper respiratory infection, unspecified: Secondary | ICD-10-CM | POA: Diagnosis not present

## 2012-02-28 MED ORDER — AZELASTINE HCL 0.1 % NA SOLN: 2.0000 | Freq: Every day | NASAL | Status: DC

## 2012-02-28 MED ORDER — AMOXICILLIN 500 MG PO CAPS: 1000.0000 mg | ORAL_CAPSULE | Freq: Two times a day (BID) | ORAL | Status: AC

## 2012-02-28 NOTE — Progress Notes (Signed)
  Subjective:    Patient ID: Connie Owens, female    DOB: October 12, 1945, 67 y.o.   MRN: [id number]  HPI Acute visit Not feeling well for the last 3 days: Sore throat, headache, sinus congestion, pain in the chest (tracheal area). Husband with similar symptoms, thinks she has a URI, on Robitussin.  Past Medical History  Diagnosis Date  . CAD (coronary artery disease)     NSTEMI 8/13 => LHC 09/27/11: oLM 50%, dLM 30%, oLAD 60-70%, pLAD 50%, mLAD 70%, pD1 30%, pD2 40%, oRCA 25%, mRCA 40%, EF 50% with ant HK => LAD not amenable to PCI => med Rx  unless recurrent angina = > consider CABG  . Ischemic cardiomyopathy     a.  Echo 09/29/11: EF 30-35% with inferior, posterior, lateral AK, anterior severe HK, mild LVH, mild MR, PASP 40;  => b. follow up echo 10/13:  EF 55-60%, Gr 1 diast dysfn, mild MR  . Corneal disorder   . Postmenopausal status   . Back pain   . Dysmetabolic syndrome X   . Arthritis     osteo...right knee  . Carpal tunnel syndrome   . GERD (gastroesophageal reflux disease)   . Diverticular disease   . Hx of adenomatous colonic polyps   . Hernia     hiatal  . Anxiety state     NOS  . Hypertension     essen. NOS  . Shoulder fracture   . HLD (hyperlipidemia)    Past Surgical History  Procedure Date  . Appendectomy   . Cholecystectomy   . Abdominal hysterectomy   . Breast surgery     lumpectomy; right  . Corneal transplants     bilateral  . Colonoscopy w/ polypectomy   . Lumbar spur removed   . Total knee arthroplasty     right  . Bunionectomy   . Ankle cystectomy     right  . Rectocele surgery   . 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 Hemorrhoid surgery 12/01/2010    THD hemorrhoid ligation/pexy  . Cardiac catheterization 09/28/2011    Dr Elease Hashimoto(  to see Dr Myrtis Ser)    Review of Systems Had subjective fever yesterday Cough is dry She does have postnasal dripping, dark/yellow in color. Denies nausea, vomiting, diarrhea. No myalgias Slightly more short of  breath than usual. She is a nonsmoker, no history of asthma.    Objective:   Physical Exam General -- alert, well-developed,VSS.   HEENT -- TMs normal, throat w/o redness, face symmetric and  slt tender to palpation at both maxillary areas. Lungs -- normal respiratory effort, no intercostal retractions, no accessory muscle use, and normal breath sounds.   Heart-- normal rate, regular rhythm, no murmur, and no gallop.   Neurologic-- alert & oriented X3 and strength normal in all extremities. Psych-- Cognition and judgment appear intact. Alert and cooperative with normal attention span and concentration.  not anxious appearing and not depressed appearing.         Assessment & Plan:  URI, mild tracheitis, see instructions

## 2012-02-28 NOTE — Patient Instructions (Addendum)
Rest, fluids , tylenol For cough, take Mucinex DM or robitussin DM not both (both contain guaifenesin) follow box instructions   For congestion use astelin nasal spray at night until you feel better Take the antibiotic as prescribed  (Amoxicillin) if not better in 2-3 days Call if no better in few days Call anytime if the symptoms are severe

## 2012-02-29 ENCOUNTER — Encounter: Payer: Self-pay | Admitting: Internal Medicine

## 2012-03-01 ENCOUNTER — Encounter (HOSPITAL_COMMUNITY): Payer: Medicare Other

## 2012-03-04 ENCOUNTER — Encounter (HOSPITAL_COMMUNITY): Payer: Medicare Other

## 2012-03-05 DIAGNOSIS — I10 Essential (primary) hypertension: Secondary | ICD-10-CM | POA: Diagnosis not present

## 2012-03-05 DIAGNOSIS — G43009 Migraine without aura, not intractable, without status migrainosus: Secondary | ICD-10-CM | POA: Diagnosis not present

## 2012-03-05 DIAGNOSIS — I2510 Atherosclerotic heart disease of native coronary artery without angina pectoris: Secondary | ICD-10-CM | POA: Diagnosis not present

## 2012-03-05 DIAGNOSIS — G4700 Insomnia, unspecified: Secondary | ICD-10-CM | POA: Diagnosis not present

## 2012-03-06 ENCOUNTER — Encounter (HOSPITAL_COMMUNITY)
Admission: RE | Admit: 2012-03-06 | Discharge: 2012-03-06 | Disposition: A | Discharge status: Home / Self Care | Payer: Medicare Other | Source: Ambulatory Visit | Attending: Cardiology | Admitting: Cardiology

## 2012-03-06 DIAGNOSIS — G43009 Migraine without aura, not intractable, without status migrainosus: Secondary | ICD-10-CM | POA: Diagnosis not present

## 2012-03-06 DIAGNOSIS — I2510 Atherosclerotic heart disease of native coronary artery without angina pectoris: Secondary | ICD-10-CM | POA: Diagnosis not present

## 2012-03-06 DIAGNOSIS — I10 Essential (primary) hypertension: Secondary | ICD-10-CM | POA: Diagnosis not present

## 2012-03-08 ENCOUNTER — Encounter (HOSPITAL_COMMUNITY)
Admission: RE | Admit: 2012-03-08 | Discharge: 2012-03-08 | Disposition: A | Discharge status: Home / Self Care | Payer: Medicare Other | Source: Ambulatory Visit | Attending: Cardiology | Admitting: Cardiology

## 2012-03-11 ENCOUNTER — Encounter (HOSPITAL_COMMUNITY)
Admission: RE | Admit: 2012-03-11 | Discharge: 2012-03-11 | Disposition: A | Discharge status: Home / Self Care | Payer: Medicare Other | Source: Ambulatory Visit | Attending: Cardiology | Admitting: Cardiology

## 2012-03-13 ENCOUNTER — Encounter: Payer: Self-pay | Admitting: Internal Medicine

## 2012-03-13 ENCOUNTER — Encounter (HOSPITAL_COMMUNITY)
Admission: RE | Admit: 2012-03-13 | Discharge: 2012-03-13 | Disposition: A | Discharge status: Home / Self Care | Payer: Medicare Other | Source: Ambulatory Visit | Attending: Cardiology | Admitting: Cardiology

## 2012-03-14 ENCOUNTER — Ambulatory Visit (HOSPITAL_COMMUNITY): Payer: Medicare Other

## 2012-03-15 ENCOUNTER — Encounter (HOSPITAL_COMMUNITY)
Admission: RE | Admit: 2012-03-15 | Discharge: 2012-03-15 | Disposition: A | Discharge status: Home / Self Care | Payer: Medicare Other | Source: Ambulatory Visit | Attending: Cardiology | Admitting: Cardiology

## 2012-03-15 NOTE — Progress Notes (Signed)
Pt will graduate today with the completion of 35 exercise sessions over a 18 week period.  Pt will continue home exercise with walking.

## 2012-03-19 ENCOUNTER — Ambulatory Visit (HOSPITAL_COMMUNITY)
Admission: RE | Admit: 2012-03-19 | Discharge: 2012-03-19 | Disposition: A | Discharge status: Home / Self Care | Payer: Medicare Other | Source: Ambulatory Visit | Attending: Neurology | Admitting: Neurology

## 2012-03-19 ENCOUNTER — Other Ambulatory Visit (HOSPITAL_COMMUNITY): Payer: Self-pay | Admitting: Neurology

## 2012-03-19 DIAGNOSIS — I10 Essential (primary) hypertension: Secondary | ICD-10-CM | POA: Insufficient documentation

## 2012-03-19 DIAGNOSIS — I2510 Atherosclerotic heart disease of native coronary artery without angina pectoris: Secondary | ICD-10-CM | POA: Diagnosis not present

## 2012-03-19 DIAGNOSIS — G43009 Migraine without aura, not intractable, without status migrainosus: Secondary | ICD-10-CM | POA: Insufficient documentation

## 2012-03-19 DIAGNOSIS — R51 Headache: Secondary | ICD-10-CM | POA: Diagnosis not present

## 2012-03-19 DIAGNOSIS — R0989 Other specified symptoms and signs involving the circulatory and respiratory systems: Secondary | ICD-10-CM | POA: Diagnosis not present

## 2012-03-19 NOTE — Progress Notes (Addendum)
*  PRELIMINARY RESULTS* Vascular Ultrasound Bilateral temporal artery duplex has been completed. The temporal arteries are patent with no obvious evidence of temporal arteritis bilaterally.  03/19/2012 11:51 AM Gertie Fey, RDMS, RDCS

## 2012-04-02 ENCOUNTER — Ambulatory Visit: Payer: Medicare Other | Admitting: Cardiology

## 2012-04-02 ENCOUNTER — Encounter: Payer: Self-pay | Admitting: Internal Medicine

## 2012-04-02 ENCOUNTER — Ambulatory Visit (INDEPENDENT_AMBULATORY_CARE_PROVIDER_SITE_OTHER): Payer: Medicare Other | Admitting: Internal Medicine

## 2012-04-02 VITALS — BP 126/84 | HR 54 | Temp 98.0°F

## 2012-04-02 DIAGNOSIS — M549 Dorsalgia, unspecified: Secondary | ICD-10-CM

## 2012-04-02 MED ORDER — HYDROCODONE-ACETAMINOPHEN 7.5-325 MG PO TABS: 1.0000 | ORAL_TABLET | Freq: Four times a day (QID) | ORAL | Status: DC | PRN

## 2012-04-02 NOTE — Progress Notes (Signed)
  Subjective:    Patient ID: Connie Owens, female    DOB: May 15, 1945, 67 y.o.   MRN: [id number]  HPI Acute visit One-year history of left-sided thoracic back pain, pain was on and off, for the last week it has been steady. Worse with certain movements and when she lies down on the left. Has used ice and heat without much help. The pain radiates to the left lateral chest. Some increase w/ deep breaths  Past Medical History  Diagnosis Date  . CAD (coronary artery disease)     NSTEMI 8/13 => LHC 09/27/11: oLM 50%, dLM 30%, oLAD 60-70%, pLAD 50%, mLAD 70%, pD1 30%, pD2 40%, oRCA 25%, mRCA 40%, EF 50% with ant HK => LAD not amenable to PCI => med Rx  unless recurrent angina = > consider CABG  . Ischemic cardiomyopathy     a.  Echo 09/29/11: EF 30-35% with inferior, posterior, lateral AK, anterior severe HK, mild LVH, mild MR, PASP 40;  => b. follow up echo 10/13:  EF 55-60%, Gr 1 diast dysfn, mild MR  . Corneal disorder   . Postmenopausal status   . Back pain   . Dysmetabolic syndrome X   . Arthritis     osteo...right knee  . Carpal tunnel syndrome   . GERD (gastroesophageal reflux disease)   . Diverticular disease   . Hx of adenomatous colonic polyps   . Hernia     hiatal  . Anxiety state     NOS  . Hypertension     essen. NOS  . Shoulder fracture   . HLD (hyperlipidemia)    Past Surgical History  Procedure Laterality Date  . Appendectomy    . Cholecystectomy    . Abdominal hysterectomy    . Breast surgery      lumpectomy; right  . Corneal transplants      bilateral  . Colonoscopy w/ polypectomy    . Lumbar spur removed    . Total knee arthroplasty      right  . Bunionectomy    . Ankle cystectomy      right  . Rectocele surgery    . 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 Hemorrhoid surgery  12/01/2010    THD hemorrhoid ligation/pexy  . Cardiac catheterization  09/28/2011    Dr Elease Hashimoto(  to see Dr Myrtis Ser)     Review of Systems No fever chills, does not recall any  rash in the area. She had a fall 04-2010 but no other more recent falls. No abdominal pain or cough    Objective:   Physical Exam  Musculoskeletal:       Arms:  General -- alert, well-developed  Lungs -- normal respiratory effort, no intercostal retractions, no accessory muscle use, and normal breath sounds.   Heart-- normal rate, regular rhythm, no murmur, and no gallop.   Neurologic-- alert & oriented X3 , DTRs and strength normal in all extremities. Psych-- Cognition and judgment appear intact. Alert and cooperative with normal attention span and concentration.  not anxious appearing and not depressed appearing.        Assessment & Plan:

## 2012-04-02 NOTE — Patient Instructions (Addendum)
Taking Neurontin as prescribed. Tylenol  500 mg OTC 2 tabs a day every 8 hours as needed for pain If pain severe, take hydrocodone instead, will cause drowsiness   Please get your x-ray at the other [facility]  office located at: [address] [place], across from [facility].  Please go to the basement, this is a walk-in facility, they are open from 8:30 to 5:30 PM. Phone number [phone].

## 2012-04-02 NOTE — Assessment & Plan Note (Addendum)
Thoracic back pain, radiculopathy? Neuropathy? Intolerant to tramadol, d/c from med list  Plan:  X-ray of the thoracic spine Continue Neurontin 300 mg 3 times a day ( she has not been taking that dose  Consistently) Ortho refer. Dr Juliene Pina office  vicodin

## 2012-04-03 ENCOUNTER — Ambulatory Visit (INDEPENDENT_AMBULATORY_CARE_PROVIDER_SITE_OTHER)
Admission: RE | Admit: 2012-04-03 | Discharge: 2012-04-03 | Disposition: A | Discharge status: Home / Self Care | Payer: Medicare Other | Source: Ambulatory Visit | Attending: Internal Medicine | Admitting: Internal Medicine

## 2012-04-03 DIAGNOSIS — M546 Pain in thoracic spine: Secondary | ICD-10-CM | POA: Diagnosis not present

## 2012-04-03 DIAGNOSIS — M549 Dorsalgia, unspecified: Secondary | ICD-10-CM | POA: Diagnosis not present

## 2012-04-05 ENCOUNTER — Telehealth: Payer: Self-pay | Admitting: Internal Medicine

## 2012-04-05 NOTE — Telephone Encounter (Signed)
Ok, send pt to other practice, example Dr Marissa Nestle (I believe he is orthopedics but specializes in back)

## 2012-04-05 NOTE — Telephone Encounter (Signed)
In reference to recent Orthopaedic referral for back pain, patient is currently scheduled with [facility] to see Dr. Darrelyn Hillock.  Patient is now calling, states that Dr. Drue Novel told her that [facility] doesn't specialize in the back and that Dr. Drue Novel would recommend a different physician who does.  Please advise.

## 2012-04-10 DIAGNOSIS — R0609 Other forms of dyspnea: Secondary | ICD-10-CM | POA: Diagnosis not present

## 2012-04-10 DIAGNOSIS — G501 Atypical facial pain: Secondary | ICD-10-CM | POA: Diagnosis not present

## 2012-04-12 DIAGNOSIS — M546 Pain in thoracic spine: Secondary | ICD-10-CM | POA: Diagnosis not present

## 2012-04-18 DIAGNOSIS — M47814 Spondylosis without myelopathy or radiculopathy, thoracic region: Secondary | ICD-10-CM | POA: Diagnosis not present

## 2012-04-19 ENCOUNTER — Ambulatory Visit: Payer: Medicare Other | Admitting: Cardiology

## 2012-04-23 DIAGNOSIS — M546 Pain in thoracic spine: Secondary | ICD-10-CM | POA: Diagnosis not present

## 2012-04-24 DIAGNOSIS — M546 Pain in thoracic spine: Secondary | ICD-10-CM | POA: Diagnosis not present

## 2012-05-01 DIAGNOSIS — M545 Low back pain: Secondary | ICD-10-CM | POA: Diagnosis not present

## 2012-05-01 DIAGNOSIS — M546 Pain in thoracic spine: Secondary | ICD-10-CM | POA: Diagnosis not present

## 2012-05-03 DIAGNOSIS — M545 Low back pain: Secondary | ICD-10-CM | POA: Diagnosis not present

## 2012-05-03 DIAGNOSIS — M546 Pain in thoracic spine: Secondary | ICD-10-CM | POA: Diagnosis not present

## 2012-05-06 DIAGNOSIS — M545 Low back pain: Secondary | ICD-10-CM | POA: Diagnosis not present

## 2012-05-06 DIAGNOSIS — M546 Pain in thoracic spine: Secondary | ICD-10-CM | POA: Diagnosis not present

## 2012-05-07 DIAGNOSIS — Z947 Corneal transplant status: Secondary | ICD-10-CM | POA: Diagnosis not present

## 2012-05-07 DIAGNOSIS — T86899 Unspecified complication of other transplanted tissue: Secondary | ICD-10-CM | POA: Diagnosis not present

## 2012-05-09 ENCOUNTER — Ambulatory Visit (INDEPENDENT_AMBULATORY_CARE_PROVIDER_SITE_OTHER): Payer: Medicare Other | Admitting: Cardiology

## 2012-05-09 ENCOUNTER — Encounter: Payer: Self-pay | Admitting: Cardiology

## 2012-05-09 VITALS — BP 147/72 | HR 60 | Ht 65.0 in | Wt 205.6 lb

## 2012-05-09 DIAGNOSIS — I214 Non-ST elevation (NSTEMI) myocardial infarction: Secondary | ICD-10-CM

## 2012-05-09 DIAGNOSIS — I10 Essential (primary) hypertension: Secondary | ICD-10-CM

## 2012-05-09 MED ORDER — LOSARTAN POTASSIUM 50 MG PO TABS: 50.0000 mg | ORAL_TABLET | Freq: Every day | ORAL | Status: DC

## 2012-05-09 NOTE — Patient Instructions (Addendum)
**Note De-Identified Rien Marland Obfuscation** Your physician recommends that you continue on your current medications as directed. Please refer to the Current Medication list given to you today.  Your physician wants you to follow-up in: August. You will receive a reminder letter in the mail two months in advance. If you don't receive a letter, please call our office to schedule the follow-up appointment.

## 2012-05-09 NOTE — Progress Notes (Signed)
HPI The patient presents for followup of her cardiomyopathy and coronary disease that we are managing medically. She had a non-Q-wave myocardial infarction earlier this year. At that time her ejection fraction by echo was 30-35% though it was higher by cath. Follow up echo demonstrated her EF to be 60-65%.  She has been having some back problems. She's seen a neurologist and a Careers adviser. With this discomfort she's had some radiation around under her left axillary line. This is not the same symptoms that she had her presentation last year. She's not getting any chest pressure, neck or arm discomfort. She's not getting any new shortness of breath, PND or orthopnea. She's had no palpitations, presyncope or syncope. She has had no weight gain or edema. Unfortunately she's had no weight loss. She is doing exercises though she has completed cardiac rehabilitation.  Allergies  Allergen Reactions  . Iohexol     rash  . Iodine   . Latex   . Tape   . Imdur (Isosorbide)     Severe headaches  . Verapamil     REACTION: dizziness    Current Outpatient Prescriptions  Medication Sig Dispense Refill  . aspirin EC 81 MG EC tablet Take 1 tablet (81 mg total) by mouth daily.  30 tablet  6  . brimonidine-timolol (COMBIGAN) 0.2-0.5 % ophthalmic solution Place 1 drop into the left eye 2 (two) times daily.      . Carboxymethylcellulose Sodium (LUBRICANT EYE DROPS OP) Apply 1 drop to eye daily as needed. For dry or itchy eyes.       . carvedilol (COREG) 3.125 MG tablet Take 1 tablet (3.125 mg total) by mouth 2 (two) times daily.  180 tablet  3  . cholecalciferol (VITAMIN D) 1000 UNITS tablet Take 2,000 Units by mouth daily.      . clopidogrel (PLAVIX) 75 MG tablet Take 1 tablet (75 mg total) by mouth daily with breakfast.  90 tablet  3  . cyclobenzaprine (FLEXERIL) 5 MG tablet Take 5 mg by mouth 2 (two) times daily as needed for muscle spasms.      [name] estradiol (ESTRACE) 1 MG tablet Take 1 mg by mouth daily.         [name] gabapentin (NEURONTIN) 100 MG capsule Take 3 capsules (300 mg total) by mouth every 8 (eight) hours as needed. For pain  270 capsule  0  . loperamide (IMODIUM) 2 MG capsule Take 2 mg by mouth 4 (four) times daily as needed. For loose stool      . losartan (COZAAR) 50 MG tablet Take 50 mg by mouth. 1 by mouth once daily      . nitroGLYCERIN (NITROSTAT) 0.4 MG SL tablet Place 1 tablet (0.4 mg total) under the tongue every 5 (five) minutes x 3 doses as needed for chest pain.  30 tablet  6  . omeprazole (PRILOSEC) 20 MG capsule Take 20 mg by mouth daily.      . prednisoLONE acetate (PRED FORTE) 1 % ophthalmic suspension Place 2 drops into both eyes 2 (two) times daily.       . psyllium (METAMUCIL) 58.6 % powder Take 1 packet by mouth 3 (three) times daily as needed. For regularity      . zolpidem (AMBIEN) 10 MG tablet Take 10 mg by mouth at bedtime.        [name] atorvastatin (LIPITOR) 80 MG tablet Take 1 tablet (80 mg total) by mouth daily at 6 PM.  30 tablet  6  No current facility-administered medications for this visit.    Past Medical History  Diagnosis Date  . CAD (coronary artery disease)     NSTEMI 8/13 => LHC 09/27/11: oLM 50%, dLM 30%, oLAD 60-70%, pLAD 50%, mLAD 70%, pD1 30%, pD2 40%, oRCA 25%, mRCA 40%, EF 50% with ant HK => LAD not amenable to PCI => med Rx  unless recurrent angina = > consider CABG  . Ischemic cardiomyopathy     a.  Echo 09/29/11: EF 30-35% with inferior, posterior, lateral AK, anterior severe HK, mild LVH, mild MR, PASP 40;  => b. follow up echo 10/13:  EF 55-60%, Gr 1 diast dysfn, mild MR  . Corneal disorder   . Postmenopausal status   . Back pain   . Dysmetabolic syndrome X   . Arthritis     osteo...right knee  . Carpal tunnel syndrome   . GERD (gastroesophageal reflux disease)   . Diverticular disease   . Hx of adenomatous colonic polyps   . Hernia     hiatal  . Anxiety state     NOS  . Hypertension     essen. NOS  . Shoulder fracture   . HLD  (hyperlipidemia)     Past Surgical History  Procedure Laterality Date  . Appendectomy    . Cholecystectomy    . Abdominal hysterectomy    . Breast surgery      lumpectomy; right  . Corneal transplants      bilateral  . Colonoscopy w/ polypectomy    . Lumbar spur removed    . Total knee arthroplasty      right  . Bunionectomy    . Ankle cystectomy      right  . Rectocele surgery    . 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 Hemorrhoid surgery  12/01/2010    THD hemorrhoid ligation/pexy  . Cardiac catheterization  09/28/2011    Dr Elease Hashimoto(  to see Dr Myrtis Ser)    ROS:  As stated in the HPI and negative for all other systems.  PHYSICAL EXAM BP 147/72  Pulse 60  Ht 5\' 5"  (1.651 m)  Wt 205 lb 9.6 oz (93.26 kg)  BMI 34.21 kg/m2 GENERAL:  Well appearing HEENT:  Pupils equal round and reactive, fundi not visualized, oral mucosa unremarkable NECK:  No jugular venous distention, waveform within normal limits, carotid upstroke brisk and symmetric, no bruits, no thyromegaly LUNGS:  Clear to auscultation bilaterally CHEST:  Unremarkable HEART:  PMI not displaced or sustained,S1 and S2 within normal limits, no S3, no S4, no clicks, no rubs, no murmurs ABD:  Flat, positive bowel sounds normal in frequency in pitch, no bruits, no rebound, no guarding, no midline pulsatile mass, no hepatomegaly, no splenomegaly EXT:  2 plus pulses throughout, no edema, no cyanosis no clubbing  EKG:  Sinus rhythm, rate 52, axis within normal limits, intervals within normal limits, no acute ST-T wave changes.05/09/2012   ASSESSMENT AND PLAN  Coronary Artery Disease:  At this point I don't think that the current discomfort is coming from her known coronary disease. She was reassured. No further cardiovascular testing is suggested.  Ischemic Cardiomyopathy:  Her EF improved last year with time and treatment. I would not suspect it is any lower currently. No change in therapy is indicated.  Hyperlipidemia:   I reviewed her LDL which was 80 with an HDL of 45.4 last fall.  She will continue the meds as listed.  Hypertension: Her blood  pressure had been running a little high but it fluctuates.   I will not make any adjustments to her regimen.  She needs weight loss and exercise.

## 2012-05-13 ENCOUNTER — Telehealth: Payer: Self-pay | Admitting: *Deleted

## 2012-05-13 DIAGNOSIS — M546 Pain in thoracic spine: Secondary | ICD-10-CM | POA: Diagnosis not present

## 2012-05-13 NOTE — Telephone Encounter (Signed)
CALLED AND GOT A MSG SAYING YOUR CALL CANNOT BE COMPLETED AS DIALED.  THIS IS THE THIRD TIME I HAVE RECEIVED THIS MESSAGE. -SH

## 2012-05-14 ENCOUNTER — Telehealth: Payer: Self-pay | Admitting: *Deleted

## 2012-05-14 ENCOUNTER — Other Ambulatory Visit: Payer: Self-pay | Admitting: *Deleted

## 2012-05-14 DIAGNOSIS — G4733 Obstructive sleep apnea (adult) (pediatric): Secondary | ICD-10-CM

## 2012-05-14 NOTE — Telephone Encounter (Signed)
Pt called to discuss HST results.  She had copy of report already in hand.  She scheduled CPAP Titration for Saturday 05/18/2012.  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  She also has atypical facial pain and will need to have very great care taken during mask fit to ensure her success and comfort during the night. -sh

## 2012-05-17 DIAGNOSIS — M546 Pain in thoracic spine: Secondary | ICD-10-CM | POA: Diagnosis not present

## 2012-05-21 ENCOUNTER — Ambulatory Visit (INDEPENDENT_AMBULATORY_CARE_PROVIDER_SITE_OTHER): Payer: PRIVATE HEALTH INSURANCE | Admitting: Neurology

## 2012-05-21 DIAGNOSIS — G4733 Obstructive sleep apnea (adult) (pediatric): Secondary | ICD-10-CM

## 2012-06-07 ENCOUNTER — Other Ambulatory Visit: Payer: Self-pay | Admitting: Neurology

## 2012-06-07 DIAGNOSIS — G4733 Obstructive sleep apnea (adult) (pediatric): Secondary | ICD-10-CM

## 2012-06-11 ENCOUNTER — Other Ambulatory Visit: Payer: Self-pay | Admitting: Neurology

## 2012-06-11 ENCOUNTER — Encounter: Payer: Self-pay | Admitting: Internal Medicine

## 2012-06-11 DIAGNOSIS — G4733 Obstructive sleep apnea (adult) (pediatric): Secondary | ICD-10-CM | POA: Insufficient documentation

## 2012-06-11 DIAGNOSIS — G43901 Migraine, unspecified, not intractable, with status migrainosus: Secondary | ICD-10-CM

## 2012-06-27 ENCOUNTER — Telehealth: Payer: Self-pay | Admitting: Neurology

## 2012-07-01 ENCOUNTER — Telehealth: Payer: Self-pay | Admitting: Cardiology

## 2012-07-01 DIAGNOSIS — R002 Palpitations: Secondary | ICD-10-CM

## 2012-07-01 NOTE — Telephone Encounter (Signed)
New problem     C/O palp after she eat . No chest pain.

## 2012-07-01 NOTE — Telephone Encounter (Signed)
Per pt - having palpitations that she feels in her stomach after eating that extend into the center of her chest and throat.  They last about 1 to 1 and 1/2 hours.  Pt would like to know Dr Antoine Poche wants her to do.  She has never had this before.  Advised I will discuss with MD and call her back.

## 2012-07-03 NOTE — Telephone Encounter (Signed)
If these are occurring on a daily basis we need to place a 48 hour Holter.

## 2012-07-04 NOTE — Telephone Encounter (Signed)
Pt aware of order to have monitor placed and will expect a call to schedule

## 2012-07-05 ENCOUNTER — Telehealth: Payer: Self-pay | Admitting: *Deleted

## 2012-07-05 MED ORDER — GABAPENTIN 100 MG PO CAPS: 300.0000 mg | ORAL_CAPSULE | Freq: Three times a day (TID) | ORAL | Status: DC | PRN

## 2012-07-05 NOTE — Telephone Encounter (Signed)
#  90 .Please see me before next refill

## 2012-07-05 NOTE — Telephone Encounter (Signed)
Rx sent 

## 2012-07-05 NOTE — Telephone Encounter (Signed)
Last OV 04-02-12 , last filled 02-26-12 #[id number]

## 2012-07-10 ENCOUNTER — Encounter (INDEPENDENT_AMBULATORY_CARE_PROVIDER_SITE_OTHER): Payer: Medicare Other

## 2012-07-10 ENCOUNTER — Encounter: Payer: Self-pay | Admitting: *Deleted

## 2012-07-10 DIAGNOSIS — R002 Palpitations: Secondary | ICD-10-CM | POA: Diagnosis not present

## 2012-07-10 NOTE — Progress Notes (Unsigned)
Patient ID: Connie Owens, female   DOB: September 13, 1945, 67 y.o.   MRN: [id number] Hour Holter monitor placed on patient.

## 2012-07-17 DIAGNOSIS — Z4789 Encounter for other orthopedic aftercare: Secondary | ICD-10-CM | POA: Diagnosis not present

## 2012-07-23 ENCOUNTER — Ambulatory Visit (INDEPENDENT_AMBULATORY_CARE_PROVIDER_SITE_OTHER): Payer: Medicare Other | Admitting: Neurology

## 2012-07-23 ENCOUNTER — Encounter: Payer: Self-pay | Admitting: Neurology

## 2012-07-23 VITALS — BP 127/78 | HR 56 | Temp 98.4°F | Ht 66.0 in | Wt 208.0 lb

## 2012-07-23 DIAGNOSIS — G43009 Migraine without aura, not intractable, without status migrainosus: Secondary | ICD-10-CM | POA: Insufficient documentation

## 2012-07-23 DIAGNOSIS — G44049 Chronic paroxysmal hemicrania, not intractable: Secondary | ICD-10-CM

## 2012-07-23 DIAGNOSIS — G4733 Obstructive sleep apnea (adult) (pediatric): Secondary | ICD-10-CM

## 2012-07-23 HISTORY — DX: Chronic paroxysmal hemicrania, not intractable: G44.049

## 2012-07-23 HISTORY — DX: Migraine without aura, not intractable, without status migrainosus: G43.009

## 2012-07-23 NOTE — Assessment & Plan Note (Signed)
The patient was diagnosed with sleep apnea for a home sleep test on 04/16/2012. She has a history of diastolic hypertension coronary artery disease and migraines with old or her atypical facial pain and was morbidly obese at the time. Mallampati was grade 4  The patient had a mild AHI of 7 area RDI was 9 per hour of the patient's oxygen nadir was 76% and based on her coronary artery disease history she qualified for a CPAP titration. CPAP titration took place on 4-8 2014 the patient is was titrated to 6 cm water of this at the lateral mask she had had one 30 day download and brought her machine to aid followup visit today. Download from 07/22/2012 joules an AHI of 0.2 @6  cm water pressure  With an EPR of 3 cm , average daily usage 7 hours and 14 minutes- 100% compliance.

## 2012-07-23 NOTE — Progress Notes (Addendum)
[facility]  Provider:  Dr Faizah Kandler Referring Provider: Pecola Lawless, MD Primary Care Physician:  [name], MD  Chief Complaint  Patient presents with  . Follow-up    post CPAP, rm 10    HPI:  Connie Owens is a 67 y.o. female here as a referral from Dr. Alwyn Ren for follow up on her mild sleep apnea, OSA 327.23 ,  Titrated to 5 6 cm water, 100% compliant over the last 30 days.  She was asked by [facility] care to provide data for  her CPAP. She has not noticed an positive effect on her headaches at this time and she has been using the machine now for 8 weeks. She continues with his atypical facial pain that has some migrainous component, she has a history of chronic insomnia for which the prescribed medication on the second of January 2013. Since , she has been using Ambien and was happy so far with good effect . In August 2013 the patient suffered a myocardial infarction and a cured myocardial event and she said that at the time she had also multiple other aches and pains that were dividing her attention. Dr. Davonna Belling has seen the patient just in May of this year he had ordered a undulate or he cardiac telemetry for the patient at results have not yet been available in Epic. The portable monitor her was ordered in response to the patient having severe palpitations.  I am today interested again and having the patient evaluated by Dr. Frances Furbish  or Dr. Terrace Arabia for possible Botox injection.  Due to her coronary artery disease I would not like this patient to use NSAID . Dr. Darrelyn Hillock,  her orthopedist just last week placed her on Mobic for right knee swelling and painful right knee.  I would like still for the patient to remain off any additional nonsteroidals and she agrees with the referral for a Botox evaluation and possible injection. She is to confirm with her cardiologist that this is OK. In the past I have  attempted to treat the patient with  Neurontin( gabapentin) but  it had  nor did no effect on her head pain.  And I have tried a calcium channel blocker, namely Cardizem 30 mg tablets at night ,which had also no positive effect.   Again the home sleep test and the apnea evaluation was ordered after the patient developed her headaches, facial pain, tinnitus.  Please note that jaw claudication was not observed and CRP and sedimentation rate were in normal range. The patient also had a Doppler study for a temporal arteritis., which was negative.    She takes daily  1 hour daily  naps and feels refreshed, she normally feels refreshed when waking up in the morning. Her bedtime is from midnight on up to 8 AM and there is very little variation between weekend and weekdays she sometimes has poor asleep because of joint pain and myalgia.  Headaches became a problem in January 2014- she had one fall did not injure her head but does developed a temporal trigger point on the left side of her head  area of numbness, achiness around it. Next is another arisen from the right side.   Review of Systems: Out of a complete 14 system review, the patient complains of only the following symptoms, and all other reviewed systems are negative.   History   Social History  . Marital Status: Married    Spouse Name: N/A    Number  of Children: N/A  . Years of Education: N/A   Occupational History  . retired    Social History Main Topics  . Smoking status: Never Smoker   . Smokeless tobacco: Never Used  . Alcohol Use: Yes     Comment: rare  . Drug Use: No  . Sexually Active: Not on file   Other Topics Concern  . Not on file   Social History Narrative   Daily caffeine   Regular exercise    Family History  Problem Relation Age of Onset  . Breast cancer Mother   . Heart disease Father   . Stroke Father   . Dementia Father   . Diabetes Sister     3 sisters   . Heart disease Sister     1 sister CABG  . Breast cancer Sister     PTE pre & post Dx of ca  . Diabetes  Paternal Grandfather   . Colon cancer Neg Hx   . Prostate cancer Father     Past Medical History  Diagnosis Date  . CAD (coronary artery disease)     NSTEMI 8/13 => LHC 09/27/11: oLM 50%, dLM 30%, oLAD 60-70%, pLAD 50%, mLAD 70%, pD1 30%, pD2 40%, oRCA 25%, mRCA 40%, EF 50% with ant HK => LAD not amenable to PCI => med Rx  unless recurrent angina = > consider CABG  . Ischemic cardiomyopathy     a.  Echo 09/29/11: EF 30-35% with inferior, posterior, lateral AK, anterior severe HK, mild LVH, mild MR, PASP 40;  => b. follow up echo 10/13:  EF 55-60%, Gr 1 diast dysfn, mild MR  . Corneal disorder   . Postmenopausal status   . Back pain   . Dysmetabolic syndrome X   . Arthritis     osteo...right knee  . Carpal tunnel syndrome   . GERD (gastroesophageal reflux disease)   . Diverticular disease   . Hx of adenomatous colonic polyps   . Hernia     hiatal  . Anxiety state     NOS  . Hypertension     essen. NOS  . Shoulder fracture   . HLD (hyperlipidemia)   . Migraine headache without aura 07/23/2012     Left sided  Gaylyn Rong thought to be migrainous  , hypersensitivity to touch , photophobia ,  no nausea.     Past Surgical History  Procedure Laterality Date  . Appendectomy    . Cholecystectomy    . Abdominal hysterectomy    . Breast surgery      lumpectomy; right  . Corneal transplants      bilateral  . Colonoscopy w/ polypectomy    . Lumbar spur removed    . Total knee arthroplasty      right  . Bunionectomy    . Ankle cystectomy      right  . Rectocele surgery    . 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 Hemorrhoid surgery  12/01/2010    THD hemorrhoid ligation/pexy  . Cardiac catheterization  09/28/2011    Dr Elease Hashimoto(  to see Dr Myrtis Ser)    Current Outpatient Prescriptions  Medication Sig Dispense Refill  . aspirin EC 81 MG EC tablet Take 1 tablet (81 mg total) by mouth daily.  30 tablet  6  . atorvastatin (LIPITOR) 80 MG tablet Take 1 tablet (80 mg total) by mouth daily at 6  PM.  30 tablet  6  . brimonidine-timolol (COMBIGAN) 0.2-0.5 %  ophthalmic solution Place 1 drop into the left eye 2 (two) times daily.      . Carboxymethylcellulose Sodium (LUBRICANT EYE DROPS OP) Apply 1 drop to eye daily as needed. For dry or itchy eyes.       . carvedilol (COREG) 3.125 MG tablet Take 1 tablet (3.125 mg total) by mouth 2 (two) times daily.  180 tablet  3  . cholecalciferol (VITAMIN D) 1000 UNITS tablet Take 2,000 Units by mouth daily.      . clopidogrel (PLAVIX) 75 MG tablet Take 1 tablet (75 mg total) by mouth daily with breakfast.  90 tablet  3  . cyclobenzaprine (FLEXERIL) 5 MG tablet Take 5 mg by mouth 2 (two) times daily as needed for muscle spasms.      [name] esomeprazole (NEXIUM) 20 MG capsule Take 20 mg by mouth daily.      [name] estradiol (ESTRACE) 1 MG tablet Take 1 mg by mouth daily.        [name] gabapentin (NEURONTIN) 100 MG capsule Take 3 capsules (300 mg total) by mouth every 8 (eight) hours as needed. For pain Office visit due  90 capsule  0  . loperamide (IMODIUM) 2 MG capsule Take 2 mg by mouth 4 (four) times daily as needed. For loose stool      . losartan (COZAAR) 50 MG tablet Take 1 tablet (50 mg total) by mouth daily. 1 by mouth once daily  90 tablet  1  . meloxicam (MOBIC) 15 MG tablet Take 15 mg by mouth daily.      . nitroGLYCERIN (NITROSTAT) 0.4 MG SL tablet Place 1 tablet (0.4 mg total) under the tongue every 5 (five) minutes x 3 doses as needed for chest pain.  30 tablet  6  . oxyCODONE-acetaminophen (PERCOCET) 5-325 MG per tablet Take 1 tablet by mouth daily. Every 4-6 hours as needed for pain      . prednisoLONE acetate (PRED FORTE) 1 % ophthalmic suspension Place 2 drops into both eyes 2 (two) times daily.       . psyllium (METAMUCIL) 58.6 % powder Take 1 packet by mouth 3 (three) times daily as needed. For regularity      . zolpidem (AMBIEN) 10 MG tablet Take 10 mg by mouth at bedtime.         No current facility-administered medications for this visit.     Allergies as of 07/23/2012 - Review Complete 07/23/2012  Allergen Reaction Noted  . Iohexol  08/12/2003  . Iodine  09/27/2011  . Latex  09/27/2011  . Tape  09/27/2011  . Imdur (isosorbide)  10/06/2011  . Verapamil  11/04/2008    Vitals: BP 127/78  Pulse 56  Temp(Src) 98.4 F (36.9 C) (Oral)  Ht 5\' 6"  (1.676 m)  Wt 208 lb (94.348 kg)  BMI 33.59 kg/m2 Last Weight:  Wt Readings from Last 1 Encounters:  07/23/12 208 lb (94.348 kg)   Last Height:   Ht Readings from Last 1 Encounters:  07/23/12 5\' 6"  (1.676 m)   Vision Screening:   Cornea transplant, total of 3 surgeries.  Physical exam:  General: The patient is awake, alert and appears not in acute distress. The patient is well groomed. Head: Normocephalic, atraumatic. Neck is supple. Mallampati 4 , neck circumference: 16.5 inches,   No retrognathia, no nasal obstruction.  Cardiovascular:  Regular rate and rhythm, without  murmurs or carotid bruit, and without distended neck veins. Respiratory: Lungs are clear to auscultation. Skin:  Without evidence of edema,  or rash Trunk: BMI is elevated and patient  has normal posture.  Neurologic exam : The patient is awake and alert, oriented to place and time.  Memory subjective  described as intact. There is a normal attention span & concentration ability. Speech is fluent without  dysarthria, dysphonia or aphasia. Mood and affect are appropriate.  Cranial nerves: Pupils are equal and briskly reactive to light. . Hearing to finger rub intact.  Facial sensation intact to fine touch. Facial motor strength is symmetric and tongue and uvula move midline.  Motor exam:   Normal tone and normal muscle bulk and symmetric normal strength in all extremities. Right knee pain  Limits ROM.   Sensory:  Fine touch, pinprick and vibration were tested in all extremities. Proprioception is tested in the upper extremities only.   Coordination: Rapid alternating movements in the fingers/hands is  tested and normal. Finger-to-nose maneuver tested and normal without evidence of ataxia, dysmetria or tremor.  Gait and station: Patient walks without assistive device and is able and assisted stool climb up to the exam table. Strength within normal limits. Stance is stable and normal. Tandem gait is  unfragmented.   Deep tendon reflexes: in the  upper and lower extremities are symmetric and intact. Right patella was swolllen, larger than left.    Assessment:  After physical and neurologic examination, review of laboratory studies, imaging, neurophysiology testing and pre-existing records, assessment will be reviewed on the problem list.  Plan:  Treatment plan and additional workup will be reviewed under Problem List.  This patient has a by now chronic left had pain in his hypersensitive to palpation or even fine touch on the left temple and parietal cranium. She has nor classic associated migraine symptoms does not get nauseated from pain, she does not necessarily wake up from pain or eye pain but the pain can be present in the morning upon waking up. This has not changed since her apnea has been treated for CPAP. She started CPAP treatment on 06/20/2012. I will refer the patient for a Botox evaluation, possible alternative trigger point injection this Dr. Terrace Arabia or Dr.Athar.            epworth 11 points, FSS 50 points.

## 2012-07-23 NOTE — Patient Instructions (Addendum)
Sleep Apnea Sleep apnea is disorder that affects a person's sleep. A person with sleep apnea has abnormal pauses in their breathing when they sleep. It is hard for them to get a good sleep. This makes a person tired during the day. It also can lead to other physical problems. There are three types of sleep apnea. One type is when breathing stops for a short time because your airway is blocked (obstructive sleep apnea). Another type is when the brain sometimes fails to give the normal signal to breathe to the muscles that control your breathing (central sleep apnea). The third type is a combination of the other two types. HOME CARE  Do not sleep on your back. Try to sleep on your side.  Take all medicine as told by your doctor.  Avoid alcohol, calming medicines (sedatives), and depressant drugs.  Try to lose weight if you are overweight. Talk to your doctor about a healthy weight goal. Your doctor may have you use a device that helps to open your airway. It can help you get the air that you need. It is called a positive airway pressure (PAP) device. There are three types of PAP devices:  Continuous positive airway pressure (CPAP) device.  Nasal expiratory positive airway pressure (EPAP) device.  Bilevel positive airway pressure (BPAP) device. MAKE SURE YOU:  Understand these instructions.  Will watch your condition.  Will get help right away if you are not doing well or get worse. Document Released: 11/09/2007 Document Revised: 01/17/2012 Document Reviewed: 06/03/2011 Exit[employer] Patient Information 2014 [employer]. Exercise to Lose Weight Exercise and a healthy diet may help you lose weight. Your doctor may suggest specific exercises. EXERCISE IDEAS AND TIPS  Choose low-cost things you enjoy doing, such as walking, bicycling, or exercising to workout videos.  Take stairs instead of the elevator.  Walk during your lunch break.  Park your car further away from work or  school.  Go to a gym or an exercise class.  Start with 5 to 10 minutes of exercise each day. Build up to 30 minutes of exercise 4 to 6 days a week.  Wear shoes with good support and comfortable clothes.  Stretch before and after working out.  Work out until you breathe harder and your heart beats faster.  Drink extra water when you exercise.  Do not do so much that you hurt yourself, feel dizzy, or get very short of breath. Exercises that burn about 150 calories:  Running 1  miles in 15 minutes.  Playing volleyball for 45 to 60 minutes.  Washing and waxing a car for 45 to 60 minutes.  Playing touch football for 45 minutes.  Walking 1  miles in 35 minutes.  Pushing a stroller 1  miles in 30 minutes.  Playing basketball for 30 minutes.  Raking leaves for 30 minutes.  Bicycling 5 miles in 30 minutes.  Walking 2 miles in 30 minutes.  Dancing for 30 minutes.  Shoveling snow for 15 minutes.  Swimming laps for 20 minutes.  Walking up stairs for 15 minutes.  Bicycling 4 miles in 15 minutes.  Gardening for 30 to 45 minutes.  Jumping rope for 15 minutes.  Washing windows or floors for 45 to 60 minutes. Document Released: 03/04/2010 Document Revised: 04/24/2011 Document Reviewed: 03/04/2010 [employer] Patient Information 2014 [employer].  

## 2012-07-24 ENCOUNTER — Encounter: Payer: Self-pay | Admitting: Internal Medicine

## 2012-07-24 ENCOUNTER — Encounter: Payer: Self-pay | Admitting: Neurology

## 2012-07-24 ENCOUNTER — Ambulatory Visit (INDEPENDENT_AMBULATORY_CARE_PROVIDER_SITE_OTHER): Payer: Medicare Other | Admitting: Internal Medicine

## 2012-07-24 VITALS — BP 130/88 | HR 70 | Wt 206.0 lb

## 2012-07-24 DIAGNOSIS — IMO0002 Reserved for concepts with insufficient information to code with codable children: Secondary | ICD-10-CM | POA: Diagnosis not present

## 2012-07-24 DIAGNOSIS — IMO0001 Reserved for inherently not codable concepts without codable children: Secondary | ICD-10-CM | POA: Diagnosis not present

## 2012-07-24 DIAGNOSIS — Z789 Other specified health status: Secondary | ICD-10-CM

## 2012-07-24 DIAGNOSIS — Z9189 Other specified personal risk factors, not elsewhere classified: Secondary | ICD-10-CM

## 2012-07-24 DIAGNOSIS — E785 Hyperlipidemia, unspecified: Secondary | ICD-10-CM | POA: Diagnosis not present

## 2012-07-24 DIAGNOSIS — R52 Pain, unspecified: Secondary | ICD-10-CM

## 2012-07-24 DIAGNOSIS — M792 Neuralgia and neuritis, unspecified: Secondary | ICD-10-CM

## 2012-07-24 DIAGNOSIS — D649 Anemia, unspecified: Secondary | ICD-10-CM

## 2012-07-24 MED ORDER — GABAPENTIN 100 MG PO CAPS: 100.0000 mg | ORAL_CAPSULE | Freq: Three times a day (TID) | ORAL | Status: DC | PRN

## 2012-07-24 NOTE — Progress Notes (Signed)
  Subjective:    Patient ID: Connie Owens, female    DOB: Nov 02, 1945, 66 y.o.   MRN: [id number]  HPI  Her medication list was reviewed and prescribing physician identified. I am prescribing gabapentin which she typically takes 1 in the morning and then one-2 at bedtime for diffuse, dull, aching pain from the top of her head to her feet. She describes this as being related to "nerve endings". She feels that this regimen has been effective.    Review of Systems  She had stopped her atorvastatin in April because of some muscle pain; this has been discussed with her cardiologist. At his urging she is considering resuming it.  She has been having palpitations; results of the Holter monitor are pending.     Objective:   Physical Exam Appears well-nourished & in no acute distress  No carotid bruits are present.No neck pain distention present at 10 - 15 degrees. Thyroid normal to palpation  Heart rhythm and rate are normal with no significant murmurs or gallops.  Chest is clear with no increased work of breathing  There is no evidence of aortic aneurysm or renal artery bruits  Abdomen soft with no organomegaly or masses. No HJR  No clubbing, cyanosis or edema present.  Pedal pulses are intact   No ischemic skin changes are present . Nails healthy   There is tenderness with palpation of the upper arms and thigh areas  She has no significant arthritic changes in hands.  Alert and oriented. Strength, tone, DTRs reflexes normal          Assessment & Plan:  #1See Current Assessment & Plan in Problem List under specific Diagnosis

## 2012-07-24 NOTE — Patient Instructions (Addendum)
Share results with all non [place] medical staff seen  

## 2012-07-24 NOTE — Assessment & Plan Note (Signed)
Risk of not taking statin discussed

## 2012-07-24 NOTE — Assessment & Plan Note (Addendum)
Because of her description of the pain and previous history of anemia; specifically labs will be checked to rule out neuropathy or myositis. Long term generic Cymbalta may be of benefit  Note: She has not resumed her statin as of this date.

## 2012-07-26 ENCOUNTER — Other Ambulatory Visit: Payer: Medicare Other

## 2012-07-26 DIAGNOSIS — IMO0001 Reserved for inherently not codable concepts without codable children: Secondary | ICD-10-CM | POA: Diagnosis not present

## 2012-07-26 DIAGNOSIS — Z789 Other specified health status: Secondary | ICD-10-CM | POA: Diagnosis not present

## 2012-07-26 LAB — CBC WITH DIFFERENTIAL/PLATELET
Basophils Relative: 1 % (ref 0.0–3.0)
Eosinophils Relative: 0.9 % (ref 0.0–5.0)
HCT: 40.6 % (ref 36.0–46.0)
Hemoglobin: 13.4 g/dL (ref 12.0–15.0)
Lymphs Abs: 2 10*3/uL (ref 0.7–4.0)
MCV: 91.5 fl (ref 78.0–100.0)
Monocytes Absolute: 0.6 10*3/uL (ref 0.1–1.0)
Monocytes Relative: 8.3 % (ref 3.0–12.0)
Platelets: 288 10*3/uL (ref 150.0–400.0)
RBC: 4.44 Mil/uL (ref 3.87–5.11)
WBC: 7.3 10*3/uL (ref 4.5–10.5)

## 2012-07-26 LAB — TSH: TSH: 1.93 u[IU]/mL (ref 0.35–5.50)

## 2012-07-26 LAB — VITAMIN B12: Vitamin B-12: 320 pg/mL (ref 211–911)

## 2012-07-26 LAB — SEDIMENTATION RATE: Sed Rate: 20 mm/hr (ref 0–22)

## 2012-07-31 LAB — VITAMIN D 1,25 DIHYDROXY
Vitamin D 1, 25 (OH)2 Total: 74 pg/mL — ABNORMAL HIGH (ref 18–72)
Vitamin D3 1, 25 (OH)2: 74 pg/mL

## 2012-08-05 ENCOUNTER — Telehealth: Payer: Self-pay | Admitting: Cardiology

## 2012-08-05 DIAGNOSIS — E785 Hyperlipidemia, unspecified: Secondary | ICD-10-CM

## 2012-08-05 DIAGNOSIS — I2510 Atherosclerotic heart disease of native coronary artery without angina pectoris: Secondary | ICD-10-CM

## 2012-08-05 DIAGNOSIS — I2589 Other forms of chronic ischemic heart disease: Secondary | ICD-10-CM

## 2012-08-05 MED ORDER — CARVEDILOL 6.25 MG PO TABS: 6.2500 mg | ORAL_TABLET | Freq: Two times a day (BID) | ORAL | Status: DC

## 2012-08-05 NOTE — Telephone Encounter (Signed)
New Problem  Pt is wanting to know the result of her Holter that was done May.

## 2012-08-05 NOTE — Telephone Encounter (Signed)
Pt aware monitor demonstrated NSR and SVT with a rate up to 113 bpm.  She is aware to increase her carvedilol to 6.25 mg twice a day.  She just received a 90 day supply of 3.125 mg and will take 2 tablets twice a day for a few weeks before having [place] call in the new strength.  She understands she is to f/u in appr one month.  She has an appt already in August and will keep it.  If something changes or she has problems she will call back before the appt time.

## 2012-08-13 ENCOUNTER — Telehealth: Payer: Self-pay | Admitting: Cardiology

## 2012-08-13 NOTE — Telephone Encounter (Signed)
She can switch to Pravachol 80 mg po qhs.  Dispense number 31 with 11 refills.

## 2012-08-13 NOTE — Telephone Encounter (Signed)
New Problem:    Patient called in because her atorvastatin (LIPITOR) 80 MG tablet causes her leg cramps and wanted to know if she would be able to switch to a different medication.  Please call back.

## 2012-08-13 NOTE — Telephone Encounter (Signed)
Per pt - she had been on Lipitor for her cholesterol but had stopped it d/t leg cramps/muscle spasms.  Recently restarted it 07/24/2012 and is now having them again.  (She had a normal CK the day she started back).  Wants to know if there is something else affordable she can take.  Aware I will have MD to review and call her back.

## 2012-08-14 MED ORDER — PRAVASTATIN SODIUM 80 MG PO TABS: 80.0000 mg | ORAL_TABLET | Freq: Every evening | ORAL | Status: DC

## 2012-08-14 NOTE — Telephone Encounter (Signed)
Pt aware - RX sent into [facility] at [facility] and [address]  She wants to wait to see if she tolerates it before sending it to [facility].  She has a follow up in August.

## 2012-08-28 ENCOUNTER — Ambulatory Visit (INDEPENDENT_AMBULATORY_CARE_PROVIDER_SITE_OTHER): Payer: Medicare Other | Admitting: Neurology

## 2012-08-28 ENCOUNTER — Encounter: Payer: Self-pay | Admitting: Neurology

## 2012-08-28 VITALS — BP 122/75 | HR 67 | Ht 65.0 in | Wt 207.0 lb

## 2012-08-28 DIAGNOSIS — G43719 Chronic migraine without aura, intractable, without status migrainosus: Secondary | ICD-10-CM | POA: Diagnosis not present

## 2012-08-28 DIAGNOSIS — E785 Hyperlipidemia, unspecified: Secondary | ICD-10-CM

## 2012-08-28 DIAGNOSIS — I10 Essential (primary) hypertension: Secondary | ICD-10-CM

## 2012-08-28 DIAGNOSIS — G43009 Migraine without aura, not intractable, without status migrainosus: Secondary | ICD-10-CM

## 2012-08-28 MED ORDER — ONABOTULINUMTOXINA 100 UNITS IJ SOLR: 150.0000 [IU] | Freq: Once | INTRAMUSCULAR | Status: DC

## 2012-08-28 NOTE — Progress Notes (Signed)
History of present illness:    Connie Owens is a 67 y.o. female here as a referral from Dr. Vickey Huger for evaluation of chronic headaches.  She has a past medical history of of sharpish sleep apnea, using CPAP machine, also has past medical history of coronary artery disease, corneal disorder, status post bilateral corneal transplant, anxiety, hypertension, hyperlipidemia, migraine headaches  She presented with chronic headaches  since January 2013, she described almost constant daily left temporal frontal area pressure headaches, throbbing pain, intermixed with sharp piercing pain,  lasting for a few seconds, 8 out of 10, she denies visual loss, she denies light and noise sensitivity,  She has tried Cardizem calcium channel blocker, no change in her headache, she is also taking gabapentin 100 mg 3 times a day, with no significant improvement,     She is taking Tylenol, occasionally oxycodone, aspirin, without significant help either.atient having severe palpitations.  CRP and sedimentation rate were in normal range. The patient also had a Doppler study for a temporal arteritis., which was negative.  Review of Systems: Out of a complete 14 system review, the patient complains of only the following symptoms, and all other reviewed systems are negative.    Social History  . Marital Status: Married    Spouse Name: N/A    Number of Children: N/A  . Years of Education: N/A   Occupational History  . retired    Social History Main Topics  . Smoking status: Never Smoker   . Smokeless tobacco: Never Used  . Alcohol Use: Yes     Comment: rare  . Drug Use: No  . Sexually Active: Not on file   Other Topics Concern  . Not on file   Social History Narrative   Daily caffeine   Regular exercise    Family History  Problem Relation Age of Onset  . Breast cancer Mother   . Heart disease Father   . Stroke Father   . Dementia Father   . Diabetes Sister     3 sisters   . Heart  disease Sister     1 sister CABG  . Breast cancer Sister     PTE pre & post Dx of ca  . Diabetes Paternal Grandfather   . Colon cancer Neg Hx   . Prostate cancer Father     Past Medical History  Diagnosis Date  . CAD (coronary artery disease)   . Corneal disorder   . Postmenopausal status   . Back pain   . Dysmetabolic syndrome X   . Arthritis     osteo...right knee  . Carpal tunnel syndrome   . GERD (gastroesophageal reflux disease)   . Diverticular disease   . Hx of adenomatous colonic polyps   . Hernia   . Anxiety state   . Hypertension   . Shoulder fracture   . HLD (hyperlipidemia)   . Migraine headache without aura 07/23/2012  . Headache, paroxysmal hemicrania, chronic 07/23/2012    Past Surgical History  Procedure Laterality Date  . Appendectomy    . Cholecystectomy    . Abdominal hysterectomy    . Breast surgery      lumpectomy; right  . Corneal transplants      bilateral  . Colonoscopy w/ polypectomy    . Lumbar spur removed    . Total knee arthroplasty      right  . Bunionectomy    . Ankle cystectomy      right  . Rectocele surgery    .  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 Hemorrhoid surgery  12/01/2010    THD hemorrhoid ligation/pexy  . Cardiac catheterization  09/28/2011    Dr Elease Hashimoto(  to see Dr Myrtis Ser)    Current Outpatient Prescriptions  Medication Sig Dispense Refill  . aspirin EC 81 MG EC tablet Take 1 tablet (81 mg total) by mouth daily.  30 tablet  6  . brimonidine-timolol (COMBIGAN) 0.2-0.5 % ophthalmic solution Place 1 drop into the left eye 2 (two) times daily.      . Carboxymethylcellulose Sodium (LUBRICANT EYE DROPS OP) Apply 1 drop to eye daily as needed. For dry or itchy eyes.       . carvedilol (COREG) 6.25 MG tablet Take 1 tablet (6.25 mg total) by mouth 2 (two) times daily.  180 tablet  3  . cholecalciferol (VITAMIN D) 1000 UNITS tablet Take 2,000 Units by mouth daily.      . clopidogrel (PLAVIX) 75 MG tablet Take 1 tablet (75  mg total) by mouth daily with breakfast.  90 tablet  3  . esomeprazole (NEXIUM) 40 MG capsule Take 40 mg by mouth daily before breakfast.      . estradiol (ESTRACE) 1 MG tablet Take 1 mg by mouth daily.        [name] gabapentin (NEURONTIN) 100 MG capsule Take 1 capsule (100 mg total) by mouth every 8 (eight) hours as needed. For pain Office visit due  270 capsule  1  . HYDROcodone-acetaminophen (NORCO) 7.5-325 MG per tablet Take 1 tablet by mouth every 6 (six) hours as needed for pain (Knee Pain).      [name] loperamide (IMODIUM) 2 MG capsule Take 2 mg by mouth 4 (four) times daily as needed. For loose stool      . losartan (COZAAR) 50 MG tablet Take 1 tablet (50 mg total) by mouth daily. 1 by mouth once daily  90 tablet  1  . meloxicam (MOBIC) 15 MG tablet Take 15 mg by mouth daily.      . nitroGLYCERIN (NITROSTAT) 0.4 MG SL tablet Place 1 tablet (0.4 mg total) under the tongue every 5 (five) minutes x 3 doses as needed for chest pain.  30 tablet  6  . pravastatin (PRAVACHOL) 80 MG tablet Take 1 tablet (80 mg total) by mouth every evening.  30 tablet  3  . prednisoLONE acetate (PRED FORTE) 1 % ophthalmic suspension Place 1 drop into both eyes 2 (two) times daily.       . psyllium (METAMUCIL) 58.6 % powder Take 1 packet by mouth 3 (three) times daily as needed. For regularity      . zolpidem (AMBIEN) 10 MG tablet Take 10 mg by mouth at bedtime.         No current facility-administered medications for this visit.    Allergies as of 08/28/2012 - Review Complete 08/28/2012  Allergen Reaction Noted  . Iohexol  08/12/2003  . Iodine  09/27/2011  . Latex  09/27/2011  . Tape  09/27/2011  . Imdur (isosorbide)  10/06/2011  . Verapamil  11/04/2008    Vitals: BP 122/75  Pulse 67  Ht 5\' 5"  (1.651 m)  Wt 207 lb (93.895 kg)  BMI 34.45 kg/m2 Last Weight:  Wt Readings from Last 1 Encounters:  08/28/12 207 lb (93.895 kg)   Last Height:   Ht Readings from Last 1 Encounters:  08/28/12 5\' 5"  (1.651 m)    Vision Screening:   Cornea transplant, total of 3 surgeries.  Physical exam:  General: The patient is awake, alert and appears not in acute distress. The patient is well groomed. Head: Normocephalic, atraumatic.  Neck is supple.  Cardiovascular:  Regular rate and rhythm, without  murmurs or carotid bruit, and without distended neck veins. Respiratory: Lungs are clear to auscultation. Skin:  Without evidence of edema, or rash Trunk: BMI is elevated and patient  has normal posture.  Neurologic exam : The patient is awake and alert, oriented to place and time.   Speech is fluent without  dysarthria, dysphonia or aphasia.   Cranial nerves: Pupils are equal and briskly reactive to light. S/p bilateral corneal implantation.  Hearing to finger rub intact.  Facial sensation intact to fine touch. Facial motor strength is symmetric and tongue and uvula move midline.  Motor exam:   Normal tone and normal muscle bulk and symmetric normal strength in all extremities.   Sensory:  Intact to light touch pin prick.  Coordination: Rapid alternating movements in the fingers/hands is tested and normal. Finger-to-nose maneuver tested and normal without evidence of ataxia, dysmetria or tremor.  Gait and station: Patient walks without assistive device and is able and assisted stool climb up to the exam table. Strength within normal limits. Stance is stable and normal. Tandem gait is  unfragmented.   Deep tendon reflexes: normal and symmetric  Assessment and Plan:  66 years old right-handed Caucasian female, with chronic headaches since January 2013, failed preventive medication Neurontin, calcium blocker,  BOTOX injection was performed according to protocol by [facility]. 100 units of BOTOX was dissolved into 2 cc NS. (Lot [id number], exp Jan 2017).  Total of 150 units,   Corrugator 2 sites, 10 units Procerus 1 site, 5 unit Frontalis 4 sites,  20 units, Temporalis 8 sites,  40 units  Occipitalis 6  sites, 30 units Cervical Paraspinal, 4 sites, 20 units Trapezius, 6 sites, 30 units  Patient tolerate the injection well. Will return for repeat injection in 3 months.

## 2012-09-11 ENCOUNTER — Other Ambulatory Visit: Payer: Self-pay | Admitting: Obstetrics and Gynecology

## 2012-09-11 DIAGNOSIS — Z124 Encounter for screening for malignant neoplasm of cervix: Secondary | ICD-10-CM | POA: Diagnosis not present

## 2012-09-11 DIAGNOSIS — Z01419 Encounter for gynecological examination (general) (routine) without abnormal findings: Secondary | ICD-10-CM | POA: Diagnosis not present

## 2012-09-27 ENCOUNTER — Ambulatory Visit (INDEPENDENT_AMBULATORY_CARE_PROVIDER_SITE_OTHER): Payer: Medicare Other | Admitting: Cardiology

## 2012-09-27 ENCOUNTER — Encounter: Payer: Self-pay | Admitting: Cardiology

## 2012-09-27 VITALS — BP 128/88 | HR 63 | Ht 65.0 in | Wt 214.4 lb

## 2012-09-27 DIAGNOSIS — I214 Non-ST elevation (NSTEMI) myocardial infarction: Secondary | ICD-10-CM

## 2012-09-27 DIAGNOSIS — I10 Essential (primary) hypertension: Secondary | ICD-10-CM | POA: Diagnosis not present

## 2012-09-27 MED ORDER — CARVEDILOL 6.25 MG PO TABS: 6.2500 mg | ORAL_TABLET | Freq: Two times a day (BID) | ORAL | Status: DC

## 2012-09-27 MED ORDER — CARVEDILOL 3.125 MG PO TABS: 3.1250 mg | ORAL_TABLET | Freq: Two times a day (BID) | ORAL | Status: DC

## 2012-09-27 MED ORDER — LOSARTAN POTASSIUM 50 MG PO TABS: 50.0000 mg | ORAL_TABLET | Freq: Every day | ORAL | Status: DC

## 2012-09-27 MED ORDER — PRAVASTATIN SODIUM 80 MG PO TABS: 80.0000 mg | ORAL_TABLET | Freq: Every evening | ORAL | Status: DC

## 2012-09-27 MED ORDER — CLOPIDOGREL BISULFATE 75 MG PO TABS: 75.0000 mg | ORAL_TABLET | Freq: Every day | ORAL | Status: DC

## 2012-09-27 NOTE — Patient Instructions (Addendum)
Please increase your Carvedilol by 3.125 mg twice a day for a total of 9.375 mg twice a day. Continue all other medications as listed.  Follow up in 6 months with Dr Antoine Poche.  You will receive a letter in the mail 2 months before you are due.  Please call us when you receive this letter to schedule your follow up appointment.

## 2012-09-27 NOTE — Progress Notes (Signed)
HPI The patient presents for followup of her cardiomyopathy and coronary disease that we are managing medically. She had a non-Q-wave myocardial infarction earlier this year. At that time her ejection fraction by echo was 30-35% though it was higher by cath. Follow up echo demonstrated her EF to be 60-65%.  Since I last saw her she has had continued occasional palpitations. These seem to happen after eating. I did off on her beta blocker but she didn't have a significant improvement. She's not having any presyncope or syncope. She does rarely have some chest discomfort and back discomfort but this is different than her previous angina.  Allergies  Allergen Reactions  . Iohexol     rash  . Iodine   . Latex   . Tape   . Imdur [Isosorbide]     Severe headaches  . Verapamil     REACTION: dizziness    Current Outpatient Prescriptions  Medication Sig Dispense Refill  . aspirin EC 81 MG EC tablet Take 1 tablet (81 mg total) by mouth daily.  30 tablet  6  . brimonidine-timolol (COMBIGAN) 0.2-0.5 % ophthalmic solution Place 1 drop into the left eye 2 (two) times daily.      . Carboxymethylcellulose Sodium (LUBRICANT EYE DROPS OP) Apply 1 drop to eye daily as needed. For dry or itchy eyes.       . carvedilol (COREG) 6.25 MG tablet Take 12.5 mg by mouth 2 (two) times daily.      . cholecalciferol (VITAMIN D) 1000 UNITS tablet Take 2,000 Units by mouth daily.      . clopidogrel (PLAVIX) 75 MG tablet Take 1 tablet (75 mg total) by mouth daily with breakfast.  90 tablet  3  . esomeprazole (NEXIUM) 40 MG capsule Take 40 mg by mouth daily before breakfast.      . estradiol (ESTRACE) 1 MG tablet Take 1 mg by mouth daily.        [name] gabapentin (NEURONTIN) 100 MG capsule Take 1 capsule (100 mg total) by mouth every 8 (eight) hours as needed. For pain Office visit due  270 capsule  1  . loperamide (IMODIUM) 2 MG capsule Take 2 mg by mouth 4 (four) times daily as needed. For loose stool      . losartan  (COZAAR) 50 MG tablet Take 1 tablet (50 mg total) by mouth daily. 1 by mouth once daily  90 tablet  1  . meloxicam (MOBIC) 15 MG tablet Take 15 mg by mouth daily.      . Methylcellulose (MUROCEL OP) Apply 5 % to eye. Ointment for right eye at night as needed      . nitroGLYCERIN (NITROSTAT) 0.4 MG SL tablet Place 1 tablet (0.4 mg total) under the tongue every 5 (five) minutes x 3 doses as needed for chest pain.  30 tablet  6  . pravastatin (PRAVACHOL) 80 MG tablet Take 1 tablet (80 mg total) by mouth every evening.  30 tablet  3  . prednisoLONE acetate (PRED FORTE) 1 % ophthalmic suspension Place 1 drop into both eyes 2 (two) times daily.       . psyllium (METAMUCIL) 58.6 % powder Take 1 packet by mouth 3 (three) times daily as needed. For regularity      . zolpidem (AMBIEN) 10 MG tablet Take 10 mg by mouth at bedtime.         Current Facility-Administered Medications  Medication Dose Route Frequency Provider Last Rate Last Dose  . botulinum toxin  Type A (BOTOX) injection 150 Units  150 Units Intramuscular Once Levert Feinstein, MD        Past Medical History  Diagnosis Date  . CAD (coronary artery disease)     NSTEMI 8/13 => LHC 09/27/11: oLM 50%, dLM 30%, oLAD 60-70%, pLAD 50%, mLAD 70%, pD1 30%, pD2 40%, oRCA 25%, mRCA 40%, EF 50% with ant HK => LAD not amenable to PCI => med Rx  unless recurrent angina = > consider CABG  . Ischemic cardiomyopathy     a.  Echo 09/29/11: EF 30-35% with inferior, posterior, lateral AK, anterior severe HK, mild LVH, mild MR, PASP 40;  => b. follow up echo 10/13:  EF 55-60%, Gr 1 diast dysfn, mild MR  . Corneal disorder   . Postmenopausal status   . Back pain   . Dysmetabolic syndrome X   . Arthritis     osteo...right knee  . Carpal tunnel syndrome   . GERD (gastroesophageal reflux disease)   . Diverticular disease   . Hx of adenomatous colonic polyps   . Hernia     hiatal  . Anxiety state     NOS  . Hypertension     essen. NOS  . Shoulder fracture   . HLD  (hyperlipidemia)   . Migraine headache without aura 07/23/2012     Left sided  Connie Owens thought to be migrainous  , hypersensitivity to touch , photophobia ,  no nausea.   . Headache, paroxysmal hemicrania, chronic 07/23/2012     Referral to BOTOX evaluation with dr Frances Furbish or Terrace Arabia .    Past Surgical History  Procedure Laterality Date  . Appendectomy    . Cholecystectomy    . Abdominal hysterectomy    . Breast surgery      lumpectomy; right  . Corneal transplants      bilateral  . Colonoscopy w/ polypectomy    . Lumbar spur removed    . Total knee arthroplasty      right  . Bunionectomy    . Ankle cystectomy      right  . Rectocele surgery    . 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 Hemorrhoid surgery  12/01/2010    THD hemorrhoid ligation/pexy  . Cardiac catheterization  09/28/2011    Dr Elease Hashimoto(  to see Dr Myrtis Ser)    ROS:  As stated in the HPI and negative for all other systems.  PHYSICAL EXAM BP 128/88  Pulse 63  Ht 5\' 5"  (1.651 m)  Wt 214 lb 6.4 oz (97.251 kg)  BMI 35.68 kg/m2 GENERAL:  Well appearing HEENT:  Pupils equal round and reactive, fundi not visualized, oral mucosa unremarkable NECK:  No jugular venous distention, waveform within normal limits, carotid upstroke brisk and symmetric, no bruits, no thyromegaly LUNGS:  Clear to auscultation bilaterally CHEST:  Unremarkable HEART:  PMI not displaced or sustained,S1 and S2 within normal limits, no S3, no S4, no clicks, no rubs, no murmurs ABD:  Flat, positive bowel sounds normal in frequency in pitch, no bruits, no rebound, no guarding, no midline pulsatile mass, no hepatomegaly, no splenomegaly EXT:  2 plus pulses throughout, no edema, no cyanosis no clubbing  EKG:  Sinus rhythm, rate 63, axis within normal limits, intervals within normal limits, no acute ST-T wave changes.09/27/2012   ASSESSMENT AND PLAN  Coronary Artery Disease:  The patient has no new sypmtoms.  No further cardiovascular testing is indicated.  We  will continue with aggressive risk  reduction and meds as listed.  Ischemic Cardiomyopathy:  She has residual mildly reduced ejection fraction. No change in therapy is indicated.  Hyperlipidemia:  She tolerates the current dose of statin and has had a good LDL level. I will leave her on the meds as listed.  Hypertension: Her blood pressure is controlled. I will however be changing meds as listed.  Palpitations: These are still slightly problematic.  I'm going to try to creep up on her beta blocker

## 2012-10-01 DIAGNOSIS — Z1211 Encounter for screening for malignant neoplasm of colon: Secondary | ICD-10-CM | POA: Diagnosis not present

## 2012-10-11 ENCOUNTER — Other Ambulatory Visit: Payer: Self-pay

## 2012-10-11 MED ORDER — ZOLPIDEM TARTRATE 10 MG PO TABS: 10.0000 mg | ORAL_TABLET | Freq: Every day | ORAL | Status: DC

## 2012-10-11 NOTE — Telephone Encounter (Signed)
Dr Vickey Huger is out of the office.  Forwarding request to Dr Eulah Citizen

## 2012-10-15 NOTE — Telephone Encounter (Signed)
Rx Signed and faxed

## 2012-11-01 ENCOUNTER — Encounter: Payer: Self-pay | Admitting: Neurology

## 2012-11-05 ENCOUNTER — Ambulatory Visit (INDEPENDENT_AMBULATORY_CARE_PROVIDER_SITE_OTHER): Payer: Medicare Other | Admitting: Gastroenterology

## 2012-11-05 ENCOUNTER — Telehealth: Payer: Self-pay | Admitting: *Deleted

## 2012-11-05 ENCOUNTER — Encounter: Payer: Self-pay | Admitting: Gastroenterology

## 2012-11-05 VITALS — BP 152/98 | HR 60 | Ht 63.5 in | Wt 215.5 lb

## 2012-11-05 DIAGNOSIS — K648 Other hemorrhoids: Secondary | ICD-10-CM | POA: Diagnosis not present

## 2012-11-05 DIAGNOSIS — R1012 Left upper quadrant pain: Secondary | ICD-10-CM | POA: Diagnosis not present

## 2012-11-05 DIAGNOSIS — K6289 Other specified diseases of anus and rectum: Secondary | ICD-10-CM

## 2012-11-05 DIAGNOSIS — R195 Other fecal abnormalities: Secondary | ICD-10-CM | POA: Diagnosis not present

## 2012-11-05 DIAGNOSIS — R197 Diarrhea, unspecified: Secondary | ICD-10-CM

## 2012-11-05 MED ORDER — HYDROCORTISONE ACETATE 25 MG RE SUPP: 25.0000 mg | Freq: Two times a day (BID) | RECTAL | Status: DC

## 2012-11-05 NOTE — Patient Instructions (Addendum)
Your Banding is scheduled on 11/14/2012 at 2:15pm  We have discussed the banding procedure in the office Go to the basement today for your hemoccult kit We are sending in a prescription to your pharmacy We will get with your prescriber of your Plavix

## 2012-11-05 NOTE — Assessment & Plan Note (Addendum)
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  I think it is unlikely that she is symptomatic solely  from skin tags.  Recommendations #1 Anusol HC suppositories #2 to consider band ligation

## 2012-11-05 NOTE — Telephone Encounter (Signed)
Dr Antoine Poche  We need to know if this patient can come off plavix for a hemorrhoidal banding that is on 11/14/2012  Thank you

## 2012-11-05 NOTE — Assessment & Plan Note (Addendum)
Heme positive stools could be do to hemorrhoidal disease.  She's had no further overt GI bleeding including rectal bleeding.  Hemoglobin stable.  Last colonoscopy was 2011, endoscopy in 2008.  Medications #1 followup Hemoccults; if positive I would consider upper endoscopy as an initial test.

## 2012-11-05 NOTE — Assessment & Plan Note (Signed)
Chronic, do to IBS

## 2012-11-05 NOTE — Progress Notes (Signed)
History of Present Illness: Mrs. Connie Owens has returned for evaluation of Hemoccult-positive stools.  This was noted on routine testing by her gynecologist.  GI complaints include chronic diarrhea for which she's undergone extensive workup in the past.  This is fairly well-controlled with Imodium.  She has chronic rectal discomfort.  She underwent a THD hemorrhoid ligation/Nexium in October, 2012 with only minimal improvement.  Sigmoidoscopy with biopsy in December, 2011 was negative.  Biopsies were unrevealing.  Colonoscopy in June, 2011 demonstrated a small tubular adenoma.  Endoscopy in 2008 was normal except for a hiatal hernia.  Hemoglobin in June, 2014 was 13.4.  She denies rectal bleeding.  She's on no regular gastric irritants including nonsteroidals.  She denies dysphagia.  She has occasional pyrosis.  She is complaining of left upper quadrant pain in the area of the ribs.  She is on Plavix.    Past Medical History  Diagnosis Date  . CAD (coronary artery disease)     NSTEMI 8/13 => LHC 09/27/11: oLM 50%, dLM 30%, oLAD 60-70%, pLAD 50%, mLAD 70%, pD1 30%, pD2 40%, oRCA 25%, mRCA 40%, EF 50% with ant HK => LAD not amenable to PCI => med Rx  unless recurrent angina = > consider CABG  . Ischemic cardiomyopathy     a.  Echo 09/29/11: EF 30-35% with inferior, posterior, lateral AK, anterior severe HK, mild LVH, mild MR, PASP 40;  => b. follow up echo 10/13:  EF 55-60%, Gr 1 diast dysfn, mild MR  . Corneal disorder   . Postmenopausal status   . Back pain   . Dysmetabolic syndrome X   . Arthritis     osteo...right knee  . Carpal tunnel syndrome   . GERD (gastroesophageal reflux disease)   . Diverticular disease   . Hx of adenomatous colonic polyps   . Hernia     hiatal  . Anxiety state     NOS  . Hypertension     essen. NOS  . Shoulder fracture   . HLD (hyperlipidemia)   . Migraine headache without aura 07/23/2012     Left sided  Gaylyn Rong thought to be migrainous  , hypersensitivity to touch ,  photophobia ,  no nausea.   . Headache, paroxysmal hemicrania, chronic 07/23/2012     Referral to BOTOX evaluation with dr Frances Furbish or Terrace Arabia .  [name] Heart attack 09/2011   Past Surgical History  Procedure Laterality Date  . Appendectomy    . Cholecystectomy    . Abdominal hysterectomy    . Breast lumpectomy Right 1996  . Colonoscopy w/ polypectomy    . Lumbar spur removed    . Total knee arthroplasty Right   . Bunionectomy Right   . Ankle cystectomy Right   . Rectocele surgery    . 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 Hemorrhoid surgery  12/01/2010    THD hemorrhoid ligation/pexy  . Cardiac catheterization  09/28/2011    Dr Elease Hashimoto(  to see Dr Myrtis Ser)  . 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 Corneal transplant Left     x1   family history includes Breast cancer in her mother and sister; Dementia in her father; Diabetes in her paternal grandfather and sister; Heart disease in her father and sister; Prostate cancer in her father; Stroke in her father. There is no history of Colon cancer. Current Outpatient Prescriptions  Medication Sig Dispense Refill  . brimonidine-timolol (COMBIGAN) 0.2-0.5 % ophthalmic solution Place 1 drop into the left eye  2 (two) times daily.      . Carboxymethylcellulose Sodium (LUBRICANT EYE DROPS OP) Apply 1 drop to eye daily as needed. For dry or itchy eyes.       . carvedilol (COREG) 3.125 MG tablet Take 1 tablet (3.125 mg total) by mouth 2 (two) times daily.  180 tablet  3  . carvedilol (COREG) 6.25 MG tablet Take 1 tablet (6.25 mg total) by mouth 2 (two) times daily.  180 tablet  3  . cholecalciferol (VITAMIN D) 1000 UNITS tablet Take 2,000 Units by mouth daily.      . clopidogrel (PLAVIX) 75 MG tablet Take 1 tablet (75 mg total) by mouth daily with breakfast.  90 tablet  3  . esomeprazole (NEXIUM) 40 MG capsule Take 40 mg by mouth daily before breakfast.      . estradiol (ESTRACE) 1 MG tablet Take 1 mg by mouth daily.        [name] gabapentin (NEURONTIN) 100 MG  capsule Take 1 capsule (100 mg total) by mouth every 8 (eight) hours as needed. For pain Office visit due  270 capsule  1  . loperamide (IMODIUM) 2 MG capsule Take 2 mg by mouth 4 (four) times daily as needed. For loose stool      . losartan (COZAAR) 50 MG tablet Take 1 tablet (50 mg total) by mouth daily. 1 by mouth once daily  90 tablet  3  . meloxicam (MOBIC) 15 MG tablet Take 15 mg by mouth daily.      . Methylcellulose (MUROCEL OP) Apply 5 % to eye. Ointment for right eye at night as needed      . pravastatin (PRAVACHOL) 80 MG tablet Take 1 tablet (80 mg total) by mouth every evening.  90 tablet  3  . prednisoLONE acetate (PRED FORTE) 1 % ophthalmic suspension Place 1 drop into both eyes 2 (two) times daily.       . psyllium (METAMUCIL) 58.6 % powder Take 1 packet by mouth 3 (three) times daily as needed. For regularity      . zolpidem (AMBIEN) 10 MG tablet Take 1 tablet (10 mg total) by mouth at bedtime.  90 tablet  1  . nitroGLYCERIN (NITROSTAT) 0.4 MG SL tablet Place 1 tablet (0.4 mg total) under the tongue every 5 (five) minutes x 3 doses as needed for chest pain.  30 tablet  6   Current Facility-Administered Medications  Medication Dose Route Frequency Provider Last Rate Last Dose  . botulinum toxin Type A (BOTOX) injection 150 Units  150 Units Intramuscular Once Levert Feinstein, MD       Allergies as of 11/05/2012 - Review Complete 11/05/2012  Allergen Reaction Noted  . Iohexol  08/12/2003  . Iodine  09/27/2011  . Latex  09/27/2011  . Tape  09/27/2011  . Imdur [isosorbide]  10/06/2011  . Verapamil  11/04/2008    reports that she has never smoked. She has never used smokeless tobacco. She reports that  drinks alcohol. She reports that she does not use illicit drugs.     Review of Systems: Pertinent positive and negative review of systems were noted in the above HPI section. All other review of systems were otherwise negative.  Vital signs were reviewed in today's medical  record Physical Exam: General: Well developed , well nourished, no acute distress Skin: anicteric Head: Normocephalic and atraumatic Eyes:  sclerae anicteric, EOMI Ears: Normal auditory acuity Mouth: No deformity or lesions Neck: Supple, no masses or thyromegaly Lungs: Clear  throughout to auscultation Heart: Regular rate and rhythm; no murmurs, rubs or bruits Abdomen: Soft, non tender and non distended. No masses, hepatosplenomegaly or hernias noted. Normal Bowel sounds.  There is tenderness in the left upper quadrant over the ninth and 10th ribs laterally and anteriorly Rectal: External skin tags are present Musculoskeletal: Symmetrical with no gross deformities  Skin: No lesions on visible extremities Pulses:  Normal pulses noted Extremities: No clubbing, cyanosis, edema or deformities noted Neurological: Alert oriented x 4, grossly nonfocal Cervical Nodes:  No significant cervical adenopathy Inguinal Nodes: No significant inguinal adenopathy Psychological:  Alert and cooperative. Normal mood and affect

## 2012-11-05 NOTE — Assessment & Plan Note (Signed)
See notes under rectal pain

## 2012-11-05 NOTE — Assessment & Plan Note (Signed)
Left upper quadrant pain is probably due to musculoskeletal pain as evidenced by tenderness over her ribs.  Plan local heat.  Patient has not tolerated NSAIDs in the past.

## 2012-11-05 NOTE — Telephone Encounter (Signed)
Will forward to MD for orders

## 2012-11-05 NOTE — Telephone Encounter (Signed)
She could hold the Plavix as needed if it is necessary for the procedure as mentioned.

## 2012-11-06 ENCOUNTER — Telehealth: Payer: Self-pay | Admitting: *Deleted

## 2012-11-06 NOTE — Telephone Encounter (Signed)
Dr [name], Dr Antoine Poche said this patient can come off plavix. She is stopping it tomorrow for the 10/2 banding in the office She wants to know when she will be able to start it back. You should check on that because im not sure if she will bleed with the banding. They say Dr Leone Payor is not doing the procedure with pts on a blood thinner

## 2012-11-06 NOTE — Telephone Encounter (Signed)
Ok to hold her dr hochrein see in other phone note

## 2012-11-06 NOTE — Telephone Encounter (Signed)
The patient can be placed back on Plavix the day following her procedure.  I spoke with Dr.Gorchynsky from [facility] who said that he will band patients who were taking blood thinners and antiplatelet medications if he felt the risk of stopping these medications were greater than the risk for bleeding.

## 2012-11-08 ENCOUNTER — Other Ambulatory Visit: Payer: Self-pay

## 2012-11-08 DIAGNOSIS — Z1231 Encounter for screening mammogram for malignant neoplasm of breast: Secondary | ICD-10-CM

## 2012-11-08 DIAGNOSIS — Z803 Family history of malignant neoplasm of breast: Secondary | ICD-10-CM

## 2012-11-08 NOTE — Telephone Encounter (Signed)
Patient aware she is ok to come off the plavix

## 2012-11-12 ENCOUNTER — Other Ambulatory Visit (INDEPENDENT_AMBULATORY_CARE_PROVIDER_SITE_OTHER): Payer: Medicare Other

## 2012-11-12 DIAGNOSIS — Z947 Corneal transplant status: Secondary | ICD-10-CM | POA: Diagnosis not present

## 2012-11-12 DIAGNOSIS — R195 Other fecal abnormalities: Secondary | ICD-10-CM

## 2012-11-12 LAB — FECAL OCCULT BLOOD, IMMUNOCHEMICAL: Fecal Occult Bld: NEGATIVE

## 2012-11-13 NOTE — Progress Notes (Signed)
Quick Note:  Please inform the patient that hemeoccult was normal. No further GI studies. ______

## 2012-11-14 ENCOUNTER — Ambulatory Visit: Payer: Medicare Other | Admitting: Gastroenterology

## 2012-11-14 VITALS — BP 110/70 | HR 76 | Ht 63.5 in | Wt 213.5 lb

## 2012-11-14 DIAGNOSIS — K648 Other hemorrhoids: Secondary | ICD-10-CM

## 2012-11-14 DIAGNOSIS — N390 Urinary tract infection, site not specified: Secondary | ICD-10-CM | POA: Diagnosis not present

## 2012-11-14 NOTE — Progress Notes (Signed)
PROCEDURE NOTE: The patient presents with symptomatic grade *3**  hemorrhoids, requesting rubber band ligation of his/her hemorrhoidal disease.  All risks, benefits and alternative forms of therapy were described and informed consent was obtained.   The anorectum was pre-medicated with *lubricant and nitroglycerin ointment** The decision was made to band the *right anterior** internal hemorrhoid, and the [facility] O'Regan System was used to perform band ligation without complication.  Digital anorectal examination was then performed to assure proper positioning of the band, and to adjust the banded tissue as required.  The patient was discharged home without pain or other issues.  Dietary and behavioral recommendations were given and along with follow-up instructions.     The following adjunctive treatments were recommended:  *Fiber supplementation  The patient was instructed to resume Plavix in a.m.**  The patient will return in *2-3 weeks** for  follow-up and possible additional banding as required. No complications were encountered and the patient tolerated the procedure well.

## 2012-11-14 NOTE — Patient Instructions (Addendum)
Resume Plavix tomorrow You will return on 12/13/2012 for 2nd banding You will return on 01/01/2013 for 3rd banding  HEMORRHOID BANDING PROCEDURE    FOLLOW-UP CARE   1. The procedure you have had should have been relatively painless since the banding of the area involved does not have nerve endings and there is no pain sensation.  The rubber band cuts off the blood supply to the hemorrhoid and the band may fall off as soon as 48 hours after the banding (the band may occasionally be seen in the toilet bowl following a bowel movement). You may notice a temporary feeling of fullness in the rectum which should respond adequately to plain Tylenol or Motrin.  2. Following the banding, avoid strenuous exercise that evening and resume full activity the next day.  A sitz bath (soaking in a warm tub) or bidet is soothing, and can be useful for cleansing the area after bowel movements.     3. To avoid constipation, take two tablespoons of natural wheat bran, natural oat bran, flax, Benefiber or any over the counter fiber supplement and increase your water intake to 7-8 glasses daily.    4. Unless you have been prescribed anorectal medication, do not put anything inside your rectum for two weeks: No suppositories, enemas, fingers, etc.  5. Occasionally, you may have more bleeding than usual after the banding procedure.  This is often from the untreated hemorrhoids rather than the treated one.  Don't be concerned if there is a tablespoon or so of blood.  If there is more blood than this, lie flat with your bottom higher than your head and apply an ice pack to the area. If the bleeding does not stop within a half an hour or if you feel faint, call our office at [phone] or go to the emergency room.  6. Problems are not common; however, if there is a substantial amount of bleeding, severe pain, chills, fever or difficulty passing urine (very rare) or other problems, you should call us at [phone]  or report to the nearest emergency room.  7. Do not stay seated continuously for more than 2-3 hours for a day or two after the procedure.  Tighten your buttock muscles 10-15 times every two hours and take 10-15 deep breaths every 1-2 hours.  Do not spend more than a few minutes on the toilet if you cannot empty your bowel; instead re-visit the toilet at a later time.

## 2012-11-19 ENCOUNTER — Ambulatory Visit (INDEPENDENT_AMBULATORY_CARE_PROVIDER_SITE_OTHER): Payer: Medicare Other

## 2012-11-19 ENCOUNTER — Telehealth: Payer: Self-pay | Admitting: Internal Medicine

## 2012-11-19 DIAGNOSIS — Z23 Encounter for immunization: Secondary | ICD-10-CM | POA: Diagnosis not present

## 2012-11-22 ENCOUNTER — Ambulatory Visit: Payer: Medicare Other

## 2012-11-25 DIAGNOSIS — T1500XA Foreign body in cornea, unspecified eye, initial encounter: Secondary | ICD-10-CM | POA: Diagnosis not present

## 2012-12-04 ENCOUNTER — Ambulatory Visit (INDEPENDENT_AMBULATORY_CARE_PROVIDER_SITE_OTHER): Payer: Medicare Other | Admitting: Neurology

## 2012-12-04 ENCOUNTER — Encounter: Payer: Self-pay | Admitting: Neurology

## 2012-12-04 VITALS — BP 118/71 | HR 62 | Ht 64.0 in | Wt 218.0 lb

## 2012-12-04 DIAGNOSIS — G43719 Chronic migraine without aura, intractable, without status migrainosus: Secondary | ICD-10-CM

## 2012-12-04 DIAGNOSIS — G43009 Migraine without aura, not intractable, without status migrainosus: Secondary | ICD-10-CM

## 2012-12-04 MED ORDER — ONABOTULINUMTOXINA 100 UNITS IJ SOLR
150.0000 [IU] | Freq: Once | INTRAMUSCULAR | Status: AC
  Administered 2012-12-04: 150 [IU] via INTRAMUSCULAR

## 2012-12-04 NOTE — Progress Notes (Signed)
History of present illness:    Connie Owens is a 67 y.o. female here as a referral from Dr. Vickey Huger for evaluation of chronic headaches.  She has a past medical history of of sharpish sleep apnea, using CPAP machine, also has past medical history of coronary artery disease, corneal disorder, status post bilateral corneal transplant, anxiety, hypertension, hyperlipidemia, migraine headaches  She presented with chronic headaches  since January 2013, she described almost constant daily left temporal frontal area pressure headaches, throbbing pain, intermixed with sharp piercing pain,  lasting for a few seconds, 8 out of 10, she denies visual loss, she denies light and noise sensitivity,  She has tried Cardizem calcium channel blocker, no change in her headache, she is also taking gabapentin 100 mg 3 times a day, with no significant improvement,     She is taking Tylenol, occasionally oxycodone, aspirin, without significant help either.atient having severe palpitations.  CRP and sedimentation rate were in normal range. The patient also had a Doppler study for a temporal arteritis., which was negative.  UPDATE Dec 04 2012: She has mild right upper eye lid droopy from previous injection.  She reported 30% improvement of her headaches.  Review of Systems: Out of a complete 14 system review, the patient complains of only the following symptoms, and all other reviewed systems are negative.    Social History  . Marital Status: Married    Spouse Name: N/A    Number of Children: N/A  . Years of Education: N/A   Occupational History  . retired    Social History Main Topics  . Smoking status: Never Smoker   . Smokeless tobacco: Never Used  . Alcohol Use: Yes     Comment: rare  . Drug Use: No  . Sexually Active: Not on file   Other Topics Concern  . Not on file   Social History Narrative   Daily caffeine   Regular exercise    Family History  Problem Relation Age of Onset  .  Breast cancer Mother   . Heart disease Father   . Stroke Father   . Dementia Father   . Diabetes Sister     3 sisters   . Heart disease Sister     1 sister CABG  . Breast cancer Sister     PTE pre & post Dx of ca  . Diabetes Paternal Grandfather   . Colon cancer Neg Hx   . Prostate cancer Father     Past Medical History  Diagnosis Date  . CAD (coronary artery disease)   . Corneal disorder   . Postmenopausal status   . Back pain   . Dysmetabolic syndrome X   . Arthritis     osteo...right knee  . Carpal tunnel syndrome   . GERD (gastroesophageal reflux disease)   . Diverticular disease   . Hx of adenomatous colonic polyps   . Hernia   . Anxiety state   . Hypertension   . Shoulder fracture   . HLD (hyperlipidemia)   . Migraine headache without aura 07/23/2012  . Headache, paroxysmal hemicrania, chronic 07/23/2012    Past Surgical History  Procedure Laterality Date  . Appendectomy    . Cholecystectomy    . Abdominal hysterectomy    . Breast lumpectomy Right 1996  . Colonoscopy w/ polypectomy    . Lumbar spur removed    . Total knee arthroplasty Right   . Bunionectomy Right   . Ankle cystectomy Right   . Rectocele  surgery    . 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 Hemorrhoid surgery  12/01/2010    THD hemorrhoid ligation/pexy  . Cardiac catheterization  09/28/2011    Dr Elease Hashimoto(  to see Dr Myrtis Ser)  . 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 Corneal transplant Left     x1    Current Outpatient Prescriptions  Medication Sig Dispense Refill  . brimonidine-timolol (COMBIGAN) 0.2-0.5 % ophthalmic solution Place 1 drop into the left eye 2 (two) times daily.      . Carboxymethylcellulose Sodium (LUBRICANT EYE DROPS OP) Apply 1 drop to eye daily as needed. For dry or itchy eyes.       . carvedilol (COREG) 3.125 MG tablet Take 1 tablet (3.125 mg total) by mouth 2 (two) times daily.  180 tablet  3  . carvedilol (COREG) 6.25 MG tablet Take 1 tablet (6.25 mg total) by  mouth 2 (two) times daily.  180 tablet  3  . cholecalciferol (VITAMIN D) 1000 UNITS tablet Take 2,000 Units by mouth daily.      . clopidogrel (PLAVIX) 75 MG tablet Take 1 tablet (75 mg total) by mouth daily with breakfast.  90 tablet  3  . esomeprazole (NEXIUM) 40 MG capsule Take 40 mg by mouth daily before breakfast.      . estradiol (ESTRACE) 1 MG tablet Take 1 mg by mouth daily.        [name] gabapentin (NEURONTIN) 100 MG capsule Take 1 capsule (100 mg total) by mouth every 8 (eight) hours as needed. For pain Office visit due  270 capsule  1  . hydrocortisone (ANUSOL-HC) 25 MG suppository Place 1 suppository (25 mg total) rectally every 12 (twelve) hours.  12 suppository  1  . loperamide (IMODIUM) 2 MG capsule Take 2 mg by mouth 4 (four) times daily as needed. For loose stool      . losartan (COZAAR) 50 MG tablet Take 1 tablet (50 mg total) by mouth daily. 1 by mouth once daily  90 tablet  3  . meloxicam (MOBIC) 15 MG tablet Take 15 mg by mouth daily.      . Methylcellulose (MUROCEL OP) Apply 5 % to eye. Ointment for right eye at night as needed      . nitrofurantoin, macrocrystal-monohydrate, (MACROBID) 100 MG capsule Take 100 mg by mouth daily.      . nitroGLYCERIN (NITROSTAT) 0.4 MG SL tablet Place 1 tablet (0.4 mg total) under the tongue every 5 (five) minutes x 3 doses as needed for chest pain.  30 tablet  6  . pravastatin (PRAVACHOL) 80 MG tablet Take 1 tablet (80 mg total) by mouth every evening.  90 tablet  3  . prednisoLONE acetate (PRED FORTE) 1 % ophthalmic suspension Place 1 drop into both eyes 2 (two) times daily.       . psyllium (METAMUCIL) 58.6 % powder Take 1 packet by mouth 3 (three) times daily as needed. For regularity      . zolpidem (AMBIEN) 10 MG tablet Take 1 tablet (10 mg total) by mouth at bedtime.  90 tablet  1   Current Facility-Administered Medications  Medication Dose Route Frequency Provider Last Rate Last Dose  . botulinum toxin Type A (BOTOX) injection 150 Units   150 Units Intramuscular Once Levert Feinstein, MD        Allergies as of 12/04/2012 - Review Complete 12/04/2012  Allergen Reaction Noted  . Iohexol  08/12/2003  . Iodine  09/27/2011  .  Latex  09/27/2011  . Tape  09/27/2011  . Imdur [isosorbide]  10/06/2011  . Verapamil  11/04/2008    Vitals: BP 118/71  Pulse 62  Ht 5\' 4"  (1.626 m)  Wt 218 lb (98.884 kg)  BMI 37.4 kg/m2 Last Weight:  Wt Readings from Last 1 Encounters:  12/04/12 218 lb (98.884 kg)   Last Height:   Ht Readings from Last 1 Encounters:  12/04/12 5\' 4"  (1.626 m)   Vision Screening:   Cornea transplant, total of 3 surgeries.  Physical exam:  General: The patient is awake, alert and appears not in acute distress. The patient is well groomed. Head: Normocephalic, atraumatic.  Neck is supple.  Cardiovascular:  Regular rate and rhythm, without  murmurs or carotid bruit, and without distended neck veins. Respiratory: Lungs are clear to auscultation. Skin:  Without evidence of edema, or rash Trunk: BMI is elevated and patient  has normal posture.  Neurologic exam : The patient is awake and alert, oriented to place and time.   Speech is fluent without  dysarthria, dysphonia or aphasia.   Cranial nerves: Pupils are equal and briskly reactive to light. S/p bilateral corneal implantation.  Hearing to finger rub intact.  Facial sensation intact to fine touch. Facial motor strength is symmetric and tongue and uvula move midline.  Motor exam:   Normal tone and normal muscle bulk and symmetric normal strength in all extremities.   Sensory:  Intact to light touch pin prick.  Coordination: Rapid alternating movements in the fingers/hands is tested and normal. Finger-to-nose maneuver tested and normal without evidence of ataxia, dysmetria or tremor.  Gait and station: Patient walks without assistive device and is able and assisted stool climb up to the exam table. Strength within normal limits. Stance is stable and normal.  Tandem gait is  unfragmented.   Deep tendon reflexes: normal and symmetric  Assessment and Plan:  67 years old right-handed Caucasian female, with chronic headaches since January 2013, failed preventive medication Neurontin, calcium blocker,  BOTOX injection was performed according to protocol by [facility]. 100 units of BOTOX was dissolved into 2 cc NS. (Lot [id number],  exp Jan 2017).  Total of 150 units,   Corrugator 2 sites, 10 units Procerus 1 site, 5 unit Frontalis 4 sites,  20 units, Temporalis 8 sites,  40 units  Occipitalis 6 sites, 30 units Cervical Paraspinal, 4 sites, 20 units Trapezius, 6 sites, 30 units  Patient tolerate the injection well. Will return for repeat injection in 3 months.

## 2012-12-13 ENCOUNTER — Encounter: Payer: Medicare Other | Admitting: Gastroenterology

## 2012-12-14 DIAGNOSIS — Z4789 Encounter for other orthopedic aftercare: Secondary | ICD-10-CM | POA: Diagnosis not present

## 2012-12-14 DIAGNOSIS — M19049 Primary osteoarthritis, unspecified hand: Secondary | ICD-10-CM | POA: Diagnosis not present

## 2012-12-16 ENCOUNTER — Ambulatory Visit
Admission: RE | Admit: 2012-12-16 | Discharge: 2012-12-16 | Disposition: A | Discharge status: Home / Self Care | Payer: Medicare Other | Source: Ambulatory Visit

## 2012-12-16 DIAGNOSIS — Z1231 Encounter for screening mammogram for malignant neoplasm of breast: Secondary | ICD-10-CM | POA: Diagnosis not present

## 2012-12-16 DIAGNOSIS — Z803 Family history of malignant neoplasm of breast: Secondary | ICD-10-CM

## 2012-12-17 ENCOUNTER — Encounter: Payer: Self-pay | Admitting: Neurology

## 2012-12-18 ENCOUNTER — Telehealth: Payer: Self-pay | Admitting: *Deleted

## 2012-12-18 NOTE — Telephone Encounter (Signed)
OK # [phone] 

## 2012-12-18 NOTE — Telephone Encounter (Signed)
gabapentin (NEURONTIN) 100 MG capsule Last refill: 07/24/2012 Last OV: 07/24/2012

## 2012-12-19 ENCOUNTER — Other Ambulatory Visit: Payer: Self-pay | Admitting: *Deleted

## 2012-12-19 DIAGNOSIS — R52 Pain, unspecified: Secondary | ICD-10-CM

## 2012-12-19 MED ORDER — GABAPENTIN 100 MG PO CAPS: 100.0000 mg | ORAL_CAPSULE | Freq: Three times a day (TID) | ORAL | Status: DC | PRN

## 2012-12-19 NOTE — Telephone Encounter (Signed)
Done

## 2012-12-19 NOTE — Telephone Encounter (Signed)
Gabapentin refilled

## 2012-12-23 ENCOUNTER — Ambulatory Visit (INDEPENDENT_AMBULATORY_CARE_PROVIDER_SITE_OTHER): Payer: Medicare Other | Admitting: Gastroenterology

## 2012-12-23 ENCOUNTER — Encounter: Payer: Self-pay | Admitting: Gastroenterology

## 2012-12-23 VITALS — BP 134/68 | HR 64 | Ht 64.0 in | Wt 214.0 lb

## 2012-12-23 DIAGNOSIS — K648 Other hemorrhoids: Secondary | ICD-10-CM | POA: Diagnosis not present

## 2012-12-23 NOTE — Patient Instructions (Signed)
       HEMORRHOID BANDING PROCEDURE       FOLLOW-UP CARE         The procedure you have had should have been relatively painless since the banding of the area involved does not have nerve endings and there is no pain sensation. The rubber band cuts off the blood supply to the hemorrhoid and the band may fall off as soon as 48 hours after the banding (the band may occasionally be seen in the toilet bowl following a bowel movement). You may notice a temporary feeling of fullness in the rectum which should respond adequately to plain Tylenol or Motrin.      Following the banding, avoid strenuous exercise that evening and resume full activity the next day. A sitz bath (soaking in a warm tub) or bidet is soothing, and can be useful for cleansing the area after bowel movements.          To avoid constipation, take two tablespoons of natural wheat bran, natural oat bran, flax, Benefiber or any over the counter fiber supplement and increase your water intake to 7-8 glasses daily.       Unless you have been prescribed anorectal medication, do not put anything inside your rectum for two weeks: No suppositories, enemas, fingers, etc.      Occasionally, you may have more bleeding than usual after the banding procedure. This is often from the untreated hemorrhoids rather than the treated one. Don't be concerned if there is a tablespoon or so of blood. If there is more blood than this, lie flat with your bottom higher than your head and apply an ice pack to the area. If the bleeding does not stop within a half an hour or if you feel faint, call our office at [phone] or go to the emergency room.      Problems are not common; however, if there is a substantial amount of bleeding, severe pain, chills, fever or difficulty passing urine (very rare) or other problems, you should call us at [phone] or report to the nearest emergency room.      Do not stay seated continuously for more than 2-3  hours for a day or two after the procedure. Tighten your buttock muscles 10-15 times every two hours and take 10-15 deep breaths every 1-2 hours. Do not spend more than a few minutes on the toilet if you cannot empty your bowel; instead re-visit the toilet at a later time.                              Long Term Prevention of Recurrent Hemorrhoids:         Fiber - Western diets are typically deficient in dietary fiber, and the addition of 15 - 20 gm. of fiber will help you have stools of a proper consistency, limiting your need to strain. In addition to the use of raw oat or wheat bran, there are a number of commercial preparations that are available (Metamucil, Benefiber and Citrucel are just a few).       Fluids - It is important to have a sufficient amount of water intake during the day, in part to help the fiber "do its job". Unless you have a medical condition that would prohibit it, a minimum of 6 - 8 glasses per day is important to help keep a regular bowel movement.      Do   not strain - Many experts feel that chronic straining is one of the causes for the development of hemorrhoids. Trying to limit yourself to two minutes on the commode may well limit your risk of recurrent hemorrhoids. Also, do not try to "hold it" or avoid going to the bathroom when the urge is there. These behavioral changes are thought to be very helpful in maintaining good bowel health.     

## 2012-12-23 NOTE — Progress Notes (Signed)
PROCEDURE NOTE: The patient presents with symptomatic grade **3*  hemorrhoids, requesting rubber band ligation of his/her hemorrhoidal disease.  All risks, benefits and alternative forms of therapy were described and informed consent was obtained.  External skin tags were present. The anorectum was pre-medicated with *nitroglycerin ointment and lubricant** The decision was made to band the *right posterior** internal hemorrhoid, and the [facility] O'Regan System was used to perform band ligation without complication.  Digital anorectal examination was then performed to assure proper positioning of the band, and to adjust the banded tissue as required.  The patient was discharged home without pain or other issues.  Dietary and behavioral recommendations were given and along with follow-up instructions.     The following adjunctive treatments were recommended:  *None**  The patient will return in *2** for  follow-up and possible additional banding as required. No complications were encountered and the patient tolerated the procedure well.

## 2012-12-24 ENCOUNTER — Telehealth: Payer: Self-pay | Admitting: Internal Medicine

## 2012-12-24 ENCOUNTER — Other Ambulatory Visit: Payer: Self-pay | Admitting: *Deleted

## 2012-12-24 DIAGNOSIS — R52 Pain, unspecified: Secondary | ICD-10-CM

## 2012-12-24 MED ORDER — GABAPENTIN 100 MG PO CAPS: 100.0000 mg | ORAL_CAPSULE | Freq: Three times a day (TID) | ORAL | Status: DC | PRN

## 2012-12-24 NOTE — Telephone Encounter (Signed)
Gabapentin refill cancelled at [facility] and sent to [facility] per patient request

## 2012-12-24 NOTE — Telephone Encounter (Signed)
Patient needs for gabapentin (NEURONTIN) 100 MG rx to be sent to [facility] pharmacy. It was sent to [facility] and she does not want to pick it up from there.  Patient also wants shingles injection. Please advise if okay.

## 2012-12-24 NOTE — Telephone Encounter (Signed)
Done

## 2012-12-31 ENCOUNTER — Ambulatory Visit (INDEPENDENT_AMBULATORY_CARE_PROVIDER_SITE_OTHER): Payer: Medicare Other | Admitting: *Deleted

## 2012-12-31 DIAGNOSIS — Z23 Encounter for immunization: Secondary | ICD-10-CM

## 2012-12-31 DIAGNOSIS — Z2911 Encounter for prophylactic immunotherapy for respiratory syncytial virus (RSV): Secondary | ICD-10-CM

## 2013-01-01 ENCOUNTER — Encounter: Payer: Medicare Other | Admitting: Gastroenterology

## 2013-01-13 ENCOUNTER — Ambulatory Visit (INDEPENDENT_AMBULATORY_CARE_PROVIDER_SITE_OTHER): Payer: Medicare Other | Admitting: Gastroenterology

## 2013-01-13 ENCOUNTER — Encounter: Payer: Self-pay | Admitting: Gastroenterology

## 2013-01-13 VITALS — BP 128/84 | HR 69 | Ht 64.0 in | Wt 218.4 lb

## 2013-01-13 DIAGNOSIS — K648 Other hemorrhoids: Secondary | ICD-10-CM | POA: Diagnosis not present

## 2013-01-13 NOTE — Progress Notes (Signed)
PROCEDURE NOTE: The patient presents with symptomatic grade *3**  hemorrhoids, requesting rubber band ligation of his/her hemorrhoidal disease.  All risks, benefits and alternative forms of therapy were described and informed consent was obtained.   The anorectum was pre-medicated with lubricant and nitroglycerine ointment The decision was made to band the *left lateral** internal hemorrhoid, and the [facility] O'Regan System was used to perform band ligation without complication.  Digital anorectal examination was then performed to assure proper positioning of the band, and to adjust the banded tissue as required.  The patient was discharged home without pain or other issues.  Dietary and behavioral recommendations were given and along with follow-up instructions.    The patient will return in *4** for  follow-up No complications were encountered and the patient tolerated the procedure well.

## 2013-01-13 NOTE — Patient Instructions (Signed)
Follow up in one month  HEMORRHOID BANDING PROCEDURE    FOLLOW-UP CARE   1. The procedure you have had should have been relatively painless since the banding of the area involved does not have nerve endings and there is no pain sensation.  The rubber band cuts off the blood supply to the hemorrhoid and the band may fall off as soon as 48 hours after the banding (the band may occasionally be seen in the toilet bowl following a bowel movement). You may notice a temporary feeling of fullness in the rectum which should respond adequately to plain Tylenol or Motrin.  2. Following the banding, avoid strenuous exercise that evening and resume full activity the next day.  A sitz bath (soaking in a warm tub) or bidet is soothing, and can be useful for cleansing the area after bowel movements.     3. To avoid constipation, take two tablespoons of natural wheat bran, natural oat bran, flax, Benefiber or any over the counter fiber supplement and increase your water intake to 7-8 glasses daily.    4. Unless you have been prescribed anorectal medication, do not put anything inside your rectum for two weeks: No suppositories, enemas, fingers, etc.  5. Occasionally, you may have more bleeding than usual after the banding procedure.  This is often from the untreated hemorrhoids rather than the treated one.  Don't be concerned if there is a tablespoon or so of blood.  If there is more blood than this, lie flat with your bottom higher than your head and apply an ice pack to the area. If the bleeding does not stop within a half an hour or if you feel faint, call our office at [phone] or go to the emergency room.  6. Problems are not common; however, if there is a substantial amount of bleeding, severe pain, chills, fever or difficulty passing urine (very rare) or other problems, you should call us at [phone] or report to the nearest emergency room.  7. Do not stay seated continuously for more than  2-3 hours for a day or two after the procedure.  Tighten your buttock muscles 10-15 times every two hours and take 10-15 deep breaths every 1-2 hours.  Do not spend more than a few minutes on the toilet if you cannot empty your bowel; instead re-visit the toilet at a later time.

## 2013-01-22 ENCOUNTER — Ambulatory Visit: Payer: Medicare Other | Admitting: Neurology

## 2013-02-24 ENCOUNTER — Encounter: Payer: Self-pay | Admitting: Gastroenterology

## 2013-02-24 ENCOUNTER — Ambulatory Visit (INDEPENDENT_AMBULATORY_CARE_PROVIDER_SITE_OTHER): Payer: Medicare Other | Admitting: Gastroenterology

## 2013-02-24 VITALS — BP 112/80 | HR 88 | Ht 63.5 in | Wt 219.0 lb

## 2013-02-24 DIAGNOSIS — R1012 Left upper quadrant pain: Secondary | ICD-10-CM

## 2013-02-24 DIAGNOSIS — K6289 Other specified diseases of anus and rectum: Secondary | ICD-10-CM | POA: Diagnosis not present

## 2013-02-24 MED ORDER — CELECOXIB 200 MG PO CAPS: 200.0000 mg | ORAL_CAPSULE | Freq: Every day | ORAL | Status: DC

## 2013-02-24 NOTE — Assessment & Plan Note (Signed)
Status post band ligation without improvement in symptoms.  Endoscopy exam is negative except for small hemorrhoids.  I believe that pain is 2 to discomfort in the soft tissues.  There is no obvious mucosal lesion.  Recommendations #1 trial of Celebrex

## 2013-02-24 NOTE — Assessment & Plan Note (Signed)
I continue to suspect that left upper quadrant pain is probably due to musculoskeletal pain.  Plan local heat and careful trial of Celebrex.  Patient has not tolerated NSAIDs in the past.

## 2013-02-24 NOTE — Patient Instructions (Signed)
Your prescription will be sent to your pharmacy

## 2013-02-24 NOTE — Progress Notes (Signed)
          History of Present Illness:  The patient continues to complain of a throbbing-type discomfort in the left side of her rectum.  It may be exacerbated by moving about or by moving her bowels.  She completed 3 hemorrhoidal banding is which did not impact on this discomfort.  She's had no overt bleeding.  She underwent colonoscopy in June, 2011 where a small adenomatous polyp was removed.  Sigmoidoscopy in December, 2011, for rectal pain, was pertinent for internal hemorrhoids.  She also continues to complain of mild left upper quadrant pain that may be slightly worsened postprandially.  She's on Nexium.    Review of Systems: Pertinent positive and negative review of systems were noted in the above HPI section. All other review of systems were otherwise negative.    Current Medications, Allergies, Past Medical History, Past Surgical History, Family History and Social History were reviewed in [facility] record  Vital signs were reviewed in today's medical record. Physical Exam: General: Well developed , well nourished, no acute distress Skin: anicteric Head: Normocephalic and atraumatic Eyes:  sclerae anicteric, EOMI Ears: Normal auditory acuity Mouth: No deformity or lesions Lungs: Clear throughout to auscultation Heart: Regular rate and rhythm; no murmurs, rubs or bruits Abdomen: Soft, and non distended. No masses, hepatosplenomegaly or hernias noted. Normal Bowel sounds.  There is mild tenderness to palpation left or quadrant that worsens with abdominal muscle wall flexion Rectal: There is mild tenderness to palpation in the left lateral side of the rectum and 9:00 Musculoskeletal: Symmetrical with no gross deformities  Pulses:  Normal pulses noted Extremities: No clubbing, cyanosis, edema or deformities noted Neurological: Alert oriented x 4, grossly nonfocal Psychological:  Alert and cooperative. Normal mood and affect  Endoscopy was performed.   Small internal hemorrhoids were seen.  There were no anal lesions or fissures  See Assessment and Plan under Problem List

## 2013-03-05 ENCOUNTER — Encounter: Payer: Self-pay | Admitting: Neurology

## 2013-03-06 NOTE — Telephone Encounter (Signed)
Error

## 2013-03-12 ENCOUNTER — Encounter: Payer: Self-pay | Admitting: Neurology

## 2013-03-12 ENCOUNTER — Ambulatory Visit (INDEPENDENT_AMBULATORY_CARE_PROVIDER_SITE_OTHER): Payer: Medicare Other | Admitting: Neurology

## 2013-03-12 VITALS — BP 127/67 | HR 64 | Ht 65.0 in | Wt 217.0 lb

## 2013-03-12 DIAGNOSIS — G43719 Chronic migraine without aura, intractable, without status migrainosus: Secondary | ICD-10-CM | POA: Diagnosis not present

## 2013-03-12 DIAGNOSIS — G43009 Migraine without aura, not intractable, without status migrainosus: Secondary | ICD-10-CM

## 2013-03-12 MED ORDER — ONABOTULINUMTOXINA 100 UNITS IJ SOLR
150.0000 [IU] | Freq: Once | INTRAMUSCULAR | Status: AC
  Administered 2013-03-12: 150 [IU] via INTRAMUSCULAR

## 2013-03-12 NOTE — Progress Notes (Signed)
History of present illness:    Connie Owens is a 68 y.o. female here as a referral from Dr. Brett Fairy for evaluation of chronic headaches.  She has a past medical history of of sharpish sleep apnea, using CPAP machine, also has past medical history of coronary artery disease, corneal disorder, status post bilateral corneal transplant, anxiety, hypertension, hyperlipidemia, migraine headaches  She presented with chronic headaches  since January 2013, she described almost constant daily left temporal frontal area pressure headaches, throbbing pain, intermixed with sharp piercing pain,  lasting for a few seconds, 8 out of 10, she denies visual loss, she denies light and noise sensitivity,  She has tried Cardizem calcium channel blocker, no change in her headache, she is also taking gabapentin 100 mg 3 times a day, with no significant improvement,     She is taking Tylenol, occasionally oxycodone, aspirin, without significant help either.atient having severe palpitations.  CRP and sedimentation rate were in normal range. The patient also had a Doppler study for a temporal arteritis., which was negative.  She began to receive Botox injection as her chronic migraine prevention since July 2014,  UPDATE Jan 28th 2015:  She responded better from last injection in October 2014,.  She reported 45% improvement of her headaches. There was no significant side effect notice, she has 3-4 milder headache each week, taking Tylenol as needed, she is already taking Tylenol for her arthritis knee pain    Review of Systems:  Out of a complete 14 system review, the patient complains of only the following symptoms, and all other reviewed systems are negative.    Social History  . Marital Status: Married    Spouse Name: N/A    Number of Children: N/A  . Years of Education: N/A   Occupational History  . retired    Social History Main Topics  . Smoking status: Never Smoker   . Smokeless tobacco: Never  Used  . Alcohol Use: Yes     Comment: rare  . Drug Use: No  . Sexually Active: Not on file   Other Topics Concern  . Not on file   Social History Narrative   Daily caffeine   Regular exercise    Family History  Problem Relation Age of Onset  . Breast cancer Mother   . Heart disease Father   . Stroke Father   . Dementia Father   . Diabetes Sister     3 sisters   . Heart disease Sister     1 sister CABG  . Breast cancer Sister     PTE pre & post Dx of ca  . Diabetes Paternal Grandfather   . Colon cancer Neg Hx   . Prostate cancer Father     Past Medical History  Diagnosis Date  . CAD (coronary artery disease)   . Corneal disorder   . Postmenopausal status   . Back pain   . Dysmetabolic syndrome X   . Arthritis     osteo...right knee  . Carpal tunnel syndrome   . GERD (gastroesophageal reflux disease)   . Diverticular disease   . Hx of adenomatous colonic polyps   . Hernia   . Anxiety state   . Hypertension   . Shoulder fracture   . HLD (hyperlipidemia)   . Migraine headache without aura 07/23/2012  . Headache, paroxysmal hemicrania, chronic 07/23/2012    Past Surgical History  Procedure Laterality Date  . Appendectomy    . Cholecystectomy    .  Abdominal hysterectomy    . Breast lumpectomy Right 1996  . Colonoscopy w/ polypectomy    . Lumbar spur removed    . Total knee arthroplasty Right   . Bunionectomy Right   . Ankle cystectomy Right   . Rectocele surgery    . 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 Hemorrhoid surgery  12/01/2010    [place] hemorrhoid ligation/pexy  . Cardiac catheterization  09/28/2011    Dr Acie Fredrickson(  to see Dr Ron Parker)  . 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 Corneal transplant Left     x1    Current Outpatient Prescriptions  Medication Sig Dispense Refill  . brimonidine-timolol (COMBIGAN) 0.2-0.5 % ophthalmic solution Place 1 drop into the left eye 2 (two) times daily.      . Carboxymethylcellulose Sodium (LUBRICANT EYE DROPS  OP) Apply 1 drop to eye daily as needed. For dry or itchy eyes.       . carvedilol (COREG) 3.125 MG tablet Take 1 tablet (3.125 mg total) by mouth 2 (two) times daily.  180 tablet  3  . carvedilol (COREG) 6.25 MG tablet Take 1 tablet (6.25 mg total) by mouth 2 (two) times daily.  180 tablet  3  . celecoxib (CELEBREX) 200 MG capsule Take 1 capsule (200 mg total) by mouth daily.  25 capsule  1  . cholecalciferol (VITAMIN D) 1000 UNITS tablet Take 2,000 Units by mouth daily.      . clopidogrel (PLAVIX) 75 MG tablet Take 1 tablet (75 mg total) by mouth daily with breakfast.  90 tablet  3  . esomeprazole (NEXIUM) 40 MG capsule Take 40 mg by mouth daily before breakfast.      . estradiol (ESTRACE) 1 MG tablet Take 1 mg by mouth daily.        [name] gabapentin (NEURONTIN) 100 MG capsule Take 1 capsule (100 mg total) by mouth every 8 (eight) hours as needed. For pain Office visit due  270 capsule  0  . loperamide (IMODIUM) 2 MG capsule Take 2 mg by mouth 4 (four) times daily as needed. For loose stool      . losartan (COZAAR) 50 MG tablet Take 1 tablet (50 mg total) by mouth daily. 1 by mouth once daily  90 tablet  3  . nitroGLYCERIN (NITROSTAT) 0.4 MG SL tablet Place 1 tablet (0.4 mg total) under the tongue every 5 (five) minutes x 3 doses as needed for chest pain.  30 tablet  6  . pravastatin (PRAVACHOL) 80 MG tablet Take 1 tablet (80 mg total) by mouth every evening.  90 tablet  3  . prednisoLONE acetate (PRED FORTE) 1 % ophthalmic suspension Place 1 drop into both eyes 2 (two) times daily.       . psyllium (METAMUCIL) 58.6 % powder Take 1 packet by mouth 3 (three) times daily as needed. For regularity      . zolpidem (AMBIEN) 10 MG tablet Take 1 tablet (10 mg total) by mouth at bedtime.  90 tablet  1   Current Facility-Administered Medications  Medication Dose Route Frequency Provider Last Rate Last Dose  . botulinum toxin Type A (BOTOX) injection 150 Units  150 Units Intramuscular Once Marcial Pacas, MD         Allergies as of 03/12/2013 - Review Complete 02/24/2013  Allergen Reaction Noted  . Iohexol  08/12/2003  . Iodine  09/27/2011  . Latex  09/27/2011  . Tape  09/27/2011  . Imdur [isosorbide]  10/06/2011  .  Verapamil  11/04/2008    Vitals: BP 127/67  Pulse 64  Ht 5\' 5"  (1.651 m)  Wt 217 lb (98.431 kg)  BMI 36.11 kg/m2 Last Weight:  Wt Readings from Last 1 Encounters:  03/12/13 217 lb (98.431 kg)   Last Height:   Ht Readings from Last 1 Encounters:  03/12/13 5\' 5"  (1.651 m)   Vision Screening:   Cornea transplant, total of 3 surgeries.  Physical exam:  General: The patient is awake, alert and appears not in acute distress. The patient is well groomed. Head: Normocephalic, atraumatic.  Neck is supple.  Cardiovascular:  Regular rate and rhythm, without  murmurs or carotid bruit, and without distended neck veins. Respiratory: Lungs are clear to auscultation. Skin:  Without evidence of edema, or rash Trunk: BMI is elevated and patient  has normal posture.  Neurologic exam : The patient is awake and alert, oriented to place and time.   Speech is fluent without  dysarthria, dysphonia or aphasia.   Cranial nerves: Pupils are equal and briskly reactive to light. S/p bilateral corneal implantation.  Hearing to finger rub intact.  Facial sensation intact to fine touch. Facial motor strength is symmetric and tongue and uvula move midline.  Motor exam:   Normal tone and normal muscle bulk and symmetric normal strength in all extremities.   Sensory:  Intact to light touch pin prick.  Coordination: Rapid alternating movements in the fingers/hands is tested and normal. Finger-to-nose maneuver tested and normal without evidence of ataxia, dysmetria or tremor.  Gait and station: Patient walks without assistive device and is able and assisted stool climb up to the exam table. Strength within normal limits. Stance is stable and normal. Tandem gait is  unfragmented.   Deep tendon  reflexes: normal and symmetric  Assessment and Plan:  68 years old right-handed Caucasian female, with chronic headaches since January 2013, failed preventive medication Neurontin, calcium blocker,  BOTOX injection was performed according to protocol by [facility]. (Lot [id number],  exp  June 2017, 100 units of BOTOX/ 2 cc NS).  Total of 150 units,   Corrugator 2 sites, 10 units Procerus 1 site, 5 unit Frontalis 4 sites,  20 units, Temporalis 8 sites,  40 units  Occipitalis 6 sites, 30 units Cervical Paraspinal, 4 sites, 20 units Trapezius, 6 sites, 30 units  Patient tolerate the injection well. Will return for repeat injection in 3 months.

## 2013-03-14 ENCOUNTER — Telehealth: Payer: Self-pay

## 2013-03-14 DIAGNOSIS — R935 Abnormal findings on diagnostic imaging of other abdominal regions, including retroperitoneum: Secondary | ICD-10-CM

## 2013-03-14 DIAGNOSIS — K7460 Unspecified cirrhosis of liver: Secondary | ICD-10-CM

## 2013-03-14 NOTE — Telephone Encounter (Signed)
Left message for call back. Non-identifiable.   Pap- 09/11/12- negative CCS- 01/21/10- internal hemorrhoids, otherwise normal.  MMG- 12/16/12-negative Flu-11/19/12 PNA- 09/29/11 Shingles 12/31/12

## 2013-03-18 ENCOUNTER — Ambulatory Visit (INDEPENDENT_AMBULATORY_CARE_PROVIDER_SITE_OTHER): Payer: Medicare Other | Admitting: Family Medicine

## 2013-03-18 ENCOUNTER — Encounter: Payer: Self-pay | Admitting: Family Medicine

## 2013-03-18 VITALS — BP 124/62 | HR 58 | Temp 98.1°F | Ht 64.75 in | Wt 217.8 lb

## 2013-03-18 DIAGNOSIS — R42 Dizziness and giddiness: Secondary | ICD-10-CM

## 2013-03-18 DIAGNOSIS — Z0000 Encounter for general adult medical examination without abnormal findings: Secondary | ICD-10-CM

## 2013-03-18 DIAGNOSIS — K219 Gastro-esophageal reflux disease without esophagitis: Secondary | ICD-10-CM

## 2013-03-18 DIAGNOSIS — E2839 Other primary ovarian failure: Secondary | ICD-10-CM

## 2013-03-18 DIAGNOSIS — R1013 Epigastric pain: Secondary | ICD-10-CM

## 2013-03-18 DIAGNOSIS — N951 Menopausal and female climacteric states: Secondary | ICD-10-CM

## 2013-03-18 DIAGNOSIS — R21 Rash and other nonspecific skin eruption: Secondary | ICD-10-CM

## 2013-03-18 DIAGNOSIS — R109 Unspecified abdominal pain: Secondary | ICD-10-CM

## 2013-03-18 DIAGNOSIS — E785 Hyperlipidemia, unspecified: Secondary | ICD-10-CM | POA: Diagnosis not present

## 2013-03-18 DIAGNOSIS — G8929 Other chronic pain: Secondary | ICD-10-CM

## 2013-03-18 DIAGNOSIS — E669 Obesity, unspecified: Secondary | ICD-10-CM | POA: Insufficient documentation

## 2013-03-18 DIAGNOSIS — R209 Unspecified disturbances of skin sensation: Secondary | ICD-10-CM

## 2013-03-18 DIAGNOSIS — N390 Urinary tract infection, site not specified: Secondary | ICD-10-CM

## 2013-03-18 DIAGNOSIS — R200 Anesthesia of skin: Secondary | ICD-10-CM

## 2013-03-18 DIAGNOSIS — I10 Essential (primary) hypertension: Secondary | ICD-10-CM

## 2013-03-18 LAB — CBC WITH DIFFERENTIAL/PLATELET
BASOS PCT: 0.4 % (ref 0.0–3.0)
Basophils Absolute: 0 10*3/uL (ref 0.0–0.1)
EOS ABS: 0.1 10*3/uL (ref 0.0–0.7)
EOS PCT: 1.6 % (ref 0.0–5.0)
HCT: 41.8 % (ref 36.0–46.0)
HEMOGLOBIN: 13.6 g/dL (ref 12.0–15.0)
LYMPHS PCT: 28.5 % (ref 12.0–46.0)
Lymphs Abs: 2.1 10*3/uL (ref 0.7–4.0)
MCHC: 32.6 g/dL (ref 30.0–36.0)
MCV: 93.4 fl (ref 78.0–100.0)
MONOS PCT: 8.9 % (ref 3.0–12.0)
Monocytes Absolute: 0.7 10*3/uL (ref 0.1–1.0)
Neutro Abs: 4.5 10*3/uL (ref 1.4–7.7)
Neutrophils Relative %: 60.6 % (ref 43.0–77.0)
Platelets: 278 10*3/uL (ref 150.0–400.0)
RBC: 4.48 Mil/uL (ref 3.87–5.11)
RDW: 13.9 % (ref 11.5–14.6)
WBC: 7.5 10*3/uL (ref 4.5–10.5)

## 2013-03-18 LAB — POCT URINALYSIS DIPSTICK
BILIRUBIN UA: NEGATIVE
Blood, UA: NEGATIVE
Glucose, UA: NEGATIVE
KETONES UA: NEGATIVE
NITRITE UA: NEGATIVE
Protein, UA: NEGATIVE
Spec Grav, UA: 1.01
Urobilinogen, UA: 0.2
pH, UA: 6

## 2013-03-18 LAB — BASIC METABOLIC PANEL
BUN: 14 mg/dL (ref 6–23)
CALCIUM: 9.3 mg/dL (ref 8.4–10.5)
CO2: 26 meq/L (ref 19–32)
Chloride: 105 mEq/L (ref 96–112)
Creatinine, Ser: 0.7 mg/dL (ref 0.4–1.2)
GFR: 88.47 mL/min (ref >60.00)
Glucose, Bld: 92 mg/dL (ref 70–99)
Potassium: 4.3 mEq/L (ref 3.5–5.1)
SODIUM: 137 meq/L (ref 135–145)

## 2013-03-18 LAB — HEPATIC FUNCTION PANEL
ALK PHOS: 58 U/L (ref 39–117)
ALT: 16 U/L (ref 0–35)
AST: 18 U/L (ref 0–37)
Albumin: 3.6 g/dL (ref 3.5–5.2)
BILIRUBIN DIRECT: 0 mg/dL (ref 0.0–0.3)
Total Bilirubin: 0.4 mg/dL (ref 0.3–1.2)
Total Protein: 7.1 g/dL (ref 6.0–8.3)

## 2013-03-18 LAB — LIPID PANEL
CHOL/HDL RATIO: 4
Cholesterol: 204 mg/dL — ABNORMAL HIGH (ref 0–200)
HDL: 46.7 mg/dL (ref >39.00)
Triglycerides: 166 mg/dL — ABNORMAL HIGH (ref 0.0–149.0)
VLDL: 33.2 mg/dL (ref 0.0–40.0)

## 2013-03-18 LAB — LDL CHOLESTEROL, DIRECT: LDL DIRECT: 140.8 mg/dL

## 2013-03-18 LAB — H. PYLORI ANTIBODY, IGG: H Pylori IgG: NEGATIVE

## 2013-03-18 LAB — VITAMIN B12: VITAMIN B 12: 291 pg/mL (ref 211–911)

## 2013-03-18 MED ORDER — CLOBETASOL PROPIONATE 0.05 % EX OINT: 1.0000 "application " | TOPICAL_OINTMENT | Freq: Two times a day (BID) | CUTANEOUS | Status: DC

## 2013-03-18 NOTE — Assessment & Plan Note (Addendum)
[place] abd -- stop celebrex con't nexium F/u GI Check labs

## 2013-03-18 NOTE — Patient Instructions (Signed)
Preventive Care for Adults, Female A healthy lifestyle and preventive care can promote health and wellness. Preventive health guidelines for women include the following key practices.  A routine yearly physical is a good way to check with your health care provider about your health and preventive screening. It is a chance to share any concerns and updates on your health and to receive a thorough exam.  Visit your dentist for a routine exam and preventive care every 6 months. Brush your teeth twice a day and floss once a day. Good oral hygiene prevents tooth decay and gum disease.  The frequency of eye exams is based on your age, health, family medical history, use of contact lenses, and other factors. Follow your health care provider's recommendations for frequency of eye exams.  Eat a healthy diet. Foods like vegetables, fruits, whole grains, low-fat dairy products, and lean protein foods contain the nutrients you need without too many calories. Decrease your intake of foods high in solid fats, added sugars, and salt. Eat the right amount of calories for you.Get information about a proper diet from your health care provider, if necessary.  Regular physical exercise is one of the most important things you can do for your health. Most adults should get at least 150 minutes of moderate-intensity exercise (any activity that increases your heart rate and causes you to sweat) each week. In addition, most adults need muscle-strengthening exercises on 2 or more days a week.  Maintain a healthy weight. The body mass index (BMI) is a screening tool to identify possible weight problems. It provides an estimate of body fat based on height and weight. Your health care provider can find your BMI, and can help you achieve or maintain a healthy weight.For adults 20 years and older:  A BMI below 18.5 is considered underweight.  A BMI of 18.5 to 24.9 is normal.  A BMI of 25 to 29.9 is considered overweight.  A  BMI of 30 and above is considered obese.  Maintain normal blood lipids and cholesterol levels by exercising and minimizing your intake of saturated fat. Eat a balanced diet with plenty of fruit and vegetables. Blood tests for lipids and cholesterol should begin at age 68 and be repeated every 5 years. If your lipid or cholesterol levels are high, you are over 50, or you are at high risk for heart disease, you may need your cholesterol levels checked more frequently.Ongoing high lipid and cholesterol levels should be treated with medicines if diet and exercise are not working.  If you smoke, find out from your health care provider how to quit. If you do not use tobacco, do not start.  Lung cancer screening is recommended for adults aged 68 80 years who are at high risk for developing lung cancer because of a history of smoking. A yearly low-dose CT scan of the lungs is recommended for people who have at least a 30-pack-year history of smoking and are a current smoker or have quit within the past 15 years. A pack year of smoking is smoking an average of 1 pack of cigarettes a day for 1 year (for example: 1 pack a day for 30 years or 2 packs a day for 15 years). Yearly screening should continue until the smoker has stopped smoking for at least 15 years. Yearly screening should be stopped for people who develop a health problem that would prevent them from having lung cancer treatment.  If you are pregnant, do not drink alcohol. If you  are breastfeeding, be very cautious about drinking alcohol. If you are not pregnant and choose to drink alcohol, do not have more than 1 drink per day. One drink is considered to be 12 ounces (355 mL) of beer, 5 ounces (148 mL) of wine, or 1.5 ounces (44 mL) of liquor.  Avoid use of street drugs. Do not share needles with anyone. Ask for help if you need support or instructions about stopping the use of drugs.  High blood pressure causes heart disease and increases the risk  of stroke. Your blood pressure should be checked at least every 1 to 2 years. Ongoing high blood pressure should be treated with medicines if weight loss and exercise do not work.  If you are 68 68 years old, ask your health care provider if you should take aspirin to prevent strokes.  Diabetes screening involves taking a blood sample to check your fasting blood sugar level. This should be done once every 3 years, after age 68, if you are within normal weight and without risk factors for diabetes. Testing should be considered at a younger age or be carried out more frequently if you are overweight and have at least 1 risk factor for diabetes.  Breast cancer screening is essential preventive care for women. You should practice "breast self-awareness." This means understanding the normal appearance and feel of your breasts and may include breast self-examination. Any changes detected, no matter how small, should be reported to a health care provider. Women in their 40s and 30s should have a clinical breast exam (CBE) by a health care provider as part of a regular health exam every 1 to 3 years. After age 68, women should have a CBE every year. Starting at age 68, women should consider having a mammogram (breast X-ray test) every year. Women who have a family history of breast cancer should talk to their health care provider about genetic screening. Women at a high risk of breast cancer should talk to their health care providers about having an MRI and a mammogram every year.  Breast cancer gene (BRCA)-related cancer risk assessment is recommended for women who have family members with BRCA-related cancers. BRCA-related cancers include breast, ovarian, tubal, and peritoneal cancers. Having family members with these cancers may be associated with an increased risk for harmful changes (mutations) in the breast cancer genes BRCA1 and BRCA2. Results of the assessment will determine the need for genetic counseling  and BRCA1 and BRCA2 testing.  The Pap test is a screening test for cervical cancer. A Pap test can show cell changes on the cervix that might become cervical cancer if left untreated. A Pap test is a procedure in which cells are obtained and examined from the lower end of the uterus (cervix).  Women should have a Pap test starting at age 59.  Between ages 42 and 13, Pap tests should be repeated every 2 years.  Beginning at age 53, you should have a Pap test every 3 years as long as the past 3 Pap tests have been normal.  Some women have medical problems that increase the chance of getting cervical cancer. Talk to your health care provider about these problems. It is especially important to talk to your health care provider if a new problem develops soon after your last Pap test. In these cases, your health care provider may recommend more frequent screening and Pap tests.  The above recommendations are the same for women who have or have not gotten the vaccine  for human papillomavirus (HPV).  If you had a hysterectomy for a problem that was not cancer or a condition that could lead to cancer, then you no longer need Pap tests. Even if you no longer need a Pap test, a regular exam is a good idea to make sure no other problems are starting.  If you are between ages 58 and 10 years, and you have had normal Pap tests going back 10 years, you no longer need Pap tests. Even if you no longer need a Pap test, a regular exam is a good idea to make sure no other problems are starting.  If you have had past treatment for cervical cancer or a condition that could lead to cancer, you need Pap tests and screening for cancer for at least 20 years after your treatment.  If Pap tests have been discontinued, risk factors (such as a new sexual partner) need to be reassessed to determine if screening should be resumed.  The HPV test is an additional test that may be used for cervical cancer screening. The HPV test  looks for the virus that can cause the cell changes on the cervix. The cells collected during the Pap test can be tested for HPV. The HPV test could be used to screen women aged 67 years and older, and should be used in women of any age who have unclear Pap test results. After the age of 65, women should have HPV testing at the same frequency as a Pap test.  Colorectal cancer can be detected and often prevented. Most routine colorectal cancer screening begins at the age of 25 years and continues through age 66 years. However, your health care provider may recommend screening at an earlier age if you have risk factors for colon cancer. On a yearly basis, your health care provider may provide home test kits to check for hidden blood in the stool. Use of a small camera at the end of a tube, to directly examine the colon (sigmoidoscopy or colonoscopy), can detect the earliest forms of colorectal cancer. Talk to your health care provider about this at age 79, when routine screening begins. Direct exam of the colon should be repeated every 5 10 years through age 47 years, unless early forms of pre-cancerous polyps or small growths are found.  People who are at an increased risk for hepatitis B should be screened for this virus. You are considered at high risk for hepatitis B if:  You were born in a country where hepatitis B occurs often. Talk with your health care provider about which countries are considered high risk.  Your parents were born in a high-risk country and you have not received a shot to protect against hepatitis B (hepatitis B vaccine).  You have HIV or AIDS.  You use needles to inject street drugs.  You live with, or have sex with, someone who has Hepatitis B.  You get hemodialysis treatment.  You take certain medicines for conditions like cancer, organ transplantation, and autoimmune conditions.  Hepatitis C blood testing is recommended for all people born from [birth year] through 1965 and  any individual with known risks for hepatitis C.  Practice safe sex. Use condoms and avoid high-risk sexual practices to reduce the spread of sexually transmitted infections (STIs). STIs include gonorrhea, chlamydia, syphilis, trichomonas, herpes, HPV, and human immunodeficiency virus (HIV). Herpes, HIV, and HPV are viral illnesses that have no cure. They can result in disability, cancer, and death. Sexually active women aged 66  years and younger should be checked for chlamydia. Older women with new or multiple partners should also be tested for chlamydia. Testing for other STIs is recommended if you are sexually active and at increased risk.  Osteoporosis is a disease in which the bones lose minerals and strength with aging. This can result in serious bone fractures or breaks. The risk of osteoporosis can be identified using a bone density scan. Women ages 18 years and over and women at risk for fractures or osteoporosis should discuss screening with their health care providers. Ask your health care provider whether you should take a calcium supplement or vitamin D to reduce the rate of osteoporosis.  Menopause can be associated with physical symptoms and risks. Hormone replacement therapy is available to decrease symptoms and risks. You should talk to your health care provider about whether hormone replacement therapy is right for you.  Use sunscreen. Apply sunscreen liberally and repeatedly throughout the day. You should seek shade when your shadow is shorter than you. Protect yourself by wearing long sleeves, pants, a wide-brimmed hat, and sunglasses year round, whenever you are outdoors.  Once a month, do a whole body skin exam, using a mirror to look at the skin on your back. Tell your health care provider of new moles, moles that have irregular borders, moles that are larger than a pencil eraser, or moles that have changed in shape or color.  Stay current with required vaccines  (immunizations).  Influenza vaccine. All adults should be immunized every year.  Tetanus, diphtheria, and acellular pertussis (Td, Tdap) vaccine. Pregnant women should receive 1 dose of Tdap vaccine during each pregnancy. The dose should be obtained regardless of the length of time since the last dose. Immunization is preferred during the 27th 36th week of gestation. An adult who has not previously received Tdap or who does not know her vaccine status should receive 1 dose of Tdap. This initial dose should be followed by tetanus and diphtheria toxoids (Td) booster doses every 10 years. Adults with an unknown or incomplete history of completing a 3-dose immunization series with Td-containing vaccines should begin or complete a primary immunization series including a Tdap dose. Adults should receive a Td booster every 10 years.  Varicella vaccine. An adult without evidence of immunity to varicella should receive 2 doses or a second dose if she has previously received 1 dose. Pregnant females who do not have evidence of immunity should receive the first dose after pregnancy. This first dose should be obtained before leaving the health care facility. The second dose should be obtained 4 8 weeks after the first dose.  Human papillomavirus (HPV) vaccine. Females aged 9 26 years who have not received the vaccine previously should obtain the 3-dose series. The vaccine is not recommended for use in pregnant females. However, pregnancy testing is not needed before receiving a dose. If a female is found to be pregnant after receiving a dose, no treatment is needed. In that case, the remaining doses should be delayed until after the pregnancy. Immunization is recommended for any person with an immunocompromised condition through the age of 51 years if she did not get any or all doses earlier. During the 3-dose series, the second dose should be obtained 4 8 weeks after the first dose. The third dose should be obtained  24 weeks after the first dose and 16 weeks after the second dose.  Zoster vaccine. One dose is recommended for adults aged 57 years or older unless certain  conditions are present.  Measles, mumps, and rubella (MMR) vaccine. Adults born before [birth year] generally are considered immune to measles and mumps. Adults born in [birth year] or later should have 1 or more doses of MMR vaccine unless there is a contraindication to the vaccine or there is laboratory evidence of immunity to each of the three diseases. A routine second dose of MMR vaccine should be obtained at least 28 days after the first dose for students attending postsecondary schools, health care workers, or international travelers. People who received inactivated measles vaccine or an unknown type of measles vaccine during 1963 1967 should receive 2 doses of MMR vaccine. People who received inactivated mumps vaccine or an unknown type of mumps vaccine before 1979 and are at high risk for mumps infection should consider immunization with 2 doses of MMR vaccine. For females of childbearing age, rubella immunity should be determined. If there is no evidence of immunity, females who are not pregnant should be vaccinated. If there is no evidence of immunity, females who are pregnant should delay immunization until after pregnancy. Unvaccinated health care workers born before [birth year] who lack laboratory evidence of measles, mumps, or rubella immunity or laboratory confirmation of disease should consider measles and mumps immunization with 2 doses of MMR vaccine or rubella immunization with 1 dose of MMR vaccine.  Pneumococcal 13-valent conjugate (PCV13) vaccine. When indicated, a person who is uncertain of her immunization history and has no record of immunization should receive the PCV13 vaccine. An adult aged 42 years or older who has certain medical conditions and has not been previously immunized should receive 1 dose of PCV13 vaccine. This PCV13 should be followed  with a dose of pneumococcal polysaccharide (PPSV23) vaccine. The PPSV23 vaccine dose should be obtained at least 8 weeks after the dose of PCV13 vaccine. An adult aged 4 years or older who has certain medical conditions and previously received 1 or more doses of PPSV23 vaccine should receive 1 dose of PCV13. The PCV13 vaccine dose should be obtained 1 or more years after the last PPSV23 vaccine dose.  Pneumococcal polysaccharide (PPSV23) vaccine. When PCV13 is also indicated, PCV13 should be obtained first. All adults aged 27 years and older should be immunized. An adult younger than age 33 years who has certain medical conditions should be immunized. Any person who resides in a nursing home or long-term care facility should be immunized. An adult smoker should be immunized. People with an immunocompromised condition and certain other conditions should receive both PCV13 and PPSV23 vaccines. People with human immunodeficiency virus (HIV) infection should be immunized as soon as possible after diagnosis. Immunization during chemotherapy or radiation therapy should be avoided. Routine use of PPSV23 vaccine is not recommended for American Indians, [place] Natives, or people younger than 65 years unless there are medical conditions that require PPSV23 vaccine. When indicated, people who have unknown immunization and have no record of immunization should receive PPSV23 vaccine. One-time revaccination 5 years after the first dose of PPSV23 is recommended for people aged 13 64 years who have chronic kidney failure, nephrotic syndrome, asplenia, or immunocompromised conditions. People who received 1 2 doses of PPSV23 before age 66 years should receive another dose of PPSV23 vaccine at age 27 years or later if at least 5 years have passed since the previous dose. Doses of PPSV23 are not needed for people immunized with PPSV23 at or after age 33 years.  Meningococcal vaccine. Adults with asplenia or persistent complement  component deficiencies should receive 2  doses of quadrivalent meningococcal conjugate (MenACWY-D) vaccine. The doses should be obtained at least 2 months apart. Microbiologists working with certain meningococcal bacteria, [place] recruits, people at risk during an outbreak, and people who travel to or live in countries with a high rate of meningitis should be immunized. A first-year college student up through age 49 years who is living in a residence hall should receive a dose if she did not receive a dose on or after her 16th birthday. Adults who have certain high-risk conditions should receive one or more doses of vaccine.  Hepatitis A vaccine. Adults who wish to be protected from this disease, have certain high-risk conditions, work with hepatitis A-infected animals, work in hepatitis A research labs, or travel to or work in countries with a high rate of hepatitis A should be immunized. Adults who were previously unvaccinated and who anticipate close contact with an international adoptee during the first 60 days after arrival in the [place] from a country with a high rate of hepatitis A should be immunized.  Hepatitis B vaccine. Adults who wish to be protected from this disease, have certain high-risk conditions, may be exposed to blood or other infectious body fluids, are household contacts or sex partners of hepatitis B positive people, are clients or workers in certain care facilities, or travel to or work in countries with a high rate of hepatitis B should be immunized.  Haemophilus influenzae type b (Hib) vaccine. A previously unvaccinated person with asplenia or sickle cell disease or having a scheduled splenectomy should receive 1 dose of Hib vaccine. Regardless of previous immunization, a recipient of a hematopoietic stem cell transplant should receive a 3-dose series 6 12 months after her successful transplant. Hib vaccine is not recommended for adults with HIV infection. Preventive  Services / Frequency Ages 24 to 39years  Blood pressure check.** / Every 1 to 2 years.  Lipid and cholesterol check.** / Every 5 years beginning at age 66.  Clinical breast exam.** / Every 3 years for women in their 12s and 24s.  BRCA-related cancer risk assessment.** / For women who have family members with a BRCA-related cancer (breast, ovarian, tubal, or peritoneal cancers).  Pap test.** / Every 2 years from ages 31 through 69. Every 3 years starting at age 64 through age 76 or 89 with a history of 3 consecutive normal Pap tests.  HPV screening.** / Every 3 years from ages 10 through ages 10 to 96 with a history of 3 consecutive normal Pap tests.  Hepatitis C blood test.** / For any individual with known risks for hepatitis C.  Skin self-exam. / Monthly.  Influenza vaccine. / Every year.  Tetanus, diphtheria, and acellular pertussis (Tdap, Td) vaccine.** / Consult your health care provider. Pregnant women should receive 1 dose of Tdap vaccine during each pregnancy. 1 dose of Td every 10 years.  Varicella vaccine.** / Consult your health care provider. Pregnant females who do not have evidence of immunity should receive the first dose after pregnancy.  HPV vaccine. / 3 doses over 6 months, if 90 and younger. The vaccine is not recommended for use in pregnant females. However, pregnancy testing is not needed before receiving a dose.  Measles, mumps, rubella (MMR) vaccine.** / You need at least 1 dose of MMR if you were born in 1957 or later. You may also need a 2nd dose. For females of childbearing age, rubella immunity should be determined. If there is no evidence of immunity, females who are not  pregnant should be vaccinated. If there is no evidence of immunity, females who are pregnant should delay immunization until after pregnancy.  Pneumococcal 13-valent conjugate (PCV13) vaccine.** / Consult your health care provider.  Pneumococcal polysaccharide (PPSV23) vaccine.** / 1 to 2  doses if you smoke cigarettes or if you have certain conditions.  Meningococcal vaccine.** / 1 dose if you are age 88 to 6 years and a Market researcher living in a residence hall, or have one of several medical conditions, you need to get vaccinated against meningococcal disease. You may also need additional booster doses.  Hepatitis A vaccine.** / Consult your health care provider.  Hepatitis B vaccine.** / Consult your health care provider.  Haemophilus influenzae type b (Hib) vaccine.** / Consult your health care provider. Ages 23 to 64years  Blood pressure check.** / Every 1 to 2 years.  Lipid and cholesterol check.** / Every 5 years beginning at age 20 years.  Lung cancer screening. / Every year if you are aged 51 80 years and have a 30-pack-year history of smoking and currently smoke or have quit within the past 15 years. Yearly screening is stopped once you have quit smoking for at least 15 years or develop a health problem that would prevent you from having lung cancer treatment.  Clinical breast exam.** / Every year after age 8 years.  BRCA-related cancer risk assessment.** / For women who have family members with a BRCA-related cancer (breast, ovarian, tubal, or peritoneal cancers).  Mammogram.** / Every year beginning at age 10 years and continuing for as long as you are in good health. Consult with your health care provider.  Pap test.** / Every 3 years starting at age 30 years through age 5 or 61 years with a history of 3 consecutive normal Pap tests.  HPV screening.** / Every 3 years from ages 39 years through ages 72 to 19 years with a history of 3 consecutive normal Pap tests.  Fecal occult blood test (FOBT) of stool. / Every year beginning at age 59 years and continuing until age 27 years. You may not need to do this test if you get a colonoscopy every 10 years.  Flexible sigmoidoscopy or colonoscopy.** / Every 5 years for a flexible sigmoidoscopy or every  10 years for a colonoscopy beginning at age 110 years and continuing until age 63 years.  Hepatitis C blood test.** / For all people born from [birth year] through 1965 and any individual with known risks for hepatitis C.  Skin self-exam. / Monthly.  Influenza vaccine. / Every year.  Tetanus, diphtheria, and acellular pertussis (Tdap/Td) vaccine.** / Consult your health care provider. Pregnant women should receive 1 dose of Tdap vaccine during each pregnancy. 1 dose of Td every 10 years.  Varicella vaccine.** / Consult your health care provider. Pregnant females who do not have evidence of immunity should receive the first dose after pregnancy.  Zoster vaccine.** / 1 dose for adults aged 46 years or older.  Measles, mumps, rubella (MMR) vaccine.** / You need at least 1 dose of MMR if you were born in 1957 or later. You may also need a 2nd dose. For females of childbearing age, rubella immunity should be determined. If there is no evidence of immunity, females who are not pregnant should be vaccinated. If there is no evidence of immunity, females who are pregnant should delay immunization until after pregnancy.  Pneumococcal 13-valent conjugate (PCV13) vaccine.** / Consult your health care provider.  Pneumococcal polysaccharide (PPSV23) vaccine.** / 1  to 2 doses if you smoke cigarettes or if you have certain conditions.  Meningococcal vaccine.** / Consult your health care provider.  Hepatitis A vaccine.** / Consult your health care provider.  Hepatitis B vaccine.** / Consult your health care provider.  Haemophilus influenzae type b (Hib) vaccine.** / Consult your health care provider. Ages 32 years and over  Blood pressure check.** / Every 1 to 2 years.  Lipid and cholesterol check.** / Every 5 years beginning at age 53 years.  Lung cancer screening. / Every year if you are aged 77 80 years and have a 30-pack-year history of smoking and currently smoke or have quit within the past 15 years.  Yearly screening is stopped once you have quit smoking for at least 15 years or develop a health problem that would prevent you from having lung cancer treatment.  Clinical breast exam.** / Every year after age 73 years.  BRCA-related cancer risk assessment.** / For women who have family members with a BRCA-related cancer (breast, ovarian, tubal, or peritoneal cancers).  Mammogram.** / Every year beginning at age 17 years and continuing for as long as you are in good health. Consult with your health care provider.  Pap test.** / Every 3 years starting at age 17 years through age 43 or 42 years with 3 consecutive normal Pap tests. Testing can be stopped between 65 and 70 years with 3 consecutive normal Pap tests and no abnormal Pap or HPV tests in the past 10 years.  HPV screening.** / Every 3 years from ages 69 years through ages 59 or 53 years with a history of 3 consecutive normal Pap tests. Testing can be stopped between 65 and 70 years with 3 consecutive normal Pap tests and no abnormal Pap or HPV tests in the past 10 years.  Fecal occult blood test (FOBT) of stool. / Every year beginning at age 71 years and continuing until age 64 years. You may not need to do this test if you get a colonoscopy every 10 years.  Flexible sigmoidoscopy or colonoscopy.** / Every 5 years for a flexible sigmoidoscopy or every 10 years for a colonoscopy beginning at age 28 years and continuing until age 25 years.  Hepatitis C blood test.** / For all people born from [birth year] through 1965 and any individual with known risks for hepatitis C.  Osteoporosis screening.** / A one-time screening for women ages 72 years and over and women at risk for fractures or osteoporosis.  Skin self-exam. / Monthly.  Influenza vaccine. / Every year.  Tetanus, diphtheria, and acellular pertussis (Tdap/Td) vaccine.** / 1 dose of Td every 10 years.  Varicella vaccine.** / Consult your health care provider.  Zoster vaccine.** / 1  dose for adults aged 45 years or older.  Pneumococcal 13-valent conjugate (PCV13) vaccine.** / Consult your health care provider.  Pneumococcal polysaccharide (PPSV23) vaccine.** / 1 dose for all adults aged 28 years and older.  Meningococcal vaccine.** / Consult your health care provider.  Hepatitis A vaccine.** / Consult your health care provider.  Hepatitis B vaccine.** / Consult your health care provider.  Haemophilus influenzae type b (Hib) vaccine.** / Consult your health care provider. ** Family history and personal history of risk and conditions may change your health care provider's recommendations. Document Released: 03/28/2001 Document Revised: 11/20/2012 Document Reviewed: 06/27/2010 [facility] Patient Information 2014 [place], [place].

## 2013-03-18 NOTE — Assessment & Plan Note (Signed)
con't meds  Check labs 

## 2013-03-18 NOTE — Assessment & Plan Note (Signed)
Inc pain since celebrex started--- pt d/c'd celebrex and has f/u GI

## 2013-03-18 NOTE — Progress Notes (Signed)
Subjective:    Connie Owens is a 68 y.o. female who presents for Medicare Annual/Subsequent preventive examination.  Preventive Screening-Counseling & Management  Tobacco History  Smoking status  . Never Smoker   Smokeless tobacco  . Never Used     Problems Prior to Visit 1. Chronic L side pain-- worsened with celebrex  Current Problems (verified) Patient Active Problem List   Diagnosis Date Noted  . Obesity (BMI 30-39.9) 03/18/2013  . ERRONEOUS ENCOUNTER--DISREGARD 03/06/2013  . Internal hemorrhoids with other complication 0000000  . Nonspecific abnormal finding in stool contents 11/05/2012  . Abdominal pain, left upper quadrant 11/05/2012  . Diffuse pain 07/24/2012  . Migraine headache without aura 07/23/2012  . Headache, paroxysmal hemicrania, chronic 07/23/2012  . OSA (obstructive sleep apnea) 06/11/2012  . NSTEMI (non-ST elevated myocardial infarction) 09/28/2011  . IBS (irritable bowel syndrome) 03/22/2011  . Hyperlipidemia 12/21/2010  . DIARRHEA, CHRONIC 01/13/2010  . COCCYGEAL PAIN 05/11/2009  . Anorectal pain 02/02/2009  . HIP PAIN, LEFT 02/02/2009  . CORNEAL DISORDER 12/11/2008  . POSTMENOPAUSAL STATUS 12/11/2008  . ADVERSE DRUG REACTION 12/11/2008  . DYSPHAGIA UNSPECIFIED 10/02/2008  . BACK PAIN 07/15/2008  . Dysmetabolic syndrome X [id number]  . OSTEOARTHRITIS, KNEE, RIGHT 06/19/2007  . CARPAL TUNNEL SYNDROME 04/25/2007  . ADENOMATOUS COLONIC POLYP 04/23/2007  . GERD 04/23/2007  . DIVERTICULAR DISEASE 04/23/2007  . HIATAL HERNIA 11/16/2006  . ANXIETY STATE NOS 11/12/2006  . HYPERTENSION, ESSENTIAL NOS 10/30/2006    Medications Prior to Visit Current Outpatient Prescriptions on File Prior to Visit  Medication Sig Dispense Refill  . brimonidine-timolol (COMBIGAN) 0.2-0.5 % ophthalmic solution Place 1 drop into the left eye 2 (two) times daily.      . Carboxymethylcellulose Sodium (LUBRICANT EYE DROPS OP) Apply 1 drop to eye daily as needed.  For dry or itchy eyes.       . carvedilol (COREG) 3.125 MG tablet Take 1 tablet (3.125 mg total) by mouth 2 (two) times daily.  180 tablet  3  . carvedilol (COREG) 6.25 MG tablet Take 1 tablet (6.25 mg total) by mouth 2 (two) times daily.  180 tablet  3  . cholecalciferol (VITAMIN D) 1000 UNITS tablet Take 2,000 Units by mouth daily.      . clopidogrel (PLAVIX) 75 MG tablet Take 1 tablet (75 mg total) by mouth daily with breakfast.  90 tablet  3  . esomeprazole (NEXIUM) 40 MG capsule Take 40 mg by mouth daily before breakfast.      . estradiol (ESTRACE) 1 MG tablet Take 1 mg by mouth daily.        [name] gabapentin (NEURONTIN) 100 MG capsule Take 1 capsule (100 mg total) by mouth every 8 (eight) hours as needed. For pain Office visit due  270 capsule  0  . loperamide (IMODIUM) 2 MG capsule Take 2 mg by mouth 4 (four) times daily as needed. For loose stool      . losartan (COZAAR) 50 MG tablet Take 1 tablet (50 mg total) by mouth daily. 1 by mouth once daily  90 tablet  3  . nitroGLYCERIN (NITROSTAT) 0.4 MG SL tablet Place 1 tablet (0.4 mg total) under the tongue every 5 (five) minutes x 3 doses as needed for chest pain.  30 tablet  6  . pravastatin (PRAVACHOL) 80 MG tablet Take 1 tablet (80 mg total) by mouth every evening.  90 tablet  3  . prednisoLONE acetate (PRED FORTE) 1 % ophthalmic suspension Place 1 drop into both eyes  2 (two) times daily.       . psyllium (METAMUCIL) 58.6 % powder Take 1 packet by mouth 3 (three) times daily as needed. For regularity      . zolpidem (AMBIEN) 10 MG tablet Take 1 tablet (10 mg total) by mouth at bedtime.  90 tablet  1   Current Facility-Administered Medications on File Prior to Visit  Medication Dose Route Frequency Provider Last Rate Last Dose  . botulinum toxin Type A (BOTOX) injection 150 Units  150 Units Intramuscular Once Marcial Pacas, MD        Current Medications (verified) Current Outpatient Prescriptions  Medication Sig Dispense Refill  .  brimonidine-timolol (COMBIGAN) 0.2-0.5 % ophthalmic solution Place 1 drop into the left eye 2 (two) times daily.      . Carboxymethylcellulose Sodium (LUBRICANT EYE DROPS OP) Apply 1 drop to eye daily as needed. For dry or itchy eyes.       . carvedilol (COREG) 3.125 MG tablet Take 1 tablet (3.125 mg total) by mouth 2 (two) times daily.  180 tablet  3  . carvedilol (COREG) 6.25 MG tablet Take 1 tablet (6.25 mg total) by mouth 2 (two) times daily.  180 tablet  3  . cholecalciferol (VITAMIN D) 1000 UNITS tablet Take 2,000 Units by mouth daily.      . clobetasol ointment (TEMOVATE) [id number] % Apply 1 application topically 2 (two) times daily.      . clopidogrel (PLAVIX) 75 MG tablet Take 1 tablet (75 mg total) by mouth daily with breakfast.  90 tablet  3  . esomeprazole (NEXIUM) 40 MG capsule Take 40 mg by mouth daily before breakfast.      . estradiol (ESTRACE) 1 MG tablet Take 1 mg by mouth daily.        [name] gabapentin (NEURONTIN) 100 MG capsule Take 1 capsule (100 mg total) by mouth every 8 (eight) hours as needed. For pain Office visit due  270 capsule  0  . loperamide (IMODIUM) 2 MG capsule Take 2 mg by mouth 4 (four) times daily as needed. For loose stool      . losartan (COZAAR) 50 MG tablet Take 1 tablet (50 mg total) by mouth daily. 1 by mouth once daily  90 tablet  3  . nitroGLYCERIN (NITROSTAT) 0.4 MG SL tablet Place 1 tablet (0.4 mg total) under the tongue every 5 (five) minutes x 3 doses as needed for chest pain.  30 tablet  6  . pravastatin (PRAVACHOL) 80 MG tablet Take 1 tablet (80 mg total) by mouth every evening.  90 tablet  3  . prednisoLONE acetate (PRED FORTE) 1 % ophthalmic suspension Place 1 drop into both eyes 2 (two) times daily.       . psyllium (METAMUCIL) 58.6 % powder Take 1 packet by mouth 3 (three) times daily as needed. For regularity      . zolpidem (AMBIEN) 10 MG tablet Take 1 tablet (10 mg total) by mouth at bedtime.  90 tablet  1   Current Facility-Administered Medications   Medication Dose Route Frequency Provider Last Rate Last Dose  . botulinum toxin Type A (BOTOX) injection 150 Units  150 Units Intramuscular Once Marcial Pacas, MD         Allergies (verified) Iohexol; Iodine; Latex; Tape; Imdur; and Verapamil   PAST HISTORY  Family History Family History  Problem Relation Age of Onset  . Breast cancer Mother   . Heart disease Father   . Stroke Father   .  Dementia Father   . Prostate cancer Father   . Diabetes Sister     3 sisters   . Heart disease Sister     1 sister CABG  . Breast cancer Sister     PTE pre & post Dx of ca  . Diabetes Paternal Grandfather   . Colon cancer Neg Hx     Social History History  Substance Use Topics  . Smoking status: Never Smoker   . Smokeless tobacco: Never Used  . Alcohol Use: Yes     Comment: rare     Are there smokers in your home (other than you)? No  Risk Factors Current exercise habits: walk on treadmill-- 3x a week  Dietary issues discussed: na   Cardiac risk factors: advanced age (older than 35 for men, 39 for women), dyslipidemia, family history of premature cardiovascular disease, hypertension, obesity (BMI >= 30 kg/m2) and sedentary lifestyle.  Depression Screen (Note: if answer to either of the following is "Yes", a more complete depression screening is indicated)   Over the past two weeks, have you felt down, depressed or hopeless? No  Over the past two weeks, have you felt little interest or pleasure in doing things? No  Have you lost interest or pleasure in daily life? No  Do you often feel hopeless? No  Do you cry easily over simple problems? No  Activities of Daily Living In your present state of health, do you have any difficulty performing the following activities?:  Driving? No Managing money?  No Feeding yourself? No Getting from bed to chair? No Climbing a flight of stairs? No Preparing food and eating?: No Bathing or showering? No Getting dressed: No Getting to the  toilet? No Using the toilet:No Moving around from place to place: No In the past year have you fallen or had a near fall?:No   Are you sexually active?  Yes  Do you have more than one partner?  No  Hearing Difficulties: No Do you often ask people to speak up or repeat themselves? No Do you experience ringing or noises in your ears? No Do you have difficulty understanding soft or whispered voices? No   Do you feel that you have a problem with memory? No  Do you often misplace items? No  Do you feel safe at home?  Yes  Cognitive Testing  Alert? Yes  Normal Appearance?Yes  Oriented to person? Yes  Place? Yes   Time? Yes  Recall of three objects?  Yes  Can perform simple calculations? Yes  Displays appropriate judgment?Yes  Can read the correct time from a watch face?Yes   Advanced Directives have been discussed with the patient? Yes  List the Names of Other Physician/Practitioners you currently use: 1.  Hochrein-- card 2   [facility]- eye 3.  doehmier--neuro, cpap  4,  Kaplan-- GI  5   Ortho--- gioffrey  6  Dentist-- [facility] 7  Gyn-- horvath     Indicate any recent Medical Services you may have received from other than [facility] providers in the past year (date may be approximate).  Immunization History  Administered Date(s) Administered  . Influenza Split 11/16/2010, 11/07/2011  . Influenza Whole 11/06/2007, 12/11/2008, 11/16/2009  . Influenza, High Dose Seasonal PF 11/19/2012  . Pneumococcal Polysaccharide-23 09/29/2011  . Zoster 12/31/2012    Screening Tests Health Maintenance  Topic Date Due  . Tetanus/tdap  04/21/1964  . Influenza Vaccine  09/13/2013  . Mammogram  12/17/2014  . Colonoscopy  01/22/2020  .  Pneumococcal Polysaccharide Vaccine Age 72 And Over  Completed  . Zostavax  Completed    All answers were reviewed with the patient and necessary referrals were made:  Garnet Koyanagi, DO   03/18/2013   History reviewed:  She  has a past medical history of CAD  (coronary artery disease); Ischemic cardiomyopathy; Corneal disorder; Postmenopausal status; Back pain; Dysmetabolic syndrome X; Arthritis; Carpal tunnel syndrome; GERD (gastroesophageal reflux disease); Diverticular disease; adenomatous colonic polyps; Hernia; Anxiety state; Hypertension; Shoulder fracture; HLD (hyperlipidemia); Migraine headache without aura (07/23/2012); Headache, paroxysmal hemicrania, chronic (07/23/2012); and Heart attack (09/2011). She  does not have any pertinent problems on file. 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 Her family history includes Breast cancer in her mother and sister; Dementia in her father; Diabetes in her paternal grandfather and sister; Heart disease in her father and sister; Prostate cancer in her father; Stroke in her father. There is no history of Colon cancer. She  reports that she has never smoked. She has never used smokeless tobacco. She reports that she drinks alcohol. She reports that she does not use illicit drugs. She has a current medication list which includes the following prescription(s): brimonidine-timolol, carboxymethylcellulose sodium, carvedilol, carvedilol, cholecalciferol, clobetasol ointment, clopidogrel, esomeprazole, estradiol, gabapentin, loperamide, losartan, nitroglycerin, pravastatin, prednisolone acetate, psyllium, and zolpidem, and the following Facility-Administered Medications: botulinum toxin type a. Current Outpatient Prescriptions on File Prior to Visit  Medication Sig Dispense Refill  . brimonidine-timolol  (COMBIGAN) 0.2-0.5 % ophthalmic solution Place 1 drop into the left eye 2 (two) times daily.      . Carboxymethylcellulose Sodium (LUBRICANT EYE DROPS OP) Apply 1 drop to eye daily as needed. For dry or itchy eyes.       . carvedilol (COREG) 3.125 MG tablet Take 1 tablet (3.125 mg total) by mouth 2 (two) times daily.  180 tablet  3  . carvedilol (COREG) 6.25 MG tablet Take 1 tablet (6.25 mg total) by mouth 2 (two) times daily.  180 tablet  3  . cholecalciferol (VITAMIN D) 1000 UNITS tablet Take 2,000 Units by mouth daily.      . clopidogrel (PLAVIX) 75 MG tablet Take 1 tablet (75 mg total) by mouth daily with breakfast.  90 tablet  3  . esomeprazole (NEXIUM) 40 MG capsule Take 40 mg by mouth daily before breakfast.      . estradiol (ESTRACE) 1 MG tablet Take 1 mg by mouth daily.        [name] gabapentin (NEURONTIN) 100 MG capsule Take 1 capsule (100 mg total) by mouth every 8 (eight) hours as needed. For pain Office visit due  270 capsule  0  . loperamide (IMODIUM) 2 MG capsule Take 2 mg by mouth 4 (four) times daily as needed. For loose stool      . losartan (COZAAR) 50 MG tablet Take 1 tablet (50 mg total) by mouth daily. 1 by mouth once daily  90 tablet  3  . nitroGLYCERIN (NITROSTAT) 0.4 MG SL tablet Place 1 tablet (0.4 mg total) under the tongue every 5 (five) minutes x 3 doses as needed for chest pain.  30 tablet  6  . pravastatin (PRAVACHOL) 80 MG tablet Take 1 tablet (80 mg total) by mouth every  evening.  90 tablet  3  . prednisoLONE acetate (PRED FORTE) 1 % ophthalmic suspension Place 1 drop into both eyes 2 (two) times daily.       . psyllium (METAMUCIL) 58.6 % powder Take 1 packet by mouth 3 (three) times daily as needed. For regularity      . zolpidem (AMBIEN) 10 MG tablet Take 1 tablet (10 mg total) by mouth at bedtime.  90 tablet  1   Current Facility-Administered Medications on File Prior to Visit  Medication Dose Route Frequency Provider Last Rate Last Dose  . botulinum toxin Type A  (BOTOX) injection 150 Units  150 Units Intramuscular Once Marcial Pacas, MD       She is allergic to iohexol; iodine; latex; tape; imdur; and verapamil.  Review of Systems  Review of Systems  Constitutional: Negative for activity change, appetite change and fatigue.  HENT: Negative for hearing loss, congestion, tinnitus and ear discharge.   Eyes: Negative for visual disturbance (see optho q1y -- vision corrected to 20/20 with glasses).  Respiratory: Negative for cough, chest tightness and shortness of breath.   Cardiovascular: Negative for chest pain, palpitations and leg swelling.  Gastrointestinal: Negative for abdominal pain, diarrhea, constipation and abdominal distention.  Genitourinary: Negative for urgency, frequency, decreased urine volume and difficulty urinating.  Musculoskeletal: Negative for back pain, arthralgias and gait problem.  Skin: Negative for color change, pallor and rash.  Neurological: Negative for dizziness, light-headedness, numbness and headaches.  Hematological: Negative for adenopathy. Does not bruise/bleed easily.  Psychiatric/Behavioral: Negative for suicidal ideas, confusion, sleep disturbance, self-injury, dysphoric mood, decreased concentration and agitation.  Pt is able to read and write and can do all ADLs No risk for falling No abuse/ violence in home      Objective:     Vision by Snellen chart: opth  Body mass index is 36.51 kg/(m^2). BP 124/62  Pulse 58  Temp(Src) 98.1 F (36.7 C) (Oral)  Ht 5' 4.75" (1.645 m)  Wt 217 lb 12.8 oz (98.793 kg)  BMI 36.51 kg/m2  SpO2 98%  BP 124/62  Pulse 58  Temp(Src) 98.1 F (36.7 C) (Oral)  Ht 5' 4.75" (1.645 m)  Wt 217 lb 12.8 oz (98.793 kg)  BMI 36.51 kg/m2  SpO2 98% General appearance: alert, cooperative, appears stated age and no distress Head: Normocephalic, without obvious abnormality, atraumatic Eyes: negative findings: lids and lashes normal, conjunctivae and sclerae normal and pupils equal,  round, reactive to light and accomodation Ears: normal TM's and external ear canals both ears Nose: Nares normal. Septum midline. Mucosa normal. No drainage or sinus tenderness. Throat: lips, mucosa, and tongue normal; teeth and gums normal Neck: no adenopathy, no carotid bruit, no JVD, supple, symmetrical, trachea midline and thyroid not enlarged, symmetric, no tenderness/mass/nodules Back: symmetric, no curvature. ROM normal. No CVA tenderness. Lungs: clear to auscultation bilaterally Breasts: normal appearance, no masses or tenderness Heart: regular rate and rhythm, S1, S2 normal, no murmur, click, rub or gallop Abdomen: soft, non-tender; bowel sounds normal; no masses,  no organomegaly Pelvic: deferred-- gyn Extremities: extremities normal, atraumatic, no cyanosis or edema Pulses: 2+ and symmetric Skin: Skin color, texture, turgor normal. No rashes or lesions Lymph nodes: Cervical, supraclavicular, and axillary nodes normal. Neurologic: Alert and oriented X 3, normal strength and tone. Normal symmetric reflexes. Normal coordination and gait Psych-- no depression, no anxiety      Assessment:     cpe       Plan:     During the  course of the visit the patient was educated and counseled about appropriate screening and preventive services including:    Pneumococcal vaccine   Influenza vaccine  Screening mammography  Screening Pap smear and pelvic exam   Bone densitometry screening  Colorectal cancer screening  Diabetes screening  Glaucoma screening  Advanced directives: has NO advanced directive  - add't info requested. Referral to SW: no  Diet review for nutrition referral? Yes ____  Not Indicated __x_   Patient Instructions (the written plan) was given to the patient.  Medicare Attestation I have personally reviewed: The patient's medical and social history Their use of alcohol, tobacco or illicit drugs Their current medications and supplements The  patient's functional ability including ADLs,fall risks, home safety risks, cognitive, and hearing and visual impairment Diet and physical activities Evidence for depression or mood disorders  The patient's weight, height, BMI, and visual acuity have been recorded in the chart.  I have made referrals, counseling, and provided education to the patient based on review of the above and I have provided the patient with a written personalized care plan for preventive services.     Garnet Koyanagi, DO   03/18/2013

## 2013-03-18 NOTE — Assessment & Plan Note (Signed)
con't meds stable 

## 2013-03-18 NOTE — Assessment & Plan Note (Signed)
Check bmd 

## 2013-03-18 NOTE — Progress Notes (Signed)
Pre visit review using our clinic review tool, if applicable. No additional management support is needed unless otherwise documented below in the visit note. 

## 2013-03-19 ENCOUNTER — Ambulatory Visit (INDEPENDENT_AMBULATORY_CARE_PROVIDER_SITE_OTHER)
Admission: RE | Admit: 2013-03-19 | Discharge: 2013-03-19 | Disposition: A | Discharge status: Home / Self Care | Payer: Medicare Other | Source: Ambulatory Visit | Attending: Internal Medicine | Admitting: Internal Medicine

## 2013-03-19 ENCOUNTER — Telehealth: Payer: Self-pay | Admitting: Internal Medicine

## 2013-03-19 ENCOUNTER — Ambulatory Visit
Admission: RE | Admit: 2013-03-19 | Discharge: 2013-03-19 | Disposition: A | Discharge status: Home / Self Care | Payer: Medicare Other | Source: Ambulatory Visit | Attending: Family Medicine | Admitting: Family Medicine

## 2013-03-19 DIAGNOSIS — E2839 Other primary ovarian failure: Secondary | ICD-10-CM

## 2013-03-19 DIAGNOSIS — R1013 Epigastric pain: Principal | ICD-10-CM

## 2013-03-19 DIAGNOSIS — G8929 Other chronic pain: Secondary | ICD-10-CM

## 2013-03-19 NOTE — Telephone Encounter (Signed)
Relevant patient education assigned to patient using Emmi. ° °

## 2013-03-20 LAB — URINE CULTURE
Colony Count: NO GROWTH
ORGANISM ID, BACTERIA: NO GROWTH

## 2013-03-20 NOTE — Telephone Encounter (Signed)
Message copied by Ewing Schlein on Thu Mar 20, 2013  1:50 PM ------      Message from: Rosalita Chessman      Created: Wed Mar 19, 2013  1:09 PM       ? Liver disease--- CT abd pelvis with contrast ------

## 2013-03-20 NOTE — Telephone Encounter (Signed)
Patient has been made aware and the order has been placed.    KP 

## 2013-03-26 ENCOUNTER — Other Ambulatory Visit: Payer: Self-pay

## 2013-03-26 ENCOUNTER — Ambulatory Visit
Admission: RE | Admit: 2013-03-26 | Discharge: 2013-03-26 | Disposition: A | Discharge status: Home / Self Care | Payer: Medicare Other | Source: Ambulatory Visit | Attending: Family Medicine | Admitting: Family Medicine

## 2013-03-26 DIAGNOSIS — K7460 Unspecified cirrhosis of liver: Secondary | ICD-10-CM

## 2013-03-26 DIAGNOSIS — R935 Abnormal findings on diagnostic imaging of other abdominal regions, including retroperitoneum: Secondary | ICD-10-CM

## 2013-03-26 DIAGNOSIS — K449 Diaphragmatic hernia without obstruction or gangrene: Secondary | ICD-10-CM | POA: Diagnosis not present

## 2013-03-26 MED ORDER — IOHEXOL 300 MG/ML  SOLN
100.0000 mL | Freq: Once | INTRAMUSCULAR | Status: AC | PRN
  Administered 2013-03-26: 100 mL via INTRAVENOUS

## 2013-03-26 NOTE — Progress Notes (Signed)
Spoke with Radiologist and the order was incorrect the patient will need an CT abd with and without contrast.  Order in   [place]

## 2013-03-28 ENCOUNTER — Ambulatory Visit: Payer: Medicare Other | Admitting: Gastroenterology

## 2013-04-04 ENCOUNTER — Other Ambulatory Visit: Payer: Self-pay

## 2013-04-04 ENCOUNTER — Ambulatory Visit: Payer: Medicare Other | Admitting: Cardiology

## 2013-04-04 MED ORDER — ZOLPIDEM TARTRATE 10 MG PO TABS: 10.0000 mg | ORAL_TABLET | Freq: Every day | ORAL | Status: DC

## 2013-04-08 NOTE — Telephone Encounter (Signed)
Rx has been faxed.

## 2013-04-09 ENCOUNTER — Telehealth: Payer: Self-pay | Admitting: Neurology

## 2013-04-09 NOTE — Telephone Encounter (Signed)
Patient needs refill of Ambien (generic). Previously had the 90-day supply. She uses [facility] [id number]. Patient is going out of town Friday and will be gone for 2 weeks.

## 2013-04-09 NOTE — Telephone Encounter (Signed)
Rx for 90 day supply was already faxed to [facility].  I called them.  The pharmacy was closed.  I left a message on the dr line asking that they call us back if they do not have the Rx we sent.

## 2013-05-05 ENCOUNTER — Ambulatory Visit (INDEPENDENT_AMBULATORY_CARE_PROVIDER_SITE_OTHER): Payer: Medicare Other | Admitting: Cardiology

## 2013-05-05 ENCOUNTER — Encounter: Payer: Self-pay | Admitting: Cardiology

## 2013-05-05 VITALS — BP 120/80 | HR 64 | Ht 65.0 in | Wt 218.0 lb

## 2013-05-05 DIAGNOSIS — E7800 Pure hypercholesterolemia, unspecified: Secondary | ICD-10-CM

## 2013-05-05 DIAGNOSIS — Z79899 Other long term (current) drug therapy: Secondary | ICD-10-CM | POA: Diagnosis not present

## 2013-05-05 DIAGNOSIS — I2510 Atherosclerotic heart disease of native coronary artery without angina pectoris: Secondary | ICD-10-CM | POA: Diagnosis not present

## 2013-05-05 MED ORDER — NITROGLYCERIN 0.4 MG SL SUBL: 0.4000 mg | SUBLINGUAL_TABLET | SUBLINGUAL | Status: DC | PRN

## 2013-05-05 NOTE — Progress Notes (Signed)
HPI The patient presents for followup of her cardiomyopathy and coronary disease that we are managing medically. She had a non-Q-wave myocardial infarction earlier this year. At that time her ejection fraction by echo was 30-35% though it was higher by cath. Follow up echo demonstrated her EF to be 60-65%.  Since I last saw her she has some fleeting chest discomfort. However, this is not like her previous angina. It happens sporadically but most days. She does household chores and cannot bring this on with that. It is sharp shooting and lasts only seconds at a time. There are no associated symptoms. She's had no new shortness of breath, PND or orthopnea. She's had no presyncope or syncope. She is unfortunately not exercising as much as I would like. She does occasionally take a nitroglycerin but this has been a stable pattern.  Allergies  Allergen Reactions  . Iohexol     rash  . Iodine   . Tape     Plastic tape  . Imdur [Isosorbide]     Severe headaches  . Latex Rash  . Verapamil     REACTION: dizziness    Current Outpatient Prescriptions  Medication Sig Dispense Refill  . brimonidine-timolol (COMBIGAN) 0.2-0.5 % ophthalmic solution Place 1 drop into the left eye 2 (two) times daily.      . Carboxymethylcellulose Sodium (LUBRICANT EYE DROPS OP) Apply 1 drop to eye daily as needed. For dry or itchy eyes.       . carvedilol (COREG) 3.125 MG tablet Take 1 tablet (3.125 mg total) by mouth 2 (two) times daily.  180 tablet  3  . carvedilol (COREG) 6.25 MG tablet Take 1 tablet (6.25 mg total) by mouth 2 (two) times daily.  180 tablet  3  . cholecalciferol (VITAMIN D) 1000 UNITS tablet Take 2,000 Units by mouth daily.      . clobetasol ointment (TEMOVATE) 4.09 % Apply 1 application topically 2 (two) times daily.  30 g  5  . clopidogrel (PLAVIX) 75 MG tablet Take 1 tablet (75 mg total) by mouth daily with breakfast.  90 tablet  3  . esomeprazole (NEXIUM) 40 MG capsule Take 40 mg by mouth  daily before breakfast.      . estradiol (ESTRACE) 1 MG tablet Take 1 mg by mouth daily.        [name] gabapentin (NEURONTIN) 100 MG capsule Take 1 capsule (100 mg total) by mouth every 8 (eight) hours as needed. For pain Office visit due  270 capsule  0  . loperamide (IMODIUM) 2 MG capsule Take 2 mg by mouth 4 (four) times daily as needed. For loose stool      . losartan (COZAAR) 50 MG tablet Take 1 tablet (50 mg total) by mouth daily. 1 by mouth once daily  90 tablet  3  . pravastatin (PRAVACHOL) 80 MG tablet Take 1 tablet (80 mg total) by mouth every evening.  90 tablet  3  . prednisoLONE acetate (PRED FORTE) 1 % ophthalmic suspension Place 1 drop into both eyes 2 (two) times daily.       . psyllium (METAMUCIL) 58.6 % powder Take 1 packet by mouth 3 (three) times daily as needed. For regularity      . zolpidem (AMBIEN) 10 MG tablet Take 1 tablet (10 mg total) by mouth at bedtime.  90 tablet  1  . nitroGLYCERIN (NITROSTAT) 0.4 MG SL tablet Place 1 tablet (0.4 mg total) under the tongue every 5 (five) minutes x  3 doses as needed for chest pain.  30 tablet  6   Current Facility-Administered Medications  Medication Dose Route Frequency Provider Last Rate Last Dose  . botulinum toxin Type A (BOTOX) injection 150 Units  150 Units Intramuscular Once Marcial Pacas, MD        Past Medical History  Diagnosis Date  . CAD (coronary artery disease)     NSTEMI 8/13 => LHC 09/27/11: oLM 50%, dLM 30%, oLAD 60-70%, pLAD 50%, mLAD 70%, pD1 30%, pD2 40%, oRCA 25%, mRCA 40%, EF 50% with ant HK => LAD not amenable to PCI => med Rx  unless recurrent angina = > consider CABG  . Ischemic cardiomyopathy     a.  Echo 09/29/11: EF 30-35% with inferior, posterior, lateral AK, anterior severe HK, mild LVH, mild MR, PASP 40;  => b. follow up echo 10/13:  EF 55-60%, Gr 1 diast dysfn, mild MR  . Corneal disorder   . Postmenopausal status   . Back pain   . Dysmetabolic syndrome X   . Arthritis     osteo...right knee  . Carpal  tunnel syndrome   . GERD (gastroesophageal reflux disease)   . Diverticular disease   . Hx of adenomatous colonic polyps   . Hernia     hiatal  . Anxiety state     NOS  . Hypertension     essen. NOS  . Shoulder fracture   . HLD (hyperlipidemia)   . Migraine headache without aura 07/23/2012     Left sided  Lamonte Sakai thought to be migrainous  , hypersensitivity to touch , photophobia ,  no nausea.   . Headache, paroxysmal hemicrania, chronic 07/23/2012     Referral to BOTOX evaluation with dr Rexene Alberts or Krista Blue .  [name] Heart attack 09/2011    Past Surgical History  Procedure Laterality Date  . Appendectomy    . Cholecystectomy    . Abdominal hysterectomy    . Breast lumpectomy Right 1996  . Colonoscopy w/ polypectomy    . Lumbar spur removed    . Total knee arthroplasty Right   . Bunionectomy Right   . Ankle cystectomy Right   . Rectocele surgery    . 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 Hemorrhoid surgery  12/01/2010, 2014    [place] hemorrhoid ligation/pexy  . Cardiac catheterization  09/28/2011    Dr Acie Fredrickson(  to see Dr Ron Parker)  . 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 Corneal transplant Left     x1    ROS:  As stated in the HPI and negative for all other systems.  PHYSICAL EXAM BP 120/80  Pulse 64  Ht 5\' 5"  (1.651 m)  Wt 218 lb (98.884 kg)  BMI 36.28 kg/m2 GENERAL:  Well appearing HEENT:  Pupils equal round and reactive, fundi not visualized, oral mucosa unremarkable NECK:  No jugular venous distention, waveform within normal limits, carotid upstroke brisk and symmetric, no bruits, no thyromegaly LUNGS:  Clear to auscultation bilaterally CHEST:  Unremarkable HEART:  PMI not displaced or sustained,S1 and S2 within normal limits, no S3, no S4, no clicks, no rubs, no murmurs ABD:  Flat, positive bowel sounds normal in frequency in pitch, no bruits, no rebound, no guarding, no midline pulsatile mass, no hepatomegaly, no splenomegaly EXT:  2 plus pulses throughout, no edema, no  cyanosis no clubbing  EKG:  Sinus rhythm, rate 64, axis within normal limits, intervals within normal limits, no acute ST-T wave changes.05/05/2013  ASSESSMENT AND PLAN  Coronary Artery Disease:  The patient has no new sypmtoms.  No further cardiovascular testing is indicated.  We will continue with aggressive risk reduction and meds as listed.  Ischemic Cardiomyopathy:  She has residual mildly reduced ejection fraction. No change in therapy is indicated.  Hyperlipidemia:  Her LDL recently was 140. However, it turns out she's not been taking her pravastatin. We discussed this. What she is taking this routinely she should get her lipids checked in about 10 weeks.  Hypertension: Her blood pressure is controlled. I will however be changing meds as listed.

## 2013-05-05 NOTE — Patient Instructions (Addendum)
The current medical regimen is effective;  continue present plan and medications. Please restart your pravastatin.  Have fasting blood work in 10 weeks. (Lipid and liver around the week of July 14, 2013)  Please call for an appointment when you are ready.  Follow up in 1 year with Dr Percival Spanish.  You will receive a letter in the mail 2 months before you are due.  Please call us when you receive this letter to schedule your follow up appointment.

## 2013-05-15 ENCOUNTER — Other Ambulatory Visit: Payer: Self-pay

## 2013-05-15 DIAGNOSIS — R52 Pain, unspecified: Secondary | ICD-10-CM

## 2013-05-15 MED ORDER — GABAPENTIN 100 MG PO CAPS: 100.0000 mg | ORAL_CAPSULE | Freq: Three times a day (TID) | ORAL | Status: DC | PRN

## 2013-06-04 DIAGNOSIS — M76899 Other specified enthesopathies of unspecified lower limb, excluding foot: Secondary | ICD-10-CM | POA: Diagnosis not present

## 2013-06-04 DIAGNOSIS — M1710 Unilateral primary osteoarthritis, unspecified knee: Secondary | ICD-10-CM | POA: Diagnosis not present

## 2013-06-04 DIAGNOSIS — IMO0002 Reserved for concepts with insufficient information to code with codable children: Secondary | ICD-10-CM | POA: Diagnosis not present

## 2013-06-11 ENCOUNTER — Ambulatory Visit (INDEPENDENT_AMBULATORY_CARE_PROVIDER_SITE_OTHER): Payer: Medicare Other | Admitting: Neurology

## 2013-06-11 ENCOUNTER — Encounter: Payer: Self-pay | Admitting: Neurology

## 2013-06-11 VITALS — BP 122/69 | HR 52 | Ht 65.0 in | Wt 216.0 lb

## 2013-06-11 DIAGNOSIS — G43719 Chronic migraine without aura, intractable, without status migrainosus: Secondary | ICD-10-CM

## 2013-06-11 DIAGNOSIS — G43009 Migraine without aura, not intractable, without status migrainosus: Secondary | ICD-10-CM

## 2013-06-11 MED ORDER — ONABOTULINUMTOXINA 100 UNITS IJ SOLR
150.0000 [IU] | Freq: Once | INTRAMUSCULAR | Status: AC
  Administered 2013-06-11: 150 [IU] via INTRAMUSCULAR

## 2013-06-11 NOTE — Progress Notes (Signed)
History of present illness:    Connie Owens is a 68 y.o. female here as a referral from Dr. Brett Fairy for evaluation of chronic headaches.  She has a past medical history of of sharpish sleep apnea, using CPAP machine, also has past medical history of coronary artery disease, corneal disorder, status post bilateral corneal transplant, anxiety, hypertension, hyperlipidemia, migraine headaches  She presented with chronic headaches  since January 2013, she described almost constant daily left temporal frontal area pressure headaches, throbbing pain, intermixed with sharp piercing pain,  lasting for a few seconds, 8 out of 10, she denies visual loss, she denies light and noise sensitivity,  She has tried Cardizem calcium channel blocker, no change in her headache, she is also taking gabapentin 100 mg 3 times a day, with no significant improvement,     She is taking Tylenol, occasionally oxycodone, aspirin, without significant help either.atient having severe palpitations.  CRP and sedimentation rate were in normal range. The patient also had a Doppler study for a temporal arteritis., which was negative.  She began to receive Botox injection as her chronic migraine prevention since July 2014,  UPDATE April 29th 2015:  She responded better from last injection in October 2014,.  She reported 45% improvement of her headaches. There was no significant side effect notice, she has 3-4 milder headache each week, taking Tylenol as needed, she is already taking Tylenol for her arthritis knee pain    Review of Systems:  Out of a complete 14 system review, the patient complains of only the following symptoms, and all other reviewed systems are negative.    Social History  . Marital Status: Married    Spouse Name: N/A    Number of Children: N/A  . Years of Education: N/A   Occupational History  . retired    Social History Main Topics  . Smoking status: Never Smoker   . Smokeless tobacco: Never  Used  . Alcohol Use: Yes     Comment: rare  . Drug Use: No  . Sexually Active: Not on file   Other Topics Concern  . Not on file   Social History Narrative   Daily caffeine   Regular exercise    Family History  Problem Relation Age of Onset  . Breast cancer Mother   . Heart disease Father   . Stroke Father   . Dementia Father   . Diabetes Sister     3 sisters   . Heart disease Sister     1 sister CABG  . Breast cancer Sister     PTE pre & post Dx of ca  . Diabetes Paternal Grandfather   . Colon cancer Neg Hx   . Prostate cancer Father     Past Medical History  Diagnosis Date  . CAD (coronary artery disease)   . Corneal disorder   . Postmenopausal status   . Back pain   . Dysmetabolic syndrome X   . Arthritis     osteo...right knee  . Carpal tunnel syndrome   . GERD (gastroesophageal reflux disease)   . Diverticular disease   . Hx of adenomatous colonic polyps   . Hernia   . Anxiety state   . Hypertension   . Shoulder fracture   . HLD (hyperlipidemia)   . Migraine headache without aura 07/23/2012  . Headache, paroxysmal hemicrania, chronic 07/23/2012    Past Surgical History  Procedure Laterality Date  . Appendectomy    . Cholecystectomy    .  Abdominal hysterectomy    . Breast lumpectomy Right 1996  . Colonoscopy w/ polypectomy    . Lumbar spur removed    . Total knee arthroplasty Right   . Bunionectomy Right   . Ankle cystectomy Right   . Rectocele surgery    . 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 Hemorrhoid surgery  12/01/2010, 2014    [place] hemorrhoid ligation/pexy  . Cardiac catheterization  09/28/2011    Dr Acie Fredrickson(  to see Dr Ron Parker)  . 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 Corneal transplant Left     x1    Current Outpatient Prescriptions  Medication Sig Dispense Refill  . brimonidine-timolol (COMBIGAN) 0.2-0.5 % ophthalmic solution Place 1 drop into the left eye 2 (two) times daily.      . Carboxymethylcellulose Sodium (LUBRICANT EYE  DROPS OP) Apply 1 drop to eye daily as needed. For dry or itchy eyes.       . carvedilol (COREG) 3.125 MG tablet Take 1 tablet (3.125 mg total) by mouth 2 (two) times daily.  180 tablet  3  . carvedilol (COREG) 6.25 MG tablet Take 1 tablet (6.25 mg total) by mouth 2 (two) times daily.  180 tablet  3  . cholecalciferol (VITAMIN D) 1000 UNITS tablet Take 2,000 Units by mouth daily.      . clobetasol ointment (TEMOVATE) 9.51 % Apply 1 application topically 2 (two) times daily.  30 g  5  . clopidogrel (PLAVIX) 75 MG tablet Take 1 tablet (75 mg total) by mouth daily with breakfast.  90 tablet  3  . esomeprazole (NEXIUM) 40 MG capsule Take 40 mg by mouth daily before breakfast.      . estradiol (ESTRACE) 1 MG tablet Take 1 mg by mouth daily.        [name] gabapentin (NEURONTIN) 100 MG capsule Take 1 capsule (100 mg total) by mouth every 8 (eight) hours as needed. For pain Office visit due  270 capsule  0  . loperamide (IMODIUM) 2 MG capsule Take 2 mg by mouth 4 (four) times daily as needed. For loose stool      . losartan (COZAAR) 50 MG tablet Take 1 tablet (50 mg total) by mouth daily. 1 by mouth once daily  90 tablet  3  . nitrofurantoin, macrocrystal-monohydrate, (MACROBID) 100 MG capsule 100 mg daily.      . nitroGLYCERIN (NITROSTAT) 0.4 MG SL tablet Place 1 tablet (0.4 mg total) under the tongue every 5 (five) minutes x 3 doses as needed for chest pain.  25 tablet  prn  . oxyCODONE-acetaminophen (PERCOCET/ROXICET) 5-325 MG per tablet 325 tablets daily.      . pravastatin (PRAVACHOL) 80 MG tablet Take 1 tablet (80 mg total) by mouth every evening.  90 tablet  3  . prednisoLONE acetate (PRED FORTE) 1 % ophthalmic suspension Place 1 drop into both eyes 2 (two) times daily.       . psyllium (METAMUCIL) 58.6 % powder Take 1 packet by mouth 3 (three) times daily as needed. For regularity      . zolpidem (AMBIEN) 10 MG tablet Take 1 tablet (10 mg total) by mouth at bedtime.  90 tablet  1   Current  Facility-Administered Medications  Medication Dose Route Frequency Provider Last Rate Last Dose  . botulinum toxin Type A (BOTOX) injection 150 Units  150 Units Intramuscular Once Marcial Pacas, MD        Allergies as of 06/11/2013 - Review Complete 06/11/2013  Allergen Reaction Noted  . Iohexol  08/12/2003  . Iodine  09/27/2011  . Tape  09/27/2011  . Imdur [isosorbide]  10/06/2011  . Latex Rash 09/27/2011  . Verapamil  11/04/2008    Vitals: BP 122/69  Pulse 52  Ht 5\' 5"  (1.651 m)  Wt 216 lb (97.977 kg)  BMI 35.94 kg/m2 Last Weight:  Wt Readings from Last 1 Encounters:  06/11/13 216 lb (97.977 kg)   Last Height:   Ht Readings from Last 1 Encounters:  06/11/13 5\' 5"  (1.651 m)   Vision Screening:   Cornea transplant, total of 3 surgeries.  Physical exam:  General: The patient is awake, alert and appears not in acute distress. The patient is well groomed. Head: Normocephalic, atraumatic.  Neck is supple.  Cardiovascular:  Regular rate and rhythm, without  murmurs or carotid bruit, and without distended neck veins. Respiratory: Lungs are clear to auscultation. Skin:  Without evidence of edema, or rash Trunk: BMI is elevated and patient  has normal posture.  Neurologic exam : The patient is awake and alert, oriented to place and time.   Speech is fluent without  dysarthria, dysphonia or aphasia.   Cranial nerves: Pupils are equal and briskly reactive to light. S/p bilateral corneal implantation.  Hearing to finger rub intact.  Facial sensation intact to fine touch. Facial motor strength is symmetric and tongue and uvula move midline.  Motor exam:   Normal tone and normal muscle bulk and symmetric normal strength in all extremities.   Sensory:  Intact to light touch pin prick.  Coordination: Rapid alternating movements in the fingers/hands is tested and normal. Finger-to-nose maneuver tested and normal without evidence of ataxia, dysmetria or tremor.  Gait and station:  Patient walks without assistive device and is able and assisted stool climb up to the exam table. Strength within normal limits. Stance is stable and normal. Tandem gait is  unfragmented.   Deep tendon reflexes: normal and symmetric  Assessment and Plan:  68 years old right-handed Caucasian female, with chronic headaches since January 2013, failed preventive medication Neurontin, calcium blocker,  BOTOX injection was performed according to protocol by [employer].  100 units of BOTOX/ 2 cc NS.  Total of 150 units,   Corrugator 2 sites, 10 units Procerus 1 site, 5 unit Frontalis 4 sites,  20 units, Temporalis 8 sites,  40 units  Occipitalis 6 sites, 30 units Cervical Paraspinal, 4 sites, 20 units Trapezius, 6 sites, 30 units  Patient tolerate the injection well. Will return for repeat injection in 3 months.

## 2013-06-24 DIAGNOSIS — H547 Unspecified visual loss: Secondary | ICD-10-CM | POA: Diagnosis not present

## 2013-06-24 DIAGNOSIS — Z947 Corneal transplant status: Secondary | ICD-10-CM | POA: Diagnosis not present

## 2013-06-24 DIAGNOSIS — H04129 Dry eye syndrome of unspecified lacrimal gland: Secondary | ICD-10-CM | POA: Diagnosis not present

## 2013-06-24 DIAGNOSIS — Z79899 Other long term (current) drug therapy: Secondary | ICD-10-CM | POA: Diagnosis not present

## 2013-07-21 DIAGNOSIS — H4011X0 Primary open-angle glaucoma, stage unspecified: Secondary | ICD-10-CM | POA: Diagnosis not present

## 2013-07-30 ENCOUNTER — Encounter: Payer: Self-pay | Admitting: Neurology

## 2013-07-30 ENCOUNTER — Ambulatory Visit (INDEPENDENT_AMBULATORY_CARE_PROVIDER_SITE_OTHER): Payer: Medicare Other | Admitting: Neurology

## 2013-07-30 VITALS — BP 146/89 | HR 62 | Resp 17 | Ht 65.0 in | Wt 216.0 lb

## 2013-07-30 DIAGNOSIS — Z9989 Dependence on other enabling machines and devices: Secondary | ICD-10-CM

## 2013-07-30 DIAGNOSIS — H409 Unspecified glaucoma: Secondary | ICD-10-CM

## 2013-07-30 DIAGNOSIS — G4733 Obstructive sleep apnea (adult) (pediatric): Secondary | ICD-10-CM | POA: Diagnosis not present

## 2013-07-30 DIAGNOSIS — I2510 Atherosclerotic heart disease of native coronary artery without angina pectoris: Secondary | ICD-10-CM

## 2013-07-30 DIAGNOSIS — E669 Obesity, unspecified: Secondary | ICD-10-CM

## 2013-07-30 HISTORY — DX: Unspecified glaucoma: H40.9

## 2013-07-30 NOTE — Progress Notes (Signed)
[facility]  Provider:  Dr Dohmeier Referring Provider: Hendricks Limes, MD Primary Care Physician:  Garnet Koyanagi, DO  Chief Complaint  Patient presents with  . Follow-up    Room 10  . Sleep Apnea    HPI:  Connie Owens is a 68 y.o. female here as a referral from Dr. Linna Darner for follow up on her mild sleep apnea,   Patient was diagnosed with apnea followed by a CPAP titration on 05-21-12 and she responded very well to only 6 cm water pressure applied by a nasal mask. She is seen today for a compliance followup she has a mild air leak the residual AHI of only 0.4 at 6 cm water pressure and an average use of the CPAP machine at 4 hours and 39 minutes compliance in use days was 86/90 days-  the patient was not able to take her machine on an unexpected family emergency and had to leave.  She likes the machine , feels better when using it, her Epworth score is now at 4 points, and FSS was 53 points, she stll has a dry mouth.  She just had her DME adjust the humidity levels. She uses biotin mouth wash.  I offer a chin strap through DME. [place].     Past history :   Titrated to 5 6 cm water, 100% compliant over the last 30 days.  She was asked by [facility] care to provide data for her CPAP. She has not noticed an positive effect on her headaches at this time and she has been using the machine now for 8 weeks. She continues with his atypical facial pain that has some migrainous component, she has a history of chronic insomnia for which the prescribed medication on the second of January 2013. Since , she has been using Ambien and was happy so far with good effect .  In August 2013 the patient suffered a myocardial infarction / myocardial event and she said that at the time she had also multiple other aches and pains that were dividing her attention. Dr. Linna Darner had seen the patient just in May  2014 of this year he had ordered a  cardiac telemetry for the patient . The portable  monitor her was ordered in response to the patient having severe palpitations. She was diagnosed with apnea, brady tachy arrhythmia.   Patient has seen Dr. Krista Blue for Botox injection, which  thus far have not influenced her headache pattern. She reports scalp tenderness.   Due to her coronary artery disease I would not like this patient to use NSAID . Dr. Gladstone Lighter, her orthopedist just last week placed her on Mobic for right knee swelling and painful right knee.  I would like still for the patient to remain off any additional nonsteroidals and she agrees with the referral for a Botox evaluation and possible injection. She is to confirm with her cardiologist that this is OK. In the past I have  attempted to treat the patient with  Neurontin( gabapentin) but  it had nor did no effect on her head pain.  And I have tried a calcium channel blocker, namely Cardizem 30 mg tablets at night ,which had also no positive effect.  Again the home sleep test and the apnea evaluation was ordered after the patient developed her headaches, facial pain, tinnitus.  Please note that jaw claudication was not observed and CRP and sedimentation rate were in normal range.  The patient also had a Doppler study for a  temporal arteritis., which was negative.    She takes daily  1 hour daily  naps and feels refreshed, she normally feels refreshed when waking up in the morning. Her bedtime is from midnight on up to 8 AM and there is very little variation between weekend and weekdays she sometimes has poor asleep because of joint pain and myalgia.  Headaches became a problem in January 2014- she had one fall did not injure her head but does developed a temporal trigger point on the left side of her head  area of numbness, achiness around it. Next is another arisen from the right side.   Review of Systems: Out of a complete 14 system review, the patient complains of only the following symptoms, and all other reviewed systems are  negative. Fatigue but not longer sleepiness, dry mouth, headaches.    History   Social History  . Marital Status: Married    Spouse Name: Ruthann Cancer    Number of Children: 3  . Years of Education: 14   Occupational History  . retired   .     Social History Main Topics  . Smoking status: Never Smoker   . Smokeless tobacco: Never Used  . Alcohol Use: Yes     Comment: rare  . Drug Use: No  . Sexual Activity: Not on file   Other Topics Concern  . Not on file   Social History Narrative   Patient is married Ruthann Cancer) and lives at home with her husband.   Patient has three adult children.   Patient has a college education.   Patient is right-handed.   Patient drinks two cups of coffee daily.   Regular exercise   Patient is retired.    Family History  Problem Relation Age of Onset  . Breast cancer Mother   . Heart disease Father   . Stroke Father   . Dementia Father   . Prostate cancer Father   . Diabetes Sister     3 sisters   . Heart disease Sister     1 sister CABG  . Breast cancer Sister     PTE pre & post Dx of ca  . Diabetes Paternal Grandfather   . Colon cancer Neg Hx   . Breast cancer Sister     Past Medical History  Diagnosis Date  . CAD (coronary artery disease)     NSTEMI 8/13 => LHC 09/27/11: oLM 50%, dLM 30%, oLAD 60-70%, pLAD 50%, mLAD 70%, pD1 30%, pD2 40%, oRCA 25%, mRCA 40%, EF 50% with ant HK => LAD not amenable to PCI => med Rx  unless recurrent angina = > consider CABG  . Ischemic cardiomyopathy     a.  Echo 09/29/11: EF 30-35% with inferior, posterior, lateral AK, anterior severe HK, mild LVH, mild MR, PASP 40;  => b. follow up echo 10/13:  EF 55-60%, Gr 1 diast dysfn, mild MR  . Corneal disorder   . Postmenopausal status   . Back pain   . Dysmetabolic syndrome X   . Arthritis     osteo...right knee  . Carpal tunnel syndrome   . GERD (gastroesophageal reflux disease)   . Diverticular disease   . Hx of adenomatous colonic polyps   .  Hernia     hiatal  . Anxiety state     NOS  . Hypertension     essen. NOS  . Shoulder fracture   . HLD (hyperlipidemia)   . Migraine headache without aura 07/23/2012  Left sided  Lamonte Sakai thought to be migrainous  , hypersensitivity to touch , photophobia ,  no nausea.   . Headache, paroxysmal hemicrania, chronic 07/23/2012     Referral to BOTOX evaluation with dr Rexene Alberts or Krista Blue .  [name] Heart attack 09/2011  . 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     Past Surgical History  Procedure Laterality Date  . Appendectomy    . Cholecystectomy    . Abdominal hysterectomy    . Breast lumpectomy Right 1996  . Colonoscopy w/ polypectomy    . Lumbar spur removed    . Total knee arthroplasty Right   . Bunionectomy Right   . Ankle cystectomy Right   . Rectocele surgery    . 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 Hemorrhoid surgery  12/01/2010, 2014    [place] hemorrhoid ligation/pexy  . Cardiac catheterization  09/28/2011    Dr Acie Fredrickson(  to see Dr Ron Parker)  . 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 Corneal transplant Left     x1    Current Outpatient Prescriptions  Medication Sig Dispense Refill  . aspirin 81 MG tablet Take 81 mg by mouth daily.      . brimonidine-timolol (COMBIGAN) 0.2-0.5 % ophthalmic solution Place 1 drop into the left eye 2 (two) times daily.      . brimonidine-timolol (COMBIGAN) 0.2-0.5 % ophthalmic solution Apply to eye.      . Carboxymethylcellulose Sodium (LUBRICANT EYE DROPS OP) Apply 1 drop to eye daily as needed. For dry or itchy eyes.       . carvedilol (COREG) 3.125 MG tablet Take 1 tablet (3.125 mg total) by mouth 2 (two) times daily.  180 tablet  3  . carvedilol (COREG) 6.25 MG tablet Take 1 tablet (6.25 mg total) by mouth 2 (two) times daily.  180 tablet  3  . cholecalciferol (VITAMIN D) 1000 UNITS tablet Take 2,000 Units by mouth daily.      . clobetasol ointment (TEMOVATE) 9.32 % Apply 1  application topically 2 (two) times daily.  30 g  5  . clopidogrel (PLAVIX) 75 MG tablet Take 1 tablet (75 mg total) by mouth daily with breakfast.  90 tablet  3  . esomeprazole (NEXIUM) 40 MG capsule Take 40 mg by mouth daily before breakfast.      . estradiol (ESTRACE) 1 MG tablet Take 1 mg by mouth daily.        [name] gabapentin (NEURONTIN) 100 MG capsule Take 1 capsule (100 mg total) by mouth every 8 (eight) hours as needed. For pain Office visit due  270 capsule  0  . loperamide (IMODIUM) 2 MG capsule Take 2 mg by mouth 4 (four) times daily as needed. For loose stool      . losartan (COZAAR) 50 MG tablet Take 1 tablet (50 mg total) by mouth daily. 1 by mouth once daily  90 tablet  3  . nitroGLYCERIN (NITROSTAT) 0.4 MG SL tablet Place 1 tablet (0.4 mg total) under the tongue every 5 (five) minutes x 3 doses as needed for chest pain.  25 tablet  prn  . pravastatin (PRAVACHOL) 80 MG tablet Take 1 tablet (80 mg total) by mouth every evening.  90 tablet  3  . prednisoLONE acetate (PRED FORTE) 1 % ophthalmic suspension Place 1 drop into both eyes 2 (two) times daily.       . prednisoLONE acetate (  PRED FORTE) 1 % ophthalmic suspension Apply to eye.      . psyllium (METAMUCIL) 58.6 % powder Take 1 packet by mouth 3 (three) times daily as needed. For regularity      . zolpidem (AMBIEN) 10 MG tablet Take 1 tablet (10 mg total) by mouth at bedtime.  90 tablet  1   Current Facility-Administered Medications  Medication Dose Route Frequency Provider Last Rate Last Dose  . botulinum toxin Type A (BOTOX) injection 150 Units  150 Units Intramuscular Once Marcial Pacas, MD        Allergies as of 07/30/2013 - Review Complete 07/30/2013  Allergen Reaction Noted  . Iohexol  08/12/2003  . Iodine  09/27/2011  . Tape  09/27/2011  . Imdur [isosorbide]  10/06/2011  . Latex Rash 09/27/2011  . Verapamil  11/04/2008    Vitals: BP 146/89  Pulse 62  Resp 17  Ht 5\' 5"  (1.651 m)  Wt 216 lb (97.977 kg)  BMI 35.94  kg/m2 Last Weight:  Wt Readings from Last 1 Encounters:  07/30/13 216 lb (97.977 kg)   Last Height:   Ht Readings from Last 1 Encounters:  07/30/13 5\' 5"  (1.651 m)   Vision Screening:   Cornea transplant, total of 3 surgeries.  Physical exam:  General: The patient is awake, alert and appears not in acute distress. The patient is well groomed. Head: Normocephalic, atraumatic. Neck is supple. Mallampati 4 , neck circumference: 17inches,   No retrognathia, no nasal obstruction.   Cardiovascular:  Regular rate and rhythm, without  murmurs or carotid bruit, and without distended neck veins. Respiratory: Lungs are clear to auscultation. Skin:  Without evidence of edema, or rash Trunk: BMI is elevated and patient  has normal posture.  Neurologic exam : The patient is awake and alert, oriented to place and time.   Memory subjective  described as intact. There is a normal attention span & concentration ability. Speech is fluent without  dysarthria, dysphonia or aphasia. Mood and affect are appropriate.  Cranial nerves: Pupils are equal and briskly reactive to light. . Hearing to finger rub intact.  Facial sensation intact to fine touch. Facial motor strength is symmetric and tongue and uvula move midline.  Motor exam:   Normal tone and normal muscle bulk and symmetric normal strength in all extremities. Right knee pain  Limits ROM.   Sensory:  Fine touch, pinprick and vibration were tested in all extremities. Proprioception is tested in the upper extremities only.   Coordination: Rapid alternating movements in the fingers/hands is tested and normal. Finger-to-nose maneuver tested and normal without evidence of ataxia, dysmetria or tremor.  Gait and station: Patient walks without assistive device .  Deep tendon reflexes: in the  upper and lower extremities are symmetric and intact.  Assessment:  After physical and neurologic examination, review of laboratory studies, imaging,  neurophysiology testing and pre-existing records, assessment -  OSA on low pressure CPAP with nasal pillows,  Obesity and snoring. Ongoing mouth breather. Good compliance and low residual AHI.    Plan:  Treatment plan and additional workup   Continue CPAP, yearly RV with machine or download, I will order a chins strap for the patient through [facility]>

## 2013-07-30 NOTE — Patient Instructions (Signed)

## 2013-08-01 ENCOUNTER — Encounter: Payer: Self-pay | Admitting: Neurology

## 2013-08-01 ENCOUNTER — Ambulatory Visit (INDEPENDENT_AMBULATORY_CARE_PROVIDER_SITE_OTHER): Payer: Medicare Other | Admitting: Neurology

## 2013-08-01 VITALS — BP 162/107 | HR 63 | Ht 65.0 in | Wt 217.5 lb

## 2013-08-01 DIAGNOSIS — E785 Hyperlipidemia, unspecified: Secondary | ICD-10-CM | POA: Diagnosis not present

## 2013-08-01 DIAGNOSIS — I2510 Atherosclerotic heart disease of native coronary artery without angina pectoris: Secondary | ICD-10-CM

## 2013-08-01 DIAGNOSIS — G43009 Migraine without aura, not intractable, without status migrainosus: Secondary | ICD-10-CM

## 2013-08-01 MED ORDER — NORTRIPTYLINE HCL 10 MG PO CAPS: 10.0000 mg | ORAL_CAPSULE | Freq: Every day | ORAL | Status: DC

## 2013-08-01 NOTE — Progress Notes (Signed)
History of present illness:    Connie Owens is a 68 y.o. female here as a referral from Dr. Brett Fairy for evaluation of chronic headaches.  She has a past medical history of of obstructive sleep apnea, using CPAP machine, also has past medical history of coronary artery disease, corneal disorder, Fuch's dystrophy, status post bilateral corneal transplant, anxiety, hypertension, hyperlipidemia, migraine headaches  She presented with chronic headaches since January 2013, she described almost constant daily left temporal frontal area pressure headaches, throbbing pain, intermixed with sharp piercing pain,  lasting for a few seconds, 8 out of 10, she denies visual loss, she denies light and noise sensitivity,  She has tried Cardizem calcium channel blocker, no change in her headache, she is also taking gabapentin 100 mg 3 times a day, with no significant improvement,     She is taking Tylenol, occasionally oxycodone, aspirin, without significant help either.atient having severe palpitations.  CRP and sedimentation rate were in normal range. The patient also had a Doppler study for a temporal arteritis., which was negative.  She began to receive Botox injection as her chronic migraine prevention since July 2014,  UPDATE June 19th 2015:  Last Botox injection was in April 2015,. She was not sure if that has helped her headaches, she continued to have frequent pressure sensation at her left side, always on left side, she denies visual loss, no lateralized motor or sensory deficit  Review of Systems:  Out of a complete 14 system review, the patient complains of only the following symptoms, and all other reviewed systems are negative.    Social History  . Marital Status: Married    Spouse Name: N/A    Number of Children: N/A  . Years of Education: N/A   Occupational History  . retired    Social History Main Topics  . Smoking status: Never Smoker   . Smokeless tobacco: Never Used  .  Alcohol Use: Yes     Comment: rare  . Drug Use: No  . Sexually Active: Not on file   Other Topics Concern  . Not on file   Social History Narrative   Daily caffeine   Regular exercise    Family History  Problem Relation Age of Onset  . Breast cancer Mother   . Heart disease Father   . Stroke Father   . Dementia Father   . Diabetes Sister     3 sisters   . Heart disease Sister     1 sister CABG  . Breast cancer Sister     PTE pre & post Dx of ca  . Diabetes Paternal Grandfather   . Colon cancer Neg Hx   . Prostate cancer Father     Past Medical History  Diagnosis Date  . CAD (coronary artery disease)   . Corneal disorder   . Postmenopausal status   . Back pain   . Dysmetabolic syndrome X   . Arthritis     osteo...right knee  . Carpal tunnel syndrome   . GERD (gastroesophageal reflux disease)   . Diverticular disease   . Hx of adenomatous colonic polyps   . Hernia   . Anxiety state   . Hypertension   . Shoulder fracture   . HLD (hyperlipidemia)   . Migraine headache without aura 07/23/2012  . Headache, paroxysmal hemicrania, chronic 07/23/2012    Past Surgical History  Procedure Laterality Date  . Appendectomy    . Cholecystectomy    . Abdominal hysterectomy    .  Breast lumpectomy Right 1996  . Colonoscopy w/ polypectomy    . Lumbar spur removed    . Total knee arthroplasty Right   . Bunionectomy Right   . Ankle cystectomy Right   . Rectocele surgery    . 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 Hemorrhoid surgery  12/01/2010, 2014    [place] hemorrhoid ligation/pexy  . Cardiac catheterization  09/28/2011    Dr Acie Fredrickson(  to see Dr Ron Parker)  . 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 Corneal transplant Left     x1    Current Outpatient Prescriptions  Medication Sig Dispense Refill  . aspirin 81 MG tablet Take 81 mg by mouth daily.      . brimonidine-timolol (COMBIGAN) 0.2-0.5 % ophthalmic solution Apply to eye.      . Carboxymethylcellulose Sodium  (LUBRICANT EYE DROPS OP) Apply 1 drop to eye daily as needed. For dry or itchy eyes.       . carvedilol (COREG) 3.125 MG tablet Take 1 tablet (3.125 mg total) by mouth 2 (two) times daily.  180 tablet  3  . carvedilol (COREG) 6.25 MG tablet Take 1 tablet (6.25 mg total) by mouth 2 (two) times daily.  180 tablet  3  . cholecalciferol (VITAMIN D) 1000 UNITS tablet Take 2,000 Units by mouth daily.      . clobetasol ointment (TEMOVATE) 5.36 % Apply 1 application topically 2 (two) times daily.  30 g  5  . clopidogrel (PLAVIX) 75 MG tablet Take 1 tablet (75 mg total) by mouth daily with breakfast.  90 tablet  3  . esomeprazole (NEXIUM) 40 MG capsule Take 40 mg by mouth daily before breakfast.      . estradiol (ESTRACE) 1 MG tablet Take 1 mg by mouth daily.        [name] gabapentin (NEURONTIN) 100 MG capsule Take 1 capsule (100 mg total) by mouth every 8 (eight) hours as needed. For pain Office visit due  270 capsule  0  . loperamide (IMODIUM) 2 MG capsule Take 2 mg by mouth 4 (four) times daily as needed. For loose stool      . losartan (COZAAR) 50 MG tablet Take 1 tablet (50 mg total) by mouth daily. 1 by mouth once daily  90 tablet  3  . nitroGLYCERIN (NITROSTAT) 0.4 MG SL tablet Place 1 tablet (0.4 mg total) under the tongue every 5 (five) minutes x 3 doses as needed for chest pain.  25 tablet  prn  . pravastatin (PRAVACHOL) 80 MG tablet Take 1 tablet (80 mg total) by mouth every evening.  90 tablet  3  . prednisoLONE acetate (PRED FORTE) 1 % ophthalmic suspension Place 1 drop into both eyes 2 (two) times daily.       . psyllium (METAMUCIL) 58.6 % powder Take 1 packet by mouth 3 (three) times daily as needed. For regularity      . zolpidem (AMBIEN) 10 MG tablet Take 1 tablet (10 mg total) by mouth at bedtime.  90 tablet  1   Current Facility-Administered Medications  Medication Dose Route Frequency Provider Last Rate Last Dose  . botulinum toxin Type A (BOTOX) injection 150 Units  150 Units Intramuscular  Once Marcial Pacas, MD        Allergies as of 08/01/2013 - Review Complete 08/01/2013  Allergen Reaction Noted  . Iohexol  08/12/2003  . Iodine  09/27/2011  . Tape  09/27/2011  . Imdur [isosorbide]  10/06/2011  . Latex  Rash 09/27/2011  . Verapamil  11/04/2008    Vitals: BP 162/107  Pulse 63  Ht 5\' 5"  (1.651 m)  Wt 217 lb 8 oz (98.657 kg)  BMI 36.19 kg/m2 Last Weight:  Wt Readings from Last 1 Encounters:  08/01/13 217 lb 8 oz (98.657 kg)   Last Height:   Ht Readings from Last 1 Encounters:  08/01/13 5\' 5"  (1.651 m)   Vision Screening:   Cornea transplant, total of 3 surgeries.  Physical exam:  General: The patient is awake, alert and appears not in acute distress. The patient is well groomed. Head: Normocephalic, atraumatic.  Neck is supple.  Cardiovascular:  Regular rate and rhythm, without  murmurs or carotid bruit, and without distended neck veins. Respiratory: Lungs are clear to auscultation. Skin:  Without evidence of edema, or rash Trunk: BMI is elevated and patient  has normal posture.  Neurologic exam : The patient is awake and alert, oriented to place and time.   Speech is fluent without  dysarthria, dysphonia or aphasia.   Cranial nerves: Pupils are equal and briskly reactive to light. S/p bilateral corneal implantation.  Hearing to finger rub intact.  Facial sensation intact to fine touch. Facial motor strength is symmetric and tongue and uvula move midline.  Motor exam:   Normal tone and normal muscle bulk and symmetric normal strength in all extremities.   Sensory:  Intact to light touch pin prick.  Coordination: Rapid alternating movements in the fingers/hands is tested and normal. Finger-to-nose maneuver tested and normal without evidence of ataxia, dysmetria or tremor.  Gait and station: Patient walks without assistive device and is able and assisted stool climb up to the exam table. Strength within normal limits. Stance is stable and normal. Tandem gait  is  unfragmented.   Deep tendon reflexes: normal and symmetric  Assessment and Plan:  68 years old right-handed Caucasian female, with chronic headaches since January 2013, failed preventive medication Neurontin, calcium blocker,  MRI of the brain to rule out structural lesion,  Add low dose nortriptyline 10 mg every night, return to clinic with Hoyle Sauer in 3 months,  May also consider other preventive medications, such as Topamax, gabapentin,

## 2013-08-03 ENCOUNTER — Other Ambulatory Visit: Payer: Self-pay | Admitting: Internal Medicine

## 2013-08-05 ENCOUNTER — Other Ambulatory Visit: Payer: Self-pay

## 2013-08-05 DIAGNOSIS — I214 Non-ST elevation (NSTEMI) myocardial infarction: Secondary | ICD-10-CM

## 2013-08-05 MED ORDER — CLOPIDOGREL BISULFATE 75 MG PO TABS: 75.0000 mg | ORAL_TABLET | Freq: Every day | ORAL | Status: DC

## 2013-08-05 MED ORDER — LOSARTAN POTASSIUM 50 MG PO TABS: 50.0000 mg | ORAL_TABLET | Freq: Every day | ORAL | Status: DC

## 2013-08-06 ENCOUNTER — Telehealth: Payer: Self-pay | Admitting: *Deleted

## 2013-08-06 DIAGNOSIS — R52 Pain, unspecified: Secondary | ICD-10-CM

## 2013-08-06 MED ORDER — GABAPENTIN 100 MG PO CAPS: 100.0000 mg | ORAL_CAPSULE | Freq: Three times a day (TID) | ORAL | Status: DC | PRN

## 2013-08-06 NOTE — Telephone Encounter (Signed)
Patient called for a refill. Within date per protocol. Gabapentin has been sent.   KP

## 2013-08-06 NOTE — Telephone Encounter (Signed)
MSG left to call the office for clarification of message.     KP

## 2013-08-06 NOTE — Telephone Encounter (Signed)
Caller name: Jericha Relation to pt:  self Call back number: [phone]  Pharmacy:  [facility] mail order  Reason for call:   Pt called, states [facility] called and said her prescription for gabapentin (NEURONTIN) 100 MG capsule has been denied.  Please advise.  bw

## 2013-08-09 ENCOUNTER — Ambulatory Visit
Admission: RE | Admit: 2013-08-09 | Discharge: 2013-08-09 | Disposition: A | Discharge status: Home / Self Care | Payer: Medicare Other | Source: Ambulatory Visit | Attending: Neurology | Admitting: Neurology

## 2013-08-09 DIAGNOSIS — G43009 Migraine without aura, not intractable, without status migrainosus: Secondary | ICD-10-CM | POA: Diagnosis not present

## 2013-08-09 DIAGNOSIS — R51 Headache: Secondary | ICD-10-CM | POA: Diagnosis not present

## 2013-08-09 DIAGNOSIS — E785 Hyperlipidemia, unspecified: Secondary | ICD-10-CM

## 2013-08-12 ENCOUNTER — Telehealth: Payer: Self-pay | Admitting: Neurology

## 2013-08-12 NOTE — Telephone Encounter (Signed)
Please Caucasian, MRI of the brain showed age related changes, no significant change compared to previous MRI in 2008,

## 2013-08-13 NOTE — Telephone Encounter (Signed)
Called pt to inform her per Dr. Krista Blue that her MRI of the brain showed age related changes, no significant change compared to previous MRI in 2008. I advised the pt the if she has any other problems, questions or concerns to call the office. Pt verbalized understanding.

## 2013-08-20 ENCOUNTER — Telehealth: Payer: Self-pay | Admitting: Cardiology

## 2013-08-20 NOTE — Telephone Encounter (Signed)
Patient has questions regarding refills that were denied.

## 2013-08-21 MED ORDER — CARVEDILOL 6.25 MG PO TABS: 6.2500 mg | ORAL_TABLET | Freq: Two times a day (BID) | ORAL | Status: DC

## 2013-08-21 MED ORDER — CARVEDILOL 3.125 MG PO TABS: 3.1250 mg | ORAL_TABLET | Freq: Two times a day (BID) | ORAL | Status: DC

## 2013-08-21 NOTE — Telephone Encounter (Signed)
Spoke with patient and informed her that I refilled the Coreg, she had gotten a notice saying it had been denied but there is no documentation of this.

## 2013-08-25 DIAGNOSIS — Z4789 Encounter for other orthopedic aftercare: Secondary | ICD-10-CM | POA: Diagnosis not present

## 2013-08-25 DIAGNOSIS — Z96659 Presence of unspecified artificial knee joint: Secondary | ICD-10-CM | POA: Diagnosis not present

## 2013-08-25 DIAGNOSIS — M1710 Unilateral primary osteoarthritis, unspecified knee: Secondary | ICD-10-CM | POA: Diagnosis not present

## 2013-10-01 ENCOUNTER — Other Ambulatory Visit: Payer: Self-pay

## 2013-10-01 MED ORDER — ZOLPIDEM TARTRATE 10 MG PO TABS: 10.0000 mg | ORAL_TABLET | Freq: Every day | ORAL | Status: DC

## 2013-10-02 NOTE — Telephone Encounter (Signed)
Rx has been faxed.

## 2013-10-13 DIAGNOSIS — M1710 Unilateral primary osteoarthritis, unspecified knee: Secondary | ICD-10-CM | POA: Diagnosis not present

## 2013-11-03 DIAGNOSIS — H4011X0 Primary open-angle glaucoma, stage unspecified: Secondary | ICD-10-CM | POA: Diagnosis not present

## 2013-11-10 ENCOUNTER — Encounter: Payer: Self-pay | Admitting: Adult Health

## 2013-11-10 ENCOUNTER — Ambulatory Visit (INDEPENDENT_AMBULATORY_CARE_PROVIDER_SITE_OTHER): Payer: Medicare Other | Admitting: Adult Health

## 2013-11-10 VITALS — BP 147/84 | HR 66 | Ht 65.0 in | Wt 222.0 lb

## 2013-11-10 DIAGNOSIS — G4733 Obstructive sleep apnea (adult) (pediatric): Secondary | ICD-10-CM | POA: Diagnosis not present

## 2013-11-10 DIAGNOSIS — G43909 Migraine, unspecified, not intractable, without status migrainosus: Secondary | ICD-10-CM | POA: Diagnosis not present

## 2013-11-10 DIAGNOSIS — Z9989 Dependence on other enabling machines and devices: Principal | ICD-10-CM

## 2013-11-10 DIAGNOSIS — I2510 Atherosclerotic heart disease of native coronary artery without angina pectoris: Secondary | ICD-10-CM

## 2013-11-10 MED ORDER — NORTRIPTYLINE HCL 10 MG PO CAPS: 20.0000 mg | ORAL_CAPSULE | Freq: Every day | ORAL | Status: DC

## 2013-11-10 NOTE — Patient Instructions (Signed)
Nortriptyline capsules What is this medicine? NORTRIPTYLINE (nor TRIP ti leen) is used to treat depression. This medicine may be used for other purposes; ask your health care provider or pharmacist if you have questions. COMMON BRAND NAME(S): Aventyl, Pamelor What should I tell my health care provider before I take this medicine? They need to know if you have any of these conditions: -an alcohol problem -bipolar disorder or schizophrenia -difficulty passing urine, prostate trouble -glaucoma -heart disease or recent heart attack -liver disease -over active thyroid -seizures -thoughts or plans of suicide or a previous suicide attempt or family history of suicide attempt -an unusual or allergic reaction to nortriptyline, other medicines, foods, dyes, or preservatives -pregnant or trying to get pregnant -breast-feeding How should I use this medicine? Take this medicine by mouth with a glass of water. Follow the directions on the prescription label. Take your doses at regular intervals. Do not take it more often than directed. Do not stop taking this medicine suddenly except upon the advice of your doctor. Stopping this medicine too quickly may cause serious side effects or your condition may worsen. A special MedGuide will be given to you by the pharmacist with each prescription and refill. Be sure to read this information carefully each time. Talk to your pediatrician regarding the use of this medicine in children. Special care may be needed. Overdosage: If you think you have taken too much of this medicine contact a poison control center or emergency room at once. NOTE: This medicine is only for you. Do not share this medicine with others. What if I miss a dose? If you miss a dose, take it as soon as you can. If it is almost time for your next dose, take only that dose. Do not take double or extra doses. What may interact with this medicine? Do not take this medicine with any of the  following medications: -arsenic trioxide -certain medicines medicines for irregular heart beat -cisapride -halofantrine -linezolid -MAOIs like Carbex, Eldepryl, Marplan, Nardil, and Parnate -methylene blue (injected into a vein) -other medicines for mental depression -phenothiazines like perphenazine, thioridazine and chlorpromazine -pimozide -probucol -procarbazine -sparfloxacin -St. John's Wort -ziprasidone This medicine may also interact with any of the following medications: -atropine and related drugs like hyoscyamine, scopolamine, tolterodine and others -barbiturate medicines for inducing sleep or treating seizures, such as phenobarbital -cimetidine -medicines for diabetes -medicines for seizures like carbamazepine or phenytoin -reserpine -thyroid medicine This list may not describe all possible interactions. Give your health care provider a list of all the medicines, herbs, non-prescription drugs, or dietary supplements you use. Also tell them if you smoke, drink alcohol, or use illegal drugs. Some items may interact with your medicine. What should I watch for while using this medicine? Tell your doctor if your symptoms do not get better or if they get worse. Visit your doctor or health care professional for regular checks on your progress. Because it may take several weeks to see the full effects of this medicine, it is important to continue your treatment as prescribed by your doctor. Patients and their families should watch out for new or worsening thoughts of suicide or depression. Also watch out for sudden changes in feelings such as feeling anxious, agitated, panicky, irritable, hostile, aggressive, impulsive, severely restless, overly excited and hyperactive, or not being able to sleep. If this happens, especially at the beginning of treatment or after a change in dose, call your health care professional. You may get drowsy or dizzy. Do not drive,   use machinery, or do  anything that needs mental alertness until you know how this medicine affects you. Do not stand or sit up quickly, especially if you are an older patient. This reduces the risk of dizzy or fainting spells. Alcohol may interfere with the effect of this medicine. Avoid alcoholic drinks. Do not treat yourself for coughs, colds, or allergies without asking your doctor or health care professional for advice. Some ingredients can increase possible side effects. Your mouth may get dry. Chewing sugarless gum or sucking hard candy, and drinking plenty of water may help. Contact your doctor if the problem does not go away or is severe. This medicine may cause dry eyes and blurred vision. If you wear contact lenses you may feel some discomfort. Lubricating drops may help. See your eye doctor if the problem does not go away or is severe. This medicine can cause constipation. Try to have a bowel movement at least every 2 to 3 days. If you do not have a bowel movement for 3 days, call your doctor or health care professional. This medicine can make you more sensitive to the sun. Keep out of the sun. If you cannot avoid being in the sun, wear protective clothing and use sunscreen. Do not use sun lamps or tanning beds/booths. What side effects may I notice from receiving this medicine? Side effects that you should report to your doctor or health care professional as soon as possible: -allergic reactions like skin rash, itching or hives, swelling of the face, lips, or tongue -abnormal production of milk in females -breast enlargement in both males and females -breathing problems -confusion, hallucinations -fever with increased sweating -irregular or fast, pounding heartbeat -muscle stiffness, or spasms -pain or difficulty passing urine, loss of bladder control -seizures -suicidal thoughts or other mood changes -swelling of the testicles -tingling, pain, or numbness in the feet or hands -yellowing of the eyes or  skin Side effects that usually do not require medical attention (report to your doctor or health care professional if they continue or are bothersome): -change in sex drive or performance -diarrhea -nausea, vomiting -weight gain or loss This list may not describe all possible side effects. Call your doctor for medical advice about side effects. You may report side effects to FDA at 1-800-FDA-1088. Where should I keep my medicine? Keep out of the reach of children. Store at room temperature between 15 and 30 degrees C (59 and 86 degrees F). Keep container tightly closed. Throw away any unused medicine after the expiration date. NOTE: This sheet is a summary. It may not cover all possible information. If you have questions about this medicine, talk to your doctor, pharmacist, or health care provider.  2015, Elsevier/Gold Standard. (2011-06-19 13:57:12)  

## 2013-11-10 NOTE — Progress Notes (Signed)
PATIENT: Connie Owens DOB: Apr 09, 1945  REASON FOR VISIT: follow up HISTORY FROM: patient  HISTORY OF PRESENT ILLNESS: Connie Owens is a 68 year old female with a history of migraines and OSA on CPAP. She is receiving botox injections for headaches. She felt that the injections were not helpful so she was started on nortriptyline 10 mg at night. She states that she is unsure if this medication has helped because her husband passed away in 09-23-22. She states that she has not been sleeping well. She continues to use her Ambien.  She continues to have daily headaches, she has the pain on the left side of the head. She states that she often gets a headache when going to bed and off and on during the day. She states that her headache pain usually averages 6-7 on a scale of 1-10. She does have some "eye issues." She has had a cornea transplant. She sees a doctor at [facility] for this.   HISTORY 08/01/13 (YY): 68 y.o. female here as a referral from Dr. Brett Fairy for evaluation of chronic headaches.  She has a past medical history of of obstructive sleep apnea, using CPAP machine, also has past medical history of coronary artery disease, corneal disorder, Fuch's dystrophy, status post bilateral corneal transplant, anxiety, hypertension, hyperlipidemia, migraine headaches  She presented with chronic headaches since January 2013, she described almost constant daily left temporal frontal area pressure headaches, throbbing pain, intermixed with sharp piercing pain, lasting for a few seconds, 8 out of 10, she denies visual loss, she denies light and noise sensitivity,  She has tried Cardizem calcium channel blocker, no change in her headache, she is also taking gabapentin 100 mg 3 times a day, with no significant improvement,  She is taking Tylenol, occasionally oxycodone, aspirin, without significant help either.atient having severe palpitations.  CRP and sedimentation rate were in normal range. The patient also  had a Doppler study for a temporal arteritis., which was negative.  She began to receive Botox injection as her chronic migraine prevention since July 2014,  UPDATE June 19th 2015:  Last Botox injection was in April 2015,. She was not sure if that has helped her headaches, she continued to have frequent pressure sensation at her left side, always on left side, she denies visual loss, no lateralized motor or sensory deficit   REVIEW OF SYSTEMS: Full 14 system review of systems performed and notable only for:  Constitutional: N/A  Eyes: blurred vision Ear/Nose/Throat: ringing in ears  Skin: N/A  Cardiovascular: N/A  Respiratory: N/A  Gastrointestinal: diarrhea  Genitourinary: N/A Hematology/Lymphatic: N/A  Endocrine: N/A Musculoskeletal: joint pain, walking difficulty Allergy/Immunology: N/A  Neurological: headache Psychiatric: N/A Sleep: insomnia   ALLERGIES: Allergies  Allergen Reactions  . Iohexol     rash  . Iodine   . Tape     Plastic tape  . Imdur [Isosorbide]     Severe headaches  . Latex Rash  . Verapamil     REACTION: dizziness    HOME MEDICATIONS: Outpatient Prescriptions Prior to Visit  Medication Sig Dispense Refill  . aspirin 81 MG tablet Take 81 mg by mouth daily.      . brimonidine-timolol (COMBIGAN) 0.2-0.5 % ophthalmic solution Apply to eye.      . Carboxymethylcellulose Sodium (LUBRICANT EYE DROPS OP) Apply 1 drop to eye daily as needed. For dry or itchy eyes.       . carvedilol (COREG) 3.125 MG tablet Take 1 tablet (3.125 mg total) by  mouth 2 (two) times daily.  180 tablet  3  . carvedilol (COREG) 6.25 MG tablet Take 1 tablet (6.25 mg total) by mouth 2 (two) times daily.  180 tablet  3  . cholecalciferol (VITAMIN D) 1000 UNITS tablet Take 2,000 Units by mouth daily.      . clobetasol ointment (TEMOVATE) 3.50 % Apply 1 application topically 2 (two) times daily.  30 g  5  . clopidogrel (PLAVIX) 75 MG tablet Take 1 tablet (75 mg total) by mouth daily  with breakfast.  90 tablet  3  . esomeprazole (NEXIUM) 40 MG capsule Take 40 mg by mouth daily before breakfast.      . estradiol (ESTRACE) 1 MG tablet Take 1 mg by mouth daily.        [name] loperamide (IMODIUM) 2 MG capsule Take 2 mg by mouth 4 (four) times daily as needed. For loose stool      . losartan (COZAAR) 50 MG tablet Take 1 tablet (50 mg total) by mouth daily. 1 by mouth once daily  90 tablet  3  . nitroGLYCERIN (NITROSTAT) 0.4 MG SL tablet Place 1 tablet (0.4 mg total) under the tongue every 5 (five) minutes x 3 doses as needed for chest pain.  25 tablet  prn  . nortriptyline (PAMELOR) 10 MG capsule Take 1 capsule (10 mg total) by mouth at bedtime.  30 capsule  12  . pravastatin (PRAVACHOL) 80 MG tablet Take 1 tablet (80 mg total) by mouth every evening.  90 tablet  3  . prednisoLONE acetate (PRED FORTE) 1 % ophthalmic suspension Place 1 drop into both eyes 2 (two) times daily.       . psyllium (METAMUCIL) 58.6 % powder Take 1 packet by mouth as needed. For regularity      . zolpidem (AMBIEN) 10 MG tablet Take 1 tablet (10 mg total) by mouth at bedtime.  90 tablet  1  . gabapentin (NEURONTIN) 100 MG capsule Take 1 capsule (100 mg total) by mouth every 8 (eight) hours as needed. For pain  270 capsule  1   Facility-Administered Medications Prior to Visit  Medication Dose Route Frequency Provider Last Rate Last Dose  . botulinum toxin Type A (BOTOX) injection 150 Units  150 Units Intramuscular Once Marcial Pacas, MD        PAST MEDICAL HISTORY: Past Medical History  Diagnosis Date  . CAD (coronary artery disease)     NSTEMI 8/13 => LHC 09/27/11: oLM 50%, dLM 30%, oLAD 60-70%, pLAD 50%, mLAD 70%, pD1 30%, pD2 40%, oRCA 25%, mRCA 40%, EF 50% with ant HK => LAD not amenable to PCI => med Rx  unless recurrent angina = > consider CABG  . Ischemic cardiomyopathy     a.  Echo 09/29/11: EF 30-35% with inferior, posterior, lateral AK, anterior severe HK, mild LVH, mild MR, PASP 40;  => b. follow up  echo 10/13:  EF 55-60%, Gr 1 diast dysfn, mild MR  . Corneal disorder   . Postmenopausal status   . Back pain   . Dysmetabolic syndrome X   . Arthritis     osteo...right knee  . Carpal tunnel syndrome   . GERD (gastroesophageal reflux disease)   . Diverticular disease   . Hx of adenomatous colonic polyps   . Hernia     hiatal  . Anxiety state     NOS  . Hypertension     essen. NOS  . Shoulder fracture   . HLD (hyperlipidemia)   .  Migraine headache without aura 07/23/2012     Left sided  Lamonte Sakai thought to be migrainous  , hypersensitivity to touch , photophobia ,  no nausea.   . Headache, paroxysmal hemicrania, chronic 07/23/2012     Referral to BOTOX evaluation with dr Rexene Alberts or Krista Blue .  [name] Heart attack 09/2011  . 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     PAST SURGICAL HISTORY: Past Surgical History  Procedure Laterality Date  . Appendectomy    . Cholecystectomy    . Abdominal hysterectomy    . Breast lumpectomy Right 1996  . Colonoscopy w/ polypectomy    . Lumbar spur removed    . Total knee arthroplasty Right   . Bunionectomy Right   . Ankle cystectomy Right   . Rectocele surgery    . 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 Hemorrhoid surgery  12/01/2010, 2014    [place] hemorrhoid ligation/pexy  . Cardiac catheterization  09/28/2011    Dr Acie Fredrickson(  to see Dr Ron Parker)  . 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 Corneal transplant Left     x1    FAMILY HISTORY: Family History  Problem Relation Age of Onset  . Breast cancer Mother   . Heart disease Father   . Stroke Father   . Dementia Father   . Prostate cancer Father   . Diabetes Sister     3 sisters   . Heart disease Sister     1 sister CABG  . Breast cancer Sister     PTE pre & post Dx of ca  . Diabetes Paternal Grandfather   . Colon cancer Neg Hx   . Breast cancer Sister     SOCIAL HISTORY: History   Social History  . Marital Status:  Widowed    Spouse Name: Ruthann Cancer    Number of Children: 3  . Years of Education: 14   Occupational History  . retired   .     Social History Main Topics  . Smoking status: Never Smoker   . Smokeless tobacco: Never Used  . Alcohol Use: Yes     Comment: rare  . Drug Use: No  . Sexual Activity: Not on file   Other Topics Concern  . Not on file   Social History Narrative   Patient is widowed,([place] past 10/02/13) and lives at home with her husband.   Patient has three adult children.   Patient has a college education.   Patient is right-handed.   Patient drinks two cups of coffee daily.   Regular exercise   Patient is retired.      PHYSICAL EXAM  Filed Vitals:   11/10/13 1115  BP: 147/84  Pulse: 66  Height: 5\' 5"  (1.651 m)  Weight: 222 lb (100.699 kg)   Body mass index is 36.94 kg/(m^2).  Generalized: Well developed, in no acute distress   Neurological examination  Mentation: Alert oriented to time, place, history taking. Follows all commands speech and language fluent Cranial nerve II-XII: Pupils were equal round reactive to light. Extraocular movements were full, visual field were full on confrontational test. Facial sensation and strength were normal.Head turning and shoulder shrug  were normal and symmetric. Motor: The motor testing reveals 5 over 5 strength of all 4 extremities. Good symmetric motor tone is noted throughout.  Sensory: Sensory testing is intact to soft touch on all 4 extremities. No evidence of  extinction is noted.  Coordination: Cerebellar testing reveals good finger-nose-finger and heel-to-shin bilaterally.  Gait and station: Gait is normal. Tandem gait not attempted. Romberg is negative. No drift is seen.  Reflexes: Deep tendon reflexes are symmetric and normal bilaterally.    DIAGNOSTIC DATA (LABS, IMAGING, TESTING) - I reviewed patient records, labs, notes, testing and imaging myself where available.  Lab Results  Component Value Date    WBC 7.5 03/18/2013   HGB 13.6 03/18/2013   HCT 41.8 03/18/2013   MCV 93.4 03/18/2013   PLT 278.0 03/18/2013      Component Value Date/Time   NA 137 03/18/2013 1416   K 4.3 03/18/2013 1416   CL 105 03/18/2013 1416   CO2 26 03/18/2013 1416   GLUCOSE 92 03/18/2013 1416   BUN 14 03/18/2013 1416   CREATININE 0.7 03/18/2013 1416   CALCIUM 9.3 03/18/2013 1416   PROT 7.1 03/18/2013 1416   ALBUMIN 3.6 03/18/2013 1416   AST 18 03/18/2013 1416   ALT 16 03/18/2013 1416   ALKPHOS 58 03/18/2013 1416   BILITOT 0.4 03/18/2013 1416   GFRNONAA 86* 09/30/2011 0610   GFRAA >90 09/30/2011 0610   Lab Results  Component Value Date   CHOL 204* 03/18/2013   HDL 46.70 03/18/2013   LDLCALC 80 11/28/2011   LDLDIRECT 140.8 03/18/2013   TRIG 166.0* 03/18/2013   CHOLHDL 4 03/18/2013   Lab Results  Component Value Date   HGBA1C 6.0* 09/28/2011   Lab Results  Component Value Date   TKPTWSFK81 275 03/18/2013   Lab Results  Component Value Date   TSH 1.93 07/26/2012      ASSESSMENT AND PLAN 68 y.o. year old female  has a past medical history of CAD (coronary artery disease); Ischemic cardiomyopathy; Corneal disorder; Postmenopausal status; Back pain; Dysmetabolic syndrome X; Arthritis; Carpal tunnel syndrome; GERD (gastroesophageal reflux disease); Diverticular disease; adenomatous colonic polyps; Hernia; Anxiety state; Hypertension; Shoulder fracture; HLD (hyperlipidemia); Migraine headache without aura (07/23/2012); Headache, paroxysmal hemicrania, chronic (07/23/2012); Heart attack (09/2011); and Glaucoma (increased eye pressure) (07/30/2013). here with:  1. Migraines  I will increase nortriptyline to 20 mg daily. If she does not notice any benefit with the increase she should let us know. In the future topamax may be considered. The patient has also been under increased stress with the passing of her husband. Patient will follow-up in 3-4 months or sooner if needed.    Ward Givens, MSN, NP-C 11/10/2013, 11:21 AM [facility] [address] [place], [place] [address] [phone]  Note: This document was prepared with digital dictation and possible smart phrase technology. Any transcriptional errors that result from this process are unintentional.

## 2013-11-11 DIAGNOSIS — Z5189 Encounter for other specified aftercare: Secondary | ICD-10-CM | POA: Diagnosis not present

## 2013-11-18 DIAGNOSIS — Z947 Corneal transplant status: Secondary | ICD-10-CM | POA: Diagnosis not present

## 2013-11-21 DIAGNOSIS — N760 Acute vaginitis: Secondary | ICD-10-CM | POA: Diagnosis not present

## 2013-11-21 DIAGNOSIS — N952 Postmenopausal atrophic vaginitis: Secondary | ICD-10-CM | POA: Diagnosis not present

## 2013-12-15 ENCOUNTER — Telehealth: Payer: Self-pay | Admitting: Adult Health

## 2013-12-15 MED ORDER — NORTRIPTYLINE HCL 10 MG PO CAPS: 30.0000 mg | ORAL_CAPSULE | Freq: Every day | ORAL | Status: DC

## 2013-12-15 NOTE — Telephone Encounter (Signed)
Error

## 2013-12-15 NOTE — Telephone Encounter (Signed)
Patient stated Rx nortriptyline (PAMELOR) 10 MG capsule did not help with headaches.  Questioning if Connie Blossom, NP was prescribing an alternative medication.  Please call and advise.

## 2013-12-15 NOTE — Telephone Encounter (Signed)
I called the patient. She continues to have daily headaches. She did increase in nortriptyline to 20 mg at bedtime. She feels that the nortriptyline has helped some but has not decreased the frequency of her headaches. She does feel that the nortriptyline has helped keep her calm. Her husband passed away this year. I will increase the nortriptyline to 30 mg at bedtime. She will let us know if she does not find this beneficial for her headaches.

## 2013-12-22 DIAGNOSIS — L821 Other seborrheic keratosis: Secondary | ICD-10-CM | POA: Diagnosis not present

## 2013-12-27 DIAGNOSIS — S83411D Sprain of medial collateral ligament of right knee, subsequent encounter: Secondary | ICD-10-CM | POA: Diagnosis not present

## 2013-12-27 DIAGNOSIS — Z96651 Presence of right artificial knee joint: Secondary | ICD-10-CM | POA: Diagnosis not present

## 2013-12-27 DIAGNOSIS — M25562 Pain in left knee: Secondary | ICD-10-CM | POA: Diagnosis not present

## 2013-12-27 DIAGNOSIS — M1712 Unilateral primary osteoarthritis, left knee: Secondary | ICD-10-CM | POA: Diagnosis not present

## 2013-12-29 ENCOUNTER — Encounter: Payer: Self-pay | Admitting: Family Medicine

## 2013-12-29 ENCOUNTER — Ambulatory Visit (INDEPENDENT_AMBULATORY_CARE_PROVIDER_SITE_OTHER): Payer: Medicare Other | Admitting: Family Medicine

## 2013-12-29 VITALS — BP 134/80 | HR 59 | Temp 97.8°F | Wt 215.6 lb

## 2013-12-29 DIAGNOSIS — Z833 Family history of diabetes mellitus: Secondary | ICD-10-CM

## 2013-12-29 DIAGNOSIS — I2510 Atherosclerotic heart disease of native coronary artery without angina pectoris: Secondary | ICD-10-CM | POA: Diagnosis not present

## 2013-12-29 DIAGNOSIS — R682 Dry mouth, unspecified: Secondary | ICD-10-CM

## 2013-12-29 DIAGNOSIS — R739 Hyperglycemia, unspecified: Secondary | ICD-10-CM

## 2013-12-29 DIAGNOSIS — Z23 Encounter for immunization: Secondary | ICD-10-CM | POA: Diagnosis not present

## 2013-12-29 DIAGNOSIS — M792 Neuralgia and neuritis, unspecified: Secondary | ICD-10-CM | POA: Diagnosis not present

## 2013-12-29 DIAGNOSIS — E785 Hyperlipidemia, unspecified: Secondary | ICD-10-CM | POA: Diagnosis not present

## 2013-12-29 LAB — HEPATIC FUNCTION PANEL
ALT: 14 U/L (ref 0–35)
AST: 15 U/L (ref 0–37)
Albumin: 4 g/dL (ref 3.5–5.2)
Alkaline Phosphatase: 55 U/L (ref 39–117)
BILIRUBIN DIRECT: 0 mg/dL (ref 0.0–0.3)
Total Bilirubin: 0.4 mg/dL (ref 0.2–1.2)
Total Protein: 7.6 g/dL (ref 6.0–8.3)

## 2013-12-29 LAB — LIPID PANEL
CHOL/HDL RATIO: 5
Cholesterol: 234 mg/dL — ABNORMAL HIGH (ref 0–200)
HDL: 50.4 mg/dL (ref >39.00)
LDL CALC: 155 mg/dL — AB (ref 0–99)
NonHDL: 183.6
Triglycerides: 141 mg/dL (ref 0.0–149.0)
VLDL: 28.2 mg/dL (ref 0.0–40.0)

## 2013-12-29 LAB — BASIC METABOLIC PANEL
BUN: 13 mg/dL (ref 6–23)
CHLORIDE: 101 meq/L (ref 96–112)
CO2: 26 mEq/L (ref 19–32)
Calcium: 9.7 mg/dL (ref 8.4–10.5)
Creatinine, Ser: 0.8 mg/dL (ref 0.4–1.2)
GFR: 77.9 mL/min (ref >60.00)
Glucose, Bld: 103 mg/dL — ABNORMAL HIGH (ref 70–99)
Potassium: 4.8 mEq/L (ref 3.5–5.1)
Sodium: 136 mEq/L (ref 135–145)

## 2013-12-29 LAB — TSH: TSH: 0.84 u[IU]/mL (ref 0.35–4.50)

## 2013-12-29 LAB — HEMOGLOBIN A1C: Hgb A1c MFr Bld: 5.9 % (ref 4.6–6.5)

## 2013-12-29 MED ORDER — GABAPENTIN 100 MG PO CAPS: ORAL_CAPSULE | ORAL | Status: DC

## 2013-12-29 NOTE — Patient Instructions (Signed)

## 2013-12-29 NOTE — Progress Notes (Signed)
Pre visit review using our clinic review tool, if applicable. No additional management support is needed unless otherwise documented below in the visit note. 

## 2013-12-29 NOTE — Progress Notes (Signed)
Subjective:     HPI: Connie Owens is a 68 y.o. female here for follow up of dyslipidemia. The patient does not use medications that may worsen dyslipidemias (corticosteroids, progestins, anabolic steroids, diuretics, beta-blockers, amiodarone, cyclosporine, olanzapine). The patient exercises frequently. The patient is known to have coexisting coronary artery disease.  Pt is also here to f/u bp-- no chest pain, sob, palp or dizziness. She is also c/o dry mouth and her eye dr told her to be checked for dm.  She has an extensive family history of DM.  Pt also c/o R knee pain and she saw Dr Melvyn Novas Sat and an injection was done.  Pt just wants second opinion Review of Systems: Review of Systems - Negative except c/o R knee pain-- she saw Dr Melvyn Novas yesterday and was given a cortisone injection      Objective:    Filed Vitals:   12/29/13 0956  BP: 134/80  Pulse: 59  Temp: 97.8 F (36.6 C)    Physical Exam: General Appearance:  awake, alert, oriented, in no acute distress and well developed, well nourished Neck:  neck- supple, no mass, non-tender Lungs:  Normal expansion.  Clear to auscultation.  No rales, rhonchi, or wheezing. Heart:  Heart sounds are normal.  Regular rate and rhythm without murmur, gallop or rub. Extremities: Extremities warm to touch, pink, with no edema. and R knee--some swelling much improved since injection per pt                Pain with weight bearing   Lab Review Lab Results  Component Value Date   CHOL 204* 03/18/2013   CHOL 152 11/28/2011   CHOL 190 09/28/2011   HDL 46.70 03/18/2013   HDL 45.40 11/28/2011   HDL 53 09/28/2011   LDLDIRECT 140.8 03/18/2013   LDLDIRECT 146.7 12/11/2008       Assessment:     Problem List Items Addressed This Visit    Hyperlipidemia - Primary   Relevant Orders      Basic metabolic panel      Hepatic function panel      Lipid panel      Hemoglobin A1c      TSH    Other Visit Diagnoses    Family  history of diabetes mellitus        Relevant Orders       Basic metabolic panel       Hepatic function panel       Lipid panel       Hemoglobin A1c       TSH    Dry mouth        Relevant Orders       Basic metabolic panel       Hepatic function panel       Lipid panel       Hemoglobin A1c       TSH    Hyperglycemia        Relevant Orders       Hemoglobin A1c    Neuropathic pain        Relevant Medications       gabapentin (NEURONTIN) capsule           Plan:    The following changes are planned for the next 6 months, at which time the patient will return for repeat fasting lipids:  1. Dietary recommendations: Reduce saturated fat, "trans" monounsaturated fatty acids, and cholesterol 2. Exercise recommendations:  At least 25 minutes of  vigorous aerobic activity at least 3 days per week for a total of 75 minutes 3. Other treatment: Weight reduction (-)  Return in about 6 months (around 06/29/2014), or if symptoms worsen or fail to improve.    1. Family history of diabetes mellitus Check labs, follow low carb diet - Basic metabolic panel - Hepatic function panel - Lipid panel - Hemoglobin A1c - TSH  2. Hyperlipidemia Check labs, con't meds - Basic metabolic panel - Hepatic function panel - Lipid panel - Hemoglobin A1c - TSH  3. Dry mouth  - Basic metabolic panel - Hepatic function panel - Lipid panel - Hemoglobin A1c - TSH  4. Hyperglycemia  - Hemoglobin A1c  5. Neuropathic pain  - gabapentin (NEURONTIN) 100 MG capsule; 2 po tid for pain  Dispense: 540 capsule; Refill: 1  6. R knee pain--- per ortho, pt sees Dr Melvyn Novas

## 2014-01-13 ENCOUNTER — Other Ambulatory Visit: Payer: Self-pay

## 2014-01-13 DIAGNOSIS — Z1231 Encounter for screening mammogram for malignant neoplasm of breast: Secondary | ICD-10-CM

## 2014-01-22 ENCOUNTER — Encounter (HOSPITAL_COMMUNITY): Payer: Self-pay | Admitting: Cardiology

## 2014-02-16 ENCOUNTER — Ambulatory Visit
Admission: RE | Admit: 2014-02-16 | Discharge: 2014-02-16 | Disposition: A | Discharge status: Home / Self Care | Payer: Medicare Other | Source: Ambulatory Visit

## 2014-02-16 DIAGNOSIS — Z1231 Encounter for screening mammogram for malignant neoplasm of breast: Secondary | ICD-10-CM | POA: Diagnosis not present

## 2014-02-23 DIAGNOSIS — H4011X1 Primary open-angle glaucoma, mild stage: Secondary | ICD-10-CM | POA: Diagnosis not present

## 2014-03-14 DIAGNOSIS — M1712 Unilateral primary osteoarthritis, left knee: Secondary | ICD-10-CM | POA: Diagnosis not present

## 2014-03-16 DIAGNOSIS — M1712 Unilateral primary osteoarthritis, left knee: Secondary | ICD-10-CM | POA: Diagnosis not present

## 2014-03-26 ENCOUNTER — Other Ambulatory Visit: Payer: Self-pay | Admitting: Neurology

## 2014-03-26 MED ORDER — ZOLPIDEM TARTRATE 10 MG PO TABS: 10.0000 mg | ORAL_TABLET | Freq: Every day | ORAL | Status: DC

## 2014-03-26 NOTE — Telephone Encounter (Signed)
Patient is calling to get a Rx for generic Ambien 90 day supply called to [facility] in [place].

## 2014-03-27 NOTE — Telephone Encounter (Signed)
Rx signed and faxed.

## 2014-04-03 DIAGNOSIS — M7071 Other bursitis of hip, right hip: Secondary | ICD-10-CM | POA: Diagnosis not present

## 2014-04-03 DIAGNOSIS — Z96651 Presence of right artificial knee joint: Secondary | ICD-10-CM | POA: Diagnosis not present

## 2014-04-03 DIAGNOSIS — M7051 Other bursitis of knee, right knee: Secondary | ICD-10-CM | POA: Diagnosis not present

## 2014-04-16 DIAGNOSIS — M7051 Other bursitis of knee, right knee: Secondary | ICD-10-CM | POA: Diagnosis not present

## 2014-04-21 DIAGNOSIS — M7051 Other bursitis of knee, right knee: Secondary | ICD-10-CM | POA: Diagnosis not present

## 2014-04-24 DIAGNOSIS — M7051 Other bursitis of knee, right knee: Secondary | ICD-10-CM | POA: Diagnosis not present

## 2014-04-28 DIAGNOSIS — M7051 Other bursitis of knee, right knee: Secondary | ICD-10-CM | POA: Diagnosis not present

## 2014-04-30 ENCOUNTER — Ambulatory Visit (INDEPENDENT_AMBULATORY_CARE_PROVIDER_SITE_OTHER): Payer: Medicare Other | Admitting: Adult Health

## 2014-04-30 ENCOUNTER — Ambulatory Visit: Payer: Medicare Other | Admitting: Adult Health

## 2014-04-30 ENCOUNTER — Encounter: Payer: Self-pay | Admitting: Adult Health

## 2014-04-30 VITALS — BP 137/76 | HR 70 | Ht 65.0 in | Wt 218.0 lb

## 2014-04-30 DIAGNOSIS — G43009 Migraine without aura, not intractable, without status migrainosus: Secondary | ICD-10-CM | POA: Diagnosis not present

## 2014-04-30 DIAGNOSIS — G4733 Obstructive sleep apnea (adult) (pediatric): Secondary | ICD-10-CM

## 2014-04-30 DIAGNOSIS — Z9989 Dependence on other enabling machines and devices: Principal | ICD-10-CM

## 2014-04-30 NOTE — Progress Notes (Signed)
I agree with the assessment and plan as directed by NP .The patient is known to me .   Zaria Taha, MD  

## 2014-04-30 NOTE — Progress Notes (Signed)
PATIENT: Connie Owens DOB: Nov 30, 1945  REASON FOR VISIT: follow up- migraines, sleep apnea on CPAP HISTORY FROM: patient  HISTORY OF PRESENT ILLNESS: Connie Owens is a 69 year old female with history of obstructive sleep Apnea and migraines. She returns today for a 90 day compliance download. She brought her machine with her today and the reports shows an AHI of 0.3at 6 cm of water, uses her machine for 5 hours and 57 minutes a night, with 100 % compliance. She uses her machine for greater than 4 hours 74 out of 90 days with compliance of 82%. Her Epworth score is 7 points was previously 4 points. Her fatigue severity score is 41 was previously 53.. Patient reports that she gets about 9 hours of sleep a night. She goes to bed around 11PM and arises at 8:15AM. She denies having trouble falling asleep or staying a sleep. States that the she gets up about 1-2 times a night to urinate. Overall patient feels that CPAP has improved her sleepiness and fatigue. The patient states that her migraines have improved. She continues to take nortriptyline 30 mg at bedtime. She has approximately 1-2 headaches per week. She will occassionally have sharp pain that is on the left side of the head that last for seconds and then will resolve. Overall she feels that her headaches have improved. Patient continues to suffer from anxiety and depression due to the loss of her husband. They were married 71 years.  HISTORY 07/30/13 [facility]):  Connie Owens is a 69 y.o. female here as a referral from Dr. Linna Darner for follow up on her mild sleep apnea,   Patient was diagnosed with apnea followed by a CPAP titration on 05-21-12 and she responded very well to only 6 cm water pressure applied by a nasal mask. She is seen today for a compliance followup she has a mild air leak the residual AHI of only 0.4 at 6 cm water pressure and an average use of the CPAP machine at 4 hours and 39 minutes compliance in use days was 86/90  days- the patient was not able to take her machine on an unexpected family emergency and had to leave.  She likes the machine , feels better when using it, her Epworth score is now at 4 points, and FSS was 53 points, she stll has a dry mouth.  She just had her DME adjust the humidity levels. She uses biotin mouth wash.  I offer a chin strap through DME. [place].     Past history :   Titrated to 5 6 cm water, 100% compliant over the last 30 days.  She was asked by [facility] care to provide data for her CPAP. She has not noticed an positive effect on her headaches at this time and she has been using the machine now for 8 weeks. She continues with his atypical facial pain that has some migrainous component, she has a history of chronic insomnia for which the prescribed medication on the second of January 2013. Since , she has been using Ambien and was happy so far with good effect .  In August 2013 the patient suffered a myocardial infarction / myocardial event and she said that at the time she had also multiple other aches and pains that were dividing her attention. Dr. Linna Darner had seen the patient just in May 2014 of this year he had ordered a cardiac telemetry for the patient . The portable monitor her was ordered in response  to the patient having severe palpitations. She was diagnosed with apnea, brady tachy arrhythmia.   Patient has seen Dr. Krista Blue for Botox injection, which thus far have not influenced her headache pattern. She reports scalp tenderness.   Due to her coronary artery disease I would not like this patient to use NSAID . Dr. Gladstone Lighter, her orthopedist just last week placed her on Mobic for right knee swelling and painful right knee. I would like still for the patient to remain off any additional nonsteroidals and she agrees with the referral for a Botox evaluation and possible injection. She is to confirm with her cardiologist that this is OK. In the past I have attempted to treat  the patient with Neurontin( gabapentin) but it had nor did no effect on her head pain. And I have tried a calcium channel blocker, namely Cardizem 30 mg tablets at night ,which had also no positive effect.  Again the home sleep test and the apnea evaluation was ordered after the patient developed her headaches, facial pain, tinnitus. Please note that jaw claudication was not observed and CRP and sedimentation rate were in normal range.  The patient also had a Doppler study for a temporal arteritis., which was negative.   She takes daily 1 hour daily naps and feels refreshed, she normally feels refreshed when waking up in the morning. Her bedtime is from midnight on up to 8 AM and there is very little variation between weekend and weekdays she sometimes has poor asleep because of joint pain and myalgia.  Headaches became a problem in January 2014- she had one fall did not injure her head but does developed a temporal trigger point on the left side of her head area of numbness, achiness around it. Next is another arisen from the right side.    REVIEW OF SYSTEMS: Out of a complete 14 system review of symptoms, the patient complains only of the following symptoms, and all other reviewed systems are negative.  Blurred vision, ringing in ears, insomnia, diarrhea, joint pain, aching muscles, headache  ALLERGIES: Allergies  Allergen Reactions  . Iohexol     rash  . Iodine   . Tape     Plastic tape  . Imdur [Isosorbide]     Severe headaches  . Latex Rash  . Verapamil     REACTION: dizziness    HOME MEDICATIONS: Outpatient Prescriptions Prior to Visit  Medication Sig Dispense Refill  . aspirin 81 MG tablet Take 81 mg by mouth daily.    . brimonidine-timolol (COMBIGAN) 0.2-0.5 % ophthalmic solution Apply to eye.    . Carboxymethylcellulose Sodium (LUBRICANT EYE DROPS OP) Apply 1 drop to eye daily as needed. For dry or itchy eyes.     . carvedilol (COREG) 3.125 MG tablet Take 1  tablet (3.125 mg total) by mouth 2 (two) times daily. 180 tablet 3  . carvedilol (COREG) 6.25 MG tablet Take 1 tablet (6.25 mg total) by mouth 2 (two) times daily. 180 tablet 3  . cholecalciferol (VITAMIN D) 1000 UNITS tablet Take 2,000 Units by mouth daily.    . clobetasol ointment (TEMOVATE) 3.47 % Apply 1 application topically 2 (two) times daily. 30 g 5  . clopidogrel (PLAVIX) 75 MG tablet Take 1 tablet (75 mg total) by mouth daily with breakfast. 90 tablet 3  . esomeprazole (NEXIUM) 40 MG capsule Take 40 mg by mouth daily before breakfast.    . estradiol (ESTRACE) 1 MG tablet Take 1 mg by mouth daily.      [name]  gabapentin (NEURONTIN) 100 MG capsule 2 po tid for pain 540 capsule 1  . HYDROcodone-acetaminophen (NORCO/VICODIN) 5-325 MG per tablet Take by mouth as needed.    . loperamide (IMODIUM) 2 MG capsule Take 2 mg by mouth 4 (four) times daily as needed. For loose stool    . losartan (COZAAR) 50 MG tablet Take 1 tablet (50 mg total) by mouth daily. 1 by mouth once daily 90 tablet 3  . nitroGLYCERIN (NITROSTAT) 0.4 MG SL tablet Place 1 tablet (0.4 mg total) under the tongue every 5 (five) minutes x 3 doses as needed for chest pain. 25 tablet prn  . nortriptyline (PAMELOR) 10 MG capsule Take 3 capsules (30 mg total) by mouth at bedtime. 270 capsule 3  . oxyCODONE-acetaminophen (PERCOCET) 10-325 MG per tablet     . pravastatin (PRAVACHOL) 80 MG tablet Take 1 tablet (80 mg total) by mouth every evening. 90 tablet 3  . prednisoLONE acetate (PRED FORTE) 1 % ophthalmic suspension Place 1 drop into both eyes 2 (two) times daily.     . predniSONE (STERAPRED UNI-PAK) 5 MG TABS tablet     . psyllium (METAMUCIL) 58.6 % powder Take 1 packet by mouth as needed. For regularity    . zolpidem (AMBIEN) 10 MG tablet Take 1 tablet (10 mg total) by mouth at bedtime. 90 tablet 1   Facility-Administered Medications Prior to Visit  Medication Dose Route Frequency Provider Last Rate Last Dose  . botulinum toxin  Type A (BOTOX) injection 150 Units  150 Units Intramuscular Once Marcial Pacas, MD        PAST MEDICAL HISTORY: Past Medical History  Diagnosis Date  . CAD (coronary artery disease)     NSTEMI 8/13 => LHC 09/27/11: oLM 50%, dLM 30%, oLAD 60-70%, pLAD 50%, mLAD 70%, pD1 30%, pD2 40%, oRCA 25%, mRCA 40%, EF 50% with ant HK => LAD not amenable to PCI => med Rx  unless recurrent angina = > consider CABG  . Ischemic cardiomyopathy     a.  Echo 09/29/11: EF 30-35% with inferior, posterior, lateral AK, anterior severe HK, mild LVH, mild MR, PASP 40;  => b. follow up echo 10/13:  EF 55-60%, Gr 1 diast dysfn, mild MR  . Corneal disorder   . Postmenopausal status   . Back pain   . Dysmetabolic syndrome X   . Arthritis     osteo...right knee  . Carpal tunnel syndrome   . GERD (gastroesophageal reflux disease)   . Diverticular disease   . Hx of adenomatous colonic polyps   . Hernia     hiatal  . Anxiety state     NOS  . Hypertension     essen. NOS  . Shoulder fracture   . HLD (hyperlipidemia)   . Migraine headache without aura 07/23/2012     Left sided  Lamonte Sakai thought to be migrainous  , hypersensitivity to touch , photophobia ,  no nausea.   . Headache, paroxysmal hemicrania, chronic 07/23/2012     Referral to BOTOX evaluation with dr Rexene Alberts or Krista Blue .  [name] Heart attack 09/2011  . 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     PAST SURGICAL HISTORY: Past Surgical History  Procedure Laterality Date  . Appendectomy    . Cholecystectomy    . Abdominal hysterectomy    . Breast lumpectomy Right 1996  . Colonoscopy w/ polypectomy    . Lumbar spur removed    .  Total knee arthroplasty Right   . Bunionectomy Right   . Ankle cystectomy Right   . Rectocele surgery    . 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 Hemorrhoid surgery  12/01/2010, 2014    [place] hemorrhoid ligation/pexy  . Cardiac catheterization  09/28/2011    Dr Acie Fredrickson(  to see Dr  Ron Parker)  . 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 Corneal transplant Left     x1  . Left heart catheterization with coronary angiogram N/A 09/28/2011    Procedure: LEFT HEART CATHETERIZATION WITH CORONARY ANGIOGRAM;  Surgeon: Minus Breeding, MD;  Location: [facility] C[facility] LAB;  Service: Cardiovascular;  Laterality: N/A;    FAMILY HISTORY: Family History  Problem Relation Age of Onset  . Breast cancer Mother   . Heart disease Father   . Stroke Father   . Dementia Father   . Prostate cancer Father   . Diabetes Sister     3 sisters   . Heart disease Sister     1 sister CABG  . Breast cancer Sister     PTE pre & post Dx of ca  . Diabetes Paternal Grandfather   . Colon cancer Neg Hx   . Breast cancer Sister     SOCIAL HISTORY: History   Social History  . Marital Status: Widowed    Spouse Name: Ruthann Cancer  . Number of Children: 3  . Years of Education: 14   Occupational History  . retired   .     Social History Main Topics  . Smoking status: Never Smoker   . Smokeless tobacco: Never Used  . Alcohol Use: Yes     Comment: rare  . Drug Use: No  . Sexual Activity: Not on file   Other Topics Concern  . Not on file   Social History Narrative   Patient is widowed,([place] past 10/02/13) and lives at home with her husband.   Patient has three adult children.   Patient has a college education.   Patient is right-handed.   Patient drinks two cups of coffee daily.   Regular exercise   Patient is retired.      PHYSICAL EXAM  Filed Vitals:   04/30/14 1315  BP: 137/76  Pulse: 70  Height: 5\' 5"  (1.651 m)  Weight: 218 lb (98.884 kg)   Body mass index is 36.28 kg/(m^2).  Generalized: Well developed, in no acute distress  Neck: Circumference 15 inches, Mallampati 4+   Neurological examination  Mentation: Alert oriented to time, place, history taking. Follows all commands speech and language fluent Cranial nerve II-XII: Pupils were equal round reactive to light.  Extraocular movements were full, visual field were full on confrontational test. Facial sensation and strength were normal. Uvula tongue midline. Head turning and shoulder shrug  were normal and symmetric. Motor: The motor testing reveals 5 over 5 strength of all 4 extremities. Good symmetric motor tone is noted throughout.  Sensory: Sensory testing is intact to soft touch on all 4 extremities. No evidence of extinction is noted.  Coordination: Cerebellar testing reveals good finger-nose-finger and heel-to-shin bilaterally.  Gait and station: Gait is normal. Tandem gait is normal. Romberg is negative. No drift is seen.  Reflexes: Deep tendon reflexes are symmetric and normal bilaterally.   DIAGNOSTIC DATA (LABS, IMAGING, TESTING)  - I reviewed patient records, labs, notes, testing and imaging myself where available.    ASSESSMENT AND PLAN 69 y.o. year old female  has a past medical history of CAD (  coronary artery disease); Ischemic cardiomyopathy; Corneal disorder; Postmenopausal status; Back pain; Dysmetabolic syndrome X; Arthritis; Carpal tunnel syndrome; GERD (gastroesophageal reflux disease); Diverticular disease; adenomatous colonic polyps; Hernia; Anxiety state; Hypertension; Shoulder fracture; HLD (hyperlipidemia); Migraine headache without aura (07/23/2012); Headache, paroxysmal hemicrania, chronic (07/23/2012); Heart attack (09/2011); and Glaucoma (increased eye pressure) (07/30/2013). here with:  1. Obstructive sleep apnea on CPAP 2. Migraine headaches  The patient's CPAP compliance download is excellent. The patient will continue using her CPAP nightly. The patient migraine headaches have improved with nortriptyline. The patient does have a history of myocardial infarct several years ago. The patient is scheduled to have a EKG next month with her cardiologist. Pending those results we may consider increasing the nortriptyline to 40 mg at bedtime to further improve her headache frequency.  She is amenable to this. She will follow-up in 6 months or sooner if needed.    Ward Givens, MSN, NP-C 04/30/2014, 1:16 PM [facility] [address], [place], [place] [address] [phone]  Note: This document was prepared with digital dictation and possible smart phrase technology. Any transcriptional errors that result from this process are unintentional.

## 2014-04-30 NOTE — Patient Instructions (Signed)
Continue Nortriptyline 30 mg daily at bedtime.  Please send EKG results and pending those we may increase nortriptyline. Continue using CPAP nightly.

## 2014-05-01 NOTE — Progress Notes (Signed)
I have reviewed and agreed above plan. 

## 2014-05-05 DIAGNOSIS — M7051 Other bursitis of knee, right knee: Secondary | ICD-10-CM | POA: Diagnosis not present

## 2014-05-15 DIAGNOSIS — M7051 Other bursitis of knee, right knee: Secondary | ICD-10-CM | POA: Diagnosis not present

## 2014-05-22 ENCOUNTER — Encounter: Payer: Self-pay | Admitting: Gastroenterology

## 2014-05-25 DIAGNOSIS — Z96651 Presence of right artificial knee joint: Secondary | ICD-10-CM | POA: Diagnosis not present

## 2014-05-25 DIAGNOSIS — M7051 Other bursitis of knee, right knee: Secondary | ICD-10-CM | POA: Diagnosis not present

## 2014-05-25 DIAGNOSIS — M5441 Lumbago with sciatica, right side: Secondary | ICD-10-CM | POA: Diagnosis not present

## 2014-05-25 DIAGNOSIS — Z471 Aftercare following joint replacement surgery: Secondary | ICD-10-CM | POA: Diagnosis not present

## 2014-05-26 DIAGNOSIS — H1851 Endothelial corneal dystrophy: Secondary | ICD-10-CM | POA: Diagnosis not present

## 2014-06-29 ENCOUNTER — Ambulatory Visit: Payer: Medicare Other | Admitting: Family Medicine

## 2014-06-30 DIAGNOSIS — H4011X1 Primary open-angle glaucoma, mild stage: Secondary | ICD-10-CM | POA: Diagnosis not present

## 2014-07-14 DIAGNOSIS — T819XXA Unspecified complication of procedure, initial encounter: Secondary | ICD-10-CM | POA: Diagnosis not present

## 2014-07-14 DIAGNOSIS — Z947 Corneal transplant status: Secondary | ICD-10-CM | POA: Diagnosis not present

## 2014-07-20 ENCOUNTER — Ambulatory Visit: Payer: Medicare Other | Admitting: Family Medicine

## 2014-07-23 ENCOUNTER — Ambulatory Visit (INDEPENDENT_AMBULATORY_CARE_PROVIDER_SITE_OTHER): Payer: Medicare Other | Admitting: Cardiology

## 2014-07-23 ENCOUNTER — Encounter: Payer: Self-pay | Admitting: Cardiology

## 2014-07-23 ENCOUNTER — Other Ambulatory Visit: Payer: Self-pay | Admitting: *Deleted

## 2014-07-23 VITALS — BP 104/74 | HR 66 | Ht 65.0 in | Wt 218.8 lb

## 2014-07-23 DIAGNOSIS — E7800 Pure hypercholesterolemia, unspecified: Secondary | ICD-10-CM

## 2014-07-23 DIAGNOSIS — R0789 Other chest pain: Secondary | ICD-10-CM | POA: Diagnosis not present

## 2014-07-23 DIAGNOSIS — R0602 Shortness of breath: Secondary | ICD-10-CM | POA: Diagnosis not present

## 2014-07-23 DIAGNOSIS — I2510 Atherosclerotic heart disease of native coronary artery without angina pectoris: Secondary | ICD-10-CM | POA: Diagnosis not present

## 2014-07-23 DIAGNOSIS — E785 Hyperlipidemia, unspecified: Secondary | ICD-10-CM

## 2014-07-23 DIAGNOSIS — Z79899 Other long term (current) drug therapy: Secondary | ICD-10-CM

## 2014-07-23 DIAGNOSIS — I214 Non-ST elevation (NSTEMI) myocardial infarction: Secondary | ICD-10-CM

## 2014-07-23 MED ORDER — CLOPIDOGREL BISULFATE 75 MG PO TABS: 75.0000 mg | ORAL_TABLET | Freq: Every day | ORAL | Status: DC

## 2014-07-23 MED ORDER — CARVEDILOL 6.25 MG PO TABS: 6.2500 mg | ORAL_TABLET | Freq: Two times a day (BID) | ORAL | Status: DC

## 2014-07-23 MED ORDER — CARVEDILOL 3.125 MG PO TABS
3.1250 mg | ORAL_TABLET | Freq: Two times a day (BID) | ORAL | Status: DC
Start: 2014-07-23 — End: 2014-12-31

## 2014-07-23 MED ORDER — ROSUVASTATIN CALCIUM 20 MG PO TABS: 20.0000 mg | ORAL_TABLET | Freq: Every day | ORAL | Status: DC

## 2014-07-23 MED ORDER — LOSARTAN POTASSIUM 50 MG PO TABS: 50.0000 mg | ORAL_TABLET | Freq: Every day | ORAL | Status: DC

## 2014-07-23 NOTE — Patient Instructions (Signed)
Your physician recommends that you schedule a follow-up appointment in: one year with Dr. Percival Spanish  Stop taking your pravastatin  Start taking crestor 20 mg daily  Have your lipids and liver labs checked in 10 weeks  Have your BNP checked on your way out today

## 2014-07-23 NOTE — Progress Notes (Signed)
HPI The patient presents for followup of her cardiomyopathy and coronary disease that we are managing medically. She had a non-Q-wave myocardial infarction in the past.  At that time her ejection fraction by echo was 30-35% though it was higher by cath. Follow up echo demonstrated her EF to be 60-65% in 2013.  She presents for follow-up. She's been doing relatively well. She's not particularly active though she does her chores of daily living. She will get short of breath with this and says that it has been slowly progressive. However, she's had no rapid change in her symptoms. She's not describing any new PND or orthopnea. She's not having any palpitations, presyncope or syncope. She has no weight gain or edema.  Allergies  Allergen Reactions  . Iohexol     rash  . Iodine   . Tape     Plastic tape  . Imdur [Isosorbide]     Severe headaches  . Latex Rash  . Verapamil     REACTION: dizziness    Current Outpatient Prescriptions  Medication Sig Dispense Refill  . aspirin 81 MG tablet Take 81 mg by mouth daily.    . Carboxymethylcellulose Sodium (LUBRICANT EYE DROPS OP) Apply 1 drop to eye daily as needed. For dry or itchy eyes.     . carvedilol (COREG) 3.125 MG tablet Take 1 tablet (3.125 mg total) by mouth 2 (two) times daily. 180 tablet 3  . carvedilol (COREG) 6.25 MG tablet Take 1 tablet (6.25 mg total) by mouth 2 (two) times daily. 180 tablet 3  . cholecalciferol (VITAMIN D) 1000 UNITS tablet Take 2,000 Units by mouth daily.    . clobetasol ointment (TEMOVATE) 4.09 % Apply 1 application topically 2 (two) times daily. 30 g 5  . clopidogrel (PLAVIX) 75 MG tablet Take 1 tablet (75 mg total) by mouth daily with breakfast. 90 tablet 3  . estradiol (ESTRACE) 1 MG tablet Take 5 mg by mouth daily.     [name] gabapentin (NEURONTIN) 100 MG capsule 2 po tid for pain 540 capsule 1  . loperamide (IMODIUM) 2 MG capsule Take 2 mg by mouth 4 (four) times daily as needed. For loose stool    . losartan  (COZAAR) 50 MG tablet Take 1 tablet (50 mg total) by mouth daily. 1 by mouth once daily 90 tablet 3  . nitroGLYCERIN (NITROSTAT) 0.4 MG SL tablet Place 1 tablet (0.4 mg total) under the tongue every 5 (five) minutes x 3 doses as needed for chest pain. 25 tablet prn  . nortriptyline (PAMELOR) 10 MG capsule Take 3 capsules (30 mg total) by mouth at bedtime. 270 capsule 3  . pravastatin (PRAVACHOL) 80 MG tablet Take 1 tablet (80 mg total) by mouth every evening. 90 tablet 3  . psyllium (METAMUCIL) 58.6 % powder Take 1 packet by mouth as needed. For regularity    . zolpidem (AMBIEN) 10 MG tablet Take 1 tablet (10 mg total) by mouth at bedtime. 90 tablet 1   Current Facility-Administered Medications  Medication Dose Route Frequency Provider Last Rate Last Dose  . botulinum toxin Type A (BOTOX) injection 150 Units  150 Units Intramuscular Once Marcial Pacas, MD        Past Medical History  Diagnosis Date  . CAD (coronary artery disease)     NSTEMI 8/13 => LHC 09/27/11: oLM 50%, dLM 30%, oLAD 60-70%, pLAD 50%, mLAD 70%, pD1 30%, pD2 40%, oRCA 25%, mRCA 40%, EF 50% with ant HK => LAD not amenable  to PCI => med Rx  unless recurrent angina = > consider CABG  . Ischemic cardiomyopathy     a.  Echo 09/29/11: EF 30-35% with inferior, posterior, lateral AK, anterior severe HK, mild LVH, mild MR, PASP 40;  => b. follow up echo 10/13:  EF 55-60%, Gr 1 diast dysfn, mild MR  . Corneal disorder   . Postmenopausal status   . Back pain   . Dysmetabolic syndrome X   . Arthritis     osteo...right knee  . Carpal tunnel syndrome   . GERD (gastroesophageal reflux disease)   . Diverticular disease   . Hx of adenomatous colonic polyps   . Hernia     hiatal  . Anxiety state     NOS  . Hypertension     essen. NOS  . Shoulder fracture   . HLD (hyperlipidemia)   . Migraine headache without aura 07/23/2012     Left sided  Lamonte Sakai thought to be migrainous  , hypersensitivity to touch , photophobia ,  no nausea.   .  Headache, paroxysmal hemicrania, chronic 07/23/2012     Referral to BOTOX evaluation with dr Rexene Alberts or Krista Blue .  [name] Heart attack 09/2011  . 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     Past Surgical History  Procedure Laterality Date  . Appendectomy    . Cholecystectomy    . Abdominal hysterectomy    . Breast lumpectomy Right 1996  . Colonoscopy w/ polypectomy    . Lumbar spur removed    . Total knee arthroplasty Right   . Bunionectomy Right   . Ankle cystectomy Right   . Rectocele surgery    . 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 Hemorrhoid surgery  12/01/2010, 2014    [place] hemorrhoid ligation/pexy  . Cardiac catheterization  09/28/2011    Dr Acie Fredrickson(  to see Dr Ron Parker)  . 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 Corneal transplant Left     x1  . Left heart catheterization with coronary angiogram N/A 09/28/2011    Procedure: LEFT HEART CATHETERIZATION WITH CORONARY ANGIOGRAM;  Surgeon: Minus Breeding, MD;  Location[facility] C[facility] LAB;  Service: Cardiovascular;  Laterality: N/A;    ROS:  As stated in the HPI and negative for all other systems.  PHYSICAL EXAM BP 104/74 mmHg  Pulse 66  Ht 5\' 5"  (1.651 m)  Wt 218 lb 12.8 oz (99.247 kg)  BMI 36.41 kg/m2 GENERAL:  Well appearing HEENT:  Pupils equal round and reactive, fundi not visualized, oral mucosa unremarkable NECK:  No jugular venous distention, waveform within normal limits, carotid upstroke brisk and symmetric, no bruits, no thyromegaly LUNGS:  Clear to auscultation bilaterally CHEST:  Unremarkable HEART:  PMI not displaced or sustained,S1 and S2 within normal limits, no S3, no S4, no clicks, no rubs, no murmurs ABD:  Flat, positive bowel sounds normal in frequency in pitch, no bruits, no rebound, no guarding, no midline pulsatile mass, no hepatomegaly, no splenomegaly EXT:  2 plus pulses throughout, no edema, no cyanosis no clubbing  EKG:  Sinus rhythm, rate  66, axis within normal limits, intervals within normal limits, no acute ST-T wave changes.07/23/2014   ASSESSMENT AND PLAN  Coronary Artery Disease:  The patient has no new sypmtoms.  No further cardiovascular testing is indicated.  We will continue with aggressive risk reduction and meds as listed.  Ischemic Cardiomyopathy:  She is  slightly short of breath. I will check a BNP level. This is normal no further imaging is indicated.  Hyperlipidemia:  I reviewed her last lipids. Her LDL was still markedly elevated. She couldn't take Lipitor. I did convince her to try Crestor 20 mg daily. If she still on this and 10 weeks she will get a lipid profile liver enzymes. If she doesn't tolerate this I would send her for consideration of PCSK9 inhibitor.   Hypertension: Her blood pressure is controlled. I will however be changing meds as listed.

## 2014-07-24 LAB — BRAIN NATRIURETIC PEPTIDE: Brain Natriuretic Peptide: 41.9 pg/mL (ref 0.0–100.0)

## 2014-08-03 ENCOUNTER — Telehealth: Payer: Self-pay | Admitting: Adult Health

## 2014-08-03 NOTE — Telephone Encounter (Signed)
Noted  

## 2014-08-03 NOTE — Telephone Encounter (Addendum)
Called patient back and spoke to her. Patient just wanted to tell Megan her Cardiologist will send Megan's Notes she saw him last week. EKG was normal . I stated to patient Dr. Percival Spanish   Is on Epic . I asked patient if she wanted to relay anything else to [facility] she stated not at this time. If Dr. Jenkins Rouge calls he may want to up up my Nortriptyline if he does I will need a new RX.

## 2014-08-03 NOTE — Telephone Encounter (Signed)
Patient is calling to speak with Jinny Blossom in regard to her recent EKG and the possibility of her nortriptyline being increased.  Please call.

## 2014-08-19 ENCOUNTER — Telehealth: Payer: Self-pay | Admitting: Adult Health

## 2014-08-19 NOTE — Telephone Encounter (Signed)
Called patient and left her message stating I will forward to Jinny Blossom will return her with 24-48 hours.

## 2014-08-19 NOTE — Telephone Encounter (Signed)
Pt called stating that her EKG was normal and is wondering if Nortriptyline needs to be increase, if so she will need a new Rx. She also would like zolpidem (AMBIEN) 10 MG tablet sent to the new pharmacy. They require 90 day supply.   Please call and advise [phone]. Would like to have confirmation that the Rx's have been sent in.  New Pharmacy information- meds by mail [place], [place], [place]

## 2014-08-20 ENCOUNTER — Other Ambulatory Visit: Payer: Self-pay

## 2014-08-20 ENCOUNTER — Other Ambulatory Visit: Payer: Self-pay | Admitting: *Deleted

## 2014-08-20 DIAGNOSIS — M5441 Lumbago with sciatica, right side: Secondary | ICD-10-CM | POA: Diagnosis not present

## 2014-08-20 DIAGNOSIS — Z96651 Presence of right artificial knee joint: Secondary | ICD-10-CM | POA: Diagnosis not present

## 2014-08-20 DIAGNOSIS — Z471 Aftercare following joint replacement surgery: Secondary | ICD-10-CM | POA: Diagnosis not present

## 2014-08-20 DIAGNOSIS — M25562 Pain in left knee: Secondary | ICD-10-CM | POA: Diagnosis not present

## 2014-08-20 MED ORDER — ROSUVASTATIN CALCIUM 20 MG PO TABS: 20.0000 mg | ORAL_TABLET | Freq: Every day | ORAL | Status: DC

## 2014-08-20 MED ORDER — NORTRIPTYLINE HCL 10 MG PO CAPS: 40.0000 mg | ORAL_CAPSULE | Freq: Every day | ORAL | Status: DC

## 2014-08-20 NOTE — Telephone Encounter (Signed)
She can increase nortriptyline to 40 mg at bedtime. I typically do not write 90 day prescriptions for Ambien.

## 2014-08-20 NOTE — Telephone Encounter (Signed)
Nortriptyline has been updated.  New pharmacy added to chart.  Rx sent.  I called the patient back.  Got no answer.  Left message relaying providers note.

## 2014-08-28 ENCOUNTER — Other Ambulatory Visit: Payer: Self-pay | Admitting: Neurology

## 2014-08-28 NOTE — Telephone Encounter (Signed)
The Rx for

## 2014-08-28 NOTE — Telephone Encounter (Signed)
Patietn states she needs a new Rx for

## 2014-08-28 NOTE — Telephone Encounter (Signed)
Request entered, forwarded to provider for approval.  

## 2014-08-28 NOTE — Telephone Encounter (Signed)
Patient is calling to get a new Rx for zolpidem (AMBIEN) 10 MG tablet faxed to [facility] at [phone]. Please put patient's name and DOB on the Rx. [facility] states they cannot take an electronic order because this is a controlled substance. Per patient's request please call patient and advise when this is completed.. Thank you.

## 2014-08-31 MED ORDER — ZOLPIDEM TARTRATE 10 MG PO TABS: 10.0000 mg | ORAL_TABLET | Freq: Every day | ORAL | Status: DC

## 2014-09-11 DIAGNOSIS — Z947 Corneal transplant status: Secondary | ICD-10-CM | POA: Diagnosis not present

## 2014-10-01 ENCOUNTER — Telehealth: Payer: Self-pay | Admitting: Gastroenterology

## 2014-10-01 NOTE — Telephone Encounter (Signed)
Patient has a follow up scheduled with you on 11/22/14. She has issues with frequent bowel movements and bloating. She has seen the [facility]. She is asking if she can take this prior to her appointment. Please advise.

## 2014-10-01 NOTE — Telephone Encounter (Signed)
Last OV 02/2013. Left message to call back.

## 2014-10-02 ENCOUNTER — Other Ambulatory Visit: Payer: Self-pay

## 2014-10-02 MED ORDER — DIPHENOXYLATE-ATROPINE 2.5-0.025 MG PO TABS: ORAL_TABLET | ORAL | Status: DC

## 2014-10-02 NOTE — Telephone Encounter (Signed)
Patient instructed and she is agreeable to this plan.

## 2014-10-02 NOTE — Telephone Encounter (Signed)
She needs an OV before we try xifaxan.  OK to begin lomotil 1-2 tabs qam and q6h prn.

## 2014-10-08 ENCOUNTER — Encounter: Payer: Self-pay | Admitting: Gastroenterology

## 2014-10-14 ENCOUNTER — Telehealth: Payer: Self-pay | Admitting: Gastroenterology

## 2014-10-14 NOTE — Telephone Encounter (Signed)
yes

## 2014-10-14 NOTE — Telephone Encounter (Signed)
The lomotil has helped tremendously with the diarrhea and cramping pain. She is having to take 4 a day to accomplish this. She takes 2 in the morning. She will have a normal bowel movement. Then she has a bowel movement each time she eats, but no pain. By the evening she is having loose bowel movements and painful cramping, so she takes 2 more Lomotil. I have moved her appointment up to a work-in spot on 10/28/14. She will need at least 1 refill on the lomotil. Will this be okay with you?

## 2014-10-14 NOTE — Telephone Encounter (Signed)
FYI-see below- 

## 2014-10-14 NOTE — Telephone Encounter (Signed)
ok 

## 2014-10-20 ENCOUNTER — Other Ambulatory Visit: Payer: Self-pay | Admitting: Gastroenterology

## 2014-10-22 ENCOUNTER — Telehealth: Payer: Self-pay | Admitting: Gastroenterology

## 2014-10-22 MED ORDER — DIPHENOXYLATE-ATROPINE 2.5-0.025 MG PO TABS: ORAL_TABLET | ORAL | Status: DC

## 2014-10-22 NOTE — Telephone Encounter (Signed)
Rx sent 

## 2014-10-28 ENCOUNTER — Ambulatory Visit (INDEPENDENT_AMBULATORY_CARE_PROVIDER_SITE_OTHER): Payer: Medicare Other | Admitting: Gastroenterology

## 2014-10-28 ENCOUNTER — Telehealth: Payer: Self-pay | Admitting: *Deleted

## 2014-10-28 ENCOUNTER — Encounter: Payer: Self-pay | Admitting: Gastroenterology

## 2014-10-28 VITALS — BP 132/96 | HR 72 | Ht 63.5 in | Wt 212.2 lb

## 2014-10-28 DIAGNOSIS — Z86010 Personal history of colonic polyps: Secondary | ICD-10-CM | POA: Diagnosis not present

## 2014-10-28 DIAGNOSIS — K589 Irritable bowel syndrome without diarrhea: Secondary | ICD-10-CM

## 2014-10-28 DIAGNOSIS — I2510 Atherosclerotic heart disease of native coronary artery without angina pectoris: Secondary | ICD-10-CM

## 2014-10-28 MED ORDER — DIPHENOXYLATE-ATROPINE 2.5-0.025 MG PO TABS: ORAL_TABLET | ORAL | Status: DC

## 2014-10-28 MED ORDER — NA SULFATE-K SULFATE-MG SULF 17.5-3.13-1.6 GM/177ML PO SOLN: 1.0000 | Freq: Once | ORAL | Status: DC

## 2014-10-28 NOTE — Telephone Encounter (Signed)
  2020/12/2214   RE: Connie Owens DOB: 06-Oct-1945 MRN: [id number]   Dear Dr. Percival Spanish,     We have scheduled the above patient for an endoscopic procedure. Our records show that she is on anticoagulation therapy.   Please advise as to how long the patient may come off her therapy of Plavix prior to the procedure, which is scheduled for 12-09-14.  Please fax back/ or route the completed form to Genella Mech at [phone].   Sincerely,    Genella Mech

## 2014-10-28 NOTE — Patient Instructions (Addendum)
You have been scheduled for a colonoscopy. Please follow written instructions given to you at your visit today.  Please pick up your prep supplies at the pharmacy within the next 1-3 days. If you use inhalers (even only as needed), please bring them with you on the day of your procedure. Your physician has requested that you go to www.startemmi.com and enter the access code given to you at your visit today. This web site gives a general overview about your procedure. However, you should still follow specific instructions given to you by our office regarding your preparation for the procedure.  We have given you a written prescription for Lomotil   You will be contacted by our office prior to your procedure for directions on holding your Plavix.  If you do not hear from our office 1 week prior to your scheduled procedure, please call [phone] to discuss.   Per your request we have sent the Suprep to your mail order pharmacy.

## 2014-10-28 NOTE — Assessment & Plan Note (Signed)
Plan follow-up colonoscopy.  Plavix will be held if approved by her PCP.  The risk of holding anticoagulation therapy or antiplatelet medications was discussed including the increased risk for thromboembolic disease that may include DVT, pulmonary emboli and stroke. The patient understands this risk and is willing to proceed with temporally holding the medication provided that this is approved by her PCP or cardiologist.

## 2014-10-28 NOTE — Progress Notes (Signed)
      History of Present Illness:  Ms. Dault as returned for follow-up of diarrhea.  She has IBS that actually is well controlled with Lomotil.  He takes about 3 a day and notes significant decrease in cramping, frequency and urgency.  In 2011 an adenomatous polyp was removed.  She is on Plavix.    Review of Systems: Pertinent positive and negative review of systems were noted in the above HPI section. All other review of systems were otherwise negative.    Current Medications, Allergies, Past Medical History, Past Surgical History, Family History and Social History were reviewed in [facility] record  Vital signs were reviewed in today's medical record. Physical Exam: General: Well developed , well nourished, no acute distress Skin: anicteric  See Assessment and Plan under Problem List

## 2014-10-28 NOTE — Assessment & Plan Note (Signed)
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  Plan to continue with the same.

## 2014-10-30 NOTE — Telephone Encounter (Signed)
She can stop Plavix five days before the procedure as needed.

## 2014-10-30 NOTE — Telephone Encounter (Signed)
L/M for pt that it is ok to hold plavix 5 days before procedure and for her to call back and let me know she got my message

## 2014-11-05 ENCOUNTER — Encounter: Payer: Self-pay | Admitting: Adult Health

## 2014-11-05 ENCOUNTER — Ambulatory Visit (INDEPENDENT_AMBULATORY_CARE_PROVIDER_SITE_OTHER): Payer: Medicare Other | Admitting: Adult Health

## 2014-11-05 VITALS — BP 167/82 | HR 60 | Ht 63.5 in | Wt 210.0 lb

## 2014-11-05 DIAGNOSIS — G4733 Obstructive sleep apnea (adult) (pediatric): Secondary | ICD-10-CM | POA: Diagnosis not present

## 2014-11-05 DIAGNOSIS — G43009 Migraine without aura, not intractable, without status migrainosus: Secondary | ICD-10-CM

## 2014-11-05 DIAGNOSIS — Z9989 Dependence on other enabling machines and devices: Secondary | ICD-10-CM

## 2014-11-05 DIAGNOSIS — I2510 Atherosclerotic heart disease of native coronary artery without angina pectoris: Secondary | ICD-10-CM | POA: Diagnosis not present

## 2014-11-05 NOTE — Progress Notes (Signed)
I reviewed above note and agree with the assessment and plan.  Rosalin Hawking, MD PhD Stroke Neurology 11/05/2014 6:30 PM

## 2014-11-05 NOTE — Progress Notes (Signed)
PATIENT: Connie Owens DOB: Aug 14, 1945  REASON FOR VISIT: follow up- OSA on CPAP, migraines HISTORY FROM: patient  HISTORY OF PRESENT ILLNESS: Ms. Connie Owens is a 69 year old female with history of OSA on CPAP and migraines. She returns today for follow-up. She states that her headaches have improved on nortriptyline. She states that she has approximately one headache a week. These headaches, last 30-40 minutes. They normally occur on the left side of the head. Denies photophobia or phonophobia. Denies nausea or vomiting. She states that if she relaxes and tries to rest her headache resolves quickly. She states that she uses the CPAP nightly. 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 She returns today for an evaluation.  HISTORY 04/30/14: Ms. Connie Owens is a 69 year old female with history of obstructive sleep Apnea and migraines. She returns today for a 90 day compliance download. She brought her machine with her today and the reports shows an AHI of 0.3at 6 cm of water, uses her machine for 5 hours and 57 minutes a night, with 100 % compliance. She uses her machine for greater than 4 hours 74 out of 90 days with compliance of 82%. Her Epworth score is 7 points was previously 4 points. Her fatigue severity score is 41 was previously 53.. Patient reports that she gets about 9 hours of sleep a night. She goes to bed around 11PM and arises at 8:15AM. She denies having trouble falling asleep or staying a sleep. States that the she gets up about 1-2 times a night to urinate. Overall patient feels that CPAP has improved her sleepiness and fatigue. The patient states that her migraines have improved. She continues to take nortriptyline 30 mg at bedtime. She has approximately 1-2 headaches per week. She will occassionally have sharp pain that is on the left side of the head that last for seconds and then will resolve. Overall she feels that her  headaches have improved. Patient continues to suffer from anxiety and depression due to the loss of her husband. They were married 79 years.   REVIEW OF SYSTEMS: Out of a complete 14 system review of symptoms, the patient complains only of the following symptoms, and all other reviewed systems are negative.  ALLERGIES: Allergies  Allergen Reactions  . Iohexol     rash  . Iodine   . Tape     Plastic tape  . Imdur [Isosorbide]     Severe headaches  . Latex Rash  . Verapamil     REACTION: dizziness    HOME MEDICATIONS: Outpatient Prescriptions Prior to Visit  Medication Sig Dispense Refill  . aspirin 81 MG tablet Take 81 mg by mouth daily.    . carvedilol (COREG) 3.125 MG tablet Take 1 tablet (3.125 mg total) by mouth 2 (two) times daily. 180 tablet 3  . carvedilol (COREG) 6.25 MG tablet Take 1 tablet (6.25 mg total) by mouth 2 (two) times daily. 180 tablet 3  . cholecalciferol (VITAMIN D) 1000 UNITS tablet Take 2,000 Units by mouth daily.    . clopidogrel (PLAVIX) 75 MG tablet Take 1 tablet (75 mg total) by mouth daily with breakfast. 90 tablet 3  . diphenoxylate-atropine (LOMOTIL) 2.5-0.025 MG per tablet 1 to 2 tablets in the morning, then every 6 hours as needed for diarrhea. 720 tablet 3  . estradiol (ESTRACE) 1 MG tablet Take 5 mg by mouth daily.     [name]  gabapentin (NEURONTIN) 100 MG capsule 2 po tid for pain (Patient taking differently: 3 po tid for pain) 540 capsule 1  . losartan (COZAAR) 50 MG tablet Take 1 tablet (50 mg total) by mouth daily. 1 by mouth once daily 90 tablet 3  . nortriptyline (PAMELOR) 10 MG capsule Take 4 capsules (40 mg total) by mouth at bedtime. 360 capsule 3  . prednisoLONE acetate (PRED FORTE) 1 % ophthalmic suspension 1 drop twice a day in the right eye 1 drop daily in the left eye    . rosuvastatin (CRESTOR) 20 MG tablet Take 1 tablet (20 mg total) by mouth daily. 90 tablet 3  . zolpidem (AMBIEN) 10 MG tablet Take 1 tablet (10 mg total) by mouth at  bedtime. 90 tablet 1  . Na Sulfate-K Sulfate-Mg Sulf (SUPREP BOWEL PREP) SOLN Take 1 kit by mouth once. (Patient not taking: Reported on 11/05/2014) 1 Bottle 0  . nitroGLYCERIN (NITROSTAT) 0.4 MG SL tablet Place 1 tablet (0.4 mg total) under the tongue every 5 (five) minutes x 3 doses as needed for chest pain. (Patient not taking: Reported on 29-Jun-202016) 25 tablet prn  . Carboxymethylcellulose Sodium (LUBRICANT EYE DROPS OP) Apply 1 drop to eye daily as needed. For dry or itchy eyes.     [name] psyllium (METAMUCIL) 58.6 % powder Take 1 packet by mouth as needed. For regularity     Facility-Administered Medications Prior to Visit  Medication Dose Route Frequency Provider Last Rate Last Dose  . botulinum toxin Type A (BOTOX) injection 150 Units  150 Units Intramuscular Once Marcial Pacas, MD        PAST MEDICAL HISTORY: Past Medical History  Diagnosis Date  . CAD (coronary artery disease)     NSTEMI 8/13 => LHC 09/27/11: oLM 50%, dLM 30%, oLAD 60-70%, pLAD 50%, mLAD 70%, pD1 30%, pD2 40%, oRCA 25%, mRCA 40%, EF 50% with ant HK => LAD not amenable to PCI => med Rx  unless recurrent angina = > consider CABG  . Ischemic cardiomyopathy     a.  Echo 09/29/11: EF 30-35% with inferior, posterior, lateral AK, anterior severe HK, mild LVH, mild MR, PASP 40;  => b. follow up echo 10/13:  EF 55-60%, Gr 1 diast dysfn, mild MR  . Corneal disorder   . Postmenopausal status   . Back pain   . Dysmetabolic syndrome X   . Arthritis     osteo...right knee  . Carpal tunnel syndrome   . GERD (gastroesophageal reflux disease)   . Diverticular disease   . Hx of adenomatous colonic polyps   . Hernia     hiatal  . Anxiety state     NOS  . Hypertension     essen. NOS  . Shoulder fracture   . HLD (hyperlipidemia)   . Migraine headache without aura 07/23/2012     Left sided  Lamonte Sakai thought to be migrainous  , hypersensitivity to touch , photophobia ,  no nausea.   . Headache, paroxysmal hemicrania, chronic 07/23/2012      Referral to BOTOX evaluation with dr Rexene Alberts or Krista Blue .  [name] Heart attack 09/2011  . 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     FAMILY HISTORY: Family History  Problem Relation Age of Onset  . Breast cancer Mother   . Heart disease Father   . Stroke Father   . Dementia Father   . Prostate cancer Father   .  Diabetes Sister     3 sisters   . Heart disease Sister     1 sister CABG  . Breast cancer Sister     PTE pre & post Dx of ca  . Diabetes Paternal Grandfather   . Colon cancer Neg Hx   . Breast cancer Sister     SOCIAL HISTORY: Social History   Social History  . Marital Status: Widowed    Spouse Name: Ruthann Cancer  . Number of Children: 3  . Years of Education: 14   Occupational History  . retired   .     Social History Main Topics  . Smoking status: Never Smoker   . Smokeless tobacco: Never Used  . Alcohol Use: Yes     Comment: rare  . Drug Use: No  . Sexual Activity: Not on file   Other Topics Concern  . Not on file   Social History Narrative   Patient is widowed,([place] past 10/02/13) and lives at home with her husband.   Patient has three adult children.   Patient has a college education.   Patient is right-handed.   Patient drinks two cups of coffee daily.   Regular exercise   Patient is retired.      PHYSICAL EXAM  Filed Vitals:   11/05/14 1314  BP: 167/82  Pulse: 60  Height: 5' 3.5" (1.613 m)  Weight: 210 lb (95.255 kg)   Body mass index is 36.61 kg/(m^2).  Generalized: Well developed, in no acute distress   Neurological examination  Mentation: Alert oriented to time, place, history taking. Follows all commands speech and language fluent Cranial nerve II-XII: Pupils were equal round reactive to light. Extraocular movements were full, visual field were full on confrontational test. Facial sensation and strength were normal. Uvula tongue midline. Head turning and shoulder  shrug  were normal and symmetric. Motor: The motor testing reveals 5 over 5 strength of all 4 extremities. Good symmetric motor tone is noted throughout.  Sensory: Sensory testing is intact to soft touch on all 4 extremities. No evidence of extinction is noted.  Coordination: Cerebellar testing reveals good finger-nose-finger and heel-to-shin bilaterally.  Gait and station: Gait is normal.  Reflexes: Deep tendon reflexes are symmetric and normal bilaterally.   DIAGNOSTIC DATA (LABS, IMAGING, TESTING) - I reviewed patient records, labs, notes, testing and imaging myself where available.     ASSESSMENT AND PLAN 69 y.o. year old female  has a past medical history of CAD (coronary artery disease); Ischemic cardiomyopathy; Corneal disorder; Postmenopausal status; Back pain; Dysmetabolic syndrome X; Arthritis; Carpal tunnel syndrome; GERD (gastroesophageal reflux disease); Diverticular disease; adenomatous colonic polyps; Hernia; Anxiety state; Hypertension; Shoulder fracture; HLD (hyperlipidemia); Migraine headache without aura (07/23/2012); Headache, paroxysmal hemicrania, chronic (07/23/2012); Heart attack (09/2011); and Glaucoma (increased eye pressure) (07/30/2013). here with:  1. Migraine headache 2. OSA on cpap  Overall the patient is doing well. She will continue on nortriptyline 40 mg at bedtime. Patient had a recent EKG that was normal. She will continue on Ambien nightly. She was advised to continue using the CPAP nightly. We will do a compliance download at the next visit. She will follow-up in 6 months with Dr. Brett Fairy or sooner if needed.  Ward Givens, MSN, NP-C 11/05/2014, 1:20 PM [facility] [address] [place], [place] [address] [phone]

## 2014-11-05 NOTE — Patient Instructions (Signed)
Continue Nortriptyline Continue using CPAP nightly. If your symptoms worsen or you develop new symptoms please let us know.

## 2014-11-13 ENCOUNTER — Telehealth: Payer: Self-pay | Admitting: Gastroenterology

## 2014-11-13 MED ORDER — NA SULFATE-K SULFATE-MG SULF 17.5-3.13-1.6 GM/177ML PO SOLN: 1.0000 | Freq: Once | ORAL | Status: DC

## 2014-11-13 NOTE — Telephone Encounter (Signed)
Resent Suprep to [facility] tried to call pt to inform but no answer

## 2014-11-17 ENCOUNTER — Encounter: Payer: Self-pay | Admitting: Family Medicine

## 2014-11-17 ENCOUNTER — Ambulatory Visit (INDEPENDENT_AMBULATORY_CARE_PROVIDER_SITE_OTHER): Payer: Medicare Other | Admitting: Family Medicine

## 2014-11-17 VITALS — BP 144/82 | HR 85 | Temp 97.8°F | Ht 64.0 in | Wt 212.2 lb

## 2014-11-17 DIAGNOSIS — Z833 Family history of diabetes mellitus: Secondary | ICD-10-CM | POA: Diagnosis not present

## 2014-11-17 DIAGNOSIS — R682 Dry mouth, unspecified: Secondary | ICD-10-CM

## 2014-11-17 DIAGNOSIS — Z23 Encounter for immunization: Secondary | ICD-10-CM | POA: Diagnosis not present

## 2014-11-17 DIAGNOSIS — I2510 Atherosclerotic heart disease of native coronary artery without angina pectoris: Secondary | ICD-10-CM

## 2014-11-17 DIAGNOSIS — E785 Hyperlipidemia, unspecified: Secondary | ICD-10-CM

## 2014-11-17 DIAGNOSIS — M792 Neuralgia and neuritis, unspecified: Secondary | ICD-10-CM

## 2014-11-17 DIAGNOSIS — I10 Essential (primary) hypertension: Secondary | ICD-10-CM

## 2014-11-17 DIAGNOSIS — L409 Psoriasis, unspecified: Secondary | ICD-10-CM

## 2014-11-17 DIAGNOSIS — R631 Polydipsia: Secondary | ICD-10-CM

## 2014-11-17 LAB — POCT URINALYSIS DIPSTICK
Bilirubin, UA: NEGATIVE
Blood, UA: NEGATIVE
Glucose, UA: NEGATIVE
KETONES UA: NEGATIVE
LEUKOCYTES UA: NEGATIVE
NITRITE UA: NEGATIVE
PROTEIN UA: NEGATIVE
Spec Grav, UA: 1.025
UROBILINOGEN UA: NEGATIVE
pH, UA: 6

## 2014-11-17 LAB — COMPLETE METABOLIC PANEL WITH GFR
ALBUMIN: 3.9 g/dL (ref 3.6–5.1)
ALT: 12 U/L (ref 6–29)
AST: 14 U/L (ref 10–35)
Alkaline Phosphatase: 54 U/L (ref 33–130)
BILIRUBIN TOTAL: 0.4 mg/dL (ref 0.2–1.2)
BUN: 14 mg/dL (ref 7–25)
CALCIUM: 9.7 mg/dL (ref 8.6–10.4)
CO2: 28 mmol/L (ref 20–31)
CREATININE: 0.87 mg/dL (ref 0.50–0.99)
Chloride: 100 mmol/L (ref 98–110)
GFR, Est African American: 79 mL/min (ref >=60)
GFR, Est Non African American: 68 mL/min (ref >=60)
GLUCOSE: 116 mg/dL — AB (ref 65–99)
POTASSIUM: 4.3 mmol/L (ref 3.5–5.3)
SODIUM: 135 mmol/L (ref 135–146)
TOTAL PROTEIN: 6.6 g/dL (ref 6.1–8.1)

## 2014-11-17 LAB — LIPID PANEL
CHOLESTEROL: 185 mg/dL (ref 0–200)
HDL: 49.4 mg/dL (ref >39.00)
LDL Cholesterol: 101 mg/dL — ABNORMAL HIGH (ref 0–99)
NonHDL: 135.19
Total CHOL/HDL Ratio: 4
Triglycerides: 173 mg/dL — ABNORMAL HIGH (ref 0.0–149.0)
VLDL: 34.6 mg/dL (ref 0.0–40.0)

## 2014-11-17 LAB — MICROALBUMIN / CREATININE URINE RATIO
Creatinine,U: 125.6 mg/dL
MICROALB/CREAT RATIO: 0.6 mg/g (ref 0.0–30.0)

## 2014-11-17 MED ORDER — GABAPENTIN 100 MG PO CAPS: ORAL_CAPSULE | ORAL | Status: DC

## 2014-11-17 MED ORDER — CLOBETASOL PROPIONATE 0.05 % EX OINT: 1.0000 "application " | TOPICAL_OINTMENT | Freq: Two times a day (BID) | CUTANEOUS | Status: DC

## 2014-11-17 NOTE — Assessment & Plan Note (Signed)
con't coreg and cozaar Stable

## 2014-11-17 NOTE — Patient Instructions (Signed)

## 2014-11-17 NOTE — Progress Notes (Signed)
Pre visit review using our clinic review tool, if applicable. No additional management support is needed unless otherwise documented below in the visit note. 

## 2014-11-17 NOTE — Progress Notes (Signed)
Patient ID: Connie Owens, female    DOB: 08-13-1945  Age: 69 y.o. MRN: [id number]    Subjective:  Subjective HPI Connie Owens presents for f/u cholesterol   Review of Systems  Constitutional: Negative for diaphoresis, appetite change, fatigue and unexpected weight change.  Eyes: Negative for pain, redness and visual disturbance.  Respiratory: Negative for cough, chest tightness, shortness of breath and wheezing.   Cardiovascular: Negative for chest pain, palpitations and leg swelling.  Endocrine: Negative for cold intolerance, heat intolerance, polydipsia, polyphagia and polyuria.  Genitourinary: Negative for dysuria, frequency and difficulty urinating.  Neurological: Negative for dizziness, light-headedness, numbness and headaches.    History Past Medical History  Diagnosis Date  . CAD (coronary artery disease)     NSTEMI 8/13 => LHC 09/27/11: oLM 50%, dLM 30%, oLAD 60-70%, pLAD 50%, mLAD 70%, pD1 30%, pD2 40%, oRCA 25%, mRCA 40%, EF 50% with ant HK => LAD not amenable to PCI => med Rx  unless recurrent angina = > consider CABG  . Ischemic cardiomyopathy     a.  Echo 09/29/11: EF 30-35% with inferior, posterior, lateral AK, anterior severe HK, mild LVH, mild MR, PASP 40;  => b. follow up echo 10/13:  EF 55-60%, Gr 1 diast dysfn, mild MR  . Corneal disorder   . Postmenopausal status   . Back pain   . Dysmetabolic syndrome X   . Arthritis     osteo...right knee  . Carpal tunnel syndrome   . GERD (gastroesophageal reflux disease)   . Diverticular disease   . Hx of adenomatous colonic polyps   . Hernia     hiatal  . Anxiety state     NOS  . Hypertension     essen. NOS  . Shoulder fracture   . HLD (hyperlipidemia)   . Migraine headache without aura 07/23/2012     Left sided  Lamonte Sakai thought to be migrainous  , hypersensitivity to touch , photophobia ,  no nausea.   . Headache, paroxysmal hemicrania, chronic 07/23/2012     Referral to BOTOX evaluation with dr Rexene Alberts or Krista Blue .  [name]  Heart attack ([place]) 09/2011  . 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     She has past surgical history that includes Appendectomy; Cholecystectomy; Abdominal hysterectomy; Breast lumpectomy (Right, 1996); Colonoscopy w/ polypectomy; lumbar spur removed; Total knee arthroplasty (Right); Bunionectomy (Right); ankle cystectomy (Right); rectocele surgery; Corneal transplant (Right, may 2012); Hemorrhoid surgery (12/01/2010, 2014); Cardiac catheterization (09/28/2011); Corneal transplant (Right, 08/30/2011); Corneal transplant (Left); and left heart catheterization with coronary angiogram (N/A, 09/28/2011).   Her family history includes Breast cancer in her mother, sister, and sister; Dementia in her father; Diabetes in her paternal grandfather and sister; Heart disease in her father and sister; Prostate cancer in her father; Stroke in her father. There is no history of Colon cancer.She reports that she has never smoked. She has never used smokeless tobacco. She reports that she drinks alcohol. She reports that she does not use illicit drugs.  Current Outpatient Prescriptions on File Prior to Visit  Medication Sig Dispense Refill  . aspirin 81 MG tablet Take 81 mg by mouth daily.    . brimonidine-timolol (COMBIGAN) 0.2-0.5 % ophthalmic solution Apply to eye.    . carvedilol (COREG) 3.125 MG tablet Take 1 tablet (3.125 mg total) by mouth 2 (two) times daily. 180 tablet 3  . carvedilol (COREG) 6.25 MG tablet  Take 1 tablet (6.25 mg total) by mouth 2 (two) times daily. 180 tablet 3  . cholecalciferol (VITAMIN D) 1000 UNITS tablet Take 2,000 Units by mouth daily.    . clopidogrel (PLAVIX) 75 MG tablet Take 1 tablet (75 mg total) by mouth daily with breakfast. 90 tablet 3  . diphenoxylate-atropine (LOMOTIL) 2.5-0.025 MG per tablet 1 to 2 tablets in the morning, then every 6 hours as needed for diarrhea. 720 tablet 3  . estradiol (ESTRACE) 1  MG tablet Take 5 mg by mouth daily.     [name] losartan (COZAAR) 50 MG tablet Take 1 tablet (50 mg total) by mouth daily. 1 by mouth once daily 90 tablet 3  . Na Sulfate-K Sulfate-Mg Sulf (SUPREP BOWEL PREP) SOLN Take 1 kit by mouth once. 1 Bottle 0  . nortriptyline (PAMELOR) 10 MG capsule Take 4 capsules (40 mg total) by mouth at bedtime. 360 capsule 3  . prednisoLONE acetate (PRED FORTE) 1 % ophthalmic suspension 1 drop twice a day in the right eye 1 drop daily in the left eye    . rosuvastatin (CRESTOR) 20 MG tablet Take 1 tablet (20 mg total) by mouth daily. 90 tablet 3  . zolpidem (AMBIEN) 10 MG tablet Take 1 tablet (10 mg total) by mouth at bedtime. 90 tablet 1  . nitroGLYCERIN (NITROSTAT) 0.4 MG SL tablet Place 1 tablet (0.4 mg total) under the tongue every 5 (five) minutes x 3 doses as needed for chest pain. (Patient not taking: Reported on 2020/12/214) 25 tablet prn   No current facility-administered medications on file prior to visit.     Objective:  Objective Physical Exam  Constitutional: She is oriented to person, place, and time. She appears well-developed and well-nourished.  HENT:  Head: Normocephalic and atraumatic.  Eyes: Conjunctivae and EOM are normal.  Neck: Normal range of motion. Neck supple. No JVD present. Carotid bruit is not present. No thyromegaly present.  Cardiovascular: Normal rate, regular rhythm and normal heart sounds.   No murmur heard. Pulmonary/Chest: Effort normal and breath sounds normal. No respiratory distress. She has no wheezes. She has no rales. She exhibits tenderness.    Musculoskeletal: She exhibits no edema.  Neurological: She is alert and oriented to person, place, and time.  Psychiatric: She has a normal mood and affect.   BP 144/82 mmHg  Pulse 85  Temp(Src) 97.8 F (36.6 C) (Oral)  Ht 5' 4"  (1.626 m)  Wt 212 lb 3.2 oz (96.253 kg)  BMI 36.41 kg/m2  SpO2 98% Wt Readings from Last 3 Encounters:  11/17/14 212 lb 3.2 oz (96.253 kg)    11/05/14 210 lb (95.255 kg)  10/28/14 212 lb 4 oz (96.276 kg)     Lab Results  Component Value Date   WBC 7.5 03/18/2013   HGB 13.6 03/18/2013   HCT 41.8 03/18/2013   PLT 278.0 03/18/2013   GLUCOSE 116* 11/17/2014   CHOL 185 11/17/2014   TRIG 173.0* 11/17/2014   HDL 49.40 11/17/2014   LDLDIRECT 140.8 03/18/2013   LDLCALC 101* 11/17/2014   ALT 12 11/17/2014   AST 14 11/17/2014   NA 135 11/17/2014   K 4.3 11/17/2014   CL 100 11/17/2014   CREATININE 0.87 11/17/2014   BUN 14 11/17/2014   CO2 28 11/17/2014   TSH 0.84 12/29/2013   INR 0.99 09/27/2011   HGBA1C 5.9 12/29/2013   MICROALBUR <0.7 11/17/2014    Mm Screening Breast Tomo Bilateral  02/17/2014   CLINICAL DATA:  Screening.  EXAM:  DIGITAL SCREENING BILATERAL MAMMOGRAM WITH 3D TOMO WITH CAD  COMPARISON:  Previous exam(s).  ACR Breast Density Category c: The breast tissue is heterogeneously dense, which may obscure small masses.  FINDINGS: There are no findings suspicious for malignancy. Images were processed with CAD.  IMPRESSION: No mammographic evidence of malignancy. A result letter of this screening mammogram will be mailed directly to the patient.  RECOMMENDATION: Screening mammogram in one year. (Code:SM-B-01Y)  BI-RADS CATEGORY  1: Negative.   Electronically Signed   By: Evangeline Dakin M.D.   On: 02/17/2014 08:57     Assessment & Plan:  Plan I am having Ms. Coen start on clobetasol ointment. I am also having her maintain her estradiol, cholecalciferol, nitroGLYCERIN, aspirin, carvedilol, carvedilol, clopidogrel, losartan, nortriptyline, rosuvastatin, zolpidem, prednisoLONE acetate, diphenoxylate-atropine, brimonidine-timolol, Na Sulfate-K Sulfate-Mg Sulf, and gabapentin. We will stop administering botulinum toxin Type A.  Meds ordered this encounter  Medications  . clobetasol ointment (TEMOVATE) 0.05 %    Sig: Apply 1 application topically 2 (two) times daily.    Dispense:  90 g    Refill:  3  . gabapentin  (NEURONTIN) 100 MG capsule    Sig: 2 po tid for pain    Dispense:  540 capsule    Refill:  3    Problem List Items Addressed This Visit    Hyperlipidemia    Check labs con't crestor      Relevant Orders   Lipid panel (Completed)   Microalbumin / creatinine urine ratio   POCT urinalysis dipstick (Completed)   Essential hypertension    con't coreg and cozaar Stable        Other Visit Diagnoses    Neuropathic pain    -  Primary    Relevant Medications    gabapentin (NEURONTIN) 100 MG capsule    Need for immunization against influenza        Relevant Orders    Flu vaccine HIGH DOSE PF (Fluzone High dose) (Completed)    Psoriasis        Relevant Medications    clobetasol ointment (TEMOVATE) 0.05 %    Family history of diabetes mellitus (DM)        Relevant Orders    COMPLETE METABOLIC PANEL WITH GFR (Completed)    CAD in native artery        Relevant Orders    Microalbumin / creatinine urine ratio    POCT urinalysis dipstick (Completed)    Dry mouth        Polydipsia           Follow-up: Return in about 6 months (around 05/18/2015), or if symptoms worsen or fail to improve, for hyperlipidemia, annual exam, fasting.  Garnet Koyanagi, DO

## 2014-11-20 DIAGNOSIS — L821 Other seborrheic keratosis: Secondary | ICD-10-CM | POA: Diagnosis not present

## 2014-11-20 DIAGNOSIS — M6740 Ganglion, unspecified site: Secondary | ICD-10-CM | POA: Diagnosis not present

## 2014-12-01 DIAGNOSIS — N898 Other specified noninflammatory disorders of vagina: Secondary | ICD-10-CM | POA: Diagnosis not present

## 2014-12-01 DIAGNOSIS — N952 Postmenopausal atrophic vaginitis: Secondary | ICD-10-CM | POA: Diagnosis not present

## 2014-12-02 ENCOUNTER — Ambulatory Visit: Admitting: Gastroenterology

## 2014-12-09 ENCOUNTER — Encounter: Payer: Medicare Other | Admitting: Gastroenterology

## 2014-12-10 ENCOUNTER — Ambulatory Visit (AMBULATORY_SURGERY_CENTER): Payer: Medicare Other | Admitting: Gastroenterology

## 2014-12-10 ENCOUNTER — Encounter: Payer: Medicare Other | Admitting: Gastroenterology

## 2014-12-10 ENCOUNTER — Encounter: Payer: Self-pay | Admitting: Gastroenterology

## 2014-12-10 VITALS — BP 123/66 | HR 53 | Temp 97.3°F | Resp 24 | Ht 63.0 in | Wt 212.0 lb

## 2014-12-10 DIAGNOSIS — Z860100 Personal history of colon polyps, unspecified: Secondary | ICD-10-CM

## 2014-12-10 DIAGNOSIS — F329 Major depressive disorder, single episode, unspecified: Secondary | ICD-10-CM | POA: Diagnosis not present

## 2014-12-10 DIAGNOSIS — I2510 Atherosclerotic heart disease of native coronary artery without angina pectoris: Secondary | ICD-10-CM | POA: Diagnosis not present

## 2014-12-10 DIAGNOSIS — D122 Benign neoplasm of ascending colon: Secondary | ICD-10-CM

## 2014-12-10 DIAGNOSIS — K589 Irritable bowel syndrome without diarrhea: Secondary | ICD-10-CM | POA: Diagnosis not present

## 2014-12-10 DIAGNOSIS — D125 Benign neoplasm of sigmoid colon: Secondary | ICD-10-CM | POA: Diagnosis not present

## 2014-12-10 DIAGNOSIS — I10 Essential (primary) hypertension: Secondary | ICD-10-CM | POA: Diagnosis not present

## 2014-12-10 DIAGNOSIS — I252 Old myocardial infarction: Secondary | ICD-10-CM | POA: Diagnosis not present

## 2014-12-10 DIAGNOSIS — I255 Ischemic cardiomyopathy: Secondary | ICD-10-CM | POA: Diagnosis not present

## 2014-12-10 MED ORDER — SODIUM CHLORIDE 0.9 % IV SOLN: 500.0000 mL | INTRAVENOUS | Status: DC

## 2014-12-10 NOTE — Patient Instructions (Signed)
YOU HAD AN ENDOSCOPIC PROCEDURE TODAY AT THE [facility]:   Refer to the procedure report that was given to you for any specific questions about what was found during the examination.  If the procedure report does not answer your questions, please call your gastroenterologist to clarify.  If you requested that your care partner not be given the details of your procedure findings, then the procedure report has been included in a sealed envelope for you to review at your convenience later.  YOU SHOULD EXPECT: Some feelings of bloating in the abdomen. Passage of more gas than usual.  Walking can help get rid of the air that was put into your GI tract during the procedure and reduce the bloating. If you had a lower endoscopy (such as a colonoscopy or flexible sigmoidoscopy) you may notice spotting of blood in your stool or on the toilet paper. If you underwent a bowel prep for your procedure, you may not have a normal bowel movement for a few days.  Please Note:  You might notice some irritation and congestion in your nose or some drainage.  This is from the oxygen used during your procedure.  There is no need for concern and it should clear up in a day or so.  SYMPTOMS TO REPORT IMMEDIATELY:   Following lower endoscopy (colonoscopy or flexible sigmoidoscopy):  Excessive amounts of blood in the stool  Significant tenderness or worsening of abdominal pains  Swelling of the abdomen that is new, acute  Fever of 100F or higher   For urgent or emergent issues, a gastroenterologist can be reached at any hour by calling [phone].   DIET: Your first meal following the procedure should be a small meal and then it is ok to progress to your normal diet. Heavy or fried foods are harder to digest and may make you feel nauseous or bloated.  Likewise, meals heavy in dairy and vegetables can increase bloating.  Drink plenty of fluids but you should avoid alcoholic beverages for 24  hours.  ACTIVITY:  You should plan to take it easy for the rest of today and you should NOT DRIVE or use heavy machinery until tomorrow (because of the sedation medicines used during the test).    FOLLOW UP: Our staff will call the number listed on your records the next business day following your procedure to check on you and address any questions or concerns that you may have regarding the information given to you following your procedure. If we do not reach you, we will leave a message.  However, if you are feeling well and you are not experiencing any problems, there is no need to return our call.  We will assume that you have returned to your regular daily activities without incident.  If any biopsies were taken you will be contacted by phone or by letter within the next 1-3 weeks.  Please call us at [phone] if you have not heard about the biopsies in 3 weeks.    SIGNATURES/CONFIDENTIALITY: You and/or your care partner have signed paperwork which will be entered into your electronic medical record.  These signatures attest to the fact that that the information above on your After Visit Summary has been reviewed and is understood.  Full responsibility of the confidentiality of this discharge information lies with you and/or your care-partner. 

## 2014-12-10 NOTE — Progress Notes (Signed)
Report to PACU, RN, vss, BBS= Clear.  

## 2014-12-10 NOTE — Progress Notes (Signed)
Called to room to assist during endoscopic procedure.  Patient ID and intended procedure confirmed with present staff. Received instructions for my participation in the procedure from the performing physician.  

## 2014-12-10 NOTE — Op Note (Signed)
[facility]  [address] [place], [address]   COLONOSCOPY PROCEDURE REPORT  PATIENT: Connie Owens, Connie Owens  MR#: [id number] BIRTHDATE: 04/28/1945 , 69  yrs. old GENDER: female ENDOSCOPIST: Harl Bowie, MD REFERRED DC:VUDTHY Dunmore, DO PROCEDURE DATE:  12/10/2014 PROCEDURE:   Colonoscopy, surveillance , Colonoscopy with cold biopsy polypectomy, and Colonoscopy with snare polypectomy First Screening Colonoscopy - Avg.  risk and is 50 yrs.  old or older - No.  Prior Negative Screening - Now for repeat screening. N/A  History of Adenoma - Now for follow-up colonoscopy & has been > or = to 3 yrs.  No.  It has been less than 3 yrs since last colonoscopy.  Other: See Comments  Polyps removed today? Yes ASA CLASS:   Class II INDICATIONS:average risk patient for colon cancer. MEDICATIONS: Propofol 300 mg IV  DESCRIPTION OF PROCEDURE:   After the risks benefits and alternatives of the procedure were thoroughly explained, informed consent was obtained.  The digital rectal exam revealed no abnormalities of the rectum.   The LB [id number]  endoscope was introduced through the anus and advanced to the ileocecal valve. No adverse events experienced.   The quality of the prep was good.  The instrument was then slowly withdrawn as the colon was fully examined. Estimated blood loss is zero unless otherwise noted in this procedure report.  COLON FINDINGS: Boston prep score 3-3-3 (9) 2 mm sessile polyp in ascending colon removed by cold biopsy forceps.  5 mm sessile polyp in ascending colon removed by cold snare, specimen retrieved.Retroflexion performed in ascending colon.3 mm sessile polyp in sigmoid colon removed by cold biopsy forceps. 5 mm sessile polyp in rectum removed by cold snare, specimen retrieved.Small internal hemorrhoids on retroflexion, few areas of hypovascularity noted in rectum with some scarring.  Retroflexed views revealed internal hemorrhoids. The  time to cecum = 3.4 Withdrawal time = 19.3   The scope was withdrawn and the procedure completed. COMPLICATIONS: There were no immediate complications.  ENDOSCOPIC IMPRESSION: 4 small sessile polyps removed  RECOMMENDATIONS: 1.  If the polyp(s) removed today are proven to be adenomatous (pre-cancerous) polyps, you will need a colonoscopy in 3 years. Otherwise you should continue to follow colorectal cancer screening guidelines for "routine risk" patients with a colonoscopy in 10 years.  You will receive a letter within 1-2 weeks with the results of your biopsy as well as final recommendations.  Please call my office if you have not received a letter after 3 weeks. 1.  Await pathology results  eSigned:  Harl Bowie, MD 12/10/2014 2:39 PM    PATIENT NAME:  [name], [name] MR#: [id number]

## 2014-12-11 ENCOUNTER — Telehealth: Payer: Self-pay | Admitting: Emergency Medicine

## 2014-12-11 NOTE — Telephone Encounter (Signed)
  Follow up Call-  Call back number 12/10/2014  Post procedure Call Back phone  # [phone]  Permission to leave phone message Yes     Patient questions:  Do you have a fever, pain , or abdominal swelling? No. Pain Score  0 *  Have you tolerated food without any problems? Yes.    Have you been able to return to your normal activities? Yes.    Do you have any questions about your discharge instructions: Diet   No. Medications  No. Follow up visit  No.  Do you have questions or concerns about your Care? No.  Actions: * If pain score is 4 or above: No action needed, pain <4.

## 2014-12-14 ENCOUNTER — Telehealth: Payer: Self-pay | Admitting: Family Medicine

## 2014-12-14 NOTE — Telephone Encounter (Signed)
I will forward to the provider.     KP

## 2014-12-14 NOTE — Telephone Encounter (Signed)
Caller name:Ruhani Bertini Relationship to patient:self Can be [phone](may leave detailed message) Pharmacy:  Reason for call:Insurance has denied to pay for lab work 11/17/14. States procedure code used was 80053. Patient has Medicare and [employer]. Please advise

## 2014-12-14 NOTE — Telephone Encounter (Signed)
i don't know what code that is-- I would have attached it to hyperlipidemia,  Polydipsia, neuropathy and all other codes used that day

## 2014-12-17 ENCOUNTER — Encounter: Payer: Self-pay | Admitting: Gastroenterology

## 2014-12-17 ENCOUNTER — Encounter: Payer: Medicare Other | Admitting: Gastroenterology

## 2014-12-30 ENCOUNTER — Other Ambulatory Visit: Payer: Self-pay | Admitting: Cardiology

## 2014-12-30 DIAGNOSIS — I214 Non-ST elevation (NSTEMI) myocardial infarction: Secondary | ICD-10-CM

## 2014-12-30 NOTE — Telephone Encounter (Signed)
Relation to [id number] Call back [place]   Reason for call:  Patient checking on the status of below message

## 2014-12-30 NOTE — Telephone Encounter (Signed)
°*  STAT* If patient is at the pharmacy, call can be transferred to refill team.   1. Which medications need to be refilled? (please list name of each medication and dose if known) Clopidogrel,Losartan,Carvedilol(both mg) Nitro Stat-new Ins [employer]-new prescriptions 2. Which pharmacy/location (including street and city if local pharmacy) is medication to be sent to?[facility]  3. Do they need a 30 day or 90 day supply? 90 days and refills

## 2014-12-31 MED ORDER — NITROGLYCERIN 0.4 MG SL SUBL: 0.4000 mg | SUBLINGUAL_TABLET | SUBLINGUAL | Status: DC | PRN

## 2014-12-31 MED ORDER — CARVEDILOL 3.125 MG PO TABS: 3.1250 mg | ORAL_TABLET | Freq: Two times a day (BID) | ORAL | Status: DC

## 2014-12-31 MED ORDER — CARVEDILOL 6.25 MG PO TABS: 6.2500 mg | ORAL_TABLET | Freq: Two times a day (BID) | ORAL | Status: DC

## 2014-12-31 MED ORDER — CARVEDILOL 6.25 MG PO TABS
6.2500 mg | ORAL_TABLET | Freq: Two times a day (BID) | ORAL | Status: DC
Start: 2014-12-31 — End: 2015-06-14

## 2014-12-31 MED ORDER — LOSARTAN POTASSIUM 50 MG PO TABS: 50.0000 mg | ORAL_TABLET | Freq: Every day | ORAL | Status: DC

## 2014-12-31 MED ORDER — CLOPIDOGREL BISULFATE 75 MG PO TABS: 75.0000 mg | ORAL_TABLET | Freq: Every day | ORAL | Status: DC

## 2014-12-31 NOTE — Telephone Encounter (Signed)
Pt's Rx was sent to pt's pharmacy requested. Confirmation received. °

## 2014-12-31 NOTE — Addendum Note (Signed)
Addended by: Derl Barrow on: 12/31/2014 03:06 PM   Modules accepted: Orders

## 2014-12-31 NOTE — Addendum Note (Signed)
Addended by: Derl Barrow on: 12/31/2014 03:07 PM   Modules accepted: Orders

## 2015-01-09 ENCOUNTER — Ambulatory Visit (INDEPENDENT_AMBULATORY_CARE_PROVIDER_SITE_OTHER): Payer: Medicare Other | Admitting: Family Medicine

## 2015-01-09 VITALS — BP 136/78 | HR 88 | Temp 98.1°F | Resp 18 | Ht 65.0 in | Wt 204.8 lb

## 2015-01-09 DIAGNOSIS — J0110 Acute frontal sinusitis, unspecified: Secondary | ICD-10-CM | POA: Diagnosis not present

## 2015-01-09 DIAGNOSIS — I2510 Atherosclerotic heart disease of native coronary artery without angina pectoris: Secondary | ICD-10-CM | POA: Diagnosis not present

## 2015-01-09 MED ORDER — AMOXICILLIN 500 MG PO CAPS: 1000.0000 mg | ORAL_CAPSULE | Freq: Two times a day (BID) | ORAL | Status: DC

## 2015-01-09 NOTE — Patient Instructions (Signed)
We are going to treat you for a sinus infection- use the amoxicillin as directed.  You can continue to use OTC medications as needed Let me know if you do not feel better in the next few days- Sooner if worse.

## 2015-01-09 NOTE — Progress Notes (Signed)
[facility] [address] [place] [address] [phone]  Date:  01/09/2015   Name:  Connie Owens   DOB:  1945/08/22   MRN:  [id number]  PCP:  Garnet Koyanagi, DO    Chief Complaint: Sinusitis; Sore Throat; and Cough   History of Present Illness:  Connie Owens is a 69 y.o. very pleasant female patient who presents with the following:  History of CAD, HTN.  Here today as a new patient with complaint of illness- she notes sinsu pressure for about 6 days now. She thought it would get better but it has not She also has some cough. However the main issue is still her sinuses. She thinks that she may have had a temperature and treated with OTC tylenol No GI symptoms She can have a runny nose "when it loosens up a little bit." No body aches or chills.       Patient Active Problem List   Diagnosis Date Noted  . History of colonic polyps 01-28-2015  . CAD (coronary artery disease) 07/23/2014  . Glaucoma (increased eye pressure) 07/30/2013  . OSA on CPAP 07/30/2013  . Obesity, unspecified 07/30/2013  . Obesity (BMI 30-39.9) 03/18/2013  . Abdominal pain, other specified site 03/18/2013  . ERRONEOUS ENCOUNTER--DISREGARD 03/06/2013  . Internal hemorrhoids with other complication 63/84/6659  . Nonspecific abnormal finding in stool contents 11/05/2012  . Abdominal pain, left upper quadrant 11/05/2012  . Diffuse pain 07/24/2012  . Migraine headache without aura 07/23/2012  . Headache, paroxysmal hemicrania, chronic 07/23/2012  . OSA (obstructive sleep apnea) 06/11/2012  . NSTEMI (non-ST elevated myocardial infarction) ([place]) 09/28/2011  . IBS (irritable bowel syndrome) 03/22/2011  . Hyperlipidemia 12/21/2010  . DIARRHEA, CHRONIC 01/13/2010  . COCCYGEAL PAIN 05/11/2009  . Anorectal pain 02/02/2009  . HIP PAIN, LEFT 02/02/2009  . CORNEAL DISORDER 12/11/2008  . POSTMENOPAUSAL STATUS 12/11/2008  . ADVERSE DRUG REACTION 12/11/2008  . DYSPHAGIA UNSPECIFIED  10/02/2008  . BACK PAIN 07/15/2008  . Dysmetabolic syndrome X 93/57/0177  . OSTEOARTHRITIS, KNEE, RIGHT 06/19/2007  . CARPAL TUNNEL SYNDROME 04/25/2007  . ADENOMATOUS COLONIC POLYP 04/23/2007  . GERD 04/23/2007  . DIVERTICULAR DISEASE 04/23/2007  . HIATAL HERNIA 11/16/2006  . ANXIETY STATE NOS 11/12/2006  . Essential hypertension 10/30/2006    Past Medical History  Diagnosis Date  . CAD (coronary artery disease)     NSTEMI 8/13 => LHC 09/27/11: oLM 50%, dLM 30%, oLAD 60-70%, pLAD 50%, mLAD 70%, pD1 30%, pD2 40%, oRCA 25%, mRCA 40%, EF 50% with ant HK => LAD not amenable to PCI => med Rx  unless recurrent angina = > consider CABG  . Ischemic cardiomyopathy     a.  Echo 09/29/11: EF 30-35% with inferior, posterior, lateral AK, anterior severe HK, mild LVH, mild MR, PASP 40;  => b. follow up echo 10/13:  EF 55-60%, Gr 1 diast dysfn, mild MR  . Corneal disorder   . Postmenopausal status   . Back pain   . Dysmetabolic syndrome X   . Arthritis     osteo...right knee  . Carpal tunnel syndrome   . GERD (gastroesophageal reflux disease)   . Diverticular disease   . Hx of adenomatous colonic polyps   . Hernia     hiatal  . Anxiety state     NOS  . Hypertension     essen. NOS  . Shoulder fracture   . HLD (hyperlipidemia)   . Migraine headache without aura 07/23/2012  Left sided  Lamonte Sakai thought to be migrainous  , hypersensitivity to touch , photophobia ,  no nausea.   . Headache, paroxysmal hemicrania, chronic 07/23/2012     Referral to BOTOX evaluation with dr Rexene Alberts or Krista Blue .  [name] Heart attack ([place]) 09/2011  . 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     Past Surgical History  Procedure Laterality Date  . Appendectomy    . Cholecystectomy    . Abdominal hysterectomy    . Breast lumpectomy Right 1996  . Colonoscopy w/ polypectomy    . Lumbar spur removed    . Total knee arthroplasty Right   . Bunionectomy Right    . Ankle cystectomy Right   . Rectocele surgery    . 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 Hemorrhoid surgery  12/01/2010, 2014    [place] hemorrhoid ligation/pexy  . Cardiac catheterization  09/28/2011    Dr Acie Fredrickson(  to see Dr Ron Parker)  . 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 Corneal transplant Left     x1  . Left heart catheterization with coronary angiogram N/A 09/28/2011    Procedure: LEFT HEART CATHETERIZATION WITH CORONARY ANGIOGRAM;  Surgeon: Minus Breeding, MD;  Location: [facility] CATH LAB;  Service: Cardiovascular;  Laterality: N/A;    Social History  Substance Use Topics  . Smoking status: Never Smoker   . Smokeless tobacco: Never Used  . Alcohol Use: Yes     Comment: rare    Family History  Problem Relation Age of Onset  . Breast cancer Mother   . Heart disease Father   . Stroke Father   . Dementia Father   . Prostate cancer Father   . Diabetes Sister     3 sisters   . Heart disease Sister     1 sister CABG  . Breast cancer Sister     PTE pre & post Dx of ca  . Diabetes Paternal Grandfather   . Colon cancer Neg Hx   . Breast cancer Sister     Allergies  Allergen Reactions  . Iohexol     rash  . Iodine   . Tape     Plastic tape  . Imdur [Isosorbide]     Severe headaches  . Latex Rash  . Verapamil     REACTION: dizziness    Medication list has been reviewed and updated.  Current Outpatient Prescriptions on File Prior to Visit  Medication Sig Dispense Refill  . aspirin 81 MG tablet Take 81 mg by mouth daily.    . brimonidine-timolol (COMBIGAN) 0.2-0.5 % ophthalmic solution Apply to eye.    . carvedilol (COREG) 3.125 MG tablet Take 1 tablet (3.125 mg total) by mouth 2 (two) times daily. 180 tablet 1  . carvedilol (COREG) 6.25 MG tablet Take 1 tablet (6.25 mg total) by mouth 2 (two) times daily. 180 tablet 1  . cholecalciferol (VITAMIN D) 1000 UNITS tablet Take 2,000 Units by mouth daily.    . clobetasol ointment (TEMOVATE) 6.30 % Apply 1 application  topically 2 (two) times daily. 90 g 3  . clopidogrel (PLAVIX) 75 MG tablet Take 1 tablet (75 mg total) by mouth daily with breakfast. 90 tablet 1  . diphenoxylate-atropine (LOMOTIL) 2.5-0.025 MG per tablet 1 to 2 tablets in the morning, then every 6 hours as needed for diarrhea. 720 tablet 3  . estradiol (ESTRACE) 1 MG tablet Take 5 mg  by mouth daily.     [name] gabapentin (NEURONTIN) 100 MG capsule 2 po tid for pain 540 capsule 3  . losartan (COZAAR) 50 MG tablet Take 1 tablet (50 mg total) by mouth daily. 1 by mouth once daily 90 tablet 1  . nitroGLYCERIN (NITROSTAT) 0.4 MG SL tablet Place 1 tablet (0.4 mg total) under the tongue every 5 (five) minutes x 3 doses as needed for chest pain. 25 tablet 5  . nortriptyline (PAMELOR) 10 MG capsule Take 4 capsules (40 mg total) by mouth at bedtime. 360 capsule 3  . prednisoLONE acetate (PRED FORTE) 1 % ophthalmic suspension 1 drop twice a day in the right eye 1 drop daily in the left eye    . rosuvastatin (CRESTOR) 20 MG tablet Take 1 tablet (20 mg total) by mouth daily. 90 tablet 3  . zolpidem (AMBIEN) 10 MG tablet Take 1 tablet (10 mg total) by mouth at bedtime. 90 tablet 1  . Na Sulfate-K Sulfate-Mg Sulf (SUPREP BOWEL PREP) SOLN Take 1 kit by mouth once. (Patient not taking: Reported on 01/09/2015) 1 Bottle 0   No current facility-administered medications on file prior to visit.    Review of Systems:  As per HPI- otherwise negative.   Physical Examination: Filed Vitals:   01/09/15 1140  BP: 136/78  Pulse: 103  Temp: 98.1 F (36.7 C)  Resp: 18   Filed Vitals:   01/09/15 1140  Height: $Remove'5\' 5"'FkyaKVb$  (1.651 m)  Weight: 204 lb 12.8 oz (92.897 kg)   Body mass index is 34.08 kg/(m^2). Ideal Body Weight: Weight in (lb) to have BMI = 25: 149.9  GEN: WDWN, NAD, Non-toxic, A & O x 3, obese, looks well except congested  HEENT: Atraumatic, Normocephalic. Neck supple. No masses, No LAD. Bilateral TM wnl, oropharynx normal.  PEERL,EOMI.  Nasal cavity is  congested, anterior facial sinuses are tender to percussion Ears and Nose: No external deformity. CV: RRR, No M/G/R. No JVD. No thrill. No extra heart sounds. PULM: CTA B, no wheezes, crackles, rhonchi. No retractions. No resp. distress. No accessory muscle use. EXTR: No c/c/e NEURO Normal gait.  PSYCH: Normally interactive. Conversant. Not depressed or anxious appearing.  Calm demeanor.    Assessment and Plan: Acute frontal sinusitis, recurrence not specified - Plan: amoxicillin (AMOXIL) 500 MG capsule  Treat for a sinus infection with amoxicillin Encouraged yogurt and/ or probiotics while on the medication Continue OTC meds as needed Let me know if not better soon- Sooner if worse.      Signed Lamar Blinks, MD

## 2015-01-27 ENCOUNTER — Telehealth: Payer: Self-pay | Admitting: Neurology

## 2015-01-27 NOTE — Telephone Encounter (Signed)
It appears this Rx was written for 3 months plus 1 refill in July.  This Rx should last until Jan, so it is a bit too soon to refill at this time.  I called back and spoke with the patient.  She expressed understanding.  Says it takes mail service 14 days to process and ship her Rx, so she was trying to get a head start on the request.

## 2015-01-27 NOTE — Telephone Encounter (Signed)
Patient called to request prescription renewal for zolpidem (AMBIEN) 10 MG tablet, Meds by mail service center, [facility]

## 2015-02-09 ENCOUNTER — Other Ambulatory Visit: Payer: Self-pay

## 2015-02-09 MED ORDER — ZOLPIDEM TARTRATE 10 MG PO TABS: 10.0000 mg | ORAL_TABLET | Freq: Every day | ORAL | Status: DC

## 2015-02-25 DIAGNOSIS — Z96651 Presence of right artificial knee joint: Secondary | ICD-10-CM | POA: Diagnosis not present

## 2015-02-25 DIAGNOSIS — M1712 Unilateral primary osteoarthritis, left knee: Secondary | ICD-10-CM | POA: Diagnosis not present

## 2015-02-25 DIAGNOSIS — Z471 Aftercare following joint replacement surgery: Secondary | ICD-10-CM | POA: Diagnosis not present

## 2015-03-16 DIAGNOSIS — H01002 Unspecified blepharitis right lower eyelid: Secondary | ICD-10-CM | POA: Diagnosis not present

## 2015-03-16 DIAGNOSIS — H01004 Unspecified blepharitis left upper eyelid: Secondary | ICD-10-CM | POA: Diagnosis not present

## 2015-03-16 DIAGNOSIS — H04123 Dry eye syndrome of bilateral lacrimal glands: Secondary | ICD-10-CM | POA: Diagnosis not present

## 2015-03-16 DIAGNOSIS — H01005 Unspecified blepharitis left lower eyelid: Secondary | ICD-10-CM | POA: Diagnosis not present

## 2015-03-16 DIAGNOSIS — H0289 Other specified disorders of eyelid: Secondary | ICD-10-CM | POA: Diagnosis not present

## 2015-03-25 DIAGNOSIS — H1013 Acute atopic conjunctivitis, bilateral: Secondary | ICD-10-CM | POA: Diagnosis not present

## 2015-04-02 DIAGNOSIS — H0289 Other specified disorders of eyelid: Secondary | ICD-10-CM | POA: Diagnosis not present

## 2015-04-02 DIAGNOSIS — H04123 Dry eye syndrome of bilateral lacrimal glands: Secondary | ICD-10-CM | POA: Diagnosis not present

## 2015-04-02 DIAGNOSIS — Z947 Corneal transplant status: Secondary | ICD-10-CM | POA: Diagnosis not present

## 2015-04-06 DIAGNOSIS — H1849 Other corneal degeneration: Secondary | ICD-10-CM | POA: Diagnosis not present

## 2015-04-06 DIAGNOSIS — Z947 Corneal transplant status: Secondary | ICD-10-CM | POA: Diagnosis not present

## 2015-04-06 DIAGNOSIS — H52211 Irregular astigmatism, right eye: Secondary | ICD-10-CM | POA: Diagnosis not present

## 2015-04-15 ENCOUNTER — Ambulatory Visit (INDEPENDENT_AMBULATORY_CARE_PROVIDER_SITE_OTHER)
Admission: RE | Admit: 2015-04-15 | Discharge: 2015-04-15 | Disposition: A | Discharge status: Home / Self Care | Payer: Medicare Other | Source: Ambulatory Visit | Attending: Physician Assistant | Admitting: Physician Assistant

## 2015-04-15 ENCOUNTER — Other Ambulatory Visit: Payer: Self-pay

## 2015-04-15 ENCOUNTER — Encounter: Payer: Self-pay | Admitting: Physician Assistant

## 2015-04-15 ENCOUNTER — Other Ambulatory Visit (INDEPENDENT_AMBULATORY_CARE_PROVIDER_SITE_OTHER): Payer: Medicare Other

## 2015-04-15 ENCOUNTER — Telehealth: Payer: Self-pay | Admitting: Gastroenterology

## 2015-04-15 ENCOUNTER — Ambulatory Visit (INDEPENDENT_AMBULATORY_CARE_PROVIDER_SITE_OTHER): Payer: Medicare Other | Admitting: Physician Assistant

## 2015-04-15 VITALS — BP 130/70 | HR 66 | Ht 63.5 in | Wt 199.8 lb

## 2015-04-15 DIAGNOSIS — R1032 Left lower quadrant pain: Secondary | ICD-10-CM | POA: Diagnosis not present

## 2015-04-15 DIAGNOSIS — D369 Benign neoplasm, unspecified site: Secondary | ICD-10-CM

## 2015-04-15 LAB — URINALYSIS
BILIRUBIN URINE: NEGATIVE
HGB URINE DIPSTICK: NEGATIVE
Ketones, ur: NEGATIVE
Leukocytes, UA: NEGATIVE
NITRITE: NEGATIVE
Specific Gravity, Urine: <=1.005 — AB (ref 1.000–1.030)
TOTAL PROTEIN, URINE-UPE24: NEGATIVE
URINE GLUCOSE: NEGATIVE
UROBILINOGEN UA: 0.2 (ref 0.0–1.0)
pH: 6 (ref 5.0–8.0)

## 2015-04-15 LAB — CBC WITH DIFFERENTIAL/PLATELET
BASOS ABS: 0 10*3/uL (ref 0.0–0.1)
Basophils Relative: 0.4 % (ref 0.0–3.0)
EOS ABS: 0.1 10*3/uL (ref 0.0–0.7)
Eosinophils Relative: 1.1 % (ref 0.0–5.0)
HEMATOCRIT: 40.1 % (ref 36.0–46.0)
HEMOGLOBIN: 13.2 g/dL (ref 12.0–15.0)
LYMPHS PCT: 19.9 % (ref 12.0–46.0)
Lymphs Abs: 2.2 10*3/uL (ref 0.7–4.0)
MCHC: 33 g/dL (ref 30.0–36.0)
MCV: 85.5 fl (ref 78.0–100.0)
Monocytes Absolute: 0.9 10*3/uL (ref 0.1–1.0)
Monocytes Relative: 7.9 % (ref 3.0–12.0)
Neutro Abs: 7.7 10*3/uL (ref 1.4–7.7)
Neutrophils Relative %: 70.7 % (ref 43.0–77.0)
Platelets: 322 10*3/uL (ref 150.0–400.0)
RBC: 4.68 Mil/uL (ref 3.87–5.11)
RDW: 16.9 % — ABNORMAL HIGH (ref 11.5–15.5)
WBC: 10.9 10*3/uL — AB (ref 4.0–10.5)

## 2015-04-15 LAB — BASIC METABOLIC PANEL
BUN: 10 mg/dL (ref 6–23)
CO2: 28 mEq/L (ref 19–32)
Calcium: 9.6 mg/dL (ref 8.4–10.5)
Chloride: 102 mEq/L (ref 96–112)
Creatinine, Ser: 0.76 mg/dL (ref 0.40–1.20)
GFR: 79.97 mL/min (ref >60.00)
Glucose, Bld: 121 mg/dL — ABNORMAL HIGH (ref 70–99)
POTASSIUM: 4.6 meq/L (ref 3.5–5.1)
Sodium: 135 mEq/L (ref 135–145)

## 2015-04-15 MED ORDER — CIPROFLOXACIN HCL 500 MG PO TABS: 500.0000 mg | ORAL_TABLET | Freq: Two times a day (BID) | ORAL | Status: DC

## 2015-04-15 MED ORDER — METRONIDAZOLE 500 MG PO TABS: 500.0000 mg | ORAL_TABLET | Freq: Two times a day (BID) | ORAL | Status: DC

## 2015-04-15 NOTE — Progress Notes (Signed)
Patient ID: Connie Owens, female   DOB: [birth year], 70 y.o.   MRN: [id number]   Subjective:    Patient ID: Connie Owens, female    DOB: March 30, 1945, 70 y.o.   MRN: [id number]  HPI [name] is a pleasant 70 year old white female now established with Dr. Silverio Decamp who comes in today as an urgent work in with acute left lower quadrant pain. She says she had onset 4 days ago and pain has been constant and mildly progressive since. She is not having any radiation into her back but says she does feel pressure into her rectum with sitting. She has noticed an exacerbation with walking and describes the pain as a constant pressure was some throbbing. There's been no fever or chills, no nausea or vomiting, no dysuria hematuria urgency or frequency. She has not noticed any change in her bowel habits and no melena or hematochezia. Patient is quite concerned because she is supposed to leave in 2 days to go on a  70th birthday  trip to [place] and the [place]. She undergone colonoscopy in October 2016 was found to have 4 sessile polyps all of which were removed and were tubular adenomas. She is to follow-up in 3 years. There was no diverticulosis commented on. Other medical problems include coronary artery disease near, is status post cholecystectomy and hysterectomy. She is maintained on Plavix and aspirin.  Review of Systems Pertinent positive and negative review of systems were noted in the above HPI section.  All other review of systems was otherwise negative.  Outpatient Encounter Prescriptions as of 04/15/2015  Medication Sig  . aspirin 81 MG tablet Take 81 mg by mouth daily.  . brimonidine-timolol (COMBIGAN) 0.2-0.5 % ophthalmic solution Apply to eye.  . carvedilol (COREG) 3.125 MG tablet Take 1 tablet (3.125 mg total) by mouth 2 (two) times daily.  . carvedilol (COREG) 6.25 MG tablet Take 1 tablet (6.25 mg total) by mouth 2 (two) times daily.  . cholecalciferol (VITAMIN D) 1000 UNITS tablet Take  2,000 Units by mouth daily.  . clobetasol ointment (TEMOVATE) 7.82 % Apply 1 application topically 2 (two) times daily.  . clopidogrel (PLAVIX) 75 MG tablet Take 1 tablet (75 mg total) by mouth daily with breakfast.  . diphenoxylate-atropine (LOMOTIL) 2.5-0.025 MG per tablet 1 to 2 tablets in the morning, then every 6 hours as needed for diarrhea.  . estradiol (ESTRACE) 1 MG tablet Take 5 mg by mouth daily.   [name] gabapentin (NEURONTIN) 100 MG capsule 2 po tid for pain  . losartan (COZAAR) 50 MG tablet Take 1 tablet (50 mg total) by mouth daily. 1 by mouth once daily  . nitroGLYCERIN (NITROSTAT) 0.4 MG SL tablet Place 1 tablet (0.4 mg total) under the tongue every 5 (five) minutes x 3 doses as needed for chest pain.  . nortriptyline (PAMELOR) 10 MG capsule Take 4 capsules (40 mg total) by mouth at bedtime.  . predniSONE 5 MG/5ML solution Take by mouth daily with breakfast. Used as EYE DROPS 1%  . rosuvastatin (CRESTOR) 20 MG tablet Take 1 tablet (20 mg total) by mouth daily.  [name] zolpidem (AMBIEN) 10 MG tablet Take 1 tablet (10 mg total) by mouth at bedtime.  . [DISCONTINUED] amoxicillin (AMOXIL) 500 MG capsule Take 2 capsules (1,000 mg total) by mouth 2 (two) times daily. (Patient not taking: Reported on 04/15/2015)  . [DISCONTINUED] Na Sulfate-K Sulfate-Mg Sulf (SUPREP BOWEL PREP) SOLN Take 1 kit by mouth once. (Patient not taking: Reported on 01/09/2015)  . [  DISCONTINUED] prednisoLONE acetate (PRED FORTE) 1 % ophthalmic suspension Reported on 04/15/2015   No facility-administered encounter medications on file as of 04/15/2015.   Allergies  Allergen Reactions  . Iohexol     rash  . Iodine   . Tape     Plastic tape  . Imdur [Isosorbide]     Severe headaches  . Latex Rash  . Verapamil     REACTION: dizziness   Patient Active Problem List   Diagnosis Date Noted  . History of colonic polyps 25-Nov-202016  . CAD (coronary artery disease) 07/23/2014  . Glaucoma (increased eye pressure) 07/30/2013    . OSA on CPAP 07/30/2013  . Obesity, unspecified 07/30/2013  . Obesity (BMI 30-39.9) 03/18/2013  . Abdominal pain, other specified site 03/18/2013  . ERRONEOUS ENCOUNTER--DISREGARD 03/06/2013  . Internal hemorrhoids with other complication 16/11/9602  . Nonspecific abnormal finding in stool contents 11/05/2012  . Abdominal pain, left upper quadrant 11/05/2012  . Diffuse pain 07/24/2012  . Migraine headache without aura 07/23/2012  . Headache, paroxysmal hemicrania, chronic 07/23/2012  . OSA (obstructive sleep apnea) 06/11/2012  . NSTEMI (non-ST elevated myocardial infarction) ([place]) 09/28/2011  . IBS (irritable bowel syndrome) 03/22/2011  . Hyperlipidemia 12/21/2010  . DIARRHEA, CHRONIC 01/13/2010  . COCCYGEAL PAIN 05/11/2009  . Anorectal pain 02/02/2009  . HIP PAIN, LEFT 02/02/2009  . CORNEAL DISORDER 12/11/2008  . POSTMENOPAUSAL STATUS 12/11/2008  . ADVERSE DRUG REACTION 12/11/2008  . DYSPHAGIA UNSPECIFIED 10/02/2008  . BACK PAIN 07/15/2008  . Dysmetabolic syndrome X 54/10/8117  . OSTEOARTHRITIS, KNEE, RIGHT 06/19/2007  . CARPAL TUNNEL SYNDROME 04/25/2007  . ADENOMATOUS COLONIC POLYP 04/23/2007  . GERD 04/23/2007  . DIVERTICULAR DISEASE 04/23/2007  . HIATAL HERNIA 11/16/2006  . ANXIETY STATE NOS 11/12/2006  . Essential hypertension 10/30/2006   Social History   Social History  . Marital Status: Widowed    Spouse Name: Ruthann Cancer  . Number of Children: 3  . Years of Education: 14   Occupational History  . retired   .     Social History Main Topics  . Smoking status: Never Smoker   . Smokeless tobacco: Never Used  . Alcohol Use: Yes     Comment: rare  . Drug Use: No  . Sexual Activity: Not on file   Other Topics Concern  . Not on file   Social History Narrative   Patient is widowed,([place] past 10/02/13) and lives at home with her husband.   Patient has three adult children.   Patient has a college education.   Patient is right-handed.   Patient drinks  two cups of coffee daily.   Regular exercise   Patient is retired.    Ms. Wigle family history includes Breast cancer in her mother, sister, and sister; Dementia in her father; Diabetes in her paternal grandfather and sister; Heart disease in her father and sister; Prostate cancer in her father; Stroke in her father. There is no history of Colon cancer.      Objective:    Filed Vitals:   04/15/15 1051  BP: 130/70  Pulse: 66    Physical Exam  well-developed older white female in no acute distress, pleasant blood pressure 130/70 pulse 66 height 5 foot 3 weight 199. HEENT ;nontraumatic normocephalic EOMI PERRLA sclera anicteric, Cardiovascular ;regular rate and rhythm with S1-S2 no murmur or gallop, Pulmonary; clear bilaterally, Abdomen ;obese soft he is quite tender in the left lower quadrant there is no guarding or rebound no palpable mass or hepatosplenomegaly bowel sounds are  present she does have a low midline incisional scar, Rectal ;exam not done, Ext;no clubbing cyanosis or edema skin warm and dry, Neuropsych; mood and affect appropriate     Assessment & Plan:   #1 70 yo female With 4 day history of constant left lower quadrant pain. Etiology is not clear symptoms are most consistent with acute diverticulitis however no diverticuli commented on at recent colonoscopy., Rule out epiploic appendagitis  Ureterolithiasis, or other acute intra-abdominal inflammatory process. #2 history of multiple tubular adenomatous polyps, due for follow-up colonoscopy October 2019 #3 coronary artery disease-on aspirin and Plavix #4 obstructive sleep apnea #5 IBS  Plan; we'll check CBC with differential, BMET, and UA Schedule for CT of the abdomen and pelvis this afternoon Will use oral but no IV contrast given reported allergy to iodine/iohexol Further plans pending results of above   Amy S Esterwood PA-C 04/15/2015   Cc: Rosalita Chessman, DO

## 2015-04-15 NOTE — Patient Instructions (Signed)
Please go to the basement level to have your labs drawn and urine test.  You have been scheduled for a CT scan of the abdomen and pelvis at [facility] ([address]---this is in the same building as Press [facility]).   You are scheduled today at4:00 PM . You should arrive 15 minutes prior to your appointment time for registration. Please follow the written instructions below on the day of your exam:  WARNING: IF YOU ARE ALLERGIC TO IODINE/X-RAY DYE, PLEASE NOTIFY RADIOLOGY IMMEDIATELY AT [phone]! YOU WILL BE GIVEN A 13 HOUR PREMEDICATION PREP.  1) Do not eat or drink anything after Noon (4 hours prior to your test) 2) You have been given 2 bottles of oral contrast to drink. The solution may taste   better if refrigerated, but do NOT add ice or any other liquid to this solution. Shake well before drinking.   Drink 1 bottle of contrast @ 2:00 PM (2 hours prior to your exam)  Drink 1 bottle of contrast @ 3:00 PM (1 hour prior to your exam)  You may take any medications as prescribed with a small amount of water except for the following: Metformin, Glucophage, Glucovance, Avandamet, Riomet, Fortamet, Actoplus Met, Janumet, Glumetza or Metaglip. The above medications must be held the day of the exam AND 48 hours after the exam.  The purpose of you drinking the oral contrast is to aid in the visualization of your intestinal tract. The contrast solution may cause some diarrhea. Before your exam is started, you will be given a small amount of fluid to drink. Depending on your individual set of symptoms, you may also receive an intravenous injection of x-ray contrast/dye. Plan on being at [facility] for 30 minutes or longer, depending on the type of exam you are having performed.  This test typically takes 30-45 minutes to complete.  If you have any questions regarding your exam or if you need to reschedule, you may call the CT department at [phone] between the hours  of 8:00 am and 5:00 pm, Monday-Friday.  ________________________________________________________________________

## 2015-04-15 NOTE — Telephone Encounter (Signed)
Patient c/o LLQ pain that started 3 days ago. It is not improving and is in fact starting to be uncomfortable when she walks. She denies bloody bm, fevers or nausea. She suffers with chronic diarrhea which she controls using Lomotil. She states she is having her normal 1 to 2 bm's daily. No recent records of diverticulitis. Patient is very concerned and will be out of state in a few days. Appointment for today given for 11:00 am.

## 2015-04-20 NOTE — Progress Notes (Signed)
Reviewed and agree with documentation and assessment and plan. K. Veena Michale Emmerich , MD   

## 2015-05-04 DIAGNOSIS — M5441 Lumbago with sciatica, right side: Secondary | ICD-10-CM | POA: Diagnosis not present

## 2015-05-05 ENCOUNTER — Ambulatory Visit (INDEPENDENT_AMBULATORY_CARE_PROVIDER_SITE_OTHER): Payer: Medicare Other | Admitting: Neurology

## 2015-05-05 ENCOUNTER — Encounter: Payer: Self-pay | Admitting: Neurology

## 2015-05-05 VITALS — BP 132/90 | HR 86 | Resp 20 | Ht 63.0 in | Wt 193.0 lb

## 2015-05-05 DIAGNOSIS — G4733 Obstructive sleep apnea (adult) (pediatric): Secondary | ICD-10-CM | POA: Diagnosis not present

## 2015-05-05 DIAGNOSIS — G43009 Migraine without aura, not intractable, without status migrainosus: Secondary | ICD-10-CM | POA: Diagnosis not present

## 2015-05-05 DIAGNOSIS — Z9989 Dependence on other enabling machines and devices: Principal | ICD-10-CM

## 2015-05-05 MED ORDER — ZOLPIDEM TARTRATE 10 MG PO TABS: 10.0000 mg | ORAL_TABLET | Freq: Every evening | ORAL | Status: DC | PRN

## 2015-05-05 NOTE — Patient Instructions (Signed)
Please remember to try to maintain good sleep hygiene, which means: Keep a regular sleep and wake schedule, try not to exercise or have a meal within 2 hours of your bedtime, try to keep your bedroom conducive for sleep, that is, cool and dark, without light distractors such as an illuminated alarm clock, and refrain from watching TV right before sleep or in the middle of the night and do not keep the TV or radio on during the night. Also, try not to use or play on electronic devices at bedtime, such as your cell phone, tablet PC or laptop. If you like to read at bedtime on an electronic device, try to dim the background light as much as possible. Do not eat in the middle of the night.     

## 2015-05-05 NOTE — Addendum Note (Signed)
Addended by: Larey Seat on: 05/05/2015 02:16 PM   Modules accepted: Orders

## 2015-05-05 NOTE — Progress Notes (Signed)
PATIENT: Connie Owens DOB: 1945-10-23  REASON FOR VISIT: follow up- OSA on CPAP, migraines HISTORY FROM: patient  HISTORY OF PRESENT ILLNESS: Connie Owens is a 70 year old female with history of OSA on CPAP and migraines. She returns today for follow-up. She states that her headaches have improved on nortriptyline. She states that she has approximately one headache a week. These headaches, last 30-40 minutes. They normally occur on the left side of the head. Denies photophobia or phonophobia. Denies nausea or vomiting. She states that if she relaxes and tries to rest her headache resolves quickly. She states that she uses the CPAP nightly. 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 She returns today for an evaluation.  HISTORY 04/30/14: Connie Owens is a 70 year old female with history of obstructive sleep Apnea and migraines. She returns today for a 90 day compliance download. She brought her machine with her today and the reports shows an AHI of 0.3at 6 cm of water, uses her machine for 5 hours and 57 minutes a night, with 100 % compliance. She uses her machine for greater than 4 hours 74 out of 90 days with compliance of 82%. Her Epworth score is 7 points was previously 4 points. Her fatigue severity score is 41 was previously 53.. Patient reports that she gets about 9 hours of sleep a night. She goes to bed around 11PM and arises at 8:15AM. She denies having trouble falling asleep or staying a sleep. States that the she gets up about 1-2 times a night to urinate. Overall patient feels that CPAP has improved her sleepiness and fatigue. The patient states that her migraines have improved. She continues to take nortriptyline 30 mg at bedtime. She has approximately 1-2 headaches per week. She will occassionally have sharp pain that is on the left side of the head that last for seconds and then will resolve. Overall she feels that her  headaches have improved. Patient continues to suffer from anxiety and depression due to the loss of her husband. They were married 48 years.  Interval history from 05/05/2015 Connie Owens is here today doing better in terms of her migraine headaches without aura and she seems to tolerate amitriptyline very well, but she does have morning dry mouth and she has a history of glaucoma which needs to be considered. In addition we are here to have a compliance visit for CPAP and her overall compliance is 87% but she had several days was less than 4 hours of nightly use. She still needs Ambien , now 18 month after the death of her spouse, this may helped with headache.   REVIEW OF SYSTEMS: Out of a complete 14 system review of symptoms, the patient complains only of the following symptoms, and all other reviewed systems are negative.  ALLERGIES: Allergies  Allergen Reactions  . Iohexol     rash  . Iodine   . Tape     Plastic tape  . Imdur [Isosorbide]     Severe headaches  . Latex Rash  . Verapamil     REACTION: dizziness    HOME MEDICATIONS: Outpatient Prescriptions Prior to Visit  Medication Sig Dispense Refill  . aspirin 81 MG tablet Take 81 mg by mouth daily.    . brimonidine-timolol (COMBIGAN) 0.2-0.5 % ophthalmic solution Apply to eye.    . carvedilol (COREG) 3.125 MG tablet Take 1 tablet (3.125 mg total) by mouth  2 (two) times daily. 180 tablet 1  . carvedilol (COREG) 6.25 MG tablet Take 1 tablet (6.25 mg total) by mouth 2 (two) times daily. 180 tablet 1  . cholecalciferol (VITAMIN D) 1000 UNITS tablet Take 2,000 Units by mouth daily.    . clobetasol ointment (TEMOVATE) [id number] % Apply 1 application topically 2 (two) times daily. 90 g 3  . clopidogrel (PLAVIX) 75 MG tablet Take 1 tablet (75 mg total) by mouth daily with breakfast. 90 tablet 1  . diphenoxylate-atropine (LOMOTIL) 2.5-0.025 MG per tablet 1 to 2 tablets in the morning, then every 6 hours as needed for diarrhea. 720 tablet 3    . estradiol (ESTRACE) 1 MG tablet Take 5 mg by mouth daily.     [name] gabapentin (NEURONTIN) 100 MG capsule 2 po tid for pain 540 capsule 3  . losartan (COZAAR) 50 MG tablet Take 1 tablet (50 mg total) by mouth daily. 1 by mouth once daily 90 tablet 1  . nitroGLYCERIN (NITROSTAT) 0.4 MG SL tablet Place 1 tablet (0.4 mg total) under the tongue every 5 (five) minutes x 3 doses as needed for chest pain. 25 tablet 5  . nortriptyline (PAMELOR) 10 MG capsule Take 4 capsules (40 mg total) by mouth at bedtime. 360 capsule 3  . predniSONE 5 MG/5ML solution Take by mouth daily with breakfast. Used as EYE DROPS 1%    . rosuvastatin (CRESTOR) 20 MG tablet Take 1 tablet (20 mg total) by mouth daily. 90 tablet 3  . zolpidem (AMBIEN) 10 MG tablet Take 1 tablet (10 mg total) by mouth at bedtime. 90 tablet 1  . ciprofloxacin (CIPRO) 500 MG tablet Take 1 tablet (500 mg total) by mouth 2 (two) times daily. 28 tablet 0  . metroNIDAZOLE (FLAGYL) 500 MG tablet Take 1 tablet (500 mg total) by mouth 2 (two) times daily. No alcohol while taking this medication 28 tablet 0   No facility-administered medications prior to visit.    PAST MEDICAL HISTORY: Past Medical History  Diagnosis Date  . CAD (coronary artery disease)     NSTEMI 8/13 => LHC 09/27/11: oLM 50%, dLM 30%, oLAD 60-70%, pLAD 50%, mLAD 70%, pD1 30%, pD2 40%, oRCA 25%, mRCA 40%, EF 50% with ant HK => LAD not amenable to PCI => med Rx  unless recurrent angina = > consider CABG  . Ischemic cardiomyopathy     a.  Echo 09/29/11: EF 30-35% with inferior, posterior, lateral AK, anterior severe HK, mild LVH, mild MR, PASP 40;  => b. follow up echo 10/13:  EF 55-60%, Gr 1 diast dysfn, mild MR  . Corneal disorder   . Postmenopausal status   . Back pain   . Dysmetabolic syndrome X   . Arthritis     osteo...right knee  . Carpal tunnel syndrome   . GERD (gastroesophageal reflux disease)   . Diverticular disease   . Hx of adenomatous colonic polyps   . Hernia      hiatal  . Anxiety state     NOS  . Hypertension     essen. NOS  . Shoulder fracture   . HLD (hyperlipidemia)   . Migraine headache without aura 07/23/2012     Left sided  Connie Owens thought to be migrainous  , hypersensitivity to touch , photophobia ,  no nausea.   . Headache, paroxysmal hemicrania, chronic 07/23/2012     Referral to BOTOX evaluation with dr Rexene Alberts or Krista Blue .  [name] Heart attack ([place]) 09/2011  . Glaucoma (  increased eye pressure) 07/30/2013    Left eye, status post cornea transplants, , has tear duct plug.     FAMILY HISTORY: Family History  Problem Relation Age of Onset  . Breast Owens Mother   . Heart disease Father   . Stroke Father   . Dementia Father   . Prostate Owens Father   . Diabetes Sister     3 sisters   . Heart disease Sister     1 sister CABG  . Breast Owens Sister     PTE pre & post Dx of ca  . Diabetes Paternal Grandfather   . Colon Owens Neg Hx   . Breast Owens Sister     SOCIAL HISTORY: Social History   Social History  . Marital Status: Widowed    Spouse Name: Connie Owens  . Number of Children: 3  . Years of Education: 14   Occupational History  . retired   .     Social History Main Topics  . Smoking status: Never Smoker   . Smokeless tobacco: Never Used  . Alcohol Use: Yes     Comment: rare  . Drug Use: No  . Sexual Activity: Not on file   Other Topics Concern  . Not on file   Social History Narrative   Patient is widowed,([place] past 10/02/13) and lives at home with her husband.   Patient has three adult children.   Patient has a college education.   Patient is right-handed.   Patient drinks two cups of coffee daily.   Regular exercise   Patient is retired.      PHYSICAL EXAM  Filed Vitals:   05/05/15 1327  BP: 132/90  Pulse: 86  Resp: 20  Height: 5\' 3"  (1.6 m)  Weight: 193 lb (87.544 kg)   Body mass index is 34.2 kg/(m^2).  Generalized: Well developed, in no acute distress  Neck ; 15.5 , mallompati  3  Neurological examination  Mentation: Alert oriented to time, place, history taking. Follows all commands speech and language fluent Cranial nerve : No loss of smell or taste.  Pupils were equal round reactive to light. Extraocular movements were full, visual field were full on confrontational test. Facial sensation and strength were normal. Uvula tongue midline. Head turning and shoulder shrug  were normal and symmetric. Motor: The motor testing reveals 5 /5 strength of all 4 extremities. Good symmetric motor tone is noted throughout.  Sensory: Sensory testing is intact to soft touch on all 4 extremities. No evidence of extinction is noted.  Coordination: Cerebellar testing reveals good finger-nose-finger and heel-to-shin bilaterally.  Gait and station: Gait is normal.  Reflexes: Deep tendon reflexes are symmetric and normal bilaterally.   DIAGNOSTIC DATA (LABS, IMAGING, TESTING) - I reviewed patient records, labs, notes, testing and imaging myself where available.  ASSESSMENT AND PLAN 70 y.o. year old female here with:  1. Migraine headache- these did not respond well to Botox, but seemed to have improved under the use of amitriptyline. She can continue using this medication a less she would develop glaucoma, a very dry mouth, or forgetfulness. 2. OSA on CPAP, Connie Owens is using CPAP at 6 cm water pressure with 3 cm EPR she has an 87% compliance for 26 out of 30 days of use but only 63% for over 4 hours. This may be seasonally different, and most of the difficulties seem to be on Thursday night. Her fatigue severity scale today was endorsed at 45 points and her Epworth sleepiness  score at 12 points is slightly elevated. The geriatric depression score was endorsed at only 1..  Caveat the patient is diagnosed with glaucoma and followed by her local ophthalmologist.  Overall the patient is doing well. She will continue on nortriptyline 40 mg at bedtime. Patient had a recent EKG that was  normal. She will continue on Ambien nightly. She was advised to continue using the CPAP nightly. We will do a compliance download at the next visit. She will follow-up in 6 months with Ward Givens  NP  or sooner if needed.   05/05/2015, 1:57 PM [facility] [address] [place], [place] [address] [phone]

## 2015-05-10 ENCOUNTER — Encounter: Payer: Self-pay | Admitting: *Deleted

## 2015-05-12 ENCOUNTER — Other Ambulatory Visit: Payer: Self-pay

## 2015-05-12 DIAGNOSIS — Z1231 Encounter for screening mammogram for malignant neoplasm of breast: Secondary | ICD-10-CM

## 2015-05-19 DIAGNOSIS — M79674 Pain in right toe(s): Secondary | ICD-10-CM | POA: Diagnosis not present

## 2015-05-19 DIAGNOSIS — M7989 Other specified soft tissue disorders: Secondary | ICD-10-CM | POA: Diagnosis not present

## 2015-05-19 DIAGNOSIS — S93504A Unspecified sprain of right lesser toe(s), initial encounter: Secondary | ICD-10-CM | POA: Diagnosis not present

## 2015-05-27 ENCOUNTER — Ambulatory Visit: Payer: Medicare Other

## 2015-06-14 ENCOUNTER — Telehealth: Payer: Self-pay | Admitting: Cardiology

## 2015-06-14 DIAGNOSIS — I214 Non-ST elevation (NSTEMI) myocardial infarction: Secondary | ICD-10-CM

## 2015-06-14 MED ORDER — CARVEDILOL 6.25 MG PO TABS: 6.2500 mg | ORAL_TABLET | Freq: Two times a day (BID) | ORAL | Status: DC

## 2015-06-14 MED ORDER — LOSARTAN POTASSIUM 50 MG PO TABS: 50.0000 mg | ORAL_TABLET | Freq: Every day | ORAL | Status: DC

## 2015-06-14 MED ORDER — CARVEDILOL 3.125 MG PO TABS: 3.1250 mg | ORAL_TABLET | Freq: Two times a day (BID) | ORAL | Status: DC

## 2015-06-14 MED ORDER — CLOPIDOGREL BISULFATE 75 MG PO TABS: 75.0000 mg | ORAL_TABLET | Freq: Every day | ORAL | Status: DC

## 2015-06-14 NOTE — Telephone Encounter (Signed)
Refills sent

## 2015-06-14 NOTE — Telephone Encounter (Signed)
New message       *STAT* If patient is at the pharmacy, call can be transferred to refill team.   1. Which medications need to be refilled? (please list name of each medication and dose if known)  Carvedilol 6.25 and 3.125, generic plavix 75mg , losartan 50mg    2. Which pharmacy/location (including street and city if local pharmacy) is medication to be sent to? [place] 3. Do they need a 30 day or 90 day supply? 90 day supply

## 2015-06-16 ENCOUNTER — Ambulatory Visit
Admission: RE | Admit: 2015-06-16 | Discharge: 2015-06-16 | Disposition: A | Discharge status: Home / Self Care | Payer: Medicare Other | Source: Ambulatory Visit

## 2015-06-16 DIAGNOSIS — Z1231 Encounter for screening mammogram for malignant neoplasm of breast: Secondary | ICD-10-CM

## 2015-07-01 ENCOUNTER — Other Ambulatory Visit: Payer: Self-pay | Admitting: Neurology

## 2015-07-01 MED ORDER — NORTRIPTYLINE HCL 10 MG PO CAPS: 40.0000 mg | ORAL_CAPSULE | Freq: Every day | ORAL | Status: DC

## 2015-07-01 NOTE — Telephone Encounter (Signed)
Spoke to pt and advised her that pamelor was refilled and sent to the [facility]. Pt verbalized understanding and appreciation.

## 2015-07-01 NOTE — Telephone Encounter (Signed)
Patient requesting refill of nortriptyline (PAMELOR) 10 MG capsule Pharmacy:[facility] - [place], [place] - [address]  90 day supply

## 2015-09-01 ENCOUNTER — Telehealth: Payer: Self-pay | Admitting: Neurology

## 2015-09-01 NOTE — Telephone Encounter (Signed)
I spoke to pt. She says that she is taking excedrin migraine for daily headaches but it is not working. She is compliant with her nortriptyline. I offered her a sooner appointment with Hoyle Sauer, NP for tomorrow and she accepted. Pt verbalized understanding to arrive by 9:00 for a 9:15 appt.

## 2015-09-01 NOTE — Telephone Encounter (Signed)
Pt called sts the HA's are daily constant HA that is effecting her quality of life. She takes OTC med every 3 hrs but that irritates the stomach. Appt was scheduled with Megan for 8/15. Pt is requesting to be seen sooner with either Dr D or Jinny Blossom. Please call

## 2015-09-02 ENCOUNTER — Encounter: Payer: Self-pay | Admitting: Nurse Practitioner

## 2015-09-02 ENCOUNTER — Ambulatory Visit (INDEPENDENT_AMBULATORY_CARE_PROVIDER_SITE_OTHER): Payer: Medicare Other | Admitting: Nurse Practitioner

## 2015-09-02 VITALS — BP 112/82 | HR 64 | Ht 63.0 in | Wt 200.2 lb

## 2015-09-02 DIAGNOSIS — I10 Essential (primary) hypertension: Secondary | ICD-10-CM

## 2015-09-02 DIAGNOSIS — Z9989 Dependence on other enabling machines and devices: Secondary | ICD-10-CM

## 2015-09-02 DIAGNOSIS — G4733 Obstructive sleep apnea (adult) (pediatric): Secondary | ICD-10-CM

## 2015-09-02 DIAGNOSIS — G43009 Migraine without aura, not intractable, without status migrainosus: Secondary | ICD-10-CM | POA: Diagnosis not present

## 2015-09-02 MED ORDER — NORTRIPTYLINE HCL 10 MG PO CAPS: 50.0000 mg | ORAL_CAPSULE | Freq: Every day | ORAL | Status: DC

## 2015-09-02 NOTE — Progress Notes (Signed)
[facility]  PATIENT: Connie Owens DOB: 08/10/1945   REASON FOR VISIT: Follow-up for migraine HISTORY FROM: Patient Connie Owens is a 70 year old female with history of OSA on CPAP and migraines. She returns today for follow-up. She states that her headaches have improved on nortriptyline. She states that she has approximately one headache a week. These headaches, last 30-40 minutes. They normally occur on the left side of the head. Denies photophobia or phonophobia. Denies nausea or vomiting. She states that if she relaxes and tries to rest her headache resolves quickly. She states that she uses the CPAP nightly. 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 She returns today for an evaluation.  HISTORY 04/30/14: Connie Owens is a 70 year old female with history of obstructive sleep Apnea and migraines. She returns today for a 90 day compliance download. She brought her machine with her today and the reports shows an AHI of 0.3at 6 cm of water, uses her machine for 5 hours and 57 minutes a night, with 100 % compliance. She uses her machine for greater than 4 hours 74 out of 90 days with compliance of 82%. Her Epworth score is 7 points was previously 4 points. Her fatigue severity score is 41 was previously 53.. Patient reports that she gets about 9 hours of sleep a night. She goes to bed around 11PM and arises at 8:15AM. She denies having trouble falling asleep or staying a sleep. States that the she gets up about 1-2 times a night to urinate. Overall patient feels that CPAP has improved her sleepiness and fatigue. The patient states that her migraines have improved. She continues to take nortriptyline 30 mg at bedtime. She has approximately 1-2 headaches per week. She will occassionally have sharp pain that is on the left side of the head that last for seconds and then will resolve. Overall she feels that her headaches have  improved. Patient continues to suffer from anxiety and depression due to the loss of her husband. They were married 75 years.  Interval history from 03/22/2017CD Connie Owens is here today doing better in terms of her migraine headaches without aura and she seems to tolerate amitriptyline very well, but she does have morning dry mouth and she has a history of glaucoma which needs to be considered. In addition we are here to have a compliance visit for CPAP and her overall compliance is 87% but she had several days was less than 4 hours of nightly use. She still needs Ambien , now 18 month after the death of her spouse, this may helped with headache. 09/02/15 CM Connie Owens, 70 year old female returns for follow-up. She is here because her migraines have worsened. She has been taking Excedrin Migraine on a frequent basis. She says that heat  is a trigger for her headaches and we are having 90 days . Her preventive  is nortriptyline 40 mg at night. In addition she has obstructive apnea and uses CPAP approximately 4 hours at night. Her headache today on a pain scale of 1-10 is a 3-4. She is not aware of foods that are specific  Triggers. Headaches 2-3 times a week lasting several hours .She returns for reevaluation  REVIEW OF SYSTEMS: Full 14 system review of systems performed and notable only for those listed, all others are neg:  Constitutional: neg  Cardiovascular: neg Ear/Nose/Throat: neg  Skin: neg Eyes: Light sensitivity Respiratory: neg  Gastroitestinal: neg  Hematology/Lymphatic: neg  Endocrine: neg Musculoskeletal:neg Allergy/Immunology: neg Neurological: Headaches Psychiatric: neg Sleep : Obstructive sleep apnea on CPAP   ALLERGIES: Allergies  Allergen Reactions  . Iohexol     rash  . Iodine   . Tape     Plastic tape  . Imdur [Isosorbide Nitrate]     Severe headaches  . Latex Rash  . Verapamil     REACTION: dizziness    HOME MEDICATIONS: Outpatient Prescriptions Prior to  Visit  Medication Sig Dispense Refill  . aspirin 81 MG tablet Take 81 mg by mouth daily.    . brimonidine-timolol (COMBIGAN) 0.2-0.5 % ophthalmic solution Apply to eye.    . carvedilol (COREG) 3.125 MG tablet Take 1 tablet (3.125 mg total) by mouth 2 (two) times daily. 180 tablet 0  . carvedilol (COREG) 6.25 MG tablet Take 1 tablet (6.25 mg total) by mouth 2 (two) times daily. Needs appt. 180 tablet 0  . clobetasol ointment (TEMOVATE) AB-[id number] % Apply 1 application topically 2 (two) times daily. 90 g 3  . clopidogrel (PLAVIX) 75 MG tablet Take 1 tablet (75 mg total) by mouth daily with breakfast. Needs appt. 90 tablet 0  . diphenoxylate-atropine (LOMOTIL) 2.5-0.025 MG per tablet 1 to 2 tablets in the morning, then every 6 hours as needed for diarrhea. 720 tablet 3  . estradiol (ESTRACE) 1 MG tablet Take 5 mg by mouth daily.     [name] gabapentin (NEURONTIN) 100 MG capsule 2 po tid for pain 540 capsule 3  . losartan (COZAAR) 50 MG tablet Take 1 tablet (50 mg total) by mouth daily. Needs appt. 90 tablet 0  . nitroGLYCERIN (NITROSTAT) 0.4 MG SL tablet Place 1 tablet (0.4 mg total) under the tongue every 5 (five) minutes x 3 doses as needed for chest pain. 25 tablet 5  . nortriptyline (PAMELOR) 10 MG capsule Take 4 capsules (40 mg total) by mouth at bedtime. 360 capsule 3  . predniSONE 5 MG/5ML solution Take by mouth daily with breakfast. Used as EYE DROPS 1%    . zolpidem (AMBIEN) 10 MG tablet Take 1 tablet (10 mg total) by mouth at bedtime as needed for sleep. 90 tablet 3  . cholecalciferol (VITAMIN D) 1000 UNITS tablet Take 2,000 Units by mouth daily. Reported on 09/02/2015    . rosuvastatin (CRESTOR) 20 MG tablet Take 1 tablet (20 mg total) by mouth daily. (Patient not taking: Reported on 09/02/2015) 90 tablet 3   No facility-administered medications prior to visit.    PAST MEDICAL HISTORY: Past Medical History  Diagnosis Date  . CAD (coronary artery disease)     NSTEMI 8/13 => LHC 09/27/11: oLM 50%,  dLM 30%, oLAD 60-70%, pLAD 50%, mLAD 70%, pD1 30%, pD2 40%, oRCA 25%, mRCA 40%, EF 50% with ant HK => LAD not amenable to PCI => med Rx  unless recurrent angina = > consider CABG  . Ischemic cardiomyopathy     a.  Echo 09/29/11: EF 30-35% with inferior, posterior, lateral AK, anterior severe HK, mild LVH, mild MR, PASP 40;  => b. follow up echo 10/13:  EF 55-60%, Gr 1 diast dysfn, mild MR  . Corneal disorder   . Postmenopausal status   . Back pain   . Dysmetabolic syndrome X   . Arthritis     osteo...right knee  . Carpal tunnel syndrome   . GERD (gastroesophageal reflux disease)   . Diverticular disease   . Hx of adenomatous colonic polyps   . Hernia  hiatal  . Anxiety state     NOS  . Hypertension     essen. NOS  . Shoulder fracture   . HLD (hyperlipidemia)   . Migraine headache without aura 07/23/2012     Left sided  Lamonte Sakai thought to be migrainous  , hypersensitivity to touch , photophobia ,  no nausea.   . Headache, paroxysmal hemicrania, chronic 07/23/2012     Referral to BOTOX evaluation with dr Rexene Alberts or Krista Blue .  [name] Heart attack ([place]) 09/2011  . 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     PAST SURGICAL HISTORY: Past Surgical History  Procedure Laterality Date  . Appendectomy    . Cholecystectomy    . Abdominal hysterectomy    . Breast lumpectomy Right 1996  . Colonoscopy w/ polypectomy    . Lumbar spur removed    . Total knee arthroplasty Right   . Bunionectomy Right   . Ankle cystectomy Right   . Rectocele surgery    . 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 Hemorrhoid surgery  12/01/2010, 2014    [place] hemorrhoid ligation/pexy  . Cardiac catheterization  09/28/2011    Dr Acie Fredrickson(  to see Dr Ron Parker)  . 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 Corneal transplant Left     x1  . Left heart catheterization with coronary angiogram N/A 09/28/2011    Procedure: LEFT HEART CATHETERIZATION WITH CORONARY  ANGIOGRAM;  Surgeon: Minus Breeding, MD;  Location: [facility] CATH LAB;  Service: Cardiovascular;  Laterality: N/A;    FAMILY HISTORY: Family History  Problem Relation Age of Onset  . Breast cancer Mother   . Heart disease Father   . Stroke Father   . Dementia Father   . Prostate cancer Father   . Diabetes Sister     3 sisters   . Heart disease Sister     1 sister CABG  . Breast cancer Sister     PTE pre & post Dx of ca  . Diabetes Paternal Grandfather   . Colon cancer Neg Hx   . Breast cancer Sister     SOCIAL HISTORY: Social History   Social History  . Marital Status: Widowed    Spouse Name: Ruthann Cancer  . Number of Children: 3  . Years of Education: 14   Occupational History  . retired   .     Social History Main Topics  . Smoking status: Never Smoker   . Smokeless tobacco: Never Used  . Alcohol Use: Yes     Comment: rare  . Drug Use: No  . Sexual Activity: Not on file   Other Topics Concern  . Not on file   Social History Narrative   Patient is widowed,([place] past 10/02/13) and lives at home with her husband.   Patient has three adult children.   Patient has a college education.   Patient is right-handed.   Patient drinks two cups of coffee daily.   Regular exercise   Patient is retired.     PHYSICAL EXAM  Filed Vitals:   09/02/15 0913  Height: 5\' 3"  (1.6 m)  Weight: 200 lb 3.2 oz (90.81 kg)   Body mass index is 35.47 kg/(m^2). Generalized: Well developed, in no acute distress Obese female Neurological examination  Mentation: Alert oriented to time, place, history taking. Follows all commands speech and language fluent Cranial nerve : Pupils were equal round reactive to light.  Extraocular movements were full, visual field were full on confrontational test. Facial sensation and strength were normal. Uvula tongue midline. Head turning and shoulder shrug were normal and symmetric. Motor: The motor testing reveals 5 /5 strength of all 4 extremities.  Good symmetric motor tone is noted throughout.  Sensory: Sensory testing is intact to soft touch on all 4 extremities. No evidence of extinction is noted.  Coordination: Cerebellar testing reveals good finger-nose-finger and heel-to-shin bilaterally.  Gait and station: Gait is normal.  Reflexes: Deep tendon reflexes are symmetric and normal bilaterally.  , DIAGNOSTIC DATA (LABS, IMAGING, TESTING) - I reviewed patient records, labs, notes, testing and imaging myself where available.  Lab Results  Component Value Date   WBC 10.9* 04/15/2015   HGB 13.2 04/15/2015   HCT 40.1 04/15/2015   MCV 85.5 04/15/2015   PLT 322.0 04/15/2015      Component Value Date/Time   NA 135 04/15/2015 1155   K 4.6 04/15/2015 1155   CL 102 04/15/2015 1155   CO2 28 04/15/2015 1155   GLUCOSE 121* 04/15/2015 1155   BUN 10 04/15/2015 1155   CREATININE 0.76 04/15/2015 1155   CREATININE 0.87 11/17/2014 0844   CALCIUM 9.6 04/15/2015 1155   PROT 6.6 11/17/2014 0844   ALBUMIN 3.9 11/17/2014 0844   AST 14 11/17/2014 0844   ALT 12 11/17/2014 0844   ALKPHOS 54 11/17/2014 0844   BILITOT 0.4 11/17/2014 0844   GFRNONAA 68 11/17/2014 0844   GFRNONAA 86* 09/30/2011 0610   GFRAA 79 11/17/2014 0844   GFRAA >90 09/30/2011 0610   Lab Results  Component Value Date   CHOL 185 11/17/2014   HDL 49.40 11/17/2014   LDLCALC 101* 11/17/2014   LDLDIRECT 140.8 03/18/2013   TRIG 173.0* 11/17/2014   CHOLHDL 4 11/17/2014       ASSESSMENT AND PLAN  70 y.o. year old female  has a past medical history of CAD (coronary artery disease); Ischemic cardiomyopathy; Corneal disorder; Dysmetabolic syndrome X; Arthritis; Carpal tunnel syndrome;  Diverticular disease;  Anxiety state; Hypertension;  HLD (hyperlipidemia); Migraine headache without aura (07/23/2012); Headache, paroxysmal hemicrania, chronic (07/23/2012); Heart attack ([place]) (09/2011); and Glaucoma (increased eye pressure) (07/30/2013). here To follow-up for her migraine  headache which is increased. It is a 3 out of 4 on a scale of 1-10  PLAN: Increase nortriptyline to 5 capsules at night Given information on food triggers, eliminate one at a time Use CPAP every night Stop Excedrin Migraine Given information on rebound headache Keep Follow-up with Jinny Blossom  in one month Dennie Bible, [facility], [facility], [facility] [address], [place], [place] [address] [phone]

## 2015-09-02 NOTE — Progress Notes (Signed)
I agree with the assessment and plan as directed by NP .The patient is known to me .   Hadlei Stitt, MD  

## 2015-09-02 NOTE — Patient Instructions (Signed)
Increase nortriptyline to 5 capsules at night Given information on food triggers Use CPAP every night Stop Excedrin Migraine Given information on rebound headache Keep Follow-up with Jinny Blossom  in one month

## 2015-09-28 ENCOUNTER — Ambulatory Visit: Payer: Medicare Other | Admitting: Adult Health

## 2015-09-28 ENCOUNTER — Telehealth: Payer: Self-pay

## 2015-09-28 NOTE — Telephone Encounter (Signed)
Pt no-showed today's appt. 

## 2015-09-29 ENCOUNTER — Encounter: Payer: Self-pay | Admitting: Adult Health

## 2015-10-04 NOTE — Telephone Encounter (Signed)
Pt called said she c/a the appt 8/15 via automated system.  It shows the automated system c/a the appt on 8/19?? She does not want this to go against her Please call

## 2015-10-05 NOTE — Telephone Encounter (Signed)
Spoke to patient - states she was told the incorrect date. Says she would never not show up for an appt and wanted this mix-up documented in her chart.

## 2015-10-19 ENCOUNTER — Other Ambulatory Visit: Payer: Self-pay | Admitting: *Deleted

## 2015-10-19 DIAGNOSIS — I214 Non-ST elevation (NSTEMI) myocardial infarction: Secondary | ICD-10-CM

## 2015-10-19 MED ORDER — CARVEDILOL 6.25 MG PO TABS: 6.2500 mg | ORAL_TABLET | Freq: Two times a day (BID) | ORAL | 0 refills | Status: DC

## 2015-10-19 MED ORDER — CARVEDILOL 3.125 MG PO TABS: 3.1250 mg | ORAL_TABLET | Freq: Two times a day (BID) | ORAL | 0 refills | Status: DC

## 2015-10-19 MED ORDER — LOSARTAN POTASSIUM 50 MG PO TABS
50.0000 mg | ORAL_TABLET | Freq: Every day | ORAL | 0 refills | Status: DC
Start: 2015-10-19 — End: 2015-11-30

## 2015-10-19 MED ORDER — CLOPIDOGREL BISULFATE 75 MG PO TABS: 75.0000 mg | ORAL_TABLET | Freq: Every day | ORAL | 0 refills | Status: DC

## 2015-10-19 NOTE — Telephone Encounter (Signed)
Rx(s) sent to pharmacy electronically.  

## 2015-10-29 DIAGNOSIS — Z471 Aftercare following joint replacement surgery: Secondary | ICD-10-CM | POA: Diagnosis not present

## 2015-10-29 DIAGNOSIS — M1712 Unilateral primary osteoarthritis, left knee: Secondary | ICD-10-CM | POA: Diagnosis not present

## 2015-11-17 DIAGNOSIS — H1851 Endothelial corneal dystrophy: Secondary | ICD-10-CM | POA: Diagnosis not present

## 2015-11-17 DIAGNOSIS — H0289 Other specified disorders of eyelid: Secondary | ICD-10-CM | POA: Diagnosis not present

## 2015-11-17 DIAGNOSIS — H4062X2 Glaucoma secondary to drugs, left eye, moderate stage: Secondary | ICD-10-CM | POA: Diagnosis not present

## 2015-11-29 DIAGNOSIS — M1712 Unilateral primary osteoarthritis, left knee: Secondary | ICD-10-CM | POA: Diagnosis not present

## 2015-11-29 NOTE — Progress Notes (Signed)
HPI The patient presents for followup of her cardiomyopathy and coronary disease that we are managing medically. She had a non-Q-wave myocardial infarction in the past.  At that time her ejection fraction by echo was 30-35% though it was higher by cath. Follow up echo demonstrated her EF to be 60-65% in 2013.  She presents for follow-up. She has been doing well except that her BP has been running low.  She does feel light headed at times with this.  .The patient denies any new symptoms such as chest discomfort, neck or arm discomfort. There has been no new shortness of breath, PND or orthopnea. There have been no reported palpitations or syncope.  She is doing water aerobics.     Allergies  Allergen Reactions  . Iohexol     rash  . Iodine   . Tape     Plastic tape  . Imdur [Isosorbide Nitrate]     Severe headaches  . Latex Rash  . Verapamil     REACTION: dizziness    Current Outpatient Prescriptions  Medication Sig Dispense Refill  . aspirin 81 MG tablet Take 81 mg by mouth daily.    . brimonidine-timolol (COMBIGAN) 0.2-0.5 % ophthalmic solution Apply to eye. 2 gtts L eye, 1 gtt R eye.    . carvedilol (COREG) 3.125 MG tablet Take 1 tablet (3.125 mg total) by mouth 2 (two) times daily. 180 tablet 0  . carvedilol (COREG) 6.25 MG tablet Take 1 tablet (6.25 mg total) by mouth 2 (two) times daily. Needs appt. 180 tablet 0  . clobetasol ointment (TEMOVATE) AB-123456789 % Apply 1 application topically 2 (two) times daily. 90 g 3  . clopidogrel (PLAVIX) 75 MG tablet Take 1 tablet (75 mg total) by mouth daily with breakfast. Needs appt. 90 tablet 0  . diphenoxylate-atropine (LOMOTIL) 2.5-0.025 MG per tablet 1 to 2 tablets in the morning, then every 6 hours as needed for diarrhea. 720 tablet 3  . estradiol (ESTRACE) 1 MG tablet Take 5 mg by mouth daily.     [name] gabapentin (NEURONTIN) 100 MG capsule 2 po tid for pain 540 capsule 3  . losartan (COZAAR) 50 MG tablet Take 1 tablet (50 mg total) by mouth  daily. Needs appt. 90 tablet 0  . nitroGLYCERIN (NITROSTAT) 0.4 MG SL tablet Place 1 tablet (0.4 mg total) under the tongue every 5 (five) minutes x 3 doses as needed for chest pain. 25 tablet 5  . nortriptyline (PAMELOR) 10 MG capsule Take 5 capsules (50 mg total) by mouth at bedtime. 450 capsule 1  . predniSONE 5 MG/5ML solution Take by mouth daily with breakfast. Used as EYE DROPS 1%    . zolpidem (AMBIEN) 10 MG tablet Take 1 tablet (10 mg total) by mouth at bedtime as needed for sleep. 90 tablet 3   No current facility-administered medications for this visit.     Past Medical History:  Diagnosis Date  . Anxiety state    NOS  . Arthritis    osteo...right knee  . Back pain   . CAD (coronary artery disease)    NSTEMI 8/13 => LHC 09/27/11: oLM 50%, dLM 30%, oLAD 60-70%, pLAD 50%, mLAD 70%, pD1 30%, pD2 40%, oRCA 25%, mRCA 40%, EF 50% with ant HK => LAD not amenable to PCI => med Rx  unless recurrent angina = > consider CABG  . Carpal tunnel syndrome   . Corneal disorder   . Diverticular disease   . Dysmetabolic syndrome X   .  GERD (gastroesophageal reflux disease)   . 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   [name] Headache, paroxysmal hemicrania, chronic 07/23/2012    Referral to BOTOX evaluation with dr Rexene Alberts or Krista Blue .  [name] Heart attack 09/2011  . Hernia    hiatal  . HLD (hyperlipidemia)   . Hx of adenomatous colonic polyps   . Hypertension    essen. NOS  . Ischemic cardiomyopathy    a.  Echo 09/29/11: EF 30-35% with inferior, posterior, lateral AK, anterior severe HK, mild LVH, mild MR, PASP 40;  => b. follow up echo 10/13:  EF 55-60%, Gr 1 diast dysfn, mild MR  . Migraine headache without aura 07/23/2012    Left sided  Lamonte Sakai thought to be migrainous  , hypersensitivity to touch , photophobia ,  no nausea.   [name] Postmenopausal status   . Shoulder fracture     Past Surgical History:  Procedure Laterality Date  .  ABDOMINAL HYSTERECTOMY    . ankle cystectomy Right   . APPENDECTOMY    . BREAST LUMPECTOMY Right 1996  . BUNIONECTOMY Right   . CARDIAC CATHETERIZATION  09/28/2011   Dr Acie Fredrickson(  to see Dr Ron Parker)  . CHOLECYSTECTOMY    . COLONOSCOPY W/ POLYPECTOMY    . 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 CORNEAL TRANSPLANT Left    x1  . HEMORRHOID SURGERY  12/01/2010, 2014   [place] hemorrhoid ligation/pexy  . LEFT HEART CATHETERIZATION WITH CORONARY ANGIOGRAM N/A 09/28/2011   Procedure: LEFT HEART CATHETERIZATION WITH CORONARY ANGIOGRAM;  Surgeon: Minus Breeding, MD;  Location: [facility] CATH LAB;  Service: Cardiovascular;  Laterality: N/A;  . lumbar spur removed    . rectocele surgery    . TOTAL KNEE ARTHROPLASTY Right     ROS:   As stated in the HPI and negative for all other systems.  PHYSICAL EXAM BP (!) 154/87 (BP Location: Left Arm, Patient Position: Sitting, Cuff Size: Large)   Pulse (!) 58   Ht 5\' 3"  (1.6 m)   Wt 203 lb 6 oz (92.3 kg)   BMI 36.03 kg/m  GENERAL:  Well appearing HEENT:  Pupils equal round and reactive, fundi not visualized, oral mucosa unremarkable NECK:  No jugular venous distention, waveform within normal limits, carotid upstroke brisk and symmetric, no bruits, no thyromegaly LUNGS:  Clear to auscultation bilaterally CHEST:  Unremarkable HEART:  PMI not displaced or sustained,S1 and S2 within normal limits, no S3, no S4, no clicks, no rubs, no murmurs ABD:  Flat, positive bowel sounds normal in frequency in pitch, no bruits, no rebound, no guarding, no midline pulsatile mass, no hepatomegaly, no splenomegaly EXT:  2 plus pulses throughout, no edema, no cyanosis no clubbing  EKG:  Sinus rhythm, rate 59, axis within normal limits, intervals within normal limits, no acute ST-T wave changes.11/30/2015  Lab Results  Component Value Date   CHOL 185 11/17/2014   TRIG 173.0 (H) 11/17/2014   HDL 49.40 11/17/2014   LDLCALC 101 (H) 11/17/2014     LDLDIRECT 140.8 03/18/2013    ASSESSMENT AND PLAN  Coronary Artery Disease:  The patient has no new sypmtoms.  No further cardiovascular testing is indicated.  We will continue with aggressive risk reduction and meds as listed.  Ischemic Cardiomyopathy:  She is breathing OK.  No change in therapy is planned.   Hyperlipidemia:  I would like to have  her come back for a lipid profile.   Hypertension:    Her blood pressure is low and she is having symptoms.  I will reduce her Coreg by stopping the 3.125 mg bid dose.

## 2015-11-30 ENCOUNTER — Ambulatory Visit (INDEPENDENT_AMBULATORY_CARE_PROVIDER_SITE_OTHER): Payer: Medicare Other | Admitting: Cardiology

## 2015-11-30 ENCOUNTER — Encounter: Payer: Self-pay | Admitting: Cardiology

## 2015-11-30 VITALS — BP 154/87 | HR 58 | Ht 63.0 in | Wt 203.4 lb

## 2015-11-30 DIAGNOSIS — I10 Essential (primary) hypertension: Secondary | ICD-10-CM | POA: Diagnosis not present

## 2015-11-30 DIAGNOSIS — I214 Non-ST elevation (NSTEMI) myocardial infarction: Secondary | ICD-10-CM

## 2015-11-30 DIAGNOSIS — I255 Ischemic cardiomyopathy: Secondary | ICD-10-CM | POA: Diagnosis not present

## 2015-11-30 MED ORDER — LOSARTAN POTASSIUM 50 MG PO TABS: 50.0000 mg | ORAL_TABLET | Freq: Every day | ORAL | 3 refills | Status: DC

## 2015-11-30 MED ORDER — CARVEDILOL 6.25 MG PO TABS: 6.2500 mg | ORAL_TABLET | Freq: Two times a day (BID) | ORAL | 3 refills | Status: DC

## 2015-11-30 MED ORDER — CLOPIDOGREL BISULFATE 75 MG PO TABS: 75.0000 mg | ORAL_TABLET | Freq: Every day | ORAL | 3 refills | Status: AC

## 2015-11-30 NOTE — Patient Instructions (Signed)
Medication Instructions:  STOP- Carvedilol 3.125  Labwork: None Ordered  Testing/Procedures: None Ordered  Follow-Up: Your physician wants you to follow-up in: 6 Months. You will receive a reminder letter in the mail two months in advance. If you don't receive a letter, please call our office to schedule the follow-up appointment.   Any Other Special Instructions Will Be Listed Below (If Applicable).   If you need a refill on your cardiac medications before your next appointment, please call your pharmacy.

## 2015-12-01 ENCOUNTER — Encounter: Payer: Self-pay | Admitting: Cardiology

## 2015-12-06 DIAGNOSIS — M1712 Unilateral primary osteoarthritis, left knee: Secondary | ICD-10-CM | POA: Diagnosis not present

## 2015-12-13 DIAGNOSIS — M1712 Unilateral primary osteoarthritis, left knee: Secondary | ICD-10-CM | POA: Diagnosis not present

## 2015-12-14 ENCOUNTER — Ambulatory Visit (INDEPENDENT_AMBULATORY_CARE_PROVIDER_SITE_OTHER): Payer: Medicare Other | Admitting: Family Medicine

## 2015-12-14 ENCOUNTER — Encounter: Payer: Self-pay | Admitting: Family Medicine

## 2015-12-14 VITALS — BP 130/78 | HR 61 | Temp 98.3°F | Resp 16 | Ht 63.0 in | Wt 206.0 lb

## 2015-12-14 DIAGNOSIS — L409 Psoriasis, unspecified: Secondary | ICD-10-CM | POA: Diagnosis not present

## 2015-12-14 DIAGNOSIS — Z0000 Encounter for general adult medical examination without abnormal findings: Secondary | ICD-10-CM

## 2015-12-14 DIAGNOSIS — I214 Non-ST elevation (NSTEMI) myocardial infarction: Secondary | ICD-10-CM | POA: Diagnosis not present

## 2015-12-14 DIAGNOSIS — M792 Neuralgia and neuritis, unspecified: Secondary | ICD-10-CM

## 2015-12-14 DIAGNOSIS — R5383 Other fatigue: Secondary | ICD-10-CM

## 2015-12-14 MED ORDER — CLOBETASOL PROPIONATE 0.05 % EX OINT: 1.0000 "application " | TOPICAL_OINTMENT | Freq: Two times a day (BID) | CUTANEOUS | 3 refills | Status: DC

## 2015-12-14 MED ORDER — GABAPENTIN 100 MG PO CAPS: ORAL_CAPSULE | ORAL | 3 refills | Status: DC

## 2015-12-14 NOTE — Patient Instructions (Signed)
Preventive Care for Adults, Female A healthy lifestyle and preventive care can promote health and wellness. Preventive health guidelines for women include the following key practices.  A routine yearly physical is a good way to check with your health care provider about your health and preventive screening. It is a chance to share any concerns and updates on your health and to receive a thorough exam.  Visit your dentist for a routine exam and preventive care every 6 months. Brush your teeth twice a day and floss once a day. Good oral hygiene prevents tooth decay and gum disease.  The frequency of eye exams is based on your age, health, family medical history, use of contact lenses, and other factors. Follow your health care provider's recommendations for frequency of eye exams.  Eat a healthy diet. Foods like vegetables, fruits, whole grains, low-fat dairy products, and lean protein foods contain the nutrients you need without too many calories. Decrease your intake of foods high in solid fats, added sugars, and salt. Eat the right amount of calories for you.Get information about a proper diet from your health care provider, if necessary.  Regular physical exercise is one of the most important things you can do for your health. Most adults should get at least 150 minutes of moderate-intensity exercise (any activity that increases your heart rate and causes you to sweat) each week. In addition, most adults need muscle-strengthening exercises on 2 or more days a week.  Maintain a healthy weight. The body mass index (BMI) is a screening tool to identify possible weight problems. It provides an estimate of body fat based on height and weight. Your health care provider can find your BMI and can help you achieve or maintain a healthy weight.For adults 20 years and older:  A BMI below 18.5 is considered underweight.  A BMI of 18.5 to 24.9 is normal.  A BMI of 25 to 29.9 is considered overweight.  A  BMI of 30 and above is considered obese.  Maintain normal blood lipids and cholesterol levels by exercising and minimizing your intake of saturated fat. Eat a balanced diet with plenty of fruit and vegetables. Blood tests for lipids and cholesterol should begin at age 45 and be repeated every 5 years. If your lipid or cholesterol levels are high, you are over 50, or you are at high risk for heart disease, you may need your cholesterol levels checked more frequently.Ongoing high lipid and cholesterol levels should be treated with medicines if diet and exercise are not working.  If you smoke, find out from your health care provider how to quit. If you do not use tobacco, do not start.  Lung cancer screening is recommended for adults aged 45-80 years who are at high risk for developing lung cancer because of a history of smoking. A yearly low-dose CT scan of the lungs is recommended for people who have at least a 30-pack-year history of smoking and are a current smoker or have quit within the past 15 years. A pack year of smoking is smoking an average of 1 pack of cigarettes a day for 1 year (for example: 1 pack a day for 30 years or 2 packs a day for 15 years). Yearly screening should continue until the smoker has stopped smoking for at least 15 years. Yearly screening should be stopped for people who develop a health problem that would prevent them from having lung cancer treatment.  If you are pregnant, do not drink alcohol. If you are  breastfeeding, be very cautious about drinking alcohol. If you are not pregnant and choose to drink alcohol, do not have more than 1 drink per day. One drink is considered to be 12 ounces (355 mL) of beer, 5 ounces (148 mL) of wine, or 1.5 ounces (44 mL) of liquor.  Avoid use of street drugs. Do not share needles with anyone. Ask for help if you need support or instructions about stopping the use of drugs.  High blood pressure causes heart disease and increases the risk  of stroke. Your blood pressure should be checked at least every 1 to 2 years. Ongoing high blood pressure should be treated with medicines if weight loss and exercise do not work.  If you are 55-79 years old, ask your health care provider if you should take aspirin to prevent strokes.  Diabetes screening is done by taking a blood sample to check your blood glucose level after you have not eaten for a certain period of time (fasting). If you are not overweight and you do not have risk factors for diabetes, you should be screened once every 3 years starting at age 45. If you are overweight or obese and you are 40-70 years of age, you should be screened for diabetes every year as part of your cardiovascular risk assessment.  Breast cancer screening is essential preventive care for women. You should practice "breast self-awareness." This means understanding the normal appearance and feel of your breasts and may include breast self-examination. Any changes detected, no matter how small, should be reported to a health care provider. Women in their 20s and 30s should have a clinical breast exam (CBE) by a health care provider as part of a regular health exam every 1 to 3 years. After age 40, women should have a CBE every year. Starting at age 40, women should consider having a mammogram (breast X-ray test) every year. Women who have a family history of breast cancer should talk to their health care provider about genetic screening. Women at a high risk of breast cancer should talk to their health care providers about having an MRI and a mammogram every year.  Breast cancer gene (BRCA)-related cancer risk assessment is recommended for women who have family members with BRCA-related cancers. BRCA-related cancers include breast, ovarian, tubal, and peritoneal cancers. Having family members with these cancers may be associated with an increased risk for harmful changes (mutations) in the breast cancer genes BRCA1 and  BRCA2. Results of the assessment will determine the need for genetic counseling and BRCA1 and BRCA2 testing.  Your health care provider may recommend that you be screened regularly for cancer of the pelvic organs (ovaries, uterus, and vagina). This screening involves a pelvic examination, including checking for microscopic changes to the surface of your cervix (Pap test). You may be encouraged to have this screening done every 3 years, beginning at age 21.  For women ages 30-65, health care providers may recommend pelvic exams and Pap testing every 3 years, or they may recommend the Pap and pelvic exam, combined with testing for human papilloma virus (HPV), every 5 years. Some types of HPV increase your risk of cervical cancer. Testing for HPV may also be done on women of any age with unclear Pap test results.  Other health care providers may not recommend any screening for nonpregnant women who are considered low risk for pelvic cancer and who do not have symptoms. Ask your health care provider if a screening pelvic exam is right for   you.  If you have had past treatment for cervical cancer or a condition that could lead to cancer, you need Pap tests and screening for cancer for at least 20 years after your treatment. If Pap tests have been discontinued, your risk factors (such as having a new sexual partner) need to be reassessed to determine if screening should resume. Some women have medical problems that increase the chance of getting cervical cancer. In these cases, your health care provider may recommend more frequent screening and Pap tests.  Colorectal cancer can be detected and often prevented. Most routine colorectal cancer screening begins at the age of 50 years and continues through age 75 years. However, your health care provider may recommend screening at an earlier age if you have risk factors for colon cancer. On a yearly basis, your health care provider may provide home test kits to check  for hidden blood in the stool. Use of a small camera at the end of a tube, to directly examine the colon (sigmoidoscopy or colonoscopy), can detect the earliest forms of colorectal cancer. Talk to your health care provider about this at age 50, when routine screening begins. Direct exam of the colon should be repeated every 5-10 years through age 75 years, unless early forms of precancerous polyps or small growths are found.  People who are at an increased risk for hepatitis B should be screened for this virus. You are considered at high risk for hepatitis B if:  You were born in a country where hepatitis B occurs often. Talk with your health care provider about which countries are considered high risk.  Your parents were born in a high-risk country and you have not received a shot to protect against hepatitis B (hepatitis B vaccine).  You have HIV or AIDS.  You use needles to inject street drugs.  You live with, or have sex with, someone who has hepatitis B.  You get hemodialysis treatment.  You take certain medicines for conditions like cancer, organ transplantation, and autoimmune conditions.  Hepatitis C blood testing is recommended for all people born from 1945 through 1965 and any individual with known risks for hepatitis C.  Practice safe sex. Use condoms and avoid high-risk sexual practices to reduce the spread of sexually transmitted infections (STIs). STIs include gonorrhea, chlamydia, syphilis, trichomonas, herpes, HPV, and human immunodeficiency virus (HIV). Herpes, HIV, and HPV are viral illnesses that have no cure. They can result in disability, cancer, and death.  You should be screened for sexually transmitted illnesses (STIs) including gonorrhea and chlamydia if:  You are sexually active and are younger than 24 years.  You are older than 24 years and your health care provider tells you that you are at risk for this type of infection.  Your sexual activity has changed  since you were last screened and you are at an increased risk for chlamydia or gonorrhea. Ask your health care provider if you are at risk.  If you are at risk of being infected with HIV, it is recommended that you take a prescription medicine daily to prevent HIV infection. This is called preexposure prophylaxis (PrEP). You are considered at risk if:  You are sexually active and do not regularly use condoms or know the HIV status of your partner(s).  You take drugs by injection.  You are sexually active with a partner who has HIV.  Talk with your health care provider about whether you are at high risk of being infected with HIV. If   you choose to begin PrEP, you should first be tested for HIV. You should then be tested every 3 months for as long as you are taking PrEP.  Osteoporosis is a disease in which the bones lose minerals and strength with aging. This can result in serious bone fractures or breaks. The risk of osteoporosis can be identified using a bone density scan. Women ages 67 years and over and women at risk for fractures or osteoporosis should discuss screening with their health care providers. Ask your health care provider whether you should take a calcium supplement or vitamin D to reduce the rate of osteoporosis.  Menopause can be associated with physical symptoms and risks. Hormone replacement therapy is available to decrease symptoms and risks. You should talk to your health care provider about whether hormone replacement therapy is right for you.  Use sunscreen. Apply sunscreen liberally and repeatedly throughout the day. You should seek shade when your shadow is shorter than you. Protect yourself by wearing long sleeves, pants, a wide-brimmed hat, and sunglasses year round, whenever you are outdoors.  Once a month, do a whole body skin exam, using a mirror to look at the skin on your back. Tell your health care provider of new moles, moles that have irregular borders, moles that  are larger than a pencil eraser, or moles that have changed in shape or color.  Stay current with required vaccines (immunizations).  Influenza vaccine. All adults should be immunized every year.  Tetanus, diphtheria, and acellular pertussis (Td, Tdap) vaccine. Pregnant women should receive 1 dose of Tdap vaccine during each pregnancy. The dose should be obtained regardless of the length of time since the last dose. Immunization is preferred during the 27th-36th week of gestation. An adult who has not previously received Tdap or who does not know her vaccine status should receive 1 dose of Tdap. This initial dose should be followed by tetanus and diphtheria toxoids (Td) booster doses every 10 years. Adults with an unknown or incomplete history of completing a 3-dose immunization series with Td-containing vaccines should begin or complete a primary immunization series including a Tdap dose. Adults should receive a Td booster every 10 years.  Varicella vaccine. An adult without evidence of immunity to varicella should receive 2 doses or a second dose if she has previously received 1 dose. Pregnant females who do not have evidence of immunity should receive the first dose after pregnancy. This first dose should be obtained before leaving the health care facility. The second dose should be obtained 4-8 weeks after the first dose.  Human papillomavirus (HPV) vaccine. Females aged 13-26 years who have not received the vaccine previously should obtain the 3-dose series. The vaccine is not recommended for use in pregnant females. However, pregnancy testing is not needed before receiving a dose. If a female is found to be pregnant after receiving a dose, no treatment is needed. In that case, the remaining doses should be delayed until after the pregnancy. Immunization is recommended for any person with an immunocompromised condition through the age of 61 years if she did not get any or all doses earlier. During the  3-dose series, the second dose should be obtained 4-8 weeks after the first dose. The third dose should be obtained 24 weeks after the first dose and 16 weeks after the second dose.  Zoster vaccine. One dose is recommended for adults aged 30 years or older unless certain conditions are present.  Measles, mumps, and rubella (MMR) vaccine. Adults born  before 1957 generally are considered immune to measles and mumps. Adults born in 1957 or later should have 1 or more doses of MMR vaccine unless there is a contraindication to the vaccine or there is laboratory evidence of immunity to each of the three diseases. A routine second dose of MMR vaccine should be obtained at least 28 days after the first dose for students attending postsecondary schools, health care workers, or international travelers. People who received inactivated measles vaccine or an unknown type of measles vaccine during 1963-1967 should receive 2 doses of MMR vaccine. People who received inactivated mumps vaccine or an unknown type of mumps vaccine before 1979 and are at high risk for mumps infection should consider immunization with 2 doses of MMR vaccine. For females of childbearing age, rubella immunity should be determined. If there is no evidence of immunity, females who are not pregnant should be vaccinated. If there is no evidence of immunity, females who are pregnant should delay immunization until after pregnancy. Unvaccinated health care workers born before 1957 who lack laboratory evidence of measles, mumps, or rubella immunity or laboratory confirmation of disease should consider measles and mumps immunization with 2 doses of MMR vaccine or rubella immunization with 1 dose of MMR vaccine.  Pneumococcal 13-valent conjugate (PCV13) vaccine. When indicated, a person who is uncertain of his immunization history and has no record of immunization should receive the PCV13 vaccine. All adults 65 years of age and older should receive this  vaccine. An adult aged 19 years or older who has certain medical conditions and has not been previously immunized should receive 1 dose of PCV13 vaccine. This PCV13 should be followed with a dose of pneumococcal polysaccharide (PPSV23) vaccine. Adults who are at high risk for pneumococcal disease should obtain the PPSV23 vaccine at least 8 weeks after the dose of PCV13 vaccine. Adults older than 70 years of age who have normal immune system function should obtain the PPSV23 vaccine dose at least 1 year after the dose of PCV13 vaccine.  Pneumococcal polysaccharide (PPSV23) vaccine. When PCV13 is also indicated, PCV13 should be obtained first. All adults aged 65 years and older should be immunized. An adult younger than age 65 years who has certain medical conditions should be immunized. Any person who resides in a nursing home or long-term care facility should be immunized. An adult smoker should be immunized. People with an immunocompromised condition and certain other conditions should receive both PCV13 and PPSV23 vaccines. People with human immunodeficiency virus (HIV) infection should be immunized as soon as possible after diagnosis. Immunization during chemotherapy or radiation therapy should be avoided. Routine use of PPSV23 vaccine is not recommended for American Indians, [place] Natives, or people younger than 65 years unless there are medical conditions that require PPSV23 vaccine. When indicated, people who have unknown immunization and have no record of immunization should receive PPSV23 vaccine. One-time revaccination 5 years after the first dose of PPSV23 is recommended for people aged 19-64 years who have chronic kidney failure, nephrotic syndrome, asplenia, or immunocompromised conditions. People who received 1-2 doses of PPSV23 before age 65 years should receive another dose of PPSV23 vaccine at age 65 years or later if at least 5 years have passed since the previous dose. Doses of PPSV23 are not  needed for people immunized with PPSV23 at or after age 65 years.  Meningococcal vaccine. Adults with asplenia or persistent complement component deficiencies should receive 2 doses of quadrivalent meningococcal conjugate (MenACWY-D) vaccine. The doses should be obtained   at least 2 months apart. Microbiologists working with certain meningococcal bacteria, [place] recruits, people at risk during an outbreak, and people who travel to or live in countries with a high rate of meningitis should be immunized. A first-year college student up through age 34 years who is living in a residence hall should receive a dose if she did not receive a dose on or after her 16th birthday. Adults who have certain high-risk conditions should receive one or more doses of vaccine.  Hepatitis A vaccine. Adults who wish to be protected from this disease, have certain high-risk conditions, work with hepatitis A-infected animals, work in hepatitis A research labs, or travel to or work in countries with a high rate of hepatitis A should be immunized. Adults who were previously unvaccinated and who anticipate close contact with an international adoptee during the first 60 days after arrival in the [place] from a country with a high rate of hepatitis A should be immunized.  Hepatitis B vaccine. Adults who wish to be protected from this disease, have certain high-risk conditions, may be exposed to blood or other infectious body fluids, are household contacts or sex partners of hepatitis B positive people, are clients or workers in certain care facilities, or travel to or work in countries with a high rate of hepatitis B should be immunized.  Haemophilus influenzae type b (Hib) vaccine. A previously unvaccinated person with asplenia or sickle cell disease or having a scheduled splenectomy should receive 1 dose of Hib vaccine. Regardless of previous immunization, a recipient of a hematopoietic stem cell transplant should receive a  3-dose series 6-12 months after her successful transplant. Hib vaccine is not recommended for adults with HIV infection. Preventive Services / Frequency Ages 35 to 4 years  Blood pressure check.** / Every 3-5 years.  Lipid and cholesterol check.** / Every 5 years beginning at age 60.  Clinical breast exam.** / Every 3 years for women in their 71s and 10s.  BRCA-related cancer risk assessment.** / For women who have family members with a BRCA-related cancer (breast, ovarian, tubal, or peritoneal cancers).  Pap test.** / Every 2 years from ages 76 through 26. Every 3 years starting at age 61 through age 76 or 93 with a history of 3 consecutive normal Pap tests.  HPV screening.** / Every 3 years from ages 37 through ages 60 to 51 with a history of 3 consecutive normal Pap tests.  Hepatitis C blood test.** / For any individual with known risks for hepatitis C.  Skin self-exam. / Monthly.  Influenza vaccine. / Every year.  Tetanus, diphtheria, and acellular pertussis (Tdap, Td) vaccine.** / Consult your health care provider. Pregnant women should receive 1 dose of Tdap vaccine during each pregnancy. 1 dose of Td every 10 years.  Varicella vaccine.** / Consult your health care provider. Pregnant females who do not have evidence of immunity should receive the first dose after pregnancy.  HPV vaccine. / 3 doses over 6 months, if 93 and younger. The vaccine is not recommended for use in pregnant females. However, pregnancy testing is not needed before receiving a dose.  Measles, mumps, rubella (MMR) vaccine.** / You need at least 1 dose of MMR if you were born in 1957 or later. You may also need a 2nd dose. For females of childbearing age, rubella immunity should be determined. If there is no evidence of immunity, females who are not pregnant should be vaccinated. If there is no evidence of immunity, females who are  pregnant should delay immunization until after pregnancy.  Pneumococcal  13-valent conjugate (PCV13) vaccine.** / Consult your health care provider.  Pneumococcal polysaccharide (PPSV23) vaccine.** / 1 to 2 doses if you smoke cigarettes or if you have certain conditions.  Meningococcal vaccine.** / 1 dose if you are age 68 to 8 years and a Market researcher living in a residence hall, or have one of several medical conditions, you need to get vaccinated against meningococcal disease. You may also need additional booster doses.  Hepatitis A vaccine.** / Consult your health care provider.  Hepatitis B vaccine.** / Consult your health care provider.  Haemophilus influenzae type b (Hib) vaccine.** / Consult your health care provider. Ages 7 to 53 years  Blood pressure check.** / Every year.  Lipid and cholesterol check.** / Every 5 years beginning at age 25 years.  Lung cancer screening. / Every year if you are aged 11-80 years and have a 30-pack-year history of smoking and currently smoke or have quit within the past 15 years. Yearly screening is stopped once you have quit smoking for at least 15 years or develop a health problem that would prevent you from having lung cancer treatment.  Clinical breast exam.** / Every year after age 48 years.  BRCA-related cancer risk assessment.** / For women who have family members with a BRCA-related cancer (breast, ovarian, tubal, or peritoneal cancers).  Mammogram.** / Every year beginning at age 41 years and continuing for as long as you are in good health. Consult with your health care provider.  Pap test.** / Every 3 years starting at age 65 years through age 37 or 70 years with a history of 3 consecutive normal Pap tests.  HPV screening.** / Every 3 years from ages 72 years through ages 60 to 40 years with a history of 3 consecutive normal Pap tests.  Fecal occult blood test (FOBT) of stool. / Every year beginning at age 21 years and continuing until age 5 years. You may not need to do this test if you get  a colonoscopy every 10 years.  Flexible sigmoidoscopy or colonoscopy.** / Every 5 years for a flexible sigmoidoscopy or every 10 years for a colonoscopy beginning at age 35 years and continuing until age 48 years.  Hepatitis C blood test.** / For all people born from [birth year] through 1965 and any individual with known risks for hepatitis C.  Skin self-exam. / Monthly.  Influenza vaccine. / Every year.  Tetanus, diphtheria, and acellular pertussis (Tdap/Td) vaccine.** / Consult your health care provider. Pregnant women should receive 1 dose of Tdap vaccine during each pregnancy. 1 dose of Td every 10 years.  Varicella vaccine.** / Consult your health care provider. Pregnant females who do not have evidence of immunity should receive the first dose after pregnancy.  Zoster vaccine.** / 1 dose for adults aged 30 years or older.  Measles, mumps, rubella (MMR) vaccine.** / You need at least 1 dose of MMR if you were born in 1957 or later. You may also need a second dose. For females of childbearing age, rubella immunity should be determined. If there is no evidence of immunity, females who are not pregnant should be vaccinated. If there is no evidence of immunity, females who are pregnant should delay immunization until after pregnancy.  Pneumococcal 13-valent conjugate (PCV13) vaccine.** / Consult your health care provider.  Pneumococcal polysaccharide (PPSV23) vaccine.** / 1 to 2 doses if you smoke cigarettes or if you have certain conditions.  Meningococcal vaccine.** /  Consult your health care provider.  Hepatitis A vaccine.** / Consult your health care provider.  Hepatitis B vaccine.** / Consult your health care provider.  Haemophilus influenzae type b (Hib) vaccine.** / Consult your health care provider. Ages 64 years and over  Blood pressure check.** / Every year.  Lipid and cholesterol check.** / Every 5 years beginning at age 23 years.  Lung cancer screening. / Every year if you  are aged 16-80 years and have a 30-pack-year history of smoking and currently smoke or have quit within the past 15 years. Yearly screening is stopped once you have quit smoking for at least 15 years or develop a health problem that would prevent you from having lung cancer treatment.  Clinical breast exam.** / Every year after age 74 years.  BRCA-related cancer risk assessment.** / For women who have family members with a BRCA-related cancer (breast, ovarian, tubal, or peritoneal cancers).  Mammogram.** / Every year beginning at age 44 years and continuing for as long as you are in good health. Consult with your health care provider.  Pap test.** / Every 3 years starting at age 58 years through age 22 or 39 years with 3 consecutive normal Pap tests. Testing can be stopped between 65 and 70 years with 3 consecutive normal Pap tests and no abnormal Pap or HPV tests in the past 10 years.  HPV screening.** / Every 3 years from ages 64 years through ages 70 or 61 years with a history of 3 consecutive normal Pap tests. Testing can be stopped between 65 and 70 years with 3 consecutive normal Pap tests and no abnormal Pap or HPV tests in the past 10 years.  Fecal occult blood test (FOBT) of stool. / Every year beginning at age 40 years and continuing until age 27 years. You may not need to do this test if you get a colonoscopy every 10 years.  Flexible sigmoidoscopy or colonoscopy.** / Every 5 years for a flexible sigmoidoscopy or every 10 years for a colonoscopy beginning at age 7 years and continuing until age 32 years.  Hepatitis C blood test.** / For all people born from [birth year] through 1965 and any individual with known risks for hepatitis C.  Osteoporosis screening.** / A one-time screening for women ages 30 years and over and women at risk for fractures or osteoporosis.  Skin self-exam. / Monthly.  Influenza vaccine. / Every year.  Tetanus, diphtheria, and acellular pertussis (Tdap/Td)  vaccine.** / 1 dose of Td every 10 years.  Varicella vaccine.** / Consult your health care provider.  Zoster vaccine.** / 1 dose for adults aged 35 years or older.  Pneumococcal 13-valent conjugate (PCV13) vaccine.** / Consult your health care provider.  Pneumococcal polysaccharide (PPSV23) vaccine.** / 1 dose for all adults aged 46 years and older.  Meningococcal vaccine.** / Consult your health care provider.  Hepatitis A vaccine.** / Consult your health care provider.  Hepatitis B vaccine.** / Consult your health care provider.  Haemophilus influenzae type b (Hib) vaccine.** / Consult your health care provider. ** Family history and personal history of risk and conditions may change your health care provider's recommendations.   This information is not intended to replace advice given to you by your health care provider. Make sure you discuss any questions you have with your health care provider.   Document Released: 03/28/2001 Document Revised: 02/20/2014 Document Reviewed: 06/27/2010 Elsevier Interactive Patient Education [employer].

## 2015-12-14 NOTE — Progress Notes (Signed)
Pre visit review using our clinic review tool, if applicable. No additional management support is needed unless otherwise documented below in the visit note. 

## 2015-12-14 NOTE — Progress Notes (Signed)
Subjective:   Connie Owens is a 70 y.o. female who presents for Medicare Annual (Subsequent) preventive examination.  Review of Systems:     Review of Systems  Constitutional: Negative for activity change, appetite change and fatigue.  HENT: Negative for hearing loss, congestion, tinnitus and ear discharge.   Eyes: Negative for visual disturbance (see optho q78m -- vision corrected to 20/20 with glasses).  Respiratory: Negative for cough, chest tightness and shortness of breath.   Cardiovascular: Negative for chest pain, palpitations and leg swelling.  Gastrointestinal: Negative for abdominal pain, diarrhea, constipation and abdominal distention.  Genitourinary: Negative for urgency, frequency, decreased urine volume and difficulty urinating.  Musculoskeletal: Negative for back pain, arthralgias and gait problem.  Skin: Negative for color change, pallor and rash.  Neurological: Negative for dizziness, light-headedness, numbness and headaches.  Hematological: Negative for adenopathy. Does not bruise/bleed easily.  Psychiatric/Behavioral: Negative for suicidal ideas, confusion, sleep disturbance, self-injury, dysphoric mood, decreased concentration and agitation.  Pt is able to read and write and can do all ADLs No risk for falling No abuse/ violence in home      Objective:     Vitals: BP 130/78 (BP Location: Left Arm, Patient Position: Sitting, Cuff Size: Large)   Pulse 61   Temp 98.3 F (36.8 C) (Oral)   Resp 16   Ht 5\' 3"  (1.6 m)   Wt 206 lb (93.4 kg)   SpO2 97%   BMI 36.49 kg/m   Body mass index is 36.49 kg/m. BP 130/78 (BP Location: Left Arm, Patient Position: Sitting, Cuff Size: Large)   Pulse 61   Temp 98.3 F (36.8 C) (Oral)   Resp 16   Ht 5\' 3"  (1.6 m)   Wt 206 lb (93.4 kg)   SpO2 97%   BMI 36.49 kg/m  General appearance: alert, cooperative, appears stated age and no distress Head: Normocephalic, without obvious abnormality, atraumatic Eyes:  conjunctivae/corneas clear. PERRL, EOM's intact. Fundi benign. Ears: normal TM's and external ear canals both ears Nose: Nares normal. Septum midline. Mucosa normal. No drainage or sinus tenderness. Throat: lips, mucosa, and tongue normal; teeth and gums normal Neck: no adenopathy, no carotid bruit, no JVD, supple, symmetrical, trachea midline and thyroid not enlarged, symmetric, no tenderness/mass/nodules Back: symmetric, no curvature. ROM normal. No CVA tenderness. Lungs: clear to auscultation bilaterally Breasts: normal appearance, no masses or tenderness Heart: regular rate and rhythm, S1, S2 normal, no murmur, click, rub or gallop Abdomen: soft, non-tender; bowel sounds normal; no masses,  no organomegaly Pelvic: deferred Extremities: extremities normal, atraumatic, no cyanosis or edema Pulses: 2+ and symmetric Skin: Skin color, texture, turgor normal. No rashes or lesions Lymph nodes: Cervical, supraclavicular, and axillary nodes normal. Neurologic: Alert and oriented X 3, normal strength and tone. Normal symmetric reflexes. Normal coordination and gait  Tobacco History  Smoking Status  . Never Smoker  Smokeless Tobacco  . Never Used     Counseling given: Not Answered   Past Medical History:  Diagnosis Date  . Anxiety state    NOS  . Arthritis    osteo...right knee  . Back pain   . CAD (coronary artery disease)    NSTEMI 8/13 => LHC 09/27/11: oLM 50%, dLM 30%, oLAD 60-70%, pLAD 50%, mLAD 70%, pD1 30%, pD2 40%, oRCA 25%, mRCA 40%, EF 50% with ant HK => LAD not amenable to PCI => med Rx  unless recurrent angina = > consider CABG  . Carpal tunnel syndrome   . Corneal disorder   .  Diverticular disease   . Dysmetabolic syndrome X   . GERD (gastroesophageal reflux disease)   . 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   [name] Headache, paroxysmal hemicrania, chronic 07/23/2012    Referral to BOTOX evaluation  with dr Rexene Alberts or Krista Blue .  [name] Heart attack 09/2011  . Hernia    hiatal  . HLD (hyperlipidemia)   . Hx of adenomatous colonic polyps   . Hypertension    essen. NOS  . Ischemic cardiomyopathy    a.  Echo 09/29/11: EF 30-35% with inferior, posterior, lateral AK, anterior severe HK, mild LVH, mild MR, PASP 40;  => b. follow up echo 10/13:  EF 55-60%, Gr 1 diast dysfn, mild MR  . Migraine headache without aura 07/23/2012    Left sided  Lamonte Sakai thought to be migrainous  , hypersensitivity to touch , photophobia ,  no nausea.   [name] Postmenopausal status   . Shoulder fracture   . Sleep apnea    CPAP   Past Surgical History:  Procedure Laterality Date  . ABDOMINAL HYSTERECTOMY    . ankle cystectomy Right   . APPENDECTOMY    . BREAST LUMPECTOMY Right 1996  . BUNIONECTOMY Right   . CARDIAC CATHETERIZATION  09/28/2011   Dr Acie Fredrickson(  to see Dr Ron Parker)  . CHOLECYSTECTOMY    . COLONOSCOPY W/ POLYPECTOMY    . 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 CORNEAL TRANSPLANT Left    x1  . HEMORRHOID SURGERY  12/01/2010, 2014   [place] hemorrhoid ligation/pexy  . LEFT HEART CATHETERIZATION WITH CORONARY ANGIOGRAM N/A 09/28/2011   Procedure: LEFT HEART CATHETERIZATION WITH CORONARY ANGIOGRAM;  Surgeon: Minus Breeding, MD;  Location: [facility] C[facility] LAB;  Service: Cardiovascular;  Laterality: N/A;  . lumbar spur removed    . rectocele surgery    . TOTAL KNEE ARTHROPLASTY Right    Family History  Problem Relation Age of Onset  . Breast cancer Mother   . Heart disease Father   . Stroke Father   . Dementia Father   . Prostate cancer Father   . Diabetes Sister     3 sisters   . Heart disease Sister     1 sister CABG  . Breast cancer Sister     PTE pre & post Dx of ca  . Diabetes Paternal Grandfather   . Colon cancer Neg Hx   . Breast cancer Sister    History  Sexual Activity  . Sexual activity: Not on file    Outpatient Encounter Prescriptions as of 12/14/2015  Medication  Sig  . aspirin 81 MG tablet Take 81 mg by mouth daily.  . brimonidine-timolol (COMBIGAN) 0.2-0.5 % ophthalmic solution Apply to eye. 2 gtts L eye, 1 gtt R eye.  . carvedilol (COREG) 6.25 MG tablet Take 1 tablet (6.25 mg total) by mouth 2 (two) times daily.  . clobetasol ointment (TEMOVATE) AB-123456789 % Apply 1 application topically 2 (two) times daily.  . clopidogrel (PLAVIX) 75 MG tablet Take 1 tablet (75 mg total) by mouth daily with breakfast.  . diphenoxylate-atropine (LOMOTIL) 2.5-0.025 MG per tablet 1 to 2 tablets in the morning, then every 6 hours as needed for diarrhea.  . estradiol (ESTRACE) 1 MG tablet Take 5 mg by mouth daily.   [name] gabapentin (NEURONTIN) 100 MG capsule 2 po tid for pain  . losartan (COZAAR) 50 MG tablet Take  1 tablet (50 mg total) by mouth daily. Needs appt.  . nitroGLYCERIN (NITROSTAT) 0.4 MG SL tablet Place 1 tablet (0.4 mg total) under the tongue every 5 (five) minutes x 3 doses as needed for chest pain.  . nortriptyline (PAMELOR) 10 MG capsule Take 5 capsules (50 mg total) by mouth at bedtime.  . predniSONE 5 MG/5ML solution Take by mouth daily with breakfast. Used as EYE DROPS 1%  . zolpidem (AMBIEN) 10 MG tablet Take 1 tablet (10 mg total) by mouth at bedtime as needed for sleep.  . [DISCONTINUED] clobetasol ointment (TEMOVATE) [id number] % Apply 1 application topically 2 (two) times daily.  . [DISCONTINUED] gabapentin (NEURONTIN) 100 MG capsule 2 po tid for pain   No facility-administered encounter medications on file as of 12/14/2015.     Activities of Daily Living In your present state of health, do you have any difficulty performing the following activities: 12/14/2015  Hearing? N  Vision? Y  Difficulty concentrating or making decisions? N  Walking or climbing stairs? N  Dressing or bathing? N  Doing errands, shopping? N  Some recent data might be hidden    Patient Care Team: Ann Held, DO as PCP - General (Family Medicine) Sharyne Peach, MD as  Consulting Physician (Ophthalmology) Tomma Rakers, MD as Referring Physician (Ophthalmology) Minus Breeding, MD as Consulting Physician (Cardiology) Mauri Pole, MD as Consulting Physician (Gastroenterology) Bobbye Charleston, MD as Consulting Physician (Obstetrics and Gynecology) Larey Seat, MD as Consulting Physician (Neurology) Byrd Hesselbach, MD as Referring Physician (Ophthalmology)    Assessment:    cpe Exercise Activities and Dietary recommendations Current Exercise Habits: Home exercise routine, Type of exercise: walking, Intensity: Mild, Exercise limited by: None identified  Goals    None     Fall Risk Fall Risk  12/14/2015 11/17/2014 03/18/2013  Falls in the past year? No No No   Depression Screen PHQ 2/9 Scores 12/14/2015 01/09/2015 11/17/2014 03/18/2013  PHQ - 2 Score 0 0 0 0     Cognitive Function MMSE - Mini Mental State Exam 12/15/2015  Orientation to time 5  Orientation to Place 5  Registration 3  Attention/ Calculation 5  Recall 3  Language- name 2 objects 2  Language- repeat 1  Language- follow 3 step command 3  Language- read & follow direction 1  Write a sentence 1  Copy design 1  Total score 30        Immunization History  Administered Date(s) Administered  . Influenza Split 11/16/2010, 11/07/2011  . Influenza Whole 11/06/2007, 12/11/2008, 11/16/2009  . Influenza, High Dose Seasonal PF 11/19/2012, 12/29/2013, 11/17/2014  . Pneumococcal Conjugate-13 12/29/2013  . Pneumococcal Polysaccharide-23 09/29/2011  . Zoster 12/31/2012   Screening Tests Health Maintenance  Topic Date Due  . Hepatitis C Screening  10-Jan-1946  . TETANUS/TDAP  04/21/1964  . INFLUENZA VACCINE  12/13/2016 (Originally 09/14/2015)  . MAMMOGRAM  06/15/2017  . COLONOSCOPY  12/09/2017  . DEXA SCAN  Completed  . ZOSTAVAX  Completed  . PNA vac Low Risk Adult  Completed      Plan:    During the course of the visit the patient was educated and counseled about  the following appropriate screening and preventive services:   Vaccines to include Pneumoccal, Influenza, Hepatitis B, Td, Zostavax, HCV  Electrocardiogram  Cardiovascular Disease  Colorectal cancer screening  Bone density screening  Diabetes screening  Glaucoma screening  Mammography/PAP  Nutrition counseling   Patient Instructions (the written plan) was given to the  patient.  1. Neuropathic pain stable - gabapentin (NEURONTIN) 100 MG capsule; 2 po tid for pain  Dispense: 540 capsule; Refill: 3  2. Psoriasis  - clobetasol ointment (TEMOVATE) 0.05 %; Apply 1 application topically 2 (two) times daily.  Dispense: 90 g; Refill: 3  3. NSTEMI (non-ST elevated myocardial infarction) [facility]) Per cardiology - Comprehensive metabolic panel; Future - Lipid panel; Future - CBC with Differential/Platelet; Future - POCT urinalysis dipstick; Future - TSH; Future  4. Fatigue, unspecified type   - TSH; Future  5. Encounter for Medicare annual wellness exam See above  Ann Held, DO  12/15/2015

## 2015-12-16 ENCOUNTER — Other Ambulatory Visit (INDEPENDENT_AMBULATORY_CARE_PROVIDER_SITE_OTHER): Payer: Medicare Other

## 2015-12-16 DIAGNOSIS — M792 Neuralgia and neuritis, unspecified: Secondary | ICD-10-CM | POA: Diagnosis not present

## 2015-12-16 DIAGNOSIS — R5383 Other fatigue: Secondary | ICD-10-CM

## 2015-12-16 DIAGNOSIS — L409 Psoriasis, unspecified: Secondary | ICD-10-CM

## 2015-12-16 DIAGNOSIS — I214 Non-ST elevation (NSTEMI) myocardial infarction: Secondary | ICD-10-CM | POA: Diagnosis not present

## 2015-12-16 DIAGNOSIS — Z0000 Encounter for general adult medical examination without abnormal findings: Secondary | ICD-10-CM

## 2015-12-16 LAB — CBC WITH DIFFERENTIAL/PLATELET
Basophils Absolute: 0 10*3/uL (ref 0.0–0.1)
Basophils Relative: 0.4 % (ref 0.0–3.0)
EOS PCT: 2.7 % (ref 0.0–5.0)
Eosinophils Absolute: 0.2 10*3/uL (ref 0.0–0.7)
HCT: 42.3 % (ref 36.0–46.0)
HEMOGLOBIN: 14.2 g/dL (ref 12.0–15.0)
Lymphocytes Relative: 26.9 % (ref 12.0–46.0)
Lymphs Abs: 1.8 10*3/uL (ref 0.7–4.0)
MCHC: 33.6 g/dL (ref 30.0–36.0)
MCV: 89.2 fl (ref 78.0–100.0)
MONO ABS: 0.8 10*3/uL (ref 0.1–1.0)
Monocytes Relative: 11.6 % (ref 3.0–12.0)
Neutro Abs: 3.8 10*3/uL (ref 1.4–7.7)
Neutrophils Relative %: 58.4 % (ref 43.0–77.0)
Platelets: 317 10*3/uL (ref 150.0–400.0)
RBC: 4.74 Mil/uL (ref 3.87–5.11)
RDW: 15.7 % — ABNORMAL HIGH (ref 11.5–15.5)
WBC: 6.6 10*3/uL (ref 4.0–10.5)

## 2015-12-16 LAB — COMPREHENSIVE METABOLIC PANEL
ALBUMIN: 4 g/dL (ref 3.5–5.2)
ALK PHOS: 67 U/L (ref 39–117)
ALT: 16 U/L (ref 0–35)
AST: 17 U/L (ref 0–37)
BILIRUBIN TOTAL: 0.3 mg/dL (ref 0.2–1.2)
BUN: 13 mg/dL (ref 6–23)
CO2: 29 mEq/L (ref 19–32)
CREATININE: 0.84 mg/dL (ref 0.40–1.20)
Calcium: 9.9 mg/dL (ref 8.4–10.5)
Chloride: 101 mEq/L (ref 96–112)
GFR: 71.11 mL/min (ref >60.00)
Glucose, Bld: 129 mg/dL — ABNORMAL HIGH (ref 70–99)
Potassium: 4.5 mEq/L (ref 3.5–5.1)
SODIUM: 137 meq/L (ref 135–145)
TOTAL PROTEIN: 7.2 g/dL (ref 6.0–8.3)

## 2015-12-16 LAB — URINALYSIS, ROUTINE W REFLEX MICROSCOPIC
Bilirubin Urine: NEGATIVE
HGB URINE DIPSTICK: NEGATIVE
Ketones, ur: NEGATIVE
Nitrite: POSITIVE — AB
Specific Gravity, Urine: 1.015 (ref 1.000–1.030)
Total Protein, Urine: NEGATIVE
URINE GLUCOSE: NEGATIVE
UROBILINOGEN UA: 0.2 (ref 0.0–1.0)
pH: 6 (ref 5.0–8.0)

## 2015-12-16 LAB — TSH: TSH: 2 u[IU]/mL (ref 0.35–4.50)

## 2015-12-16 LAB — LIPID PANEL
CHOL/HDL RATIO: 3
CHOLESTEROL: 192 mg/dL (ref 0–200)
HDL: 59.3 mg/dL (ref >39.00)
LDL CALC: 100 mg/dL — AB (ref 0–99)
NONHDL: 132.39
TRIGLYCERIDES: 160 mg/dL — AB (ref 0.0–149.0)
VLDL: 32 mg/dL (ref 0.0–40.0)

## 2015-12-16 NOTE — Addendum Note (Signed)
Addended by: Peggyann Shoals on: 12/16/2015 09:49 AM   Modules accepted: Orders

## 2015-12-17 MED ORDER — CIPROFLOXACIN HCL 250 MG PO TABS: 250.0000 mg | ORAL_TABLET | Freq: Two times a day (BID) | ORAL | 0 refills | Status: DC

## 2015-12-17 MED ORDER — FENOFIBRATE 160 MG PO TABS: 160.0000 mg | ORAL_TABLET | Freq: Every day | ORAL | 2 refills | Status: DC

## 2015-12-17 NOTE — Progress Notes (Signed)
I have filled Rx for Fenofibrate and sent that to [facility] per Patient request. TL/CMA  I have also filled Cipro 250 mg and sent Rx to [facility] per patient request. TL/CMA

## 2015-12-29 DIAGNOSIS — Z947 Corneal transplant status: Secondary | ICD-10-CM | POA: Diagnosis not present

## 2015-12-29 DIAGNOSIS — H40003 Preglaucoma, unspecified, bilateral: Secondary | ICD-10-CM | POA: Diagnosis not present

## 2016-01-03 ENCOUNTER — Other Ambulatory Visit: Payer: Self-pay | Admitting: Neurology

## 2016-01-03 MED ORDER — ZOLPIDEM TARTRATE 10 MG PO TABS: 10.0000 mg | ORAL_TABLET | Freq: Every evening | ORAL | 1 refills | Status: DC | PRN

## 2016-01-03 NOTE — Telephone Encounter (Signed)
Sent to Dr. Brett Fairy for review.

## 2016-01-03 NOTE — Telephone Encounter (Signed)
RX for ambien faxed to [facility]. Received a receipt of confirmation.  

## 2016-01-03 NOTE — Telephone Encounter (Signed)
Patient called to request refill of zolpidem (AMBIEN) 10 MG tablet

## 2016-01-13 ENCOUNTER — Encounter: Payer: Self-pay | Admitting: Family Medicine

## 2016-01-13 ENCOUNTER — Other Ambulatory Visit (HOSPITAL_COMMUNITY)
Admission: RE | Admit: 2016-01-13 | Discharge: 2016-01-13 | Disposition: A | Discharge status: Home / Self Care | Payer: Medicare Other | Source: Ambulatory Visit | Attending: Family Medicine | Admitting: Family Medicine

## 2016-01-13 ENCOUNTER — Ambulatory Visit (INDEPENDENT_AMBULATORY_CARE_PROVIDER_SITE_OTHER): Payer: Medicare Other | Admitting: Family Medicine

## 2016-01-13 VITALS — BP 127/77 | HR 77 | Temp 98.3°F | Resp 16 | Ht 63.0 in | Wt 205.6 lb

## 2016-01-13 DIAGNOSIS — I255 Ischemic cardiomyopathy: Secondary | ICD-10-CM

## 2016-01-13 DIAGNOSIS — N949 Unspecified condition associated with female genital organs and menstrual cycle: Secondary | ICD-10-CM | POA: Insufficient documentation

## 2016-01-13 DIAGNOSIS — Z7251 High risk heterosexual behavior: Secondary | ICD-10-CM

## 2016-01-13 DIAGNOSIS — R300 Dysuria: Secondary | ICD-10-CM

## 2016-01-13 DIAGNOSIS — Z8719 Personal history of other diseases of the digestive system: Secondary | ICD-10-CM | POA: Diagnosis not present

## 2016-01-13 DIAGNOSIS — R1032 Left lower quadrant pain: Secondary | ICD-10-CM

## 2016-01-13 LAB — POC URINALSYSI DIPSTICK (AUTOMATED)
Bilirubin, UA: NEGATIVE
Ketones, UA: NEGATIVE
LEUKOCYTES UA: NEGATIVE
NITRITE UA: NEGATIVE
PH UA: 6.5
Protein, UA: NEGATIVE
RBC UA: NEGATIVE
Spec Grav, UA: 1.02
UROBILINOGEN UA: NEGATIVE

## 2016-01-13 LAB — COMPREHENSIVE METABOLIC PANEL
ALBUMIN: 4.1 g/dL (ref 3.5–5.2)
ALK PHOS: 53 U/L (ref 39–117)
ALT: 16 U/L (ref 0–35)
AST: 20 U/L (ref 0–37)
BILIRUBIN TOTAL: 0.3 mg/dL (ref 0.2–1.2)
BUN: 18 mg/dL (ref 6–23)
CALCIUM: 9.8 mg/dL (ref 8.4–10.5)
CO2: 28 mEq/L (ref 19–32)
CREATININE: 0.88 mg/dL (ref 0.40–1.20)
Chloride: 103 mEq/L (ref 96–112)
GFR: 67.38 mL/min (ref >60.00)
Glucose, Bld: 109 mg/dL — ABNORMAL HIGH (ref 70–99)
Potassium: 4.4 mEq/L (ref 3.5–5.1)
SODIUM: 138 meq/L (ref 135–145)
TOTAL PROTEIN: 7.4 g/dL (ref 6.0–8.3)

## 2016-01-13 LAB — CBC
HCT: 43 % (ref 36.0–46.0)
Hemoglobin: 14.3 g/dL (ref 12.0–15.0)
MCHC: 33.3 g/dL (ref 30.0–36.0)
MCV: 89.7 fl (ref 78.0–100.0)
PLATELETS: 318 10*3/uL (ref 150.0–400.0)
RBC: 4.8 Mil/uL (ref 3.87–5.11)
RDW: 16.2 % — ABNORMAL HIGH (ref 11.5–15.5)
WBC: 9.5 10*3/uL (ref 4.0–10.5)

## 2016-01-13 MED ORDER — AMOXICILLIN-POT CLAVULANATE 875-125 MG PO TABS
1.0000 | ORAL_TABLET | Freq: Two times a day (BID) | ORAL | 0 refills | Status: DC
Start: 2016-01-13 — End: 2016-04-24

## 2016-01-13 NOTE — Patient Instructions (Signed)
We will check your urine, vaginal yeast infection test and gonorrhea/ chlamydia tests for you We will also check blood work today I suspect you have a diverticulitis flare- use the Augmentin antibiotic as directed for 10 days, and stick to either liquids or a soft, bland diet for the next 1-3 days.   If you are getting worse, have a fever or any other concerns please let me know! Use some of your premarin cream on the vaginal opening to ease your discomfort- you can use this daily for 1-2 weeks, and then taper down to a few times a day as needed

## 2016-01-13 NOTE — Progress Notes (Signed)
Pre visit review using our clinic review tool, if applicable. No additional management support is needed unless otherwise documented below in the visit note. 

## 2016-01-13 NOTE — Progress Notes (Addendum)
Your [place] at [facility] [address] [place], [place] [address] [phone]  Date:  01/13/2016   Name:  TASHIYA KRUPA   DOB:  05/31/1945   MRN:  [id number]  PCP:  Ann Held, DO    Chief Complaint: vaginal irritation; lower abdominal pain; and Dysuria   History of Present Illness:  Connie Owens is a 70 y.o. very pleasant female patient who presents with the following:  She has noted a "burning stinging" in her vaginal area, as well as urinary and abd discomfort.  She has noted these sx for 1-2 weeks She is not generally SA, but she was in a relationship with a gentleman until about 6 months ago when he moved away.  Her husband passed away a while ago- she has only been SA with these two partners.  She would like to have have gonorrhea and chlamydia testing to make sure that all is well  The sx will come and go She has some dysuria She has not noted any vaginal discharge No blood in her urine.  She does have some urinary urgency and frequency- but thinks this is due to drinking lots of water No fever She notes no belly pain, but can have a mild discomfort in the suprapubic area.   She did have diverticulitis this spring- diagnosed on a CT scan.   She has not noted any nausea, vomiting, or diarrhea  She has premarin vaginal cream that she can use for vaginal dryness as needed  She is s/p hysterectomy and appendectomy  Patient Active Problem List   Diagnosis Date Noted  . History of colonic polyps 02-15-202016  . CAD (coronary artery disease) 07/23/2014  . Glaucoma (increased eye pressure) 07/30/2013  . OSA on CPAP 07/30/2013  . Obesity, unspecified 07/30/2013  . Obesity (BMI 30-39.9) 03/18/2013  . Abdominal pain, other specified site 03/18/2013  . ERRONEOUS ENCOUNTER--DISREGARD 03/06/2013  . Internal hemorrhoids with other complication 0000000  . Nonspecific abnormal finding in stool contents  11/05/2012  . Abdominal pain, left upper quadrant 11/05/2012  . Diffuse pain 07/24/2012  . Migraine headache without aura 07/23/2012  . Headache, paroxysmal hemicrania, chronic 07/23/2012  . OSA (obstructive sleep apnea) 06/11/2012  . NSTEMI (non-ST elevated myocardial infarction) ([place]) 09/28/2011  . IBS (irritable bowel syndrome) 03/22/2011  . Hyperlipidemia 12/21/2010  . DIARRHEA, CHRONIC 01/13/2010  . COCCYGEAL PAIN 05/11/2009  . Anorectal pain 02/02/2009  . HIP PAIN, LEFT 02/02/2009  . CORNEAL DISORDER 12/11/2008  . POSTMENOPAUSAL STATUS 12/11/2008  . ADVERSE DRUG REACTION 12/11/2008  . DYSPHAGIA UNSPECIFIED 10/02/2008  . BACK PAIN 07/15/2008  . Dysmetabolic syndrome X 99991111  . OSTEOARTHRITIS, KNEE, RIGHT 06/19/2007  . CARPAL TUNNEL SYNDROME 04/25/2007  . ADENOMATOUS COLONIC POLYP 04/23/2007  . GERD 04/23/2007  . DIVERTICULAR DISEASE 04/23/2007  . HIATAL HERNIA 11/16/2006  . ANXIETY STATE NOS 11/12/2006  . Essential hypertension 10/30/2006    Past Medical History:  Diagnosis Date  . Anxiety state    NOS  . Arthritis    osteo...right knee  . Back pain   . CAD (coronary artery disease)    NSTEMI 8/13 => LHC 09/27/11: oLM 50%, dLM 30%, oLAD 60-70%, pLAD 50%, mLAD 70%, pD1 30%, pD2 40%, oRCA 25%, mRCA 40%, EF 50% with ant HK => LAD not amenable to PCI => med Rx  unless recurrent angina = > consider CABG  . Carpal tunnel syndrome   . Corneal disorder   .  Diverticular disease   . Dysmetabolic syndrome X   . GERD (gastroesophageal reflux disease)   . 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   [name] Headache, paroxysmal hemicrania, chronic 07/23/2012    Referral to BOTOX evaluation with dr Rexene Alberts or Krista Blue .  [name] Heart attack 09/2011  . Hernia    hiatal  . HLD (hyperlipidemia)   . Hx of adenomatous colonic polyps   . Hypertension    essen. NOS  . Ischemic cardiomyopathy    a.  Echo 09/29/11: EF 30-35% with  inferior, posterior, lateral AK, anterior severe HK, mild LVH, mild MR, PASP 40;  => b. follow up echo 10/13:  EF 55-60%, Gr 1 diast dysfn, mild MR  . Migraine headache without aura 07/23/2012    Left sided  Lamonte Sakai thought to be migrainous  , hypersensitivity to touch , photophobia ,  no nausea.   [name] Postmenopausal status   . Shoulder fracture   . Sleep apnea    CPAP    Past Surgical History:  Procedure Laterality Date  . ABDOMINAL HYSTERECTOMY    . ankle cystectomy Right   . APPENDECTOMY    . BREAST LUMPECTOMY Right 1996  . BUNIONECTOMY Right   . CARDIAC CATHETERIZATION  09/28/2011   Dr Acie Fredrickson(  to see Dr Ron Parker)  . CHOLECYSTECTOMY    . COLONOSCOPY W/ POLYPECTOMY    . 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 CORNEAL TRANSPLANT Left    x1  . HEMORRHOID SURGERY  12/01/2010, 2014   [place] hemorrhoid ligation/pexy  . LEFT HEART CATHETERIZATION WITH CORONARY ANGIOGRAM N/A 09/28/2011   Procedure: LEFT HEART CATHETERIZATION WITH CORONARY ANGIOGRAM;  Surgeon: Minus Breeding, MD;  Location: [facility] CATH LAB;  Service: Cardiovascular;  Laterality: N/A;  . lumbar spur removed    . rectocele surgery    . TOTAL KNEE ARTHROPLASTY Right     Social History  Substance Use Topics  . Smoking status: Never Smoker  . Smokeless tobacco: Never Used  . Alcohol use Yes     Comment: rare    Family History  Problem Relation Age of Onset  . Breast cancer Mother   . Heart disease Father   . Stroke Father   . Dementia Father   . Prostate cancer Father   . Diabetes Sister     3 sisters   . Heart disease Sister     1 sister CABG  . Breast cancer Sister     PTE pre & post Dx of ca  . Diabetes Paternal Grandfather   . Colon cancer Neg Hx   . Breast cancer Sister     Allergies  Allergen Reactions  . Iohexol     rash  . Iodine   . Tape     Plastic tape  . Imdur [Isosorbide Nitrate]     Severe headaches  . Latex Rash  . Verapamil     REACTION: dizziness     Medication list has been reviewed and updated.  Current Outpatient Prescriptions on File Prior to Visit  Medication Sig Dispense Refill  . aspirin 81 MG tablet Take 81 mg by mouth daily.    . brimonidine-timolol (COMBIGAN) 0.2-0.5 % ophthalmic solution Apply to eye. 2 gtts L eye, 1 gtt R eye.    . carvedilol (COREG) 6.25 MG tablet Take 1 tablet (6.25 mg total) by mouth 2 (two) times daily. [place]  tablet 3  . clobetasol ointment (TEMOVATE) [id number] % Apply 1 application topically 2 (two) times daily. 90 g 3  . clopidogrel (PLAVIX) 75 MG tablet Take 1 tablet (75 mg total) by mouth daily with breakfast. 90 tablet 3  . diphenoxylate-atropine (LOMOTIL) 2.5-0.025 MG per tablet 1 to 2 tablets in the morning, then every 6 hours as needed for diarrhea. 720 tablet 3  . estradiol (ESTRACE) 1 MG tablet Take 5 mg by mouth daily.     . fenofibrate 160 MG tablet Take 1 tablet (160 mg total) by mouth daily. 30 tablet 2  . gabapentin (NEURONTIN) 100 MG capsule 2 po tid for pain 540 capsule 3  . losartan (COZAAR) 50 MG tablet Take 1 tablet (50 mg total) by mouth daily. Needs appt. 90 tablet 3  . nitroGLYCERIN (NITROSTAT) 0.4 MG SL tablet Place 1 tablet (0.4 mg total) under the tongue every 5 (five) minutes x 3 doses as needed for chest pain. 25 tablet 5  . nortriptyline (PAMELOR) 10 MG capsule Take 5 capsules (50 mg total) by mouth at bedtime. 450 capsule 1  . predniSONE 5 MG/5ML solution Take by mouth daily with breakfast. Used as EYE DROPS 1%    . zolpidem (AMBIEN) 10 MG tablet Take 1 tablet (10 mg total) by mouth at bedtime as needed for sleep. 90 tablet 1   No current facility-administered medications on file prior to visit.     Review of Systems:  As per HPI- otherwise negative.   Physical Examination: Vitals:   01/13/16 1117  BP: 127/77  Pulse: 77  Resp: 16  Temp: 98.3 F (36.8 C)   Vitals:   01/13/16 1117  Weight: 205 lb 9.6 oz (93.3 kg)  Height: 5\' 3"  (1.6 m)   Body mass index is 36.42  kg/m. Ideal Body Weight: Weight in (lb) to have BMI = 25: 140.8  GEN: WDWN, NAD, Non-toxic, A & O x 3, obese, looks well HEENT: Atraumatic, Normocephalic. Neck supple. No masses, No LAD. Ears and Nose: No external deformity. CV: RRR, No M/G/R. No JVD. No thrill. No extra heart sounds. PULM: CTA B, no wheezes, crackles, rhonchi. No retractions. No resp. distress. No accessory muscle use. ABD: S, ND, +BS. No rebound. No HSM.  She has mild LLQ tenderness on exam EXTR: No c/c/e NEURO Normal gait.  PSYCH: Normally interactive. Conversant. Not depressed or anxious appearing.  Calm demeanor.  Pelvic: normal for age, no vaginal lesions or discharge. The posterior introitus is more atrophic and a bit tender- the rest of her vaginal tissue is in good health.   S/p hysterectomy  Results for orders placed or performed in visit on 01/13/16  CBC  Result Value Ref Range   WBC 9.5 4.0 - 10.5 K/uL   RBC 4.80 3.87 - 5.11 Mil/uL   Platelets 318.0 150.0 - 400.0 K/uL   Hemoglobin 14.3 12.0 - 15.0 g/dL   HCT 43.0 36.0 - 46.0 %   MCV 89.7 78.0 - 100.0 fl   MCHC 33.3 30.0 - 36.0 g/dL   RDW 16.2 (H) 11.5 - 15.5 %  Comprehensive metabolic panel  Result Value Ref Range   Sodium 138 135 - 145 mEq/L   Potassium 4.4 3.5 - 5.1 mEq/L   Chloride 103 96 - 112 mEq/L   CO2 28 19 - 32 mEq/L   Glucose, Bld 109 (H) 70 - 99 mg/dL   BUN 18 6 - 23 mg/dL   Creatinine, Ser 0.88 0.40 - 1.20 mg/dL  Total Bilirubin 0.3 0.2 - 1.2 mg/dL   Alkaline Phosphatase 53 39 - 117 U/L   AST 20 0 - 37 U/L   ALT 16 0 - 35 U/L   Total Protein 7.4 6.0 - 8.3 g/dL   Albumin 4.1 3.5 - 5.2 g/dL   Calcium 9.8 8.4 - 10.5 mg/dL   GFR 67.38 >60.00 mL/min  POCT Urinalysis Dipstick (Automated)  Result Value Ref Range   Color, UA yellow    Clarity, UA clear    Glucose, UA 1+    Bilirubin, UA neg    Ketones, UA neg    Spec Grav, UA 1.020    Blood, UA neg    pH, UA 6.5    Protein, UA neg    Urobilinogen, UA negative    Nitrite, UA neg     Leukocytes, UA Negative Negative   Assessment and Plan: Dysuria - Plan: POCT Urinalysis Dipstick (Automated), Urine culture, CANCELED: POCT urinalysis dipstick  Abdominal pain, left lower quadrant - Plan: amoxicillin-clavulanate (AUGMENTIN) 875-125 MG tablet, CBC, Comprehensive metabolic panel  Vaginal discomfort - Plan: Cervicovaginal ancillary only, GC/Chlamydia Probe Amp  History of diverticulitis - Plan: amoxicillin-clavulanate (AUGMENTIN) 875-125 MG tablet  High risk sexual behavior - Plan: GC/Chlamydia Probe Amp  Here today with a couple of concerns.  She has noted a feeling of discomfort in her bladder, vagina and lower abdominal area.  The exact cause is uncertain.  Doubt STI or yeast vaginitis but these tests are pending. Also urine appears benign but we will check a culture.  She has some atrophy of the introitus which will probably be improved with topical estrogen.  She does have a history of diverticulitis and has some LLQ tenderness.  Advised her to start a diverticulitis diet an will start on augmentin while we await the rest of her labs  We will check your urine, vaginal yeast infection test and gonorrhea/ chlamydia tests for you We will also check blood work today I suspect you have a diverticulitis flare- use the Augmentin antibiotic as directed for 10 days, and stick to either liquids or a soft, bland diet for the next 1-3 days.   If you are getting worse, have a fever or any other concerns please let me know! Use some of your premarin cream on the vaginal opening to ease your discomfort- you can use this daily for 1-2 weeks, and then taper down to a few times a day as needed    Signed Lamar Blinks, MD  Received the rest of her labs - it looks like she has BV.  Will send in an rx for flagyl for her.    Results for orders placed or performed in visit on 01/13/16  GC/Chlamydia Probe Amp  Result Value Ref Range   CT Probe RNA NOT DETECTED    GC Probe RNA NOT  DETECTED   Urine culture  Result Value Ref Range   Organism ID, Bacteria      Single organism less than 10,000 CFU/mL isolated. These organisms,commonly found on external and internal genitalia,are considered colonizers. No further testing performed.   CBC  Result Value Ref Range   WBC 9.5 4.0 - 10.5 K/uL   RBC 4.80 3.87 - 5.11 Mil/uL   Platelets 318.0 150.0 - 400.0 K/uL   Hemoglobin 14.3 12.0 - 15.0 g/dL   HCT 43.0 36.0 - 46.0 %   MCV 89.7 78.0 - 100.0 fl   MCHC 33.3 30.0 - 36.0 g/dL   RDW 16.2 (H) 11.5 -  15.5 %  Comprehensive metabolic panel  Result Value Ref Range   Sodium 138 135 - 145 mEq/L   Potassium 4.4 3.5 - 5.1 mEq/L   Chloride 103 96 - 112 mEq/L   CO2 28 19 - 32 mEq/L   Glucose, Bld 109 (H) 70 - 99 mg/dL   BUN 18 6 - 23 mg/dL   Creatinine, Ser 0.88 0.40 - 1.20 mg/dL   Total Bilirubin 0.3 0.2 - 1.2 mg/dL   Alkaline Phosphatase 53 39 - 117 U/L   AST 20 0 - 37 U/L   ALT 16 0 - 35 U/L   Total Protein 7.4 6.0 - 8.3 g/dL   Albumin 4.1 3.5 - 5.2 g/dL   Calcium 9.8 8.4 - 10.5 mg/dL   GFR 67.38 >60.00 mL/min  POCT Urinalysis Dipstick (Automated)  Result Value Ref Range   Color, UA yellow    Clarity, UA clear    Glucose, UA 1+    Bilirubin, UA neg    Ketones, UA neg    Spec Grav, UA 1.020    Blood, UA neg    pH, UA 6.5    Protein, UA neg    Urobilinogen, UA negative    Nitrite, UA neg    Leukocytes, UA Negative Negative  Cervicovaginal ancillary only  Result Value Ref Range   Wet Prep (BD Affirm) **POSITIVE for [place]** (A)

## 2016-01-14 LAB — GC/CHLAMYDIA PROBE AMP
CT PROBE, AMP APTIMA: NOT DETECTED
GC PROBE AMP APTIMA: NOT DETECTED

## 2016-01-14 LAB — CERVICOVAGINAL ANCILLARY ONLY: WET PREP (BD AFFIRM): POSITIVE — AB

## 2016-01-15 ENCOUNTER — Encounter: Payer: Self-pay | Admitting: Family Medicine

## 2016-01-15 LAB — URINE CULTURE

## 2016-01-15 MED ORDER — METRONIDAZOLE 500 MG PO TABS: 500.0000 mg | ORAL_TABLET | Freq: Two times a day (BID) | ORAL | 0 refills | Status: DC

## 2016-01-15 NOTE — Addendum Note (Signed)
Addended by: Lamar Blinks C on: 01/15/2016 04:53 PM   Modules accepted: Orders

## 2016-01-25 DIAGNOSIS — Z947 Corneal transplant status: Secondary | ICD-10-CM | POA: Diagnosis not present

## 2016-01-25 DIAGNOSIS — H52211 Irregular astigmatism, right eye: Secondary | ICD-10-CM | POA: Diagnosis not present

## 2016-02-17 ENCOUNTER — Ambulatory Visit: Payer: Medicare Other | Admitting: Nurse Practitioner

## 2016-02-22 DIAGNOSIS — H401131 Primary open-angle glaucoma, bilateral, mild stage: Secondary | ICD-10-CM | POA: Diagnosis not present

## 2016-02-28 DIAGNOSIS — Z96651 Presence of right artificial knee joint: Secondary | ICD-10-CM | POA: Diagnosis not present

## 2016-02-28 DIAGNOSIS — M1712 Unilateral primary osteoarthritis, left knee: Secondary | ICD-10-CM | POA: Diagnosis not present

## 2016-03-29 ENCOUNTER — Telehealth: Payer: Self-pay | Admitting: *Deleted

## 2016-03-29 NOTE — Telephone Encounter (Signed)
Requesting surgical clearance:   1. Type of surgery: Brachioplasty  2. Surgeon: Loletta Specter  3. Surgical date: Pending  4. Medications that need to be help: Plavix  5. [facility]: [phone]   You saw pt on 11/30/15, Is pt cleared for surgery and how can plavix be held?

## 2016-04-02 NOTE — Telephone Encounter (Signed)
Given her disease and the fact that she was going to have six month follow up I would like to have her seen in the office for preop clearance.

## 2016-04-04 ENCOUNTER — Telehealth: Payer: Self-pay | Admitting: Cardiology

## 2016-04-04 DIAGNOSIS — Z96651 Presence of right artificial knee joint: Secondary | ICD-10-CM | POA: Diagnosis not present

## 2016-04-04 DIAGNOSIS — M1712 Unilateral primary osteoarthritis, left knee: Secondary | ICD-10-CM | POA: Diagnosis not present

## 2016-04-04 NOTE — Telephone Encounter (Signed)
Follow Up   Pt called and could not talk at this time. Stated she will be available at [facility] today and to call back please.

## 2016-04-04 NOTE — Telephone Encounter (Signed)
Pt have appt schedule for March 12th @10 :15 am

## 2016-04-04 NOTE — Telephone Encounter (Signed)
Leave message for pt to call back 

## 2016-04-05 ENCOUNTER — Telehealth: Payer: Self-pay | Admitting: Cardiology

## 2016-04-05 NOTE — Telephone Encounter (Signed)
New message       Request for surgical clearance:  What type of surgery is being performed? brachioplasty 1. When is this surgery scheduled?  Pending clearance  2. Are there any medications that need to be held prior to surgery and how long? Hold plavix 7-10 days prior and restart 5-7 days after procedure  3. Name of physician performing surgery?  Dr Percell Miller  4. What is your office phone and fax number?  Fax [phone]

## 2016-04-08 NOTE — Telephone Encounter (Signed)
OK to hold Plavix as needed for the procedure.  Based on ACC/AHA guidelines, the patient would be at acceptable risk for the planned procedure without further cardiovascular testing.

## 2016-04-14 ENCOUNTER — Encounter: Payer: Self-pay | Admitting: Cardiology

## 2016-04-23 NOTE — Progress Notes (Signed)
HPI The patient presents for followup of her cardiomyopathy and coronary disease that we are managing medically. She had a non-Q-wave myocardial infarction in the past.  At that time her ejection fraction by echo was 30-35% though it was higher by cath. Follow up echo demonstrated her EF to be 60-65% in 2013.  She presents for follow-up.  At the last visit her BP was running low and I reduced her Coreg.  She feels less light headed with this reduced dose.   She is going to have some plastic surgery on her arms and she needs preoperative clearance. She has permission to hold Plavix. She walks on a treadmill. The patient denies any new symptoms such as chest discomfort, neck or arm discomfort. There has been no new shortness of breath, PND or orthopnea. There have been no reported palpitations, presyncope or syncope.   Allergies  Allergen Reactions  . Iohexol Rash    rash  . Iodine   . Statins   . Imdur [Isosorbide Nitrate]     Severe headaches  . Latex Rash  . Tape Rash and Other (See Comments)    Plastic tape  . Verapamil     REACTION: dizziness    Current Outpatient Prescriptions  Medication Sig Dispense Refill  . aspirin 81 MG tablet Take 81 mg by mouth daily.    . brimonidine-timolol (COMBIGAN) 0.2-0.5 % ophthalmic solution Apply to eye. 2 gtts L eye, 1 gtt R eye.    . carvedilol (COREG) 6.25 MG tablet Take 1 tablet (6.25 mg total) by mouth 2 (two) times daily. 180 tablet 3  . clobetasol ointment (TEMOVATE) 4.69 % Apply 1 application topically 2 (two) times daily. 90 g 3  . clopidogrel (PLAVIX) 75 MG tablet Take 1 tablet (75 mg total) by mouth daily with breakfast. 90 tablet 3  . diphenoxylate-atropine (LOMOTIL) 2.5-0.025 MG per tablet 1 to 2 tablets in the morning, then every 6 hours as needed for diarrhea. 720 tablet 3  . estradiol (ESTRACE) 1 MG tablet Take 5 mg by mouth daily.     . fenofibrate 160 MG tablet Take 1 tablet (160 mg total) by mouth daily. 30 tablet 2  .  gabapentin (NEURONTIN) 100 MG capsule 2 po tid for pain 540 capsule 3  . latanoprost (XALATAN) 0.005 % ophthalmic solution Apply 1 drop to eye at bedtime.    [name] losartan (COZAAR) 50 MG tablet Take 1 tablet (50 mg total) by mouth daily. Needs appt. 90 tablet 3  . nitroGLYCERIN (NITROSTAT) 0.4 MG SL tablet Place 1 tablet (0.4 mg total) under the tongue every 5 (five) minutes x 3 doses as needed for chest pain. 25 tablet 5  . nortriptyline (PAMELOR) 10 MG capsule Take 5 capsules (50 mg total) by mouth at bedtime. 450 capsule 1  . prednisoLONE acetate (PRED FORTE) 1 % ophthalmic suspension Place 1 drop into both eyes 2 (two) times daily.    [name] zolpidem (AMBIEN) 10 MG tablet Take 1 tablet (10 mg total) by mouth at bedtime as needed for sleep. 90 tablet 1   No current facility-administered medications for this visit.     Past Medical History:  Diagnosis Date  . Anxiety state    NOS  . Arthritis    osteo...right knee  . Back pain   . CAD (coronary artery disease)    NSTEMI 8/13 => LHC 09/27/11: oLM 50%, dLM 30%, oLAD 60-70%, pLAD 50%, mLAD 70%, pD1 30%, pD2 40%, oRCA 25%, mRCA 40%, EF  50% with ant HK => LAD not amenable to PCI => med Rx  unless recurrent angina = > consider CABG  . Carpal tunnel syndrome   . Corneal disorder   . Diverticular disease   . Dysmetabolic syndrome X   . GERD (gastroesophageal reflux disease)   . 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   [name] Headache, paroxysmal hemicrania, chronic 07/23/2012    Referral to BOTOX evaluation with dr Rexene Alberts or Krista Blue .  [name] Heart attack 09/2011  . Hernia    hiatal  . HLD (hyperlipidemia)   . Hx of adenomatous colonic polyps   . Hypertension    essen. NOS  . Ischemic cardiomyopathy    a.  Echo 09/29/11: EF 30-35% with inferior, posterior, lateral AK, anterior severe HK, mild LVH, mild MR, PASP 40;  => b. follow up echo 10/13:  EF 55-60%, Gr 1 diast dysfn, mild MR  .  Migraine headache without aura 07/23/2012    Left sided  Lamonte Sakai thought to be migrainous  , hypersensitivity to touch , photophobia ,  no nausea.   [name] Postmenopausal status   . Shoulder fracture   . Sleep apnea    CPAP    Past Surgical History:  Procedure Laterality Date  . ABDOMINAL HYSTERECTOMY    . ankle cystectomy Right   . APPENDECTOMY    . BREAST LUMPECTOMY Right 1996  . BUNIONECTOMY Right   . CARDIAC CATHETERIZATION  09/28/2011   Dr Acie Fredrickson(  to see Dr Ron Parker)  . CHOLECYSTECTOMY    . COLONOSCOPY W/ POLYPECTOMY    . 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 CORNEAL TRANSPLANT Left    x1  . HEMORRHOID SURGERY  12/01/2010, 2014   [place] hemorrhoid ligation/pexy  . LEFT HEART CATHETERIZATION WITH CORONARY ANGIOGRAM N/A 09/28/2011   Procedure: LEFT HEART CATHETERIZATION WITH CORONARY ANGIOGRAM;  Surgeon: Minus Breeding, MD;  Location: [facility] CATH LAB;  Service: Cardiovascular;  Laterality: N/A;  . lumbar spur removed    . rectocele surgery    . TOTAL KNEE ARTHROPLASTY Right     ROS:    As stated in the HPI and negative for all other systems.  PHYSICAL EXAM BP 140/88 (BP Location: Right Arm, Patient Position: Sitting, Cuff Size: Large)   Pulse 66   Ht 5\' 3"  (1.6 m)   Wt 214 lb (97.1 kg)   BMI 37.91 kg/m  GENERAL:  Well appearing HEENT:  Pupils equal round and reactive, fundi not visualized, oral mucosa unremarkable NECK:  No jugular venous distention, waveform within normal limits, carotid upstroke brisk and symmetric, no bruits, no thyromegaly LUNGS:  Clear to auscultation bilaterally CHEST:  Unremarkable HEART:  PMI not displaced or sustained,S1 and S2 within normal limits, no S3, no S4, no clicks, no rubs, no murmurs ABD:  Flat, positive bowel sounds normal in frequency in pitch, no bruits, no rebound, no guarding, no midline pulsatile mass, no hepatomegaly, no splenomegaly EXT:  2 plus pulses throughout, no edema, no cyanosis no  clubbing  EKG:  Sinus rhythm, rate 66, axis within normal limits, intervals within normal limits, no acute ST-T wave changes.04/24/2016  Lab Results  Component Value Date   CHOL 192 12/16/2015   TRIG 160.0 (H) 12/16/2015   HDL 59.30 12/16/2015   LDLCALC 100 (H) 12/16/2015   LDLDIRECT 140.8 03/18/2013    ASSESSMENT AND PLAN  Coronary Artery Disease:  The patient has no new sypmtoms.  We will continue with aggressive risk reduction and meds as listed.    Preop: I will bring the patient back for a POET (Plain Old Exercise Test). This will allow me to screen for obstructive coronary disease, risk stratify and very importantly provide a prescription for exercise.  She can hold her Plavix for 5 days prior to surgery.  I will complete clearance once she has her stress test.   Ischemic Cardiomyopathy:  She is breathing OK.  No change in therapy is planned.    Hyperlipidemia:  LDL as above.  She is intolerant of statins.  Hypertension:    At the last visit her BP was low and I reduced her Coreg.   She is doing well on this dose and will continue.

## 2016-04-24 ENCOUNTER — Ambulatory Visit (INDEPENDENT_AMBULATORY_CARE_PROVIDER_SITE_OTHER): Payer: Medicare Other | Admitting: Cardiology

## 2016-04-24 ENCOUNTER — Encounter: Payer: Self-pay | Admitting: Cardiology

## 2016-04-24 VITALS — BP 140/88 | HR 66 | Ht 63.0 in | Wt 214.0 lb

## 2016-04-24 DIAGNOSIS — I255 Ischemic cardiomyopathy: Secondary | ICD-10-CM | POA: Diagnosis not present

## 2016-04-24 DIAGNOSIS — I2589 Other forms of chronic ischemic heart disease: Secondary | ICD-10-CM

## 2016-04-24 DIAGNOSIS — Z01810 Encounter for preprocedural cardiovascular examination: Secondary | ICD-10-CM

## 2016-04-24 DIAGNOSIS — R42 Dizziness and giddiness: Secondary | ICD-10-CM | POA: Diagnosis not present

## 2016-04-24 NOTE — Telephone Encounter (Signed)
Pt having GXT done

## 2016-04-24 NOTE — Patient Instructions (Addendum)
Medication Instructions:  Continue current medications  Labwork: None Ordered  Testing/Procedures: Your physician has requested that you have an exercise tolerance test. For further information please visit [email or web address]. Please also follow instruction sheet, as given.   Follow-Up: Your physician wants you to follow-up in: 1 Year. You will receive a reminder letter in the mail two months in advance. If you don't receive a letter, please call our office to schedule the follow-up appointment.   Any Other Special Instructions Will Be Listed Below (If Applicable).  Happy [place] Birthday!!!!   If you need a refill on your cardiac medications before your next appointment, please call your pharmacy.

## 2016-05-01 ENCOUNTER — Telehealth: Payer: Self-pay | Admitting: Cardiology

## 2016-05-01 NOTE — Telephone Encounter (Signed)
New message    Pt verbalized that she is calling because she wants to know if her EKG   and if surgical clearance was submitted to her plastic surgeons office ( Dr. August Albino) number [phone] fax [phone]

## 2016-05-02 HISTORY — PX: BRACHIOPLASTY: SUR162

## 2016-05-02 NOTE — Telephone Encounter (Signed)
Encounter complete. 

## 2016-05-04 ENCOUNTER — Encounter: Payer: Self-pay | Admitting: Neurology

## 2016-05-04 ENCOUNTER — Ambulatory Visit (HOSPITAL_COMMUNITY)
Admission: RE | Admit: 2016-05-04 | Discharge: 2016-05-04 | Disposition: A | Discharge status: Home / Self Care | Payer: Medicare Other | Source: Ambulatory Visit | Attending: Cardiology | Admitting: Cardiology

## 2016-05-04 ENCOUNTER — Ambulatory Visit (INDEPENDENT_AMBULATORY_CARE_PROVIDER_SITE_OTHER): Payer: Medicare Other | Admitting: Neurology

## 2016-05-04 VITALS — BP 140/81 | HR 97 | Resp 20 | Ht 63.0 in | Wt 214.0 lb

## 2016-05-04 DIAGNOSIS — Z9989 Dependence on other enabling machines and devices: Secondary | ICD-10-CM | POA: Diagnosis not present

## 2016-05-04 DIAGNOSIS — G43109 Migraine with aura, not intractable, without status migrainosus: Secondary | ICD-10-CM | POA: Diagnosis not present

## 2016-05-04 DIAGNOSIS — E669 Obesity, unspecified: Secondary | ICD-10-CM

## 2016-05-04 DIAGNOSIS — G4440 Drug-induced headache, not elsewhere classified, not intractable: Secondary | ICD-10-CM

## 2016-05-04 DIAGNOSIS — T3995XA Adverse effect of unspecified nonopioid analgesic, antipyretic and antirheumatic, initial encounter: Secondary | ICD-10-CM

## 2016-05-04 DIAGNOSIS — I255 Ischemic cardiomyopathy: Secondary | ICD-10-CM | POA: Diagnosis not present

## 2016-05-04 DIAGNOSIS — G4733 Obstructive sleep apnea (adult) (pediatric): Secondary | ICD-10-CM

## 2016-05-04 DIAGNOSIS — G44009 Cluster headache syndrome, unspecified, not intractable: Secondary | ICD-10-CM | POA: Diagnosis not present

## 2016-05-04 DIAGNOSIS — R42 Dizziness and giddiness: Secondary | ICD-10-CM

## 2016-05-04 LAB — EXERCISE TOLERANCE TEST
CSEPED: 4 min
CSEPEDS: 15 s
CSEPEW: 6 METS
CSEPPHR: 153 {beats}/min
MPHR: 149 {beats}/min
Percent HR: 102 %
RPE: 18
Rest HR: 77 {beats}/min

## 2016-05-04 MED ORDER — NORTRIPTYLINE HCL 10 MG PO CAPS: 50.0000 mg | ORAL_CAPSULE | Freq: Every day | ORAL | 1 refills | Status: DC

## 2016-05-04 MED ORDER — ZOLPIDEM TARTRATE 10 MG PO TABS
10.0000 mg | ORAL_TABLET | Freq: Every evening | ORAL | 1 refills | Status: DC | PRN
Start: 2016-05-04 — End: 2016-10-03

## 2016-05-04 NOTE — Progress Notes (Signed)
[facility]  PATIENT: Connie Owens DOB: May 30, 1945   REASON FOR VISIT: Follow-up for migraine HISTORY FROM: Patient  CM; Connie Owens is a 71 year old female with history of OSA on CPAP and migraines. She returns today for follow-up. She states that her headaches have improved on nortriptyline. She states that she has approximately one headache a week. These headaches, last 30-40 minutes. They normally occur on the left side of the head. Denies photophobia or phonophobia. Denies nausea or vomiting. She states that if she relaxes and tries to rest her headache resolves quickly. She states that she uses the CPAP nightly. 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 HISTORY 04/30/14: Connie Owens is a 71 year old female with history of obstructive sleep Apnea and migraines. She returns today for a 90 day compliance download. She brought her machine with her today and the reports shows an AHI of 0.3at 6 cm of water, uses her machine for 5 hours and 57 minutes a night, with 100 % compliance. She uses her machine for greater than 4 hours 74 out of 90 days with compliance of 82%. Her Epworth score is 7 points was previously 4 points. Her fatigue severity score is 41 was previously 53.. Patient reports that she gets about 9 hours of sleep a night. She goes to bed around 11PM and arises at 8:15AM. She denies having trouble falling asleep or staying a sleep. States that the she gets up about 1-2 times a night to urinate. Overall patient feels that CPAP has improved her sleepiness and fatigue. The patient states that her migraines have improved. She continues to take nortriptyline 30 mg at bedtime. She has approximately 1-2 headaches per week. She will occassionally have sharp pain that is on the left side of the head that last for seconds and then will resolve. Overall she feels that her headaches have improved. Patient continues to  suffer from anxiety and depression due to the loss of her husband. They were married 98 years.  Interval history from 03/22/2017CD Connie Owens is here today doing better in terms of her migraine headaches without aura and she seems to tolerate amitriptyline very well, but she does have morning dry mouth and she has a history of glaucoma which needs to be considered. In addition we are here to have a compliance visit for CPAP and her overall compliance is 87% but she had several days was less than 4 hours of nightly use. She still needs Ambien , now 18 month after the death of her spouse, this may helped with headache.  09/02/15 CM Connie Owens, 71 year old female returns for follow-up. She is here because her migraines have worsened. She has been taking Excedrin Migraine on a frequent basis. She says that heat  is a trigger for her headaches and we are having 90 days . Her preventive  is nortriptyline 40 mg at night. In addition she has obstructive apnea and uses CPAP approximately 4 hours at night. Her headache today on a pain scale of 1-10 is a 3-4. She is not aware of foods that are specific  Triggers. Headaches 2-3 times a week lasting several hours .She returns for reevaluation.  Interval history from 05/04/2016, The pleasure of seeing Connie Owens today for revisit or has been more sparingly using Excedrin Migraine and still has headaches. She does not report excessive daytime sleepiness given that she endorsed the Epworth score at  7 points, fatigue severity at 25 points, and geriatric depression score at only 1 out of 15 points. She is using CPAP for the treatment of obstructive sleep apnea at a very low pressure of 6 cm water with a residual AHI of 0.5 and a compliance of 75% 100% 4 days 75% 4 hours of daily use was 5 hours 58 minutes on average. The needs to be no adjustments made to her CPAP except that I would appreciate if she can try to use it at least 4 hours each night. CPAP has helped her  headaches. She has signed up with a local gym and is trying to lose weight by diet and exercise.      REVIEW OF SYSTEMS: Full 14 system review of systems performed and notable only for those listed, all others are neg:   corneal transplant, OSA on CPAP, headaches. Obesity.   ALLERGIES: Allergies  Allergen Reactions  . Iohexol Rash    rash  . Iodine   . Statins   . Imdur [Isosorbide Nitrate]     Severe headaches  . Latex Rash  . Tape Rash and Other (See Comments)    Plastic tape  . Verapamil     REACTION: dizziness    HOME MEDICATIONS: Outpatient Medications Prior to Visit  Medication Sig Dispense Refill  . aspirin 81 MG tablet Take 81 mg by mouth daily.    . brimonidine-timolol (COMBIGAN) 0.2-0.5 % ophthalmic solution Apply to eye. 2 gtts L eye, 1 gtt R eye.    . carvedilol (COREG) 6.25 MG tablet Take 1 tablet (6.25 mg total) by mouth 2 (two) times daily. 180 tablet 3  . clobetasol ointment (TEMOVATE) 5.85 % Apply 1 application topically 2 (two) times daily. 90 g 3  . clopidogrel (PLAVIX) 75 MG tablet Take 1 tablet (75 mg total) by mouth daily with breakfast. 90 tablet 3  . diphenoxylate-atropine (LOMOTIL) 2.5-0.025 MG per tablet 1 to 2 tablets in the morning, then every 6 hours as needed for diarrhea. 720 tablet 3  . estradiol (ESTRACE) 1 MG tablet Take 5 mg by mouth daily.     . fenofibrate 160 MG tablet Take 1 tablet (160 mg total) by mouth daily. 30 tablet 2  . gabapentin (NEURONTIN) 100 MG capsule 2 po tid for pain 540 capsule 3  . latanoprost (XALATAN) 0.005 % ophthalmic solution Apply 1 drop to eye at bedtime.    Connie Owens losartan (COZAAR) 50 MG tablet Take 1 tablet (50 mg total) by mouth daily. Needs appt. 90 tablet 3  . nitroGLYCERIN (NITROSTAT) 0.4 MG SL tablet Place 1 tablet (0.4 mg total) under the tongue every 5 (five) minutes x 3 doses as needed for chest pain. 25 tablet 5  . nortriptyline (PAMELOR) 10 MG capsule Take 5 capsules (50 mg total) by mouth at bedtime. 450  capsule 1  . prednisoLONE acetate (PRED FORTE) 1 % ophthalmic suspension Place 1 drop into both eyes 2 (two) times daily.    Connie Owens zolpidem (AMBIEN) 10 MG tablet Take 1 tablet (10 mg total) by mouth at bedtime as needed for sleep. 90 tablet 1   No facility-administered medications prior to visit.     PAST MEDICAL HISTORY: Past Medical History:  Diagnosis Date  . Anxiety state    NOS  . Arthritis    osteo...right knee  . Back pain   . CAD (coronary artery disease)    NSTEMI 8/13 => LHC 09/27/11: oLM 50%, dLM 30%, oLAD 60-70%, pLAD 50%, mLAD 70%,  pD1 30%, pD2 40%, oRCA 25%, mRCA 40%, EF 50% with ant HK => LAD not amenable to PCI => med Rx  unless recurrent angina = > consider CABG  . Carpal tunnel syndrome   . Corneal disorder   . Diverticular disease   . Dysmetabolic syndrome X   . GERD (gastroesophageal reflux disease)   . 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   Connie Owens Headache, paroxysmal hemicrania, chronic 07/23/2012    Referral to BOTOX evaluation with dr Rexene Alberts or Krista Blue .  Connie Owens Heart attack 09/2011  . Hernia    hiatal  . HLD (hyperlipidemia)   . Hx of adenomatous colonic polyps   . Hypertension    essen. NOS  . Ischemic cardiomyopathy    a.  Echo 09/29/11: EF 30-35% with inferior, posterior, lateral AK, anterior severe HK, mild LVH, mild MR, PASP 40;  => b. follow up echo 10/13:  EF 55-60%, Gr 1 diast dysfn, mild MR  . Migraine headache without aura 07/23/2012    Left sided  Lamonte Sakai thought to be migrainous  , hypersensitivity to touch , photophobia ,  no nausea.   Connie Owens Postmenopausal status   . Shoulder fracture   . Sleep apnea    CPAP    PAST SURGICAL HISTORY: Past Surgical History:  Procedure Laterality Date  . ABDOMINAL HYSTERECTOMY    . ankle cystectomy Right   . APPENDECTOMY    . BREAST LUMPECTOMY Right 1996  . BUNIONECTOMY Right   . CARDIAC CATHETERIZATION  09/28/2011   Dr Acie Fredrickson(  to see Dr Ron Parker)  .  CHOLECYSTECTOMY    . COLONOSCOPY W/ POLYPECTOMY    . 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 CORNEAL TRANSPLANT Left    x1  . HEMORRHOID SURGERY  12/01/2010, 2014   [place] hemorrhoid ligation/pexy  . LEFT HEART CATHETERIZATION WITH CORONARY ANGIOGRAM N/A 09/28/2011   Procedure: LEFT HEART CATHETERIZATION WITH CORONARY ANGIOGRAM;  Surgeon: Minus Breeding, MD;  Location: [facility] CATH LAB;  Service: Cardiovascular;  Laterality: N/A;  . lumbar spur removed    . rectocele surgery    . TOTAL KNEE ARTHROPLASTY Right     FAMILY HISTORY: Family History  Problem Relation Age of Onset  . Breast cancer Mother   . Heart disease Father   . Stroke Father   . Dementia Father   . Prostate cancer Father   . Diabetes Sister     3 sisters   . Heart disease Sister     1 sister CABG  . Breast cancer Sister     PTE pre & post Dx of ca  . Diabetes Paternal Grandfather   . Breast cancer Sister   . Colon cancer Neg Hx     SOCIAL HISTORY: Social History   Social History  . Marital status: Widowed    Spouse name: Ruthann Cancer  . Number of children: 3  . Years of education: 14   Occupational History  . retired Not Employed  .  Retired   Social History Main Topics  . Smoking status: Never Smoker  . Smokeless tobacco: Never Used  . Alcohol use Yes     Comment: rare  . Drug use: No  . Sexual activity: Not on file   Other Topics Concern  . Not on file   Social History Narrative   Patient is widowed,([place] past 10/02/13) and lives at home with her  husband.   Patient has three adult children.   Patient has a college education.   Patient is right-handed.   Patient drinks two cups of coffee daily.   Regular exercise   Patient is retired.     PHYSICAL EXAM  Vitals:   05/04/16 1316  BP: 140/81  Pulse: 97  Resp: 20  Weight: 214 lb (97.1 kg)  Height: 5\' 3"  (1.6 m)   Body mass index is 37.91 kg/m. Generalized: Well developed, in no acute distress ,  super-obese  caucasian female Neurological examination  Mentation: Alert oriented to time, place, history taking. Follows all commands, her  speech and language are fluent Cranial nerve : Pupils were equal round reactive to light.No nystagmus, no diplopia.   Extraocular movements were full, visual field were full on confrontational test. Facial sensation and strength were normal. Uvula tongue midline. Head turning intact,  normal and symmetric. Motor:  5 /5 strength of all 4 extremities, symmetric motor tone is noted throughout.  Right knee replacement  Has helped pain but she has not regained the normal ROM.  Sensory:  intact to soft touch on all 4 extremities. Coordination: Cerebellar testing reveals intact finger-nose-finger bilaterally.  Gait and station: Gait with normal erect posture.   Reflexes: Deep tendon reflexes are symmetric bilaterally.  , DIAGNOSTIC DATA (LABS, IMAGING, TESTING) - I reviewed patient records, labs, notes, testing and imaging myself where available.   ASSESSMENT AND PLAN  71 y.o. year old female  has a past medical history of CAD (coronary artery disease); Ischemic cardiomyopathy; Corneal disorder; Dysmetabolic syndrome X; Arthritis; Carpal tunnel syndrome;  Diverticular disease;  Anxiety state; Hypertension;  HLD (hyperlipidemia); Migraine headache without aura (07/23/2012); Headache, paroxysmal hemicrania, chronic (07/23/2012); Heart attack ([place]) (09/2011); and Glaucoma (increased eye pressure) (07/30/2013). here To follow-up for her migraine headache which is increased. It is a 3 out of 4 on a scale of 1-10  PLAN: Increase nortriptyline to 5 capsules at night.  Use CPAP every night, no adjustment needed. Has improved compliance to 75%.   Weight loss - the use of nortriptyline may interfere with her weight loss plans. It has helped her to sleep deeper it has helped her with the headache frequency. I hope that he could reduce it from 5 capsules at night to 4 or 3  and see if this is working to reduce her appetite. The headaches have improved under CPAP use and since she has weaned off Excedrin. She understood the mechanism of rebound headaches.   Keep Follow-up with Jinny Blossom, NP in [place] month  Avery Eustice, MD   [facility] [address] [place], [place] [address] [phone]

## 2016-05-04 NOTE — Patient Instructions (Signed)
Analgesic Rebound Headache An analgesic rebound headache, sometimes called a medication overuse headache, is a headache that comes after pain medicine (analgesic) taken to treat the original (primary) headache has worn off. Any type of primary headache can return as a rebound headache if a person regularly takes analgesics more than three times a week to treat it. The types of primary headaches that are commonly associated with rebound headaches include:  Migraines.  Headaches that arise from tense muscles in the head and neck area (tension headaches).  Headaches that develop and happen again (recur) on one side of the head and around the eye (cluster headaches). If rebound headaches continue, they become chronic daily headaches. What are the causes? This condition may be caused by frequent use of:  Over-the-counter medicines such as aspirin, ibuprofen, and acetaminophen.  Sinus relief medicines and other medicines that contain caffeine.  Narcotic pain medicines such as codeine and oxycodone. What are the signs or symptoms? The symptoms of a rebound headache are the same as the symptoms of the original headache. Some of the symptoms of specific types of headaches include: Migraine headache   Pulsing or throbbing pain on one or both sides of the head.  Severe pain that interferes with daily activities.  Pain that is worsened by physical activity.  Nausea, vomiting, or both.  Pain with exposure to bright light, loud noises, or strong smells.  General sensitivity to bright light, loud noises, or strong smells.  Visual changes.  Numbness of one or both arms. Tension headache   Pressure around the head.  Dull, aching head pain.  Pain felt over the front and sides of the head.  Tenderness in the muscles of the head, neck, and shoulders. Cluster headache   Severe pain that begins in or around one eye or temple.  Redness and tearing in the eye on the same side as the  pain.  Droopy or swollen eyelid.  One-sided head pain.  Nausea.  Runny nose.  Sweaty, pale facial skin.  Restlessness. How is this diagnosed? This condition is diagnosed by:  Reviewing your medical history. This includes the nature of your primary headaches.  Reviewing the types of pain medicines that you have been using to treat your headaches and how often you take them. How is this treated? This condition may be treated or managed by:  Discontinuing frequent use of the analgesic medicine. Doing this may worsen your headaches at first, but the pain should eventually become more manageable, less frequent, and less severe.  Seeing a headache specialist. He or she may be able to help you manage your headaches and help make sure there is not another cause of the headaches.  Using methods of stress relief, such as acupuncture, counseling, biofeedback, and massage. Talk with your health care provider about which methods might be good for you. Follow these instructions at home:  Take over-the-counter and prescription medicines only as told by your health care provider.  Stop the repeated use of pain medicine as told by your health care provider. Stopping can be difficult. Carefully follow instructions from your health care provider.  Avoid triggers that are known to cause your primary headaches.  Keep all follow-up visits as told by your health care provider. This is important. Contact a health care provider if:  You continue to experience headaches after following treatments that your health care provider recommended. Get help right away if:  You develop new headache pain.  You develop headache pain that is different than  what you have experienced in the past.  You develop numbness or tingling in your arms or legs.  You develop changes in your speech or vision. This information is not intended to replace advice given to you by your health care provider. Make sure you  discuss any questions you have with your health care provider. Document Released: 04/22/2003 Document Revised: 08/20/2015 Document Reviewed: 07/05/2015 Elsevier Interactive Patient Education  2017 [employer].

## 2016-06-12 ENCOUNTER — Other Ambulatory Visit: Payer: Self-pay | Admitting: Obstetrics and Gynecology

## 2016-06-12 DIAGNOSIS — Z1231 Encounter for screening mammogram for malignant neoplasm of breast: Secondary | ICD-10-CM

## 2016-06-19 ENCOUNTER — Ambulatory Visit
Admission: RE | Admit: 2016-06-19 | Discharge: 2016-06-19 | Disposition: A | Discharge status: Home / Self Care | Payer: Medicare Other | Source: Ambulatory Visit | Attending: Obstetrics and Gynecology | Admitting: Obstetrics and Gynecology

## 2016-06-19 DIAGNOSIS — Z1231 Encounter for screening mammogram for malignant neoplasm of breast: Secondary | ICD-10-CM

## 2016-07-03 ENCOUNTER — Other Ambulatory Visit: Payer: Self-pay

## 2016-07-03 DIAGNOSIS — Z96651 Presence of right artificial knee joint: Secondary | ICD-10-CM | POA: Diagnosis not present

## 2016-07-03 DIAGNOSIS — M25512 Pain in left shoulder: Secondary | ICD-10-CM | POA: Diagnosis not present

## 2016-07-03 DIAGNOSIS — M5441 Lumbago with sciatica, right side: Secondary | ICD-10-CM | POA: Diagnosis not present

## 2016-07-03 DIAGNOSIS — G8929 Other chronic pain: Secondary | ICD-10-CM | POA: Diagnosis not present

## 2016-07-03 DIAGNOSIS — Z471 Aftercare following joint replacement surgery: Secondary | ICD-10-CM | POA: Diagnosis not present

## 2016-07-03 DIAGNOSIS — M25561 Pain in right knee: Secondary | ICD-10-CM | POA: Diagnosis not present

## 2016-07-03 NOTE — Telephone Encounter (Signed)
I called pt to discuss. I have a feeling that Dr. Brett Fairy gave the pt the RX for ambien at her visit on 05/04/2016.  No answer, left a message asking her to call me back.

## 2016-07-03 NOTE — Telephone Encounter (Signed)
Pt returned my call, she says that she sent the RX given to her by Dr. Brett Fairy on 05/04/2016 to her mail order pharmacy and asked me to cancel the request from [facility].

## 2016-07-03 NOTE — Telephone Encounter (Signed)
Last filled 05-04-2016 for 90 days. No yet due for refill. CD

## 2016-07-17 DIAGNOSIS — H401131 Primary open-angle glaucoma, bilateral, mild stage: Secondary | ICD-10-CM | POA: Diagnosis not present

## 2016-08-14 DIAGNOSIS — M5441 Lumbago with sciatica, right side: Secondary | ICD-10-CM | POA: Diagnosis not present

## 2016-08-29 DIAGNOSIS — M5441 Lumbago with sciatica, right side: Secondary | ICD-10-CM | POA: Diagnosis not present

## 2016-09-02 DIAGNOSIS — M5441 Lumbago with sciatica, right side: Secondary | ICD-10-CM | POA: Diagnosis not present

## 2016-09-07 DIAGNOSIS — M5441 Lumbago with sciatica, right side: Secondary | ICD-10-CM | POA: Diagnosis not present

## 2016-09-20 ENCOUNTER — Other Ambulatory Visit: Payer: Self-pay | Admitting: Orthopedic Surgery

## 2016-09-20 DIAGNOSIS — M5441 Lumbago with sciatica, right side: Secondary | ICD-10-CM

## 2016-09-26 ENCOUNTER — Telehealth: Payer: Self-pay | Admitting: *Deleted

## 2016-09-26 NOTE — Telephone Encounter (Signed)
Requesting surgical clearance:   1. Type of surgery: Lumbar Myelogram  2. Surgeon: Unknown  3. Surgical date: Pending  4. Medications that need to be held: Plavix for 5 days  5. Yaak: [phone]   Is pt cleared for for?

## 2016-09-27 NOTE — Telephone Encounter (Signed)
Pt is calling to check on the status of her clearance,she says she is in pain.She would like this asap please.i

## 2016-09-27 NOTE — Telephone Encounter (Signed)
Low risk for her procedure.  OK to hold Plavix.

## 2016-09-28 NOTE — Telephone Encounter (Signed)
Clearance faxed to [facility] via [place]

## 2016-10-03 ENCOUNTER — Encounter: Payer: Self-pay | Admitting: Adult Health

## 2016-10-03 ENCOUNTER — Ambulatory Visit (INDEPENDENT_AMBULATORY_CARE_PROVIDER_SITE_OTHER): Payer: Medicare Other | Admitting: Adult Health

## 2016-10-03 VITALS — BP 126/80 | HR 70 | Ht 63.0 in | Wt 211.6 lb

## 2016-10-03 DIAGNOSIS — Z9989 Dependence on other enabling machines and devices: Secondary | ICD-10-CM | POA: Diagnosis not present

## 2016-10-03 DIAGNOSIS — G4733 Obstructive sleep apnea (adult) (pediatric): Secondary | ICD-10-CM | POA: Diagnosis not present

## 2016-10-03 DIAGNOSIS — I255 Ischemic cardiomyopathy: Secondary | ICD-10-CM | POA: Diagnosis not present

## 2016-10-03 DIAGNOSIS — R51 Headache: Secondary | ICD-10-CM

## 2016-10-03 DIAGNOSIS — R519 Headache, unspecified: Secondary | ICD-10-CM

## 2016-10-03 MED ORDER — NORTRIPTYLINE HCL 10 MG PO CAPS: 50.0000 mg | ORAL_CAPSULE | Freq: Every day | ORAL | 3 refills | Status: DC

## 2016-10-03 MED ORDER — ZOLPIDEM TARTRATE 10 MG PO TABS: 10.0000 mg | ORAL_TABLET | Freq: Every evening | ORAL | 0 refills | Status: DC | PRN

## 2016-10-03 NOTE — Progress Notes (Signed)
I agree with the assessment and plan as directed by NP .The patient is known to me .   Jontavia Leatherbury, MD  

## 2016-10-03 NOTE — Progress Notes (Signed)
PATIENT: Connie Owens DOB: 1946-01-10  REASON FOR VISIT: follow up- OSA on CPAP, migraines HISTORY FROM: patient  HISTORY OF PRESENT ILLNESS: Today 10/03/16: Connie Owens is a 71 year old female with a history of obstructive sleep apnea migraines. She returns today for follow-up. Her CPAP download indicates that she uses her machine every night for compliance of 100%. She uses her machine greater than 4 hours 22 out of 30 days for compliance of 73%. On average she uses her machine for 5 hours and 34 minutes. She has a residual AHI of 0.3 on 6 cm water with EPR of 3. She does not have a significant leak. She reports that the CPAP continues to work well for her. She denies any issues with that. She reports that her headaches have been under good control with nortriptyline. She states that she has tried to reduce nortriptyline to 40 mg however she has noticed a disruption in her sleep and therefore some nights she has to take 50 mg. She states that she may have sharp shooting pain on the left side of the head 2-3 times a week. This pain usually last 45 minutes and then resolves spontaneously. She has found that stress is a trigger. She denies photophobia, phonophobia, nausea and vomiting. She does report that in September she will undergo surgery for a Tummy Tuck.  HISTORY Copied from Dr. Edwena Felty notes: Interval history from 05/04/2016, The pleasure of seeing Connie Owens today for revisit or has been more sparingly using Excedrin Migraine and still has headaches. She does not report excessive daytime sleepiness given that she endorsed the Epworth score at 7 points, fatigue severity at 25 points, and geriatric depression score at only 1 out of 15 points. She is using CPAP for the treatment of obstructive sleep apnea at a very low pressure of 6 cm water with a residual AHI of 0.5 and a compliance of 75% 100% 4 days 75% 4 hours of daily use was 5 hours 58 minutes on average. The needs to be no  adjustments made to her CPAP except that I would appreciate if she can try to use it at least 4 hours each night. CPAP has helped her headaches. She has signed up with a local gym and is trying to lose weight by diet and exercise.   REVIEW OF SYSTEMS: Out of a complete 14 system review of symptoms, the patient complains only of the following symptoms, and all other reviewed systems are negative.  Diarrhea, insomnia, joint pain, back pain, aching muscles, speech difficulty, headache  ALLERGIES: Allergies  Allergen Reactions  . Iohexol Rash    rash  . Iodine   . Statins   . Imdur [Isosorbide Nitrate]     Severe headaches  . Latex Rash  . Tape Rash and Other (See Comments)    Plastic tape  . Verapamil     REACTION: dizziness    HOME MEDICATIONS: Outpatient Medications Prior to Visit  Medication Sig Dispense Refill  . aspirin 81 MG tablet Take 81 mg by mouth daily.    . brimonidine-timolol (COMBIGAN) 0.2-0.5 % ophthalmic solution Apply to eye. 2 gtts L eye, 1 gtt R eye.    . carvedilol (COREG) 6.25 MG tablet Take 1 tablet (6.25 mg total) by mouth 2 (two) times daily. 180 tablet 3  . clobetasol ointment (TEMOVATE) 1.74 % Apply 1 application topically 2 (two) times daily. 90 g 3  . clopidogrel (PLAVIX) 75 MG tablet Take 1 tablet (75 mg total) by  mouth daily with breakfast. 90 tablet 3  . diphenoxylate-atropine (LOMOTIL) 2.5-0.025 MG per tablet 1 to 2 tablets in the morning, then every 6 hours as needed for diarrhea. 720 tablet 3  . estradiol (ESTRACE) 1 MG tablet Take 5 mg by mouth daily.     [name] gabapentin (NEURONTIN) 100 MG capsule 2 po tid for pain (Patient taking differently: Taking 400mg  at bedtime.) 540 capsule 3  . latanoprost (XALATAN) 0.005 % ophthalmic solution Apply 1 drop to eye at bedtime.    [name] losartan (COZAAR) 50 MG tablet Take 1 tablet (50 mg total) by mouth daily. Needs appt. 90 tablet 3  . nortriptyline (PAMELOR) 10 MG capsule Take 5 capsules (50 mg total) by mouth at  bedtime. 450 capsule 1  . prednisoLONE acetate (PRED FORTE) 1 % ophthalmic suspension Place 1 drop into both eyes 2 (two) times daily.    [name] zolpidem (AMBIEN) 10 MG tablet Take 1 tablet (10 mg total) by mouth at bedtime as needed for sleep. 90 tablet 1  . nitroGLYCERIN (NITROSTAT) 0.4 MG SL tablet Place 1 tablet (0.4 mg total) under the tongue every 5 (five) minutes x 3 doses as needed for chest pain. 25 tablet 5   No facility-administered medications prior to visit.     PAST MEDICAL HISTORY: Past Medical History:  Diagnosis Date  . Anxiety state    NOS  . Arthritis    osteo...right knee  . Back pain   . CAD (coronary artery disease)    NSTEMI 8/13 => LHC 09/27/11: oLM 50%, dLM 30%, oLAD 60-70%, pLAD 50%, mLAD 70%, pD1 30%, pD2 40%, oRCA 25%, mRCA 40%, EF 50% with ant HK => LAD not amenable to PCI => med Rx  unless recurrent angina = > consider CABG  . Carpal tunnel syndrome   . Corneal disorder   . Diverticular disease   . Dysmetabolic syndrome X   . GERD (gastroesophageal reflux disease)   . 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   [name] Headache, paroxysmal hemicrania, chronic 07/23/2012    Referral to BOTOX evaluation with dr Rexene Alberts or Krista Blue .  [name] Heart attack ([place]) 09/2011  . Hernia    hiatal  . HLD (hyperlipidemia)   . Hx of adenomatous colonic polyps   . Hypertension    essen. NOS  . Ischemic cardiomyopathy    a.  Echo 09/29/11: EF 30-35% with inferior, posterior, lateral AK, anterior severe HK, mild LVH, mild MR, PASP 40;  => b. follow up echo 10/13:  EF 55-60%, Gr 1 diast dysfn, mild MR  . Migraine headache without aura 07/23/2012    Left sided  Lamonte Sakai thought to be migrainous  , hypersensitivity to touch , photophobia ,  no nausea.   [name] Postmenopausal status   . Shoulder fracture   . Sleep apnea    CPAP    PAST SURGICAL HISTORY: Past Surgical History:  Procedure Laterality Date  . ABDOMINAL HYSTERECTOMY    .  ankle cystectomy Right   . APPENDECTOMY    . BREAST EXCISIONAL BIOPSY Right 02/15/1994  . BREAST LUMPECTOMY Right 1996  . BUNIONECTOMY Right   . CARDIAC CATHETERIZATION  09/28/2011   Dr Acie Fredrickson(  to see Dr Ron Parker)  . CHOLECYSTECTOMY    . COLONOSCOPY W/ POLYPECTOMY    . 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 CORNEAL TRANSPLANT Left    x1  .  HEMORRHOID SURGERY  12/01/2010, 2014   [place] hemorrhoid ligation/pexy  . LEFT HEART CATHETERIZATION WITH CORONARY ANGIOGRAM N/A 09/28/2011   Procedure: LEFT HEART CATHETERIZATION WITH CORONARY ANGIOGRAM;  Surgeon: Minus Breeding, MD;  Location[facility] CATH LAB;  Service: Cardiovascular;  Laterality: N/A;  . lumbar spur removed    . rectocele surgery    . TOTAL KNEE ARTHROPLASTY Right     FAMILY HISTORY: Family History  Problem Relation Age of Onset  . Breast cancer Mother   . Heart disease Father   . Stroke Father   . Dementia Father   . Prostate cancer Father   . Diabetes Sister        3 sisters   . Heart disease Sister        1 sister CABG  . Breast cancer Sister        PTE pre & post Dx of ca  . Diabetes Paternal Grandfather   . Breast cancer Sister   . Colon cancer Neg Hx     SOCIAL HISTORY: Social History   Social History  . Marital status: Widowed    Spouse name: Ruthann Cancer  . Number of children: 3  . Years of education: 14   Occupational History  . retired Not Employed  .  Retired   Social History Main Topics  . Smoking status: Never Smoker  . Smokeless tobacco: Never Used  . Alcohol use Yes     Comment: rare  . Drug use: No  . Sexual activity: Not on file   Other Topics Concern  . Not on file   Social History Narrative   Patient is widowed,([place] past 10/02/13) and lives at home with her husband.   Patient has three adult children.   Patient has a college education.   Patient is right-handed.   Patient drinks two cups of coffee daily.   Regular exercise   Patient is  retired.      PHYSICAL EXAM  Vitals:   10/03/16 0918  BP: 126/80  Pulse: 70  Weight: 211 lb 9.6 oz (96 kg)  Height: 5\' 3"  (1.6 m)   Body mass index is 37.48 kg/m.  Generalized: Well developed, in no acute distress   Neurological examination  Mentation: Alert oriented to time, place, history taking. Follows all commands speech and language fluent Cranial nerve II-XII: Pupils were equal round reactive to light. Extraocular movements were full, visual field were full on confrontational test. Facial sensation and strength were normal. Uvula tongue midline. Head turning and shoulder shrug  were normal and symmetric. Motor: The motor testing reveals 5 over 5 strength of all 4 extremities. Good symmetric motor tone is noted throughout.  Sensory: Sensory testing is intact to soft touch on all 4 extremities. No evidence of extinction is noted.  Coordination: Cerebellar testing reveals good finger-nose-finger and heel-to-shin bilaterally.  Gait and station: Gait is normal. Tandem gait is normal. Romberg is negative. No drift is seen.  Reflexes: Deep tendon reflexes are symmetric and normal bilaterally.   DIAGNOSTIC DATA (LABS, IMAGING, TESTING) - I reviewed patient records, labs, notes, testing and imaging myself where available.  Lab Results  Component Value Date   WBC 9.5 01/13/2016   HGB 14.3 01/13/2016   HCT 43.0 01/13/2016   MCV 89.7 01/13/2016   PLT 318.0 01/13/2016      Component Value Date/Time   NA 138 01/13/2016 1158   K 4.4 01/13/2016 1158   CL 103 01/13/2016 1158   CO2 28 01/13/2016 1158  GLUCOSE 109 (H) 01/13/2016 1158   BUN 18 01/13/2016 1158   CREATININE 0.88 01/13/2016 1158   CREATININE 0.87 11/17/2014 0844   CALCIUM 9.8 01/13/2016 1158   PROT 7.4 01/13/2016 1158   ALBUMIN 4.1 01/13/2016 1158   AST 20 01/13/2016 1158   ALT 16 01/13/2016 1158   ALKPHOS 53 01/13/2016 1158   BILITOT 0.3 01/13/2016 1158   GFRNONAA 68 11/17/2014 0844   GFRAA 79 11/17/2014  0844   Lab Results  Component Value Date   CHOL 192 12/16/2015   HDL 59.30 12/16/2015   LDLCALC 100 (H) 12/16/2015   LDLDIRECT 140.8 03/18/2013   TRIG 160.0 (H) 12/16/2015   CHOLHDL 3 12/16/2015   Lab Results  Component Value Date   HGBA1C 5.9 12/29/2013   Lab Results  Component Value Date   VITAMINB12 291 03/18/2013   Lab Results  Component Value Date   TSH 2.00 12/16/2015      ASSESSMENT AND PLAN 71 y.o. year old female  has a past medical history of Anxiety state; Arthritis; Back pain; CAD (coronary artery disease); Carpal tunnel syndrome; Corneal disorder; Diverticular disease; Dysmetabolic syndrome X; GERD (gastroesophageal reflux disease); Glaucoma (increased eye pressure) (07/30/2013); Headache, paroxysmal hemicrania, chronic (07/23/2012); Heart attack ([place]) (09/2011); Hernia; HLD (hyperlipidemia); adenomatous colonic polyps; Hypertension; Ischemic cardiomyopathy; Migraine headache without aura (07/23/2012); Postmenopausal status; Shoulder fracture; and Sleep apnea. here with:  1. Obstructive sleep apnea on CPAP 2. Headache  The patient CPAP download shows excellent compliance and good treatment of her apnea. She is encouraged to continue using her CPAP nightly. Patient reports that her headaches are under good control with nortriptyline. We will continue nortriptyline 50 mg at bedtime. She should continue to try to reduce her dose to 40 mg at bedtime. Patient voices understanding. She will follow-up in 6 months or sooner if needed.     Ward Givens, MSN, NP-C 10/03/2016, 9:33 AM [facility] [address] [place], [place] [address] [phone]

## 2016-10-03 NOTE — Patient Instructions (Signed)
Your Plan:  Continue nortriptyline 50 mg at bedtime Continue to using cpap nightly If your symptoms worsen or you develop new symptoms please let us know.    Thank you for coming to see [place] at [facility]. I hope we have been able to provide you high quality care today.  You may receive a patient satisfaction survey over the next few weeks. We would appreciate your feedback and comments so that we may continue to improve ourselves and the health of our patients.

## 2016-10-04 DIAGNOSIS — H401131 Primary open-angle glaucoma, bilateral, mild stage: Secondary | ICD-10-CM | POA: Diagnosis not present

## 2016-10-11 ENCOUNTER — Ambulatory Visit
Admission: RE | Admit: 2016-10-11 | Discharge: 2016-10-11 | Disposition: A | Discharge status: Home / Self Care | Payer: Self-pay | Source: Ambulatory Visit | Attending: Orthopedic Surgery | Admitting: Orthopedic Surgery

## 2016-10-11 ENCOUNTER — Other Ambulatory Visit: Payer: Self-pay | Admitting: Orthopedic Surgery

## 2016-10-11 DIAGNOSIS — R52 Pain, unspecified: Secondary | ICD-10-CM

## 2016-10-12 ENCOUNTER — Ambulatory Visit
Admission: RE | Admit: 2016-10-12 | Discharge: 2016-10-12 | Disposition: A | Discharge status: Home / Self Care | Payer: Medicare Other | Source: Ambulatory Visit | Attending: Orthopedic Surgery | Admitting: Orthopedic Surgery

## 2016-10-12 DIAGNOSIS — M5441 Lumbago with sciatica, right side: Secondary | ICD-10-CM

## 2016-10-12 DIAGNOSIS — M5127 Other intervertebral disc displacement, lumbosacral region: Secondary | ICD-10-CM | POA: Diagnosis not present

## 2016-10-12 MED ORDER — DIAZEPAM 5 MG PO TABS
5.0000 mg | ORAL_TABLET | Freq: Once | ORAL | Status: AC
  Administered 2016-10-12: 5 mg via ORAL

## 2016-10-12 MED ORDER — IOPAMIDOL (ISOVUE-M 200) INJECTION 41%
15.0000 mL | Freq: Once | INTRAMUSCULAR | Status: AC
  Administered 2016-10-12: 15 mL via INTRATHECAL

## 2016-10-12 MED ORDER — ONDANSETRON HCL 4 MG/2ML IJ SOLN
4.0000 mg | Freq: Once | INTRAMUSCULAR | Status: AC
  Administered 2016-10-12: 4 mg via INTRAMUSCULAR

## 2016-10-12 MED ORDER — MEPERIDINE HCL 100 MG/ML IJ SOLN
75.0000 mg | Freq: Once | INTRAMUSCULAR | Status: AC
  Administered 2016-10-12: 75 mg via INTRAMUSCULAR

## 2016-10-12 NOTE — Progress Notes (Signed)
Pt states she has been off Plavix for the past 5 days.

## 2016-10-12 NOTE — Discharge Instructions (Signed)
° °   Myelogram Discharge Instructions ° °1. Go home and rest quietly for the next 24 hours.  It is important to lie flat for the next 24 hours.  Get up only to go to the restroom.  You may lie in the bed or on a couch on your back, your stomach, your left side or your right side.  You may have one pillow under your head.  You may have pillows between your knees while you are on your side or under your knees while you are on your back. ° °2. DO NOT drive today.  Recline the seat as far back as it will go, while still wearing your seat belt, on the way home. ° °3. You may get up to go to the bathroom as needed.  You may sit up for 10 minutes to eat.  You may resume your normal diet and medications unless otherwise indicated.  Drink lots of extra fluids today and tomorrow. ° °4. The incidence of headache, nausea, or vomiting is about 5% (one in 20 patients).  If you develop a headache, lie flat and drink plenty of fluids until the headache goes away.  Caffeinated beverages may be helpful.  If you develop severe nausea and vomiting or a headache that does not go away with flat bed rest, call [phone]. ° °5. You may resume normal activities after your 24 hours of bed rest is over; however, do not exert yourself strongly or do any heavy lifting tomorrow. If when you get up you have a headache when standing, go back to bed and force fluids for another 24 hours. ° °6. Call your physician for a follow-up appointment.  The results of your myelogram will be sent directly to your physician by the following day. ° °7. If you have any questions or if complications develop after you arrive home, please call [phone]. ° °Discharge instructions have been explained to the patient.  The patient, or the person responsible for the patient, fully understands these instructions.  ° °  ° ° °MAY RESUME PLAVIX TODAY. °

## 2016-10-23 HISTORY — PX: ABDOMINOPLASTY: SUR9

## 2016-11-06 ENCOUNTER — Ambulatory Visit: Payer: Medicare Other | Admitting: Adult Health

## 2016-12-04 ENCOUNTER — Telehealth: Payer: Self-pay | Admitting: Family Medicine

## 2016-12-04 NOTE — Telephone Encounter (Signed)
°  Relation to TD:VVOH Call back Grandfather   Reason for call:  Patient requesting Pneumonia orders and would like orders placed at the [facility] site, please advise if this is possible, as well as the flu injection

## 2016-12-06 NOTE — Telephone Encounter (Signed)
Ok to do at [facility] if they are ok with it

## 2016-12-06 NOTE — Telephone Encounter (Signed)
Pneum 23

## 2016-12-06 NOTE — Telephone Encounter (Signed)
I did speak to the front office manager ([place]) and she did say ok to both PNA 23 and flu shot at the [place] office as long as there is documentation in her chart per PCP--which there is. Called the patient and gave her [facility] number to schedule a nurse visit appt for flu and pna 23 .  She verbalized understanding.

## 2016-12-06 NOTE — Telephone Encounter (Signed)
Pt states she is to get pneumonia shot every five years since she had a heart attack. Pt states she knows it is time for it again. Pt wants appt for flu shot and pneumonia shot in [place] location. Please verify what kind of pneumonia vax is needed and advise if she can be scheduled at [facility] for that. CB: [phone] land line. Pt cell [phone].

## 2016-12-07 ENCOUNTER — Ambulatory Visit (INDEPENDENT_AMBULATORY_CARE_PROVIDER_SITE_OTHER): Payer: Medicare Other | Admitting: General Practice

## 2016-12-07 DIAGNOSIS — Z23 Encounter for immunization: Secondary | ICD-10-CM | POA: Diagnosis not present

## 2016-12-07 DIAGNOSIS — Z9189 Other specified personal risk factors, not elsewhere classified: Secondary | ICD-10-CM

## 2016-12-15 DIAGNOSIS — H1851 Endothelial corneal dystrophy: Secondary | ICD-10-CM | POA: Diagnosis not present

## 2016-12-15 DIAGNOSIS — H52213 Irregular astigmatism, bilateral: Secondary | ICD-10-CM | POA: Diagnosis not present

## 2016-12-15 DIAGNOSIS — T86841 Corneal transplant failure: Secondary | ICD-10-CM | POA: Diagnosis not present

## 2016-12-15 DIAGNOSIS — Z947 Corneal transplant status: Secondary | ICD-10-CM | POA: Diagnosis not present

## 2017-01-18 DIAGNOSIS — H1851 Endothelial corneal dystrophy: Secondary | ICD-10-CM | POA: Diagnosis not present

## 2017-01-18 DIAGNOSIS — Z947 Corneal transplant status: Secondary | ICD-10-CM | POA: Diagnosis not present

## 2017-01-18 DIAGNOSIS — T86841 Corneal transplant failure: Secondary | ICD-10-CM | POA: Diagnosis not present

## 2017-01-18 DIAGNOSIS — H4062X2 Glaucoma secondary to drugs, left eye, moderate stage: Secondary | ICD-10-CM | POA: Diagnosis not present

## 2017-01-23 ENCOUNTER — Telehealth: Payer: Self-pay | Admitting: Family Medicine

## 2017-01-23 DIAGNOSIS — M792 Neuralgia and neuritis, unspecified: Secondary | ICD-10-CM

## 2017-01-23 NOTE — Telephone Encounter (Signed)
Copied from [place]. Topic: General - Other >> Jan 23, 2017  2:34 PM Darl Householder, RMA wrote: Reason for CRM: Medication refill request for Gabapentin 100 mg [facility]

## 2017-01-24 NOTE — Telephone Encounter (Signed)
Gabapentin refill. Last OV 01/13/16. Prescription written on 10-31/17 for 540 caps with 3 refills.

## 2017-01-25 ENCOUNTER — Telehealth: Payer: Self-pay | Admitting: Cardiology

## 2017-01-25 MED ORDER — GABAPENTIN 100 MG PO CAPS: ORAL_CAPSULE | ORAL | 0 refills | Status: DC

## 2017-01-25 NOTE — Telephone Encounter (Signed)
Patient notified and rx sent in 

## 2017-01-25 NOTE — Telephone Encounter (Signed)
°*  STAT* If patient is at the pharmacy, call can be transferred to refill team.   1. Which medications need to be refilled? (please list name of each medication and dose if known) clopidogrel, lostartin, cardivel nitrospat  2. Which pharmacy/location (including street and city if local pharmacy) is medication to be sent to[place] [facility]  3. Do they need a 30 day or 90 day supply? [place]

## 2017-01-26 MED ORDER — LOSARTAN POTASSIUM 50 MG PO TABS: 50.0000 mg | ORAL_TABLET | Freq: Every day | ORAL | 1 refills | Status: DC

## 2017-01-26 MED ORDER — CLOPIDOGREL BISULFATE 75 MG PO TABS: 75.0000 mg | ORAL_TABLET | Freq: Every day | ORAL | 1 refills | Status: DC

## 2017-01-26 MED ORDER — NITROGLYCERIN 0.4 MG SL SUBL: 0.4000 mg | SUBLINGUAL_TABLET | SUBLINGUAL | 1 refills | Status: DC | PRN

## 2017-01-26 MED ORDER — CARVEDILOL 6.25 MG PO TABS: 6.2500 mg | ORAL_TABLET | Freq: Two times a day (BID) | ORAL | 1 refills | Status: DC

## 2017-02-01 DIAGNOSIS — T86841 Corneal transplant failure: Secondary | ICD-10-CM | POA: Diagnosis not present

## 2017-02-01 DIAGNOSIS — Z947 Corneal transplant status: Secondary | ICD-10-CM | POA: Diagnosis not present

## 2017-02-09 ENCOUNTER — Other Ambulatory Visit: Payer: Self-pay | Admitting: Neurology

## 2017-02-09 ENCOUNTER — Telehealth: Payer: Self-pay | Admitting: Neurology

## 2017-02-09 MED ORDER — ZOLPIDEM TARTRATE 10 MG PO TABS: 10.0000 mg | ORAL_TABLET | Freq: Every evening | ORAL | 0 refills | Status: DC | PRN

## 2017-02-09 NOTE — Telephone Encounter (Signed)
Pt is calling for a refill of zolpidem (AMBIEN) 10 MG tablet pt still using  [facility] [place], [place] [phone] (Phone) [phone] (Fax)

## 2017-02-09 NOTE — Telephone Encounter (Signed)
Ordered and will send this to the pharmacy listed

## 2017-02-12 ENCOUNTER — Telehealth: Payer: Self-pay | Admitting: Cardiology

## 2017-02-12 NOTE — Telephone Encounter (Signed)
New message    Patient calling about concerns of discoloration in both feet. Tingling and a "fire" feeling in feet when laying down.

## 2017-02-12 NOTE — Telephone Encounter (Signed)
Returned call to patient.She stated for the past 2 to 3 months she has noticed when she sits down both feet are purple and cold.Stated they tingle and burn.She has appointment with PA 03/06/17, but would like to be seen sooner.Appointment scheduled with Jory Sims DNP 02/15/17 at 8:30 am.

## 2017-02-12 NOTE — Progress Notes (Signed)
Cardiology Office Note   Date:  02/15/2017   ID:  Connie Owens, DOB 1945/10/19, MRN [id number]  PCP:  Carollee Herter, Alferd Apa, DO  Cardiologist:  Minus Breeding, MD   Chief Complaint  Patient presents with  . Follow-up    2-3 months. Patient having burning sensation in both feet as well as discoloration.  . Leg Swelling     History of Present Illness: Connie Owens is a 71 y.o. female who presents for ongoing assessment and management of coronary artery disease managed medically, history of cardiomyopathy with HFrEF recent echocardiogram revealing EF of 30% to 35%.    She was last seen in the office by Dr. Percival Spanish on 04/23/2016 for preoperative evaluation for plastic surgery.  Permission hold Plavix was granted.  He was asymptomatic at the time, and was cleared for surgery.  She comes today without cardiac complaints. She has some questions about discoloration in the feet and ankles in the dependent position. She has significant varicosities and has noticed some mild edema also. She denies pain, unilateral edema, or intermittent claudication symptoms. She is medically compliant.  Past Medical History:  Diagnosis Date  . Anxiety state    NOS  . Arthritis    osteo...right knee  . Back pain   . CAD (coronary artery disease)    NSTEMI 8/13 => LHC 09/27/11: oLM 50%, dLM 30%, oLAD 60-70%, pLAD 50%, mLAD 70%, pD1 30%, pD2 40%, oRCA 25%, mRCA 40%, EF 50% with ant HK => LAD not amenable to PCI => med Rx  unless recurrent angina = > consider CABG  . Carpal tunnel syndrome   . Corneal disorder   . Diverticular disease   . Dysmetabolic syndrome X   . GERD (gastroesophageal reflux disease)   . 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   [name] Headache, paroxysmal hemicrania, chronic 07/23/2012    Referral to BOTOX evaluation with dr Rexene Alberts or Krista Blue .  [name] Heart attack ([place]) 09/2011  . Hernia    hiatal  . HLD (hyperlipidemia)     . Hx of adenomatous colonic polyps   . Hypertension    essen. NOS  . Ischemic cardiomyopathy    a.  Echo 09/29/11: EF 30-35% with inferior, posterior, lateral AK, anterior severe HK, mild LVH, mild MR, PASP 40;  => b. follow up echo 10/13:  EF 55-60%, Gr 1 diast dysfn, mild MR  . Migraine headache without aura 07/23/2012    Left sided  Lamonte Sakai thought to be migrainous  , hypersensitivity to touch , photophobia ,  no nausea.   [name] Postmenopausal status   . Shoulder fracture   . Sleep apnea    CPAP    Past Surgical History:  Procedure Laterality Date  . ABDOMINAL HYSTERECTOMY    . ankle cystectomy Right   . APPENDECTOMY    . BREAST EXCISIONAL BIOPSY Right 02/15/1994  . BREAST LUMPECTOMY Right 1996  . BUNIONECTOMY Right   . CARDIAC CATHETERIZATION  09/28/2011   Dr Acie Fredrickson(  to see Dr Ron Parker)  . CHOLECYSTECTOMY    . COLONOSCOPY W/ POLYPECTOMY    . 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 CORNEAL TRANSPLANT Left    x1  . HEMORRHOID SURGERY  12/01/2010, 2014   [place] hemorrhoid ligation/pexy  . LEFT HEART CATHETERIZATION WITH CORONARY ANGIOGRAM N/A 09/28/2011   Procedure: LEFT HEART CATHETERIZATION WITH CORONARY ANGIOGRAM;  Surgeon:  Minus Breeding, MD;  Location: [facility] CATH LAB;  Service: Cardiovascular;  Laterality: N/A;  . lumbar spur removed    . rectocele surgery    . TOTAL KNEE ARTHROPLASTY Right      Current Outpatient Medications  Medication Sig Dispense Refill  . aspirin 81 MG tablet Take 81 mg by mouth daily.    . brimonidine-timolol (COMBIGAN) 0.2-0.5 % ophthalmic solution Apply to eye. 2 gtts L eye, 1 gtt R eye.    . carvedilol (COREG) 6.25 MG tablet Take 1 tablet (6.25 mg total) by mouth 2 (two) times daily. 180 tablet 1  . clobetasol ointment (TEMOVATE) 1.61 % Apply 1 application topically 2 (two) times daily. 90 g 3  . clopidogrel (PLAVIX) 75 MG tablet Take 1 tablet (75 mg total) by mouth daily. 90 tablet 1  . diphenoxylate-atropine  (LOMOTIL) 2.5-0.025 MG per tablet 1 to 2 tablets in the morning, then every 6 hours as needed for diarrhea. 720 tablet 3  . estradiol (ESTRACE) 1 MG tablet Take 5 mg by mouth daily.     [name] gabapentin (NEURONTIN) 100 MG capsule 2 po tid for pain (Patient taking differently: Take 100 mg by mouth 3 (three) times daily. 2 po tid for pain) 540 capsule 0  . losartan (COZAAR) 50 MG tablet Take 1 tablet (50 mg total) by mouth daily. Needs appt. 90 tablet 1  . nitroGLYCERIN (NITROSTAT) 0.4 MG SL tablet Place 1 tablet (0.4 mg total) under the tongue every 5 (five) minutes x 3 doses as needed for chest pain. 75 tablet 1  . nortriptyline (PAMELOR) 10 MG capsule Take 5 capsules (50 mg total) by mouth at bedtime. 450 capsule 3  . prednisoLONE acetate (PRED FORTE) 1 % ophthalmic suspension Place 1 drop into both eyes 2 (two) times daily.    [name] zolpidem (AMBIEN) 10 MG tablet Take 1 tablet (10 mg total) by mouth at bedtime as needed for sleep. (Patient taking differently: Take 10 mg by mouth at bedtime. ) 90 tablet 0   No current facility-administered medications for this visit.     Allergies:   Iohexol; Statins; Imdur [isosorbide nitrate]; Latex; Tape; and Verapamil    Social History:  The patient  reports that  has never smoked. she has never used smokeless tobacco. She reports that she drinks alcohol. She reports that she does not use drugs.   Family History:  The patient's family history includes Breast cancer in her mother, sister, and sister; Dementia in her father; Diabetes in her paternal grandfather and sister; Heart disease in her father and sister; Prostate cancer in her father; Stroke in her father.    ROS: All other systems are reviewed and negative. Unless otherwise mentioned in H&P    PHYSICAL EXAM: VS:  BP 130/90   Pulse 80   Ht 5\' 3"  (1.6 m)   Wt 198 lb (89.8 kg)   BMI 35.07 kg/m  , BMI Body mass index is 35.07 kg/m. GEN: Well nourished, well developed, in no acute distress  HEENT:  normal  Neck: no JVD, carotid bruits, or masses Cardiac: RRR; 1/6 systolic murmur, heard best at the apex,  rubs, or gallops, mild non-pitting dependent edema. Significant varicosities are noted.  Respiratory:  Clear to auscultation bilaterally, normal work of breathing GI: soft, nontender, nondistended, + BS MS: no deformity or atrophy  Skin: warm and dry, no rash Neuro:  Strength and sensation are intact Psych: euthymic mood, full affect    Recent Labs: No results found for  requested labs within last 8760 hours.    Lipid Panel    Component Value Date/Time   CHOL 192 12/16/2015 0950   TRIG 160.0 (H) 12/16/2015 0950   HDL 59.30 12/16/2015 0950   CHOLHDL 3 12/16/2015 0950   VLDL 32.0 12/16/2015 0950   LDLCALC 100 (H) 12/16/2015 0950   LDLDIRECT 140.8 03/18/2013 1416      Wt Readings from Last 3 Encounters:  02/15/17 198 lb (89.8 kg)  10/03/16 211 lb 9.6 oz (96 kg)  05/04/16 214 lb (97.1 kg)      Other studies Reviewed: Echocardiogram 12/03/2011.  - Left ventricle: The cavity size was normal. Wall thickness was normal. Systolic function was normal. The estimated ejection fraction was in the range of 55% to 60%. Wall motion was normal; there were no regional wall motion abnormalities. Doppler parameters are consistent with abnormal left ventricular relaxation (grade 1 diastolic dysfunction). - Mitral valve: Mild regurgitation.   ASSESSMENT AND PLAN:  1. Chronic systolic dysfunction: Continue carvedilol for HR control. She is not currently on diuretic. She can be consider for prn use for fluid retention. She does not appear to be fluid overloaded. She has not had echo since 2013. Will repeat for ongoing assessment.   2. CAD: Managed medically. Continue carvedilol, ARB, ASA. Will need lipids and LFT's. She is due to see her PCP for labs and follow up in one month.   3. Varicosities with dependent edema; I have advised she have support hose placed to  prevent dependent edema and venous statis skin changes.     Current medicines are reviewed at length with the patient today.    Labs/ tests ordered today include:  Phill Myron. [place] Pugh, ANP, AACC   02/15/2017 8:56 AM     [facility] [address] [place], [place] [address] Phone: [phone]; Fax: [phone]

## 2017-02-15 ENCOUNTER — Encounter: Payer: Self-pay | Admitting: Adult Health

## 2017-02-15 ENCOUNTER — Ambulatory Visit (INDEPENDENT_AMBULATORY_CARE_PROVIDER_SITE_OTHER): Payer: Medicare Other | Admitting: Adult Health

## 2017-02-15 VITALS — BP 130/90 | HR 80 | Ht 63.0 in | Wt 198.0 lb

## 2017-02-15 DIAGNOSIS — I83893 Varicose veins of bilateral lower extremities with other complications: Secondary | ICD-10-CM | POA: Diagnosis not present

## 2017-02-15 DIAGNOSIS — I519 Heart disease, unspecified: Secondary | ICD-10-CM | POA: Diagnosis not present

## 2017-02-15 DIAGNOSIS — I5189 Other ill-defined heart diseases: Secondary | ICD-10-CM

## 2017-02-15 MED ORDER — NITROGLYCERIN 0.4 MG SL SUBL: 0.4000 mg | SUBLINGUAL_TABLET | SUBLINGUAL | 1 refills | Status: DC | PRN

## 2017-02-15 NOTE — Patient Instructions (Addendum)
Medication Instructions:  NO CHANGES-Your physician recommends that you continue on your current medications as directed. Please refer to the Current Medication list given to you today.  If you need a refill on your cardiac medications before your next appointment, please call your pharmacy.  Testing/Procedures: Echocardiogram - Your physician has requested that you have an echocardiogram BEFORE YOUR SCHEDULED APPT IN [place]. Echocardiography is a painless test that uses sound waves to create images of your heart. It provides your doctor with information about the size and shape of your heart and how well your heart's chambers and valves are working. This procedure takes approximately one hour. There are no restrictions for this procedure. This will be performed at our [facility] location - [address], [address].  Special Instructions: GET COMPRESSION STOCKINGS AND WEAR DAILY AND OFF AT NIGHT  Follow-Up: Your physician wants you to follow-up in: AS SCHEDULED WITH DR [place].  Thank you for choosing [facility] at [facility]!!

## 2017-02-16 DIAGNOSIS — Z124 Encounter for screening for malignant neoplasm of cervix: Secondary | ICD-10-CM | POA: Diagnosis not present

## 2017-02-16 DIAGNOSIS — Z01419 Encounter for gynecological examination (general) (routine) without abnormal findings: Secondary | ICD-10-CM | POA: Diagnosis not present

## 2017-02-20 ENCOUNTER — Ambulatory Visit: Payer: Medicare Other | Attending: Obstetrics and Gynecology | Admitting: Physical Therapy

## 2017-02-20 ENCOUNTER — Encounter: Payer: Self-pay | Admitting: Physical Therapy

## 2017-02-20 DIAGNOSIS — M62838 Other muscle spasm: Secondary | ICD-10-CM | POA: Insufficient documentation

## 2017-02-20 DIAGNOSIS — M6281 Muscle weakness (generalized): Secondary | ICD-10-CM | POA: Diagnosis not present

## 2017-02-20 DIAGNOSIS — R279 Unspecified lack of coordination: Secondary | ICD-10-CM | POA: Diagnosis not present

## 2017-02-20 NOTE — Patient Instructions (Addendum)
Scar Massage  Scar massage is done to improve the mobility of scar, decrease scar tissue from building up, reduce adhesions, and prevent Keloids from forming. Start scar massage after scabs have fallen off by themselves and no open areas. The first few weeks after surgery, it is normal for a scar to appear pink or red and slightly raised. Scars can itch or have areas of numbness. Some scars may be sensitive.   Direct Scar massage: after scar is healed, no opening, no scab 1.  Place pads of two fingers together directly on the scar starting at one end of the scar. Move the fingers up and down across the scar holding 5 seconds one direction.  Then go opposite direction hold 5 seconds.  2. Move over to the next section of the scar and repeat.  Work your way along the entire length of the scar.   3. Next make diagonal movements along the scar holding 5 seconds at one direction. 4. Next movement is side to side. 5. Do not rub fingers over the scar.  Instead keep firm pressure and move scar over the tissue it is on top   Scar Lift and Roll 12 weeks after surgery. 1. Pinch a small amount of the scar between your first two fingers and thumb.  2. Roll the scar between your fingers for 5 to 15 seconds. 3. Move along the scar and repeat until you have massaged the entire length of scar.   Stop the massage and call your doctor if you notice: 1. Increased redness 2. Bleeding from scar 3. Seepage coming from the scar 4. Scar is warmer and has increased pain     About Abdominal Massage  Abdominal massage, also called external colon massage, is a self-treatment circular massage technique that can reduce and eliminate gas and ease constipation. The colon naturally contracts in waves in a clockwise direction starting from inside the right hip, moving up toward the ribs, across the belly, and down inside the left hip.  When you perform circular abdominal massage, you help stimulate your colon's normal wave  pattern of movement called peristalsis.  It is most beneficial when done after eating.  Positioning You can practice abdominal massage with oil while lying down, or in the shower with soap.  Some people find that it is just as effective to do the massage through clothing while sitting or standing.  How to Massage Start by placing your finger tips or knuckles on your right side, just inside your hip bone.  . Make small circular movements while you move upward toward your rib cage.   . Once you reach the bottom right side of your rib cage, take your circular movements across to the left side of the bottom of your rib cage.  . Next, move downward until you reach the inside of your left hip bone.  This is the path your feces travel in your colon. . Continue to perform your abdominal massage in this pattern for 10 minutes each day.     You can apply as much pressure as is comfortable in your massage.  Start gently and build pressure as you continue to practice.  Notice any areas of pain as you massage; areas of slight pain may be relieved as you massage, but if you have areas of significant or intense pain, consult with your healthcare provider.  Other Considerations . General physical activity including bending and stretching can have a beneficial massage-like effect on the colon.  Deep  breathing can also stimulate the colon because breathing deeply activates the same nervous system that supplies the colon.   . Abdominal massage should always be used in combination with a bowel-conscious diet that is high in the proper type of fiber for you, fluids (primarily water), and a regular exercise program. Quick Contraction: Gravity Resisted (Sitting)    Sitting, quickly squeeze then fully relax pelvic floor. Perform _1_ sets of __5_. Rest for _1_ seconds between sets. Do _3__ times a day.  Copyright  VHI. All rights reserved.  Slow Contraction: Gravity Resisted (Sitting)    Sitting, slowly  squeeze pelvic floor for _5__ seconds. Rest for __5_ seconds. Repeat _10__ times. Do __3_ times a day.  Copyright  VHI. All rights reserved.  [facility] [address] [place], [place] [address] Phone # [phone] Fax [phone]

## 2017-02-20 NOTE — Therapy (Signed)
[facility] [address] [place], [place], [address] Phone: [phone]   Fax:  [phone]  Physical Therapy Evaluation  Patient Details  Name: Connie Owens MRN: [id number] Date of Birth: 08/11/1945 Referring Provider: Dr. Bobbye Charleston   Encounter Date: 02/20/2017  PT End of Session - 02/20/17 1145    Visit Number  1    Date for PT Re-Evaluation  04/17/17    Authorization Type  medicare    PT Start Time  1100    PT Stop Time  1145    PT Time Calculation (min)  45 min    Activity Tolerance  Patient tolerated treatment well    Behavior During Therapy  [facility] for tasks assessed/performed       Past Medical History:  Diagnosis Date  . Anxiety state    NOS  . Arthritis    osteo...right knee  . Back pain   . CAD (coronary artery disease)    NSTEMI 8/13 => LHC 09/27/11: oLM 50%, dLM 30%, oLAD 60-70%, pLAD 50%, mLAD 70%, pD1 30%, pD2 40%, oRCA 25%, mRCA 40%, EF 50% with ant HK => LAD not amenable to PCI => med Rx  unless recurrent angina = > consider CABG  . Carpal tunnel syndrome   . Corneal disorder   . Diverticular disease   . Dysmetabolic syndrome X   . GERD (gastroesophageal reflux disease)   . 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   [name] Headache, paroxysmal hemicrania, chronic 07/23/2012    Referral to BOTOX evaluation with dr Rexene Alberts or Krista Blue .  [name] Heart attack ([place]) 09/2011  . Hernia    hiatal  . HLD (hyperlipidemia)   . Hx of adenomatous colonic polyps   . Hypertension    essen. NOS  . Ischemic cardiomyopathy    a.  Echo 09/29/11: EF 30-35% with inferior, posterior, lateral AK, anterior severe HK, mild LVH, mild MR, PASP 40;  => b. follow up echo 10/13:  EF 55-60%, Gr 1 diast dysfn, mild MR  . Migraine headache without aura 07/23/2012    Left sided  Lamonte Sakai thought to be migrainous  , hypersensitivity to touch , photophobia ,  no nausea.   [name]  Postmenopausal status   . Shoulder fracture   . Sleep apnea    CPAP    Past Surgical History:  Procedure Laterality Date  . ABDOMINAL HYSTERECTOMY    . ankle cystectomy Right   . APPENDECTOMY    . BREAST EXCISIONAL BIOPSY Right 02/15/1994  . BREAST LUMPECTOMY Right 1996  . BUNIONECTOMY Right   . CARDIAC CATHETERIZATION  09/28/2011   Dr Acie Fredrickson(  to see Dr Ron Parker)  . CHOLECYSTECTOMY    . COLONOSCOPY W/ POLYPECTOMY    . 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 CORNEAL TRANSPLANT Left    x1  . HEMORRHOID SURGERY  12/01/2010, 2014   [place] hemorrhoid ligation/pexy  . LEFT HEART CATHETERIZATION WITH CORONARY ANGIOGRAM N/A 09/28/2011   Procedure: LEFT HEART CATHETERIZATION WITH CORONARY ANGIOGRAM;  Surgeon: Minus Breeding, MD;  Location: [facility] CATH LAB;  Service: Cardiovascular;  Laterality: N/A;  . lumbar spur removed    . rectocele surgery    . TOTAL KNEE ARTHROPLASTY Right     There were no vitals filed for this visit.   Subjective Assessment - 02/20/17 1104    Subjective  Patient reports  urinary leakage for awhile but the last few months having trouble getting to the bathroom and has to wear a depends.  Patient is passing gas more and diahrrea.  No fecal soilage.     Patient Stated Goals  be able to get to the bathroom without dribbling, not have to wear a pad out of the house    Currently in Pain?  No/denies    Multiple Pain Sites  No         [facility] PT Assessment - 02/20/17 0001      Assessment   Medical Diagnosis  N39.3 Stress Incontinence    Referring Provider  Dr. Bobbye Charleston    Onset Date/Surgical Date  10/21/16    Prior Therapy  None      Precautions   Precautions  None      Restrictions   Weight Bearing Restrictions  No      Balance Screen   Has the patient fallen in the past 6 months  No    Has the patient had a decrease in activity level because of a fear of falling?   No    Is the patient reluctant to leave their  home because of a fear of falling?   No      Home Film/video editor residence      Prior Function   Level of Independence  Independent    Leisure  pool aerobic, treadmill, arm weightd      Cognition   Overall Cognitive Status  Within Functional Limits for tasks assessed      Observation/Other Assessments   Skin Integrity  abdominalplasty scar is healed and tight     Focus on Therapeutic Outcomes (FOTO)   limited by 40% with urinary leakage and not wanting to go out of the house as much      Posture/Postural Control   Posture/Postural Control  Postural limitations    Postural Limitations  Rounded Shoulders;Forward head increased cervical thoracic kyphosis      ROM / Strength   AROM / PROM / Strength  AROM;PROM;Strength      Strength   Overall Strength Comments  abdominal strength 2/5    Right Hip Extension  3+/5    Right Hip ABduction  4+/5    Left Hip Extension  3+/5    Left Hip ABduction  3+/5      Palpation   Palpation comment  tenderness in upper abdominal mid section; tightness in lower abdominal      Transfers   Transfers  Not assessed      Ambulation/Gait   Ambulation/Gait  No             Objective measurements completed on examination: See above findings.    Pelvic Floor Special Questions - 02/20/17 0001    Prior Pregnancies  Yes    Number of Pregnancies  3    Number of Vaginal Deliveries  3    Currently Sexually Active  No    Urinary Leakage  Yes    Pad use  depends out of house, 2 pads per day    Activities that cause leaking  Sneezing;Laughing;Walking;Exercising;With strong urge move fast, walking to commode    Urinary urgency  Yes    Fecal incontinence  No    Falling out feeling (prolapse)  No    Skin Integrity  Intact    Pelvic Floor Internal Exam  Patient confirms identification and approves PT to assess muscle integrity and treat  Exam Type  Vaginal    Sensation  tenderness located on bil. obturator internist,  left ischiococcygeus, tightness of the bladder and urethra    Palpation  initially strength was 2/5 with contraction anterior and posterior, after soft tissue work patient was able to do a circular contraction with a llift    Strength  fair squeeze, definite lift    Strength # of reps  5    Strength # of seconds  5       [facility] Adult PT Treatment/Exercise - 02/20/17 0001      Manual Therapy   Manual Therapy  Internal Pelvic Floor    Internal Pelvic Floor  bil. urethra sphincter, bil. sides of introitus and left ischiococcygeus             PT Education - 02/20/17 1144    Education provided  Yes    Education Details  abdominal massage; pelvic floor exercise in sitting; scar massage    Person(s) Educated  Patient    Methods  Explanation;Demonstration;Verbal cues;Handout    Comprehension  Returned demonstration;Verbalized understanding       PT Short Term Goals - 02/20/17 1204      PT SHORT TERM GOAL #1   Title  independent with initial HEP     Time  4    Period  Weeks    Status  New    Target Date  03/20/17      PT SHORT TERM GOAL #2   Title  understand what bladder irritants are and how they affect her urinary leakage    Time  4    Period  Weeks    Status  New    Target Date  03/20/17      PT SHORT TERM GOAL #3   Title  understand abdominal massage to reduce tender spots in abdomen and promote good bowel heakth    Time  4    Period  Weeks    Status  New    Target Date  03/20/17      PT SHORT TERM GOAL #4   Title  urinary leakage with movement decreased >/= 25% due to improve quality of pelvic floor contraction    Time  4    Period  Weeks    Status  New    Target Date  03/20/17        PT Long Term Goals - 02/20/17 1206      PT LONG TERM GOAL #1   Title  independent with HEP and how to progress herself    Time  8    Period  Weeks    Status  New    Target Date  04/17/17      PT LONG TERM GOAL #2   Title  pelvic floor strength is 4/5 so her urinary  leakage is </= 75%    Time  8    Period  Weeks    Status  New    Target Date  04/17/17      PT LONG TERM GOAL #3   Title  reduce pad use to light day pad 1 time per day and not have to use a depends    Time  8    Period  Weeks    Status  New    Target Date  04/17/17      PT LONG TERM GOAL #4   Title  able to get out of her chair and walk to the commode without urinary leakage  due to improve coordination of the pelvic floor muscles    Time  8    Period  Weeks    Status  New    Target Date  04/17/17             Plan - 02/20/17 1157    Clinical Impression Statement  Patient is a 72 year old female with urinary leakage upon activity and walking to the bathroom.  Patient reports this has become worse in the past 4 months and wearing depends to go out and 2 pads in the house.  Pateint reports no pain.  Abdominoplasty scar is healed with decreased mobility. Tenderness located in upper mid abdominal and tightness in lower abdominal area.  Patient has tenderness located in bilateral obturator internist and left ischioccygeus.  Initial pelvic floor strength is 2/5 and after soft tissue work increased to 3/5 with a circular contraction.  Tightness in the mobility of the bladder and urethra.  Patient reports she is having more diarrhea.  Patient will benefit from skilled therapy to increased pelvic floor strength and coordination to reduce urinary leakage.      History and Personal Factors relevant to plan of care:  Abdominalplasty 02/14/2016; Pelvic Prolapse surgery  for rectocele 1990; hysterectomy 1983    Clinical Presentation  Evolving    Clinical Presentation due to:  urinary leakage has increased in frequency making her wear depends to go out or not go out due to being embarrassed    Clinical Decision Making  Low    Rehab Potential  Excellent    Clinical Impairments Affecting Rehab Potential  Abdominalplasty 02/14/2016; Pelvic Prolapse surgery  for rectocele 1990; hysterectomy 1983    PT  Frequency  1x / week    PT Duration  8 weeks    PT Treatment/Interventions  Biofeedback;Neuromuscular re-education;Patient/family education;Therapeutic exercise;Therapeutic activities;Scar mobilization;Manual techniques    PT Next Visit Plan  lower abdominal exercise; internal soft tissue work    PT Home Exercise Plan  bladder irritants; bowel health    Consulted and Agree with Plan of Care  Patient       Patient will benefit from skilled therapeutic intervention in order to improve the following deficits and impairments:  Increased fascial restricitons, Decreased scar mobility, Increased muscle spasms, Decreased strength, Decreased activity tolerance, Decreased endurance  Visit Diagnosis: Muscle weakness (generalized) - Plan: PT plan of care cert/re-cert  Unspecified lack of coordination - Plan: PT plan of care cert/re-cert  Other muscle spasm - Plan: PT plan of care cert/re-cert     Problem List Patient Active Problem List   Diagnosis Date Noted  . Cluster headache, not intractable 05/04/2016  . History of colonic polyps 2020-08-2514  . CAD (coronary artery disease) 07/23/2014  . Glaucoma (increased eye pressure) 07/30/2013  . OSA on CPAP 07/30/2013  . Obesity, unspecified 07/30/2013  . Obesity (BMI 30-39.9) 03/18/2013  . Abdominal pain, other specified site 03/18/2013  . ERRONEOUS ENCOUNTER--DISREGARD 03/06/2013  . Internal hemorrhoids with other complication 16/11/9602  . Nonspecific abnormal finding in stool contents 11/05/2012  . Abdominal pain, left upper quadrant 11/05/2012  . Diffuse pain 07/24/2012  . Migraine headache without aura 07/23/2012  . Headache, paroxysmal hemicrania, chronic 07/23/2012  . OSA (obstructive sleep apnea) 06/11/2012  . NSTEMI (non-ST elevated myocardial infarction) ([place]) 09/28/2011  . IBS (irritable bowel syndrome) 03/22/2011  . Hyperlipidemia 12/21/2010  . DIARRHEA, CHRONIC 01/13/2010  . COCCYGEAL PAIN 05/11/2009  . Anorectal pain  02/02/2009  . HIP PAIN, LEFT 02/02/2009  .  CORNEAL DISORDER 12/11/2008  . POSTMENOPAUSAL STATUS 12/11/2008  . ADVERSE DRUG REACTION 12/11/2008  . DYSPHAGIA UNSPECIFIED 10/02/2008  . BACK PAIN 07/15/2008  . Dysmetabolic syndrome X 32/99/2426  . OSTEOARTHRITIS, KNEE, RIGHT 06/19/2007  . CARPAL TUNNEL SYNDROME 04/25/2007  . ADENOMATOUS COLONIC POLYP 04/23/2007  . GERD 04/23/2007  . DIVERTICULAR DISEASE 04/23/2007  . HIATAL HERNIA 11/16/2006  . ANXIETY STATE NOS 11/12/2006  . Essential hypertension 10/30/2006    Earlie Counts, PT 02/20/17 12:13 PM   [facility] [address], [place], [place], [address] Phone: [phone]   Fax:  [phone]  Name: TATJANA TURCOTT MRN: [id number] Date of Birth: 01/30/1946

## 2017-02-21 ENCOUNTER — Telehealth: Payer: Self-pay | Admitting: Neurology

## 2017-02-21 ENCOUNTER — Other Ambulatory Visit: Payer: Self-pay | Admitting: Neurology

## 2017-02-21 MED ORDER — ZOLPIDEM TARTRATE 10 MG PO TABS: 10.0000 mg | ORAL_TABLET | Freq: Every evening | ORAL | 0 refills | Status: DC | PRN

## 2017-02-21 NOTE — Telephone Encounter (Signed)
I will send this medication to the pharmacy [facility] for the patient for a 1 month supply

## 2017-02-21 NOTE — Telephone Encounter (Signed)
Pt states that re: zolpidem (AMBIEN) 10 MG tablet  [facility] - [place], [place] [phone] (Phone) [phone] (Fax)   Has told her they will not be able to get to her for 3 weeks, pt would like to know if a 1 month supply can be sent to  [facility], [place] [phone] (Phone) [phone] (Fax)   Please call

## 2017-02-22 ENCOUNTER — Ambulatory Visit (HOSPITAL_COMMUNITY): Payer: Medicare Other | Attending: Cardiovascular Disease

## 2017-02-22 ENCOUNTER — Other Ambulatory Visit: Payer: Self-pay

## 2017-02-22 DIAGNOSIS — G4730 Sleep apnea, unspecified: Secondary | ICD-10-CM | POA: Insufficient documentation

## 2017-02-22 DIAGNOSIS — Z951 Presence of aortocoronary bypass graft: Secondary | ICD-10-CM | POA: Insufficient documentation

## 2017-02-22 DIAGNOSIS — I252 Old myocardial infarction: Secondary | ICD-10-CM | POA: Insufficient documentation

## 2017-02-22 DIAGNOSIS — I119 Hypertensive heart disease without heart failure: Secondary | ICD-10-CM | POA: Insufficient documentation

## 2017-02-22 DIAGNOSIS — I519 Heart disease, unspecified: Secondary | ICD-10-CM | POA: Diagnosis not present

## 2017-02-22 DIAGNOSIS — I2510 Atherosclerotic heart disease of native coronary artery without angina pectoris: Secondary | ICD-10-CM | POA: Diagnosis not present

## 2017-02-22 DIAGNOSIS — I5189 Other ill-defined heart diseases: Secondary | ICD-10-CM | POA: Diagnosis not present

## 2017-02-22 LAB — ECHOCARDIOGRAM COMPLETE
Area-P 1/2: 2.72 cm2
CHL CUP DOP CALC LVOT VTI: 26.5 cm
E decel time: 278 msec
E/e' ratio: 7.79
FS: 39 % (ref 28–44)
IVS/LV PW RATIO, ED: 0.73
LA ID, A-P, ES: 28 mm
LA vol index: 21.6 mL/m2
LA vol: 44 mL
LADIAMINDEX: 1.37 cm/m2
LAVOLA4C: 49.1 mL
LEFT ATRIUM END SYS DIAM: 28 mm
LV TDI E'LATERAL: 8.59
LV TDI E'MEDIAL: 8.16
LVEEAVG: 7.79
LVEEMED: 7.79
LVELAT: 8.59 cm/s
LVOT area: 3.46 cm2
LVOT diameter: 21 mm
LVOT peak grad rest: 5 mmHg
LVOTPV: 113 cm/s
LVOTSV: 92 mL
MV Dec: 278
MVPKAVEL: 82.5 m/s
MVPKEVEL: 66.9 m/s
P 1/2 time: 81 ms
PW: 11 mm — AB (ref 0.6–1.1)
RV LATERAL S' VELOCITY: 10.6 cm/s
RV TAPSE: 18.9 mm
RV sys press: 23 mmHg
Reg peak vel: 221 cm/s
TR max vel: 221 cm/s
TVPG: 221 mmHg

## 2017-02-27 ENCOUNTER — Encounter: Payer: Self-pay | Admitting: Physical Therapy

## 2017-02-27 ENCOUNTER — Ambulatory Visit: Payer: Medicare Other | Admitting: Physical Therapy

## 2017-02-27 DIAGNOSIS — M62838 Other muscle spasm: Secondary | ICD-10-CM | POA: Diagnosis not present

## 2017-02-27 DIAGNOSIS — R279 Unspecified lack of coordination: Secondary | ICD-10-CM

## 2017-02-27 DIAGNOSIS — M6281 Muscle weakness (generalized): Secondary | ICD-10-CM

## 2017-02-27 NOTE — Patient Instructions (Addendum)
Isometric Hold (Hook-Lying)    Lie with hips and knees bent. Slowly inhale, and then exhale. Pull navel toward spine and Hold for _5__ seconds. Continue to breathe in and out during hold. Rest for _5__ seconds. Repeat _10__ times. Do __1_ times a day.   Copyright  VHI. All rights reser  Introduction to [place] and daily habits can help you predict when your bowels will move on a regular basis.  The consistency and quantity of the stool is usually more important than the frequency.  The goal is to have a regular bowel movement that is soft but formed.   Tips on Emptying Regularly . Eat breakfast.  Usually the best time of day for a bowel movement will be a half hour to an hour after eating.  These times are best because the body uses the gastrocolic reflex, a stimulation of bowel motion that occurs with eating, to help produce a bowel movement.  For some people even a simple hot drink in the morning can help the reflex action begin. . Eat all your meals at a predictable time each day.  The bowel functions best when food is introduced at the same regular intervals. . The amount of food eaten at a given time of day should be about the same size from day to day.  The bowel functions best when food is introduced in similar quantities from day to day. It is fine to have a small breakfast and a large lunch, or vice versa, just be consistent. . Eat two servings of fruit or vegetables and at least one serving of a complex carbohydrates (whole grains such as brown rice, bran, whole wheat bread, or oatmeal) at each meal. . Drink plenty of water-ideally eight glasses a day.  Be sure to increase your water intake if you are increasing fiber into your diet.  Maintain Healthy Habits . Exercise daily.  You may exercise at any time of day, but you may find that bowel function is helped most if the exercise is at a consistent time each day. . Make sure that you are not rushed and have convenient access  to a bathroom at your selected time to empty your bowels.     Walk 15 min per day    Slow Contraction: Gravity Eliminated (Hook-Lying)    Lie with hips and knees bent. Slowly squeeze pelvic floor while breathing out  for __5_ seconds. Rest for __5_ seconds. Repeat _10__ times. Do _3__ times a day.   Copyright  VHI. All rights reserved.  [facility] [address] [place], [place] [address] Phone # [phone] Fax [phone]

## 2017-02-27 NOTE — Therapy (Signed)
[facility] [address], [place], [place], [address] Phone: [phone]   Fax:  [phone]  Physical Therapy Treatment  Patient Details  Name: Connie Owens MRN: [id number] Date of Birth: 1945-09-14 Referring Provider: Dr. Bobbye Charleston   Encounter Date: 02/27/2017  PT End of Session - 02/27/17 1243    Visit Number  2    Date for PT Re-Evaluation  04/17/17    Authorization Type  medicare    PT Start Time  1145    PT Stop Time  1230    PT Time Calculation (min)  45 min    Activity Tolerance  Patient tolerated treatment well    Behavior During Therapy  [facility] for tasks assessed/performed       Past Medical History:  Diagnosis Date  . Anxiety state    NOS  . Arthritis    osteo...right knee  . Back pain   . CAD (coronary artery disease)    NSTEMI 8/13 => LHC 09/27/11: oLM 50%, dLM 30%, oLAD 60-70%, pLAD 50%, mLAD 70%, pD1 30%, pD2 40%, oRCA 25%, mRCA 40%, EF 50% with ant HK => LAD not amenable to PCI => med Rx  unless recurrent angina = > consider CABG  . Carpal tunnel syndrome   . Corneal disorder   . Diverticular disease   . Dysmetabolic syndrome X   . GERD (gastroesophageal reflux disease)   . 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   [name] Headache, paroxysmal hemicrania, chronic 07/23/2012    Referral to BOTOX evaluation with dr Rexene Alberts or Krista Blue .  [name] Heart attack ([place]) 09/2011  . Hernia    hiatal  . HLD (hyperlipidemia)   . Hx of adenomatous colonic polyps   . Hypertension    essen. NOS  . Ischemic cardiomyopathy    a.  Echo 09/29/11: EF 30-35% with inferior, posterior, lateral AK, anterior severe HK, mild LVH, mild MR, PASP 40;  => b. follow up echo 10/13:  EF 55-60%, Gr 1 diast dysfn, mild MR  . Migraine headache without aura 07/23/2012    Left sided  Lamonte Sakai thought to be migrainous  , hypersensitivity to touch , photophobia ,  no nausea.   [name]  Postmenopausal status   . Shoulder fracture   . Sleep apnea    CPAP    Past Surgical History:  Procedure Laterality Date  . ABDOMINAL HYSTERECTOMY    . ankle cystectomy Right   . APPENDECTOMY    . BREAST EXCISIONAL BIOPSY Right 02/15/1994  . BREAST LUMPECTOMY Right 1996  . BUNIONECTOMY Right   . CARDIAC CATHETERIZATION  09/28/2011   Dr Acie Fredrickson(  to see Dr Ron Parker)  . CHOLECYSTECTOMY    . COLONOSCOPY W/ POLYPECTOMY    . 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 CORNEAL TRANSPLANT Left    x1  . HEMORRHOID SURGERY  12/01/2010, 2014   [place] hemorrhoid ligation/pexy  . LEFT HEART CATHETERIZATION WITH CORONARY ANGIOGRAM N/A 09/28/2011   Procedure: LEFT HEART CATHETERIZATION WITH CORONARY ANGIOGRAM;  Surgeon: Minus Breeding, MD;  Location: [facility] CATH LAB;  Service: Cardiovascular;  Laterality: N/A;  . lumbar spur removed    . rectocele surgery    . TOTAL KNEE ARTHROPLASTY Right     There were no vitals filed for this visit.  Subjective Assessment - 02/27/17 1151    Subjective  I have been  doing my exercises.     Patient Stated Goals  be able to get to the bathroom without dribbling, not have to wear a pad out of the house    Currently in Pain?  No/denies    Multiple Pain Sites  No                   Pelvic Floor Special Questions - 02/27/17 0001    Pelvic Floor Internal Exam  Patient confirms identification and approves PT to assess muscle integrity and treat    Exam Type  Vaginal    Sensation  tenderness located on bil. obturator internist, left ischiococcygeus, tightness of the bladder and urethra    Strength  fair squeeze, definite lift        [facility] Adult PT Treatment/Exercise - 02/27/17 0001      Manual Therapy   Manual Therapy  Internal Pelvic Floor;Soft tissue mobilization    Soft tissue mobilization  scar massage on lower part of abdomen    Internal Pelvic Floor  bil. obturator internist and levaotor ani with less tenderness  afterwards             PT Education - 02/27/17 1240    Education provided  Yes    Education Details  pelvic floor contraction; bowel health; abdominal bracing; walking program    Person(s) Educated  Patient    Methods  Demonstration;Explanation;Verbal cues;Handout    Comprehension  Returned demonstration;Verbalized understanding       PT Short Term Goals - 02/27/17 1246      PT SHORT TERM GOAL #1   Title  independent with initial HEP     Time  4    Period  Weeks    Status  On-going      PT SHORT TERM GOAL #2   Title  understand what bladder irritants are and how they affect her urinary leakage    Time  4    Period  Weeks    Status  Achieved      PT SHORT TERM GOAL #3   Title  understand abdominal massage to reduce tender spots in abdomen and promote good bowel heakth    Time  4    Period  Weeks    Status  Achieved      PT SHORT TERM GOAL #4   Title  urinary leakage with movement decreased >/= 25% due to improve quality of pelvic floor contraction    Time  4    Period  Weeks    Status  On-going        PT Long Term Goals - 02/20/17 1206      PT LONG TERM GOAL #1   Title  independent with HEP and how to progress herself    Time  8    Period  Weeks    Status  New    Target Date  04/17/17      PT LONG TERM GOAL #2   Title  pelvic floor strength is 4/5 so her urinary leakage is </= 75%    Time  8    Period  Weeks    Status  New    Target Date  04/17/17      PT LONG TERM GOAL #3   Title  reduce pad use to light day pad 1 time per day and not have to use a depends    Time  8    Period  Weeks    Status  New  Target Date  04/17/17      PT LONG TERM GOAL #4   Title  able to get out of her chair and walk to the commode without urinary leakage due to improve coordination of the pelvic floor muscles    Time  8    Period  Weeks    Status  New    Target Date  04/17/17            Plan - 02/27/17 1243    Clinical Impression Statement  Patient  needs tactile cues to correctly contract the abdominals due to her trying to posteriorly rotated the pelvis.  Patient has difficulty with contracting the pelvic floor without contracting the gluteal.  Patient had several trigger points in the pelvic floor muscles. Patient understands bowel health and how to incorporate more fiber in her diet. Patient will benefit from skilled therapy to increase pelvic floor strength and coordination to reduce urinary leakage.     Rehab Potential  Excellent    Clinical Impairments Affecting Rehab Potential  Abdominalplasty 02/14/2016; Pelvic Prolapse surgery  for rectocele 1990; hysterectomy 1983    PT Frequency  1x / week    PT Duration  8 weeks    PT Treatment/Interventions  Biofeedback;Neuromuscular re-education;Patient/family education;Therapeutic exercise;Therapeutic activities;Scar mobilization;Manual techniques    PT Next Visit Plan  lower abdominal exercise; internal soft tissue work; hip strength for abduction and extension    PT Home Exercise Plan  progress abdominals with clam    Consulted and Agree with Plan of Care  Patient       Patient will benefit from skilled therapeutic intervention in order to improve the following deficits and impairments:  Increased fascial restricitons, Decreased scar mobility, Increased muscle spasms, Decreased strength, Decreased activity tolerance, Decreased endurance  Visit Diagnosis: Muscle weakness (generalized)  Unspecified lack of coordination  Other muscle spasm     Problem List Patient Active Problem List   Diagnosis Date Noted  . Cluster headache, not intractable 05/04/2016  . History of colonic polyps 04-19-202016  . CAD (coronary artery disease) 07/23/2014  . Glaucoma (increased eye pressure) 07/30/2013  . OSA on CPAP 07/30/2013  . Obesity, unspecified 07/30/2013  . Obesity (BMI 30-39.9) 03/18/2013  . Abdominal pain, other specified site 03/18/2013  . ERRONEOUS ENCOUNTER--DISREGARD 03/06/2013  .  Internal hemorrhoids with other complication 02/54/2706  . Nonspecific abnormal finding in stool contents 11/05/2012  . Abdominal pain, left upper quadrant 11/05/2012  . Diffuse pain 07/24/2012  . Migraine headache without aura 07/23/2012  . Headache, paroxysmal hemicrania, chronic 07/23/2012  . OSA (obstructive sleep apnea) 06/11/2012  . NSTEMI (non-ST elevated myocardial infarction) ([place]) 09/28/2011  . IBS (irritable bowel syndrome) 03/22/2011  . Hyperlipidemia 12/21/2010  . DIARRHEA, CHRONIC 01/13/2010  . COCCYGEAL PAIN 05/11/2009  . Anorectal pain 02/02/2009  . HIP PAIN, LEFT 02/02/2009  . CORNEAL DISORDER 12/11/2008  . POSTMENOPAUSAL STATUS 12/11/2008  . ADVERSE DRUG REACTION 12/11/2008  . DYSPHAGIA UNSPECIFIED 10/02/2008  . BACK PAIN 07/15/2008  . Dysmetabolic syndrome X 23/76/2831  . OSTEOARTHRITIS, KNEE, RIGHT 06/19/2007  . CARPAL TUNNEL SYNDROME 04/25/2007  . ADENOMATOUS COLONIC POLYP 04/23/2007  . GERD 04/23/2007  . DIVERTICULAR DISEASE 04/23/2007  . HIATAL HERNIA 11/16/2006  . ANXIETY STATE NOS 11/12/2006  . Essential hypertension 10/30/2006    Earlie Counts, PT 02/27/17 12:47 PM   [facility] [address], [place], [place], [address] Phone: [phone]   Fax:  [phone]  Name: Connie Owens MRN: [id number]  Date of Birth: 1945-07-12

## 2017-03-06 ENCOUNTER — Ambulatory Visit: Payer: Medicare Other | Admitting: Physical Therapy

## 2017-03-06 DIAGNOSIS — R279 Unspecified lack of coordination: Secondary | ICD-10-CM

## 2017-03-06 DIAGNOSIS — M62838 Other muscle spasm: Secondary | ICD-10-CM | POA: Diagnosis not present

## 2017-03-06 DIAGNOSIS — M6281 Muscle weakness (generalized): Secondary | ICD-10-CM

## 2017-03-06 NOTE — Therapy (Signed)
[facility] [address], [place], [place], [address] Phone: [phone]   Fax:  [phone]  Physical Therapy Treatment  Patient Details  Name: Connie Owens MRN: [id number] Date of Birth: Jan 31, 1946 Referring Provider: Dr. Bobbye Charleston   Encounter Date: 03/06/2017  PT End of Session - 03/06/17 1227    Visit Number  3    Date for PT Re-Evaluation  04/17/17    Authorization Type  medicare    PT Start Time  1145    PT Stop Time  1223    PT Time Calculation (min)  38 min    Activity Tolerance  Patient tolerated treatment well    Behavior During Therapy  [facility] for tasks assessed/performed       Past Medical History:  Diagnosis Date  . Anxiety state    NOS  . Arthritis    osteo...right knee  . Back pain   . CAD (coronary artery disease)    NSTEMI 8/13 => LHC 09/27/11: oLM 50%, dLM 30%, oLAD 60-70%, pLAD 50%, mLAD 70%, pD1 30%, pD2 40%, oRCA 25%, mRCA 40%, EF 50% with ant HK => LAD not amenable to PCI => med Rx  unless recurrent angina = > consider CABG  . Carpal tunnel syndrome   . Corneal disorder   . Diverticular disease   . Dysmetabolic syndrome X   . GERD (gastroesophageal reflux disease)   . 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   [name] Headache, paroxysmal hemicrania, chronic 07/23/2012    Referral to BOTOX evaluation with dr Rexene Alberts or Krista Blue .  [name] Heart attack ([place]) 09/2011  . Hernia    hiatal  . HLD (hyperlipidemia)   . Hx of adenomatous colonic polyps   . Hypertension    essen. NOS  . Ischemic cardiomyopathy    a.  Echo 09/29/11: EF 30-35% with inferior, posterior, lateral AK, anterior severe HK, mild LVH, mild MR, PASP 40;  => b. follow up echo 10/13:  EF 55-60%, Gr 1 diast dysfn, mild MR  . Migraine headache without aura 07/23/2012    Left sided  Lamonte Sakai thought to be migrainous  , hypersensitivity to touch , photophobia ,  no nausea.   [name]  Postmenopausal status   . Shoulder fracture   . Sleep apnea    CPAP    Past Surgical History:  Procedure Laterality Date  . ABDOMINAL HYSTERECTOMY    . ankle cystectomy Right   . APPENDECTOMY    . BREAST EXCISIONAL BIOPSY Right 02/15/1994  . BREAST LUMPECTOMY Right 1996  . BUNIONECTOMY Right   . CARDIAC CATHETERIZATION  09/28/2011   Dr Acie Fredrickson(  to see Dr Ron Parker)  . CHOLECYSTECTOMY    . COLONOSCOPY W/ POLYPECTOMY    . 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 CORNEAL TRANSPLANT Left    x1  . HEMORRHOID SURGERY  12/01/2010, 2014   [place] hemorrhoid ligation/pexy  . LEFT HEART CATHETERIZATION WITH CORONARY ANGIOGRAM N/A 09/28/2011   Procedure: LEFT HEART CATHETERIZATION WITH CORONARY ANGIOGRAM;  Surgeon: Minus Breeding, MD;  Location: [facility] CATH LAB;  Service: Cardiovascular;  Laterality: N/A;  . lumbar spur removed    . rectocele surgery    . TOTAL KNEE ARTHROPLASTY Right     There were no vitals filed for this visit.  Subjective Assessment - 03/06/17 1146    Subjective  I have been  doing my routine.  I think the urge is not hitting as much and I can hold the urine longer. I am able to wait 45 min to one hour.     Patient Stated Goals  be able to get to the bathroom without dribbling, not have to wear a pad out of the house    Currently in Pain?  No/denies    Multiple Pain Sites  No                      [facility] Adult PT Treatment/Exercise - 03/06/17 0001      Exercises   Exercises  Other Exercises    Other Exercises   sidely hip abduction for left 15 times      Lumbar Exercises: Supine   Ab Set  10 reps;5 seconds    Clam  15 reps    Bent Knee Raise  20 reps;1 second               PT Short Term Goals - 03/06/17 1214      PT SHORT TERM GOAL #1   Title  independent with initial HEP     Time  4    Period  Weeks    Status  Achieved      PT SHORT TERM GOAL #2   Title  understand what bladder irritants are and how  they affect her urinary leakage    Time  4    Period  Weeks    Status  Achieved      PT SHORT TERM GOAL #3   Title  understand abdominal massage to reduce tender spots in abdomen and promote good bowel heakth    Time  4    Period  Weeks    Status  Achieved      PT SHORT TERM GOAL #4   Title  urinary leakage with movement decreased >/= 25% due to improve quality of pelvic floor contraction    Baseline  15% better    Time  4    Period  Weeks    Status  On-going        PT Long Term Goals - 02/20/17 1206      PT LONG TERM GOAL #1   Title  independent with HEP and how to progress herself    Time  8    Period  Weeks    Status  New    Target Date  04/17/17      PT LONG TERM GOAL #2   Title  pelvic floor strength is 4/5 so her urinary leakage is </= 75%    Time  8    Period  Weeks    Status  New    Target Date  04/17/17      PT LONG TERM GOAL #3   Title  reduce pad use to light day pad 1 time per day and not have to use a depends    Time  8    Period  Weeks    Status  New    Target Date  04/17/17      PT LONG TERM GOAL #4   Title  able to get out of her chair and walk to the commode without urinary leakage due to improve coordination of the pelvic floor muscles    Time  8    Period  Weeks    Status  New    Target Date  04/17/17  Plan - 03/06/17 1151    Clinical Impression Statement  Patient reports her urinary leakage has improved by 15%.  Patient is going to the bathroom every 45 minutes to 1 hour now to empty her bladder more frequently. Patient will fill out a bladder diary to see how frequently she is going.  Patient is able to contract her abdominals with increased coordination.  Patient is not able to do hip abduction in sidely due to pain in right knee.  Patient will benefit from skilled therapy to increase pelvic floor strength and coordinaiton to reduce urinary leakage.     Clinical Impairments Affecting Rehab Potential  Abdominalplasty 02/14/2016;  Pelvic Prolapse surgery  for rectocele 1990; hysterectomy 1983    PT Frequency  1x / week    PT Duration  8 weeks    PT Treatment/Interventions  Biofeedback;Neuromuscular re-education;Patient/family education;Therapeutic exercise;Therapeutic activities;Scar mobilization;Manual techniques    PT Next Visit Plan  lower abdominal exercise; internal soft tissue work;  increase walking time ; review bladder diary    PT Home Exercise Plan  progress as needed    Recommended Other Services  MD did not sign initial summary yet, check next week and not signed route to MD    Consulted and Agree with Plan of Care  Patient       Patient will benefit from skilled therapeutic intervention in order to improve the following deficits and impairments:  Increased fascial restricitons, Decreased scar mobility, Increased muscle spasms, Decreased strength, Decreased activity tolerance, Decreased endurance  Visit Diagnosis: Muscle weakness (generalized)  Unspecified lack of coordination  Other muscle spasm     Problem List Patient Active Problem List   Diagnosis Date Noted  . Cluster headache, not intractable 05/04/2016  . History of colonic polyps June 23, 202016  . CAD (coronary artery disease) 07/23/2014  . Glaucoma (increased eye pressure) 07/30/2013  . OSA on CPAP 07/30/2013  . Obesity, unspecified 07/30/2013  . Obesity (BMI 30-39.9) 03/18/2013  . Abdominal pain, other specified site 03/18/2013  . ERRONEOUS ENCOUNTER--DISREGARD 03/06/2013  . Internal hemorrhoids with other complication 58/52/7782  . Nonspecific abnormal finding in stool contents 11/05/2012  . Abdominal pain, left upper quadrant 11/05/2012  . Diffuse pain 07/24/2012  . Migraine headache without aura 07/23/2012  . Headache, paroxysmal hemicrania, chronic 07/23/2012  . OSA (obstructive sleep apnea) 06/11/2012  . NSTEMI (non-ST elevated myocardial infarction) ([place]) 09/28/2011  . IBS (irritable bowel syndrome) 03/22/2011  .  Hyperlipidemia 12/21/2010  . DIARRHEA, CHRONIC 01/13/2010  . COCCYGEAL PAIN 05/11/2009  . Anorectal pain 02/02/2009  . HIP PAIN, LEFT 02/02/2009  . CORNEAL DISORDER 12/11/2008  . POSTMENOPAUSAL STATUS 12/11/2008  . ADVERSE DRUG REACTION 12/11/2008  . DYSPHAGIA UNSPECIFIED 10/02/2008  . BACK PAIN 07/15/2008  . Dysmetabolic syndrome X 42/35/3614  . OSTEOARTHRITIS, KNEE, RIGHT 06/19/2007  . CARPAL TUNNEL SYNDROME 04/25/2007  . ADENOMATOUS COLONIC POLYP 04/23/2007  . GERD 04/23/2007  . DIVERTICULAR DISEASE 04/23/2007  . HIATAL HERNIA 11/16/2006  . ANXIETY STATE NOS 11/12/2006  . Essential hypertension 10/30/2006    Earlie Counts, PT 03/06/17 12:28 PM   [facility] [address] [place], [place], [address] Phone: [phone]   Fax:  [phone]  Name: Connie Owens MRN: [id number] Date of Birth: 1945-08-14

## 2017-03-06 NOTE — Patient Instructions (Addendum)
Fill out bladder diary.   Hip Abduction (Standing)    Stand with support. Squeeze pelvic floor and hold. Lift right leg out to side, keeping toe forward. Hold for __1_ seconds. Relax for _1__ seconds.  Repeat _10__ times. Do _1__ times a day.  Copyright  VHI. All rights reserved.  Hip Extension (Standing)    Stand with support. Squeeze pelvic floor and hold. Move right leg backward with straight knee. Hold for __1_ seconds. Relax for __1_ seconds. Repeat _10__ times. Do __1_ times a day.  Copyright  VHI. All rights reserved.  Mini-Squats (Standing)    Stand with support. Bend knees slightly. Stick your bottom back. Tighten pelvic floor. Hold for __1_ seconds. Return to straight standing. Relax for _1__ seconds. Repeat 10___ times. Do _1__ times a day.  Copyright  VHI. All rights reserved.   Heel Raises    Stand with support. Tighten pelvic floor and hold. With knees straight, raise heels off ground. Hold _1__ seconds. Relax for _1__ seconds. Repeat _15__ times. Do __1_ times a day.  Copyright  VHI. All rights reserved.    While lying on your side with your knees bent, draw up the top knee while keeping contact of your feet together.  Do not let your pelvis roll back during the lifting movement.   Earlie Counts, PT 10 times 1 time per day.  [facility] [address] [place], [place] [address] Phone # [phone] Fax [phone]

## 2017-03-08 ENCOUNTER — Ambulatory Visit: Payer: Medicare Other | Admitting: Cardiology

## 2017-03-13 ENCOUNTER — Encounter: Payer: Medicare Other | Admitting: Physical Therapy

## 2017-03-27 ENCOUNTER — Encounter: Payer: Self-pay | Admitting: Physical Therapy

## 2017-03-27 ENCOUNTER — Ambulatory Visit: Payer: Medicare Other | Attending: Obstetrics and Gynecology | Admitting: Physical Therapy

## 2017-03-27 DIAGNOSIS — R279 Unspecified lack of coordination: Secondary | ICD-10-CM | POA: Diagnosis not present

## 2017-03-27 DIAGNOSIS — M6281 Muscle weakness (generalized): Secondary | ICD-10-CM | POA: Insufficient documentation

## 2017-03-27 DIAGNOSIS — M62838 Other muscle spasm: Secondary | ICD-10-CM | POA: Insufficient documentation

## 2017-03-27 DIAGNOSIS — M25512 Pain in left shoulder: Secondary | ICD-10-CM | POA: Insufficient documentation

## 2017-03-27 DIAGNOSIS — M25511 Pain in right shoulder: Secondary | ICD-10-CM | POA: Diagnosis not present

## 2017-03-27 NOTE — Therapy (Signed)
[facility] [address] [place], [place], [address] Phone: [phone]   Fax:  [phone]  Physical Therapy Treatment  Patient Details  Name: Connie Owens MRN: [id number] Date of Birth: 1945/07/28 Referring Provider: Dr. Bobbye Charleston   Encounter Date: 03/27/2017  PT End of Session - 03/27/17 1150    Visit Number  4    Date for PT Re-Evaluation  04/17/17    Authorization Type  medicare    PT Start Time  1145    PT Stop Time  1225    PT Time Calculation (min)  40 min    Activity Tolerance  Patient tolerated treatment well    Behavior During Therapy  [facility] for tasks assessed/performed       Past Medical History:  Diagnosis Date  . Anxiety state    NOS  . Arthritis    osteo...right knee  . Back pain   . CAD (coronary artery disease)    NSTEMI 8/13 => LHC 09/27/11: oLM 50%, dLM 30%, oLAD 60-70%, pLAD 50%, mLAD 70%, pD1 30%, pD2 40%, oRCA 25%, mRCA 40%, EF 50% with ant HK => LAD not amenable to PCI => med Rx  unless recurrent angina = > consider CABG  . Carpal tunnel syndrome   . Corneal disorder   . Diverticular disease   . Dysmetabolic syndrome X   . GERD (gastroesophageal reflux disease)   . 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   [name] Headache, paroxysmal hemicrania, chronic 07/23/2012    Referral to BOTOX evaluation with dr Rexene Alberts or Krista Blue .  [name] Heart attack ([place]) 09/2011  . Hernia    hiatal  . HLD (hyperlipidemia)   . Hx of adenomatous colonic polyps   . Hypertension    essen. NOS  . Ischemic cardiomyopathy    a.  Echo 09/29/11: EF 30-35% with inferior, posterior, lateral AK, anterior severe HK, mild LVH, mild MR, PASP 40;  => b. follow up echo 10/13:  EF 55-60%, Gr 1 diast dysfn, mild MR  . Migraine headache without aura 07/23/2012    Left sided  Lamonte Sakai thought to be migrainous  , hypersensitivity to touch , photophobia ,  no nausea.   [name]  Postmenopausal status   . Shoulder fracture   . Sleep apnea    CPAP    Past Surgical History:  Procedure Laterality Date  . ABDOMINAL HYSTERECTOMY    . ankle cystectomy Right   . APPENDECTOMY    . BREAST EXCISIONAL BIOPSY Right 02/15/1994  . BREAST LUMPECTOMY Right 1996  . BUNIONECTOMY Right   . CARDIAC CATHETERIZATION  09/28/2011   Dr Acie Fredrickson(  to see Dr Ron Parker)  . CHOLECYSTECTOMY    . COLONOSCOPY W/ POLYPECTOMY    . 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 CORNEAL TRANSPLANT Left    x1  . HEMORRHOID SURGERY  12/01/2010, 2014   [place] hemorrhoid ligation/pexy  . LEFT HEART CATHETERIZATION WITH CORONARY ANGIOGRAM N/A 09/28/2011   Procedure: LEFT HEART CATHETERIZATION WITH CORONARY ANGIOGRAM;  Surgeon: Minus Breeding, MD;  Location: [facility] CATH LAB;  Service: Cardiovascular;  Laterality: N/A;  . lumbar spur removed    . rectocele surgery    . TOTAL KNEE ARTHROPLASTY Right     There were no vitals filed for this visit.  Subjective Assessment - 03/27/17 1148    Subjective  No pain.  I can hold my urine longer and I am practicing. I have been doing alot of walking. Urinary leakage has improved. I can wait 1 hour prior to going to bathroom.  Coffee irritates my bladder.     Patient Stated Goals  be able to get to the bathroom without dribbling, not have to wear a pad out of the house    Currently in Pain?  No/denies                   Pelvic Floor Special Questions - 03/27/17 0001    Pelvic Floor Internal Exam  Patient confirms identification and approves PT to assess muscle integrity and treat    Exam Type  Vaginal    Sensation  tenderness located on bil. obturator internist, left ischiococcygeus, tightness of the bladder and urethra    Strength  fair squeeze, definite lift        [facility] Adult PT Treatment/Exercise - 03/27/17 0001      Lumbar Exercises: Aerobic   Nustep  6 min, level 2 arm and legs      Lumbar Exercises: Standing    Other Standing Lumbar Exercises  hip extension and abduction with green band 2x10 with VC to not tilt trunk and contract the pelvic floor      Lumbar Exercises: Seated   Sit to Stand  10 reps    Sit to Stand Limitations  green band around knees with VC to fully extens her hips      Manual Therapy   Manual Therapy  Soft tissue mobilization;Internal Pelvic Floor    Soft tissue mobilization  suprapubic bone where the abdominals insert    Internal Pelvic Floor  bilateral sides of the urethra and bladder working on the suprapubic fascia             PT Education - 03/27/17 1210    Education provided  Yes    Education Details  hip exercises with green band    Person(s) Educated  Patient    Methods  Explanation;Demonstration;Verbal cues;Handout    Comprehension  Verbalized understanding;Returned demonstration       PT Short Term Goals - 03/27/17 1154      PT SHORT TERM GOAL #4   Title  urinary leakage with movement decreased >/= 25% due to improve quality of pelvic floor contraction    Time  4    Period  Weeks    Status  Achieved        PT Long Term Goals - 03/27/17 1156      PT LONG TERM GOAL #3   Title  reduce pad use to light day pad 1 time per day and not have to use a depends    Baseline  not using around house, wears pad when she goes out    Time  8    Period  Weeks    Status  On-going      PT LONG TERM GOAL #4   Title  able to get out of her chair and walk to the commode without urinary leakage due to improve coordination of the pelvic floor muscles    Time  8    Period  Weeks    Status  New            Plan - 03/27/17 1151    Clinical Impression Statement  Patient reports no pain. Patient reports urinary leakage decreased by 25%.  Patient has decreased puffiness suprapubically after therapy.  Patient understands her  bladder irritants.  Patient is working on her hip strength with green band.  Patient will benefit from skilled therapy to increases pelvic  floor strength and coordination to reduce urinary leakage.     Rehab Potential  Excellent    Clinical Impairments Affecting Rehab Potential  Abdominalplasty 02/14/2016; Pelvic Prolapse surgery  for rectocele 1990; hysterectomy 1983    PT Frequency  1x / week    PT Duration  8 weeks    PT Treatment/Interventions  Biofeedback;Neuromuscular re-education;Patient/family education;Therapeutic exercise;Therapeutic activities;Scar mobilization;Manual techniques    PT Next Visit Plan  lower abdominal exercise;  increase walking time ;    PT Home Exercise Plan  progress as needed    Recommended Other Services  second attempt to sign initial summary    Consulted and Agree with Plan of Care  Patient       Patient will benefit from skilled therapeutic intervention in order to improve the following deficits and impairments:  Increased fascial restricitons, Decreased scar mobility, Increased muscle spasms, Decreased strength, Decreased activity tolerance, Decreased endurance  Visit Diagnosis: Muscle weakness (generalized)  Unspecified lack of coordination  Other muscle spasm     Problem List Patient Active Problem List   Diagnosis Date Noted  . Cluster headache, not intractable 05/04/2016  . History of colonic polyps 2020/09/314  . CAD (coronary artery disease) 07/23/2014  . Glaucoma (increased eye pressure) 07/30/2013  . OSA on CPAP 07/30/2013  . Obesity, unspecified 07/30/2013  . Obesity (BMI 30-39.9) 03/18/2013  . Abdominal pain, other specified site 03/18/2013  . ERRONEOUS ENCOUNTER--DISREGARD 03/06/2013  . Internal hemorrhoids with other complication 11/29/5100  . Nonspecific abnormal finding in stool contents 11/05/2012  . Abdominal pain, left upper quadrant 11/05/2012  . Diffuse pain 07/24/2012  . Migraine headache without aura 07/23/2012  . Headache, paroxysmal hemicrania, chronic 07/23/2012  . OSA (obstructive sleep apnea) 06/11/2012  . NSTEMI (non-ST elevated myocardial  infarction) ([place]) 09/28/2011  . IBS (irritable bowel syndrome) 03/22/2011  . Hyperlipidemia 12/21/2010  . DIARRHEA, CHRONIC 01/13/2010  . COCCYGEAL PAIN 05/11/2009  . Anorectal pain 02/02/2009  . HIP PAIN, LEFT 02/02/2009  . CORNEAL DISORDER 12/11/2008  . POSTMENOPAUSAL STATUS 12/11/2008  . ADVERSE DRUG REACTION 12/11/2008  . DYSPHAGIA UNSPECIFIED 10/02/2008  . BACK PAIN 07/15/2008  . Dysmetabolic syndrome X 58/52/7782  . OSTEOARTHRITIS, KNEE, RIGHT 06/19/2007  . CARPAL TUNNEL SYNDROME 04/25/2007  . ADENOMATOUS COLONIC POLYP 04/23/2007  . GERD 04/23/2007  . DIVERTICULAR DISEASE 04/23/2007  . HIATAL HERNIA 11/16/2006  . ANXIETY STATE NOS 11/12/2006  . Essential hypertension 10/30/2006    Connie Owens, PT 03/27/17 12:30 PM   [facility] [address] [place], [place], [address] Phone: [phone]   Fax:  [phone]  Name: Connie Owens MRN: [id number] Date of Birth: Nov 24, 1945

## 2017-03-27 NOTE — Patient Instructions (Signed)
   Place loop around thighs just above the knees. Gently press out with the knees and come to standing 10 times 1 time per day    Sit down when placing band around ankles or knees. Do not tilt trunk to one side. Pull up pelvic floor.  Keep knee straight and foot pointed forward while stretching ____green______ band 2 sets  For 10 on both legs.     While standing with an elastic band looped around your ankles, move the target leg back as shown.  Keep knee straight. Pull up pelvic floor.  Keep your knees straight the entire time.  10 times for 2 sets 1 time per day.   [facility] [address], [place], [place] [address] Phone # [phone] Fax [phone]

## 2017-04-05 ENCOUNTER — Ambulatory Visit (INDEPENDENT_AMBULATORY_CARE_PROVIDER_SITE_OTHER): Payer: Medicare Other | Admitting: Neurology

## 2017-04-05 ENCOUNTER — Encounter: Payer: Self-pay | Admitting: Neurology

## 2017-04-05 VITALS — BP 120/71 | HR 74 | Ht 65.0 in | Wt 195.0 lb

## 2017-04-05 DIAGNOSIS — G4733 Obstructive sleep apnea (adult) (pediatric): Secondary | ICD-10-CM | POA: Diagnosis not present

## 2017-04-05 DIAGNOSIS — G44229 Chronic tension-type headache, not intractable: Secondary | ICD-10-CM | POA: Diagnosis not present

## 2017-04-05 DIAGNOSIS — Z9989 Dependence on other enabling machines and devices: Secondary | ICD-10-CM | POA: Diagnosis not present

## 2017-04-05 MED ORDER — ZOLPIDEM TARTRATE ER 12.5 MG PO TBCR: 12.5000 mg | EXTENDED_RELEASE_TABLET | Freq: Every evening | ORAL | 1 refills | Status: DC | PRN

## 2017-04-05 NOTE — Progress Notes (Signed)
[facility]                                             SLEEP MEDICINE CLINIC   PATIENT: Connie Owens DOB: [birth year]   REASON FOR VISIT: Follow-up for migraine and OSA on CPAP.    HISTORY FROM: patient alone      CM; Connie Owens is a 72 year old female with history of OSA on CPAP and migraines. She returns today for follow-up. She states that her headaches have improved on nortriptyline. She states that she has approximately one headache a week. These headaches, last 30-40 minutes. They normally occur on the left side of the head. Denies photophobia or phonophobia. Denies nausea or vomiting. She states that if she relaxes and tries to rest her headache resolves quickly. She states that she uses the CPAP nightly. 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 HISTORY 04/30/14: Connie Owens is a 72 year old female with history of obstructive sleep Apnea and migraines. She returns today for a 90 day compliance download. She brought her machine with her today and the reports shows an AHI of 0.3at 6 cm of water, uses her machine for 5 hours and 57 minutes a night, with 100 % compliance. She uses her machine for greater than 4 hours 74 out of 90 days with compliance of 82%. Her Epworth score is 7 points was previously 4 points. Her fatigue severity score is 41 was previously 53.. Patient reports that she gets about 9 hours of sleep a night. She goes to bed around 11PM and arises at 8:15AM. She denies having trouble falling asleep or staying a sleep. States that the she gets up about 1-2 times a night to urinate. Overall patient feels that CPAP has improved her sleepiness and fatigue. The patient states that her migraines have improved. She continues to take nortriptyline 30 mg at bedtime. She has approximately 1-2 headaches per week. She will occassionally have sharp pain that is on the left side of the head that last for  seconds and then will resolve. Overall she feels that her headaches have improved. Patient continues to suffer from anxiety and depression due to the loss of her husband. They were married 65 years.  Interval history from 03/22/2017CD Connie Owens is here today doing better in terms of her migraine headaches without aura and she seems to tolerate amitriptyline very well, but she does have morning dry mouth and she has a history of glaucoma which needs to be considered. In addition we are here to have a compliance visit for CPAP and her overall compliance is 87% but she had several days was less than 4 hours of nightly use. She still needs Ambien , now 18 month after the death of her spouse, this may helped with headache.  09/02/15 CM Connie Owens, 72 year old female returns for follow-up. She is here because her migraines have worsened. She has been taking Excedrin Migraine on a frequent basis. She says that heat  is a trigger for her headaches and we are having 90 days . Her preventive  is nortriptyline 40 mg at night. In addition she has obstructive apnea and uses CPAP approximately 4 hours at night. Her headache today on a pain scale of 1-10 is a 3-4. She is not aware  of foods that are specific  Triggers. Headaches 2-3 times a week lasting several hours .She returns for reevaluation.  Interval history from 05/04/2016, The pleasure of seeing Connie Owens today for revisit or has been more sparingly using Excedrin Migraine and still has headaches. She does not report excessive daytime sleepiness given that she endorsed the Epworth score at 7 points, fatigue severity at 25 points, and geriatric depression score at only 1 out of 15 points. She is using CPAP for the treatment of obstructive sleep apnea at a very low pressure of 6 cm water with a residual AHI of 0.5 and a compliance of 75% 100% 4 days 75% 4 hours of daily use was 5 hours 58 minutes on average. The needs to be no adjustments made to her CPAP except  that I would appreciate if she can try to use it at least 4 hours each night. CPAP has helped her headaches. She has signed up with a local gym and is trying to lose weight by diet and exercise.  Interval history from 05 April 2017.  I have the pleasure of meeting with Connie Owens today who was last seen in August 2018 by my nurse practitioner Vaughan Browner.  The patient reports that her CPAP compliance has dropped because the ear feels too hot and steamy to be comfortable.  We will address this today and change her humidifier settings, I also will see if she has a heated coil in her tubing system which may add additional heat and humidity.  Secondary she reports a bandlike headache that occurs whenever she lays down.  There is a tenderness around the crown of the head beginning at the high temporal level to the parietal into the occipital region.  This sensation causes her to turn over a lot of the restless.  She qualified this is a soreness and discomfort rather than a stabbing pain, there is no burning there is no sharp quality to the pain she does not feel this pain when she sits walks or is active in daytime only in bed. 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 [facility]. Her cardiac function has recovered- CAD with MI in 2014, EF 60% I-09-2017.  Dr. Percival Spanish will follow up in late March.   Family history- father was alcoholic and died of dementia, mother alive at 56, cognitively intact, gait disorder. Patient has  2 brothers and 3 sisters, healthy- 3 sisters have diabetes, 2 of them with type 1! Fuchs dystrophy. Mother and daughter with insomnia.    REVIEW OF SYSTEMS: Full 14 system review of systems performed and notable only for those listed, all others are neg:   Corneal transplant, OSA on CPAP, headaches. Obesity.  Epworth sleepiness score endorsed at 8 points which is tolerable, fatigue severity 24  points.   ALLERGIES: Allergies  Allergen Reactions  . Iohexol Rash    rash  . Statins   . Imdur [Isosorbide Nitrate]     Severe headaches  . Latex Rash  . Tape Rash and Other (See Comments)    Plastic tape  . Verapamil     REACTION: dizziness    HOME MEDICATIONS: Outpatient Medications Prior to Visit  Medication Sig Dispense Refill  . aspirin 81 MG tablet Take 81 mg by mouth daily.    . brimonidine-timolol (COMBIGAN) 0.2-0.5 % ophthalmic solution Apply to eye. 2 gtts L eye, 1 gtt R eye.    [name]  carvedilol (COREG) 6.25 MG tablet Take 1 tablet (6.25 mg total) by mouth 2 (two) times daily. 180 tablet 1  . clobetasol ointment (TEMOVATE) 2.70 % Apply 1 application topically 2 (two) times daily. 90 g 3  . clopidogrel (PLAVIX) 75 MG tablet Take 1 tablet (75 mg total) by mouth daily. 90 tablet 1  . estradiol (ESTRACE) 1 MG tablet Take 5 mg by mouth daily.     [name] gabapentin (NEURONTIN) 100 MG capsule 2 po tid for pain (Patient taking differently: Take 100 mg by mouth 3 (three) times daily. 2 po tid for pain) 540 capsule 0  . losartan (COZAAR) 50 MG tablet Take 1 tablet (50 mg total) by mouth daily. Needs appt. 90 tablet 1  . nitroGLYCERIN (NITROSTAT) 0.4 MG SL tablet Place 1 tablet (0.4 mg total) under the tongue every 5 (five) minutes x 3 doses as needed for chest pain. 50 tablet 1  . nortriptyline (PAMELOR) 10 MG capsule Take 5 capsules (50 mg total) by mouth at bedtime. 450 capsule 3  . prednisoLONE acetate (PRED FORTE) 1 % ophthalmic suspension Place 1 drop into both eyes 2 (two) times daily.    [name] zolpidem (AMBIEN) 10 MG tablet Take 1 tablet (10 mg total) by mouth at bedtime as needed for sleep. 30 tablet 0  . diphenoxylate-atropine (LOMOTIL) 2.5-0.025 MG per tablet 1 to 2 tablets in the morning, then every 6 hours as needed for diarrhea. 720 tablet 3   No facility-administered medications prior to visit.     PAST MEDICAL HISTORY: Past Medical History:  Diagnosis Date  . Anxiety state     NOS  . Arthritis    osteo...right knee  . Back pain   . CAD (coronary artery disease)    NSTEMI 8/13 => LHC 09/27/11: oLM 50%, dLM 30%, oLAD 60-70%, pLAD 50%, mLAD 70%, pD1 30%, pD2 40%, oRCA 25%, mRCA 40%, EF 50% with ant HK => LAD not amenable to PCI => med Rx  unless recurrent angina = > consider CABG  . Carpal tunnel syndrome   . Corneal disorder   . Diverticular disease   . Dysmetabolic syndrome X   . GERD (gastroesophageal reflux disease)   . 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   [name] Headache, paroxysmal hemicrania, chronic 07/23/2012    Referral to BOTOX evaluation with dr Rexene Alberts or Krista Blue .  [name] Heart attack ([place]) 09/2011  . Hernia    hiatal  . HLD (hyperlipidemia)   . Hx of adenomatous colonic polyps   . Hypertension    essen. NOS  . Ischemic cardiomyopathy    a.  Echo 09/29/11: EF 30-35% with inferior, posterior, lateral AK, anterior severe HK, mild LVH, mild MR, PASP 40;  => b. follow up echo 10/13:  EF 55-60%, Gr 1 diast dysfn, mild MR  . Migraine headache without aura 07/23/2012    Left sided  Lamonte Sakai thought to be migrainous  , hypersensitivity to touch , photophobia ,  no nausea.   [name] Postmenopausal status   . Shoulder fracture   . Sleep apnea    CPAP    PAST SURGICAL HISTORY: Past Surgical History:  Procedure Laterality Date  . ABDOMINAL HYSTERECTOMY    . ankle cystectomy Right   . APPENDECTOMY    . BREAST EXCISIONAL BIOPSY Right 02/15/1994  . BREAST LUMPECTOMY Right 1996  . BUNIONECTOMY Right   . CARDIAC CATHETERIZATION  09/28/2011   Dr Acie Fredrickson(  to see Dr Ron Parker)  . CHOLECYSTECTOMY    . COLONOSCOPY W/ POLYPECTOMY    . 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 CORNEAL TRANSPLANT Left    x1  . HEMORRHOID SURGERY  12/01/2010, 2014   [place] hemorrhoid ligation/pexy  . LEFT HEART CATHETERIZATION WITH CORONARY ANGIOGRAM N/A 09/28/2011   Procedure: LEFT HEART  CATHETERIZATION WITH CORONARY ANGIOGRAM;  Surgeon: Minus Breeding, MD;  Location[facility] C[facility] LAB;  Service: Cardiovascular;  Laterality: N/A;  . lumbar spur removed    . rectocele surgery    . 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 [facility].   Her cardiac function has recovered- CAD with MI in 2014, EF 60% I-09-2017.  Dr Percival Spanish will follow up in late March.   Skin reduction surgery after massive weight loss, tummy tuck 11-02-2016.   FAMILY HISTORY: Family History  Problem Relation Age of Onset  . Breast cancer Mother   . Heart disease Father   . Stroke Father   . Dementia Father   . Prostate cancer Father   . Diabetes Sister        3 sisters   . Heart disease Sister        1 sister CABG  . Breast cancer Sister        PTE pre & post Dx of ca  . Diabetes Paternal Grandfather   . Breast cancer Sister   . Colon cancer Neg Hx     SOCIAL HISTORY: Social History   Socioeconomic History  . Marital status: Widowed    Spouse name: Ruthann Cancer  . Number of children: 3  . Years of education: 68  . Highest education level: Not on file  Social Needs  . Financial resource strain: Not on file  . Food insecurity - worry: Not on file  . Food insecurity - inability: Not on file  . Transportation needs - medical: Not on file  . Transportation needs - non-medical: Not on file  Occupational History  . 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 Smoking status: Never Smoker  . Smokeless tobacco: Never Used  Substance and Sexual Activity  . Alcohol use: Yes    Comment: rare  . Drug use: No  . Sexual activity: Not on file  Other Topics Concern  . Not on file  Social History Narrative   Patient is widowed,([place] past 10/02/13) and lives at home with her husband.   Patient has three adult children.   Patient has a college education.    Patient is right-handed.   Patient drinks two cups of coffee daily.   Regular exercise   Patient is retired.     PHYSICAL EXAM  Vitals:   04/05/17 1312  BP: 120/71  Pulse: 74  Weight: 195 lb (88.5 kg)  Height: 5\' 5"  (1.651 m)   Body mass index is 32.45 kg/m.   Mallampati 3, neck 15 inches.   Generalized: Well developed, in no acute distress , obese caucasian female. No temporal artery induration. No occipital tenderness. Well groomed.   Neurological examination  Mentation: Alert oriented to time, place, history taking. Follows all commands, her speech and language are fluent Cranial nerve : Pupils unequal, disrounded- but reactive to light. Wears contacts.  Left pupil is slit-like, 5-6 mm,  right 4 mm and tear shaped.  No nystagmus, no diplopia. Extraocular movements were full, visual field were full on confrontational test. Facial sensation and strength were normal. Pink  tongue in midline. Head turning intact, normal and symmetric.  Motor:  5 /5 strength of all 4 extremities, symmetric motor tone is noted throughout. No cog-wheeling, no waxy resistance , no spasticity, no atrophy.  Right knee replacement (helped pain) now almost normal  ROM, soft tissue pain.   Sensory:  intact to soft touch, pin prick  on all 4 extremities.  Coordination:  finger-nose-intact, no tremor, no dysmetria. Good handwriting, no problems with buttons, hook closures, etc.  Gait and station: Gait narrow based- turns with 3 steps. with normal erect posture.   Reflexes: Deep tendon reflexes are symmetric bilaterally.  , DIAGNOSTIC DATA (LABS, IMAGING, TESTING) - I reviewed patient records, labs, notes, testing and imaging myself where available.  Reviewed Echo cardiogram, note from Dr. Percival Spanish,   [facility] records. Labs. Surgical notes.  She has had so many corneal transplants- viral ? Autoimmune?   CPAP is an [employer] shows 27 out of 30 days of use, only 16 of those days over 4  hours consecutively.  Average use of time is 4 hours 23 minutes, CPAP is set at only 6 cmH2O with EPR and her residual AHI is 1.0.  She does not have significant air leaks.  As discussed below we will resected humidifier to a lower level as she feels she has steaming hot air coming out.  Epworth sleepiness score endorsed at 8 points which is tolerable, fatigue severity 24 points.   ASSESSMENT AND PLAN  72 y.o. year old female  has a past medical history of CAD (coronary artery disease); Ischemic cardiomyopathy; Corneal disorder; Dysmetabolic syndrome X; Arthritis; Carpal tunnel syndrome;  Diverticular disease;  Anxiety state; Hypertension;  HLD (hyperlipidemia); Migraine headache without aura (07/23/2012); Headache, paroxysmal hemicrania, chronic (07/23/2012); Heart attack ([place]) (09/2011); and Glaucoma (increased eye pressure) (07/30/2013). here To follow-up for her migraine headache which is increased. It is a 3 out of 4 on a scale of 1-10  1) tension headche , sensitive to touch, throbbing and sore- no nausea.  2) OSA on CPAP, needs better compliance- I reduced her humifier settings. download reviewed and dictated.  3) CAD- Echo lokks good 4) Status post weight loss - skin reduction. 5) repeat corneal rejection-  transplant  6) insomnia - sleep hygiene discussed, referral for  behavior therapy offered.    PLAN:  Ieave nortriptyline to 5 capsules at night.  Use CPAP every night, no adjustment needed. Has improved compliance to 75%.   Weight loss - the use of nortriptyline may interfere with her weight loss plans. It has helped her to sleep deeper it has helped her with the headache frequency. I hope that he could reduce it from 5 capsules at night to 4 or 3 and see if this is working to reduce her appetite. The headaches have improved under CPAP use and since she has weaned off Excedrin. She understood the mechanism of rebound headaches.   Keep Follow-up with Jinny Blossom, NP in [place] month  Larey Seat, MD   [facility] [address] [place], [place] [address] [phone]

## 2017-04-09 ENCOUNTER — Ambulatory Visit: Payer: Medicare Other | Admitting: Physical Therapy

## 2017-04-09 ENCOUNTER — Encounter: Payer: Self-pay | Admitting: Physical Therapy

## 2017-04-09 DIAGNOSIS — R279 Unspecified lack of coordination: Secondary | ICD-10-CM | POA: Diagnosis not present

## 2017-04-09 DIAGNOSIS — M62838 Other muscle spasm: Secondary | ICD-10-CM | POA: Diagnosis not present

## 2017-04-09 DIAGNOSIS — M6281 Muscle weakness (generalized): Secondary | ICD-10-CM

## 2017-04-09 NOTE — Therapy (Signed)
[facility] [address] [place], [place], [address] Phone: [phone]   Fax:  [phone]  Physical Therapy Treatment  Patient Details  Name: Connie Owens MRN: [id number] Date of Birth: 1945/10/27 Referring Provider: Dr. Bobbye Charleston   Encounter Date: 04/09/2017  PT End of Session - 04/09/17 1229    Visit Number  5    Date for PT Re-Evaluation  04/17/17    Authorization Type  medicare    PT Start Time  1145    PT Stop Time  1230    PT Time Calculation (min)  45 min    Activity Tolerance  Patient tolerated treatment well    Behavior During Therapy  [facility] for tasks assessed/performed       Past Medical History:  Diagnosis Date  . Anxiety state    NOS  . Arthritis    osteo...right knee  . Back pain   . CAD (coronary artery disease)    NSTEMI 8/13 => LHC 09/27/11: oLM 50%, dLM 30%, oLAD 60-70%, pLAD 50%, mLAD 70%, pD1 30%, pD2 40%, oRCA 25%, mRCA 40%, EF 50% with ant HK => LAD not amenable to PCI => med Rx  unless recurrent angina = > consider CABG  . Carpal tunnel syndrome   . Corneal disorder   . Diverticular disease   . Dysmetabolic syndrome X   . GERD (gastroesophageal reflux disease)   . 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   [name] Headache, paroxysmal hemicrania, chronic 07/23/2012    Referral to BOTOX evaluation with dr Rexene Alberts or Krista Blue .  [name] Heart attack ([place]) 09/2011  . Hernia    hiatal  . HLD (hyperlipidemia)   . Hx of adenomatous colonic polyps   . Hypertension    essen. NOS  . Ischemic cardiomyopathy    a.  Echo 09/29/11: EF 30-35% with inferior, posterior, lateral AK, anterior severe HK, mild LVH, mild MR, PASP 40;  => b. follow up echo 10/13:  EF 55-60%, Gr 1 diast dysfn, mild MR  . Migraine headache without aura 07/23/2012    Left sided  Lamonte Sakai thought to be migrainous  , hypersensitivity to touch , photophobia ,  no nausea.   [name]  Postmenopausal status   . Shoulder fracture   . Sleep apnea    CPAP    Past Surgical History:  Procedure Laterality Date  . ABDOMINAL HYSTERECTOMY    . ankle cystectomy Right   . APPENDECTOMY    . BREAST EXCISIONAL BIOPSY Right 02/15/1994  . BREAST LUMPECTOMY Right 1996  . BUNIONECTOMY Right   . CARDIAC CATHETERIZATION  09/28/2011   Dr Acie Fredrickson(  to see Dr Ron Parker)  . CHOLECYSTECTOMY    . COLONOSCOPY W/ POLYPECTOMY    . 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 CORNEAL TRANSPLANT Left    x1  . HEMORRHOID SURGERY  12/01/2010, 2014   [place] hemorrhoid ligation/pexy  . LEFT HEART CATHETERIZATION WITH CORONARY ANGIOGRAM N/A 09/28/2011   Procedure: LEFT HEART CATHETERIZATION WITH CORONARY ANGIOGRAM;  Surgeon: Minus Breeding, MD;  Location: [facility] CATH LAB;  Service: Cardiovascular;  Laterality: N/A;  . lumbar spur removed    . rectocele surgery    . TOTAL KNEE ARTHROPLASTY Right     There were no vitals filed for this visit.  Subjective Assessment - 04/09/17 1148    Subjective  When she sneezes,  cough will leak urine. Urinary leakage is better. When I get the urge I can make it to the bathroom without leaking.  The urge is not as frequent.  Patient went out shopping several times without a pad.     Patient Stated Goals  be able to get to the bathroom without dribbling, not have to wear a pad out of the house    Currently in Pain?  No/denies    Multiple Pain Sites  No                      [facility] Adult PT Treatment/Exercise - 04/09/17 0001      Lumbar Exercises: Aerobic   Nustep  6 min, level 2 arm and legs seat #7, arms #11      Lumbar Exercises: Standing   Other Standing Lumbar Exercises  sit to stand with pelvic floor contraction             PT Education - 04/09/17 1219    Education provided  Yes    Education Details  core strength; pelvic floor contraction with sneeze and cough    Person(s) Educated  Patient    Methods   Explanation;Demonstration;Handout;Verbal cues    Comprehension  Verbalized understanding;Returned demonstration       PT Short Term Goals - 03/27/17 1154      PT SHORT TERM GOAL #4   Title  urinary leakage with movement decreased >/= 25% due to improve quality of pelvic floor contraction    Time  4    Period  Weeks    Status  Achieved        PT Long Term Goals - 04/09/17 1220      PT LONG TERM GOAL #1   Title  independent with HEP and how to progress herself    Baseline  still learning    Time  8    Period  Weeks    Status  On-going      PT LONG TERM GOAL #2   Title  pelvic floor strength is 4/5 so her urinary leakage is </= 75%    Time  8    Period  Weeks    Status  On-going      PT LONG TERM GOAL #3   Title  reduce pad use to light day pad 1 time per day and not have to use a depends    Baseline  not using around house, wears pad when she goes out    Time  8    Period  Weeks    Status  On-going      PT LONG TERM GOAL #4   Title  able to get out of her chair and walk to the commode without urinary leakage due to improve coordination of the pelvic floor muscles    Time  8    Period  Weeks    Status  Achieved            Plan - 04/09/17 1150    Clinical Impression Statement  Patient is  not leaking urine when she gets the urge to go to the bathroom and walks to the commode.  Patient reports her urge has reduced.  Patient will leak uring with coughing and sneezing . Patient still needs further strength of her core.  Pwill benefit from skilled therapy to increase pelvic floor strength and coordination to reduce urinary leakage.     Rehab Potential  Excellent    Clinical  Impairments Affecting Rehab Potential  Abdominalplasty 02/14/2016; Pelvic Prolapse surgery  for rectocele 1990; hysterectomy 1983    PT Frequency  1x / week    PT Duration  8 weeks    PT Treatment/Interventions  Biofeedback;Neuromuscular re-education;Patient/family education;Therapeutic  exercise;Therapeutic activities;Scar mobilization;Manual techniques    PT Next Visit Plan  lower abdominal exercise;  pelvic floor contraction with sneeze and cough    PT Home Exercise Plan  progress as needed    Consulted and Agree with Plan of Care  Patient       Patient will benefit from skilled therapeutic intervention in order to improve the following deficits and impairments:  Increased fascial restricitons, Decreased scar mobility, Increased muscle spasms, Decreased strength, Decreased activity tolerance, Decreased endurance  Visit Diagnosis: Muscle weakness (generalized)  Unspecified lack of coordination  Other muscle spasm     Problem List Patient Active Problem List   Diagnosis Date Noted  . Cluster headache, not intractable 05/04/2016  . History of colonic polyps June 28, 202016  . CAD (coronary artery disease) 07/23/2014  . Glaucoma (increased eye pressure) 07/30/2013  . OSA on CPAP 07/30/2013  . Obesity, unspecified 07/30/2013  . Obesity (BMI 30-39.9) 03/18/2013  . Abdominal pain, other specified site 03/18/2013  . ERRONEOUS ENCOUNTER--DISREGARD 03/06/2013  . Internal hemorrhoids with other complication 90/38/3338  . Nonspecific abnormal finding in stool contents 11/05/2012  . Abdominal pain, left upper quadrant 11/05/2012  . Diffuse pain 07/24/2012  . Migraine headache without aura 07/23/2012  . Headache, paroxysmal hemicrania, chronic 07/23/2012  . OSA (obstructive sleep apnea) 06/11/2012  . NSTEMI (non-ST elevated myocardial infarction) ([place]) 09/28/2011  . IBS (irritable bowel syndrome) 03/22/2011  . Hyperlipidemia 12/21/2010  . DIARRHEA, CHRONIC 01/13/2010  . COCCYGEAL PAIN 05/11/2009  . Anorectal pain 02/02/2009  . HIP PAIN, LEFT 02/02/2009  . CORNEAL DISORDER 12/11/2008  . POSTMENOPAUSAL STATUS 12/11/2008  . ADVERSE DRUG REACTION 12/11/2008  . DYSPHAGIA UNSPECIFIED 10/02/2008  . BACK PAIN 07/15/2008  . Dysmetabolic syndrome X 32/91/9166  .  OSTEOARTHRITIS, KNEE, RIGHT 06/19/2007  . CARPAL TUNNEL SYNDROME 04/25/2007  . ADENOMATOUS COLONIC POLYP 04/23/2007  . GERD 04/23/2007  . DIVERTICULAR DISEASE 04/23/2007  . HIATAL HERNIA 11/16/2006  . ANXIETY STATE NOS 11/12/2006  . Essential hypertension 10/30/2006    Earlie Counts, PT 04/09/17 12:30 PM   [facility] [address] [place], [place], [address] Phone: [phone]   Fax:  [phone]  Name: Connie Owens MRN: [id number] Date of Birth: October 19, 1945

## 2017-04-09 NOTE — Patient Instructions (Addendum)
Isometric Hold (Hook-Lying)    Lie with hips and knees bent. Slowly inhale, and then exhale. Pull navel toward spine, while contracting the pelvic floor and Hold for _5__ seconds. Continue to breathe in and out during hold. Rest for _5__ seconds. Repeat 10___ times. Do __1_ times a day.   Copyright  VHI. All rights reserved.    Bracing With Knee Fallout (Hook-Lying)    With neutral spine, tighten pelvic floor and abdominals and hold. Alternating legs, drop knee out to side. Keep opposite hip still. Repeat _10__ times. Do _1__ times a day.   Copyright  VHI. All rights reserved.  Bracing With Leg March (Hook-Lying)    With neutral spine, tighten pelvic floor and abdominals and hold. Press right hand into left knee hold 5sec return to floor. Then press left hand into right knee hold 5 sec Repeat _10__ times. Do __1_ times a day.   Copyright  VHI. All rights reserved.    Start in the plank position (seen previously). Lift one arm and the opposing leg, keeping your core position steady. As you switch to the opposite arm and leg, keep your core steady and do not arch the back as you switch. Keep your body weight forward so that your shoulders are over your wrists, not behind them. 10 times 1 time per day.     Cough: Phase 2 (Sitting)    Sit. Squeeze pelvic floor and hold. Inhale. Lean shoulders forward. Exhale. Relax. Repeat _10_ times. Do _1__ times a day.  Copyright  VHI. All rights reserved.   When you sneeze or coughs pull up pelvic floor .  Sit to Stand: Phase 3    Sitting, squeeze pelvic floor and hold. Lean trunk forward. Push up on arms and stand up. Relax. Do everytime you get in and out  of a chair Copyright  VHI. All rights reserved.    [facility] [address] [place], [place] [address] Phone # [phone] Fax [phone]

## 2017-04-10 ENCOUNTER — Encounter: Payer: Self-pay | Admitting: Family Medicine

## 2017-04-10 ENCOUNTER — Ambulatory Visit (INDEPENDENT_AMBULATORY_CARE_PROVIDER_SITE_OTHER): Payer: Medicare Other | Admitting: Family Medicine

## 2017-04-10 VITALS — BP 103/72 | HR 62 | Temp 97.9°F | Resp 16 | Ht 65.0 in | Wt 194.2 lb

## 2017-04-10 DIAGNOSIS — M792 Neuralgia and neuritis, unspecified: Secondary | ICD-10-CM

## 2017-04-10 DIAGNOSIS — L409 Psoriasis, unspecified: Secondary | ICD-10-CM

## 2017-04-10 DIAGNOSIS — T86840 Corneal transplant rejection: Secondary | ICD-10-CM | POA: Diagnosis not present

## 2017-04-10 DIAGNOSIS — Z114 Encounter for screening for human immunodeficiency virus [HIV]: Secondary | ICD-10-CM

## 2017-04-10 DIAGNOSIS — T868409 Corneal transplant rejection, unspecified eye: Secondary | ICD-10-CM

## 2017-04-10 DIAGNOSIS — Z0000 Encounter for general adult medical examination without abnormal findings: Secondary | ICD-10-CM | POA: Diagnosis not present

## 2017-04-10 DIAGNOSIS — R5383 Other fatigue: Secondary | ICD-10-CM

## 2017-04-10 DIAGNOSIS — E785 Hyperlipidemia, unspecified: Secondary | ICD-10-CM | POA: Diagnosis not present

## 2017-04-10 LAB — CBC WITH DIFFERENTIAL/PLATELET
BASOS ABS: 0 10*3/uL (ref 0.0–0.1)
Basophils Relative: 0.4 % (ref 0.0–3.0)
EOS ABS: 0.1 10*3/uL (ref 0.0–0.7)
Eosinophils Relative: 1 % (ref 0.0–5.0)
HEMATOCRIT: 40.3 % (ref 36.0–46.0)
Hemoglobin: 13.4 g/dL (ref 12.0–15.0)
LYMPHS PCT: 25 % (ref 12.0–46.0)
Lymphs Abs: 2.3 10*3/uL (ref 0.7–4.0)
MCHC: 33.2 g/dL (ref 30.0–36.0)
MCV: 88 fl (ref 78.0–100.0)
MONO ABS: 0.7 10*3/uL (ref 0.1–1.0)
Monocytes Relative: 7.9 % (ref 3.0–12.0)
NEUTROS PCT: 65.7 % (ref 43.0–77.0)
Neutro Abs: 5.9 10*3/uL (ref 1.4–7.7)
Platelets: 380 10*3/uL (ref 150.0–400.0)
RBC: 4.58 Mil/uL (ref 3.87–5.11)
RDW: 14.8 % (ref 11.5–15.5)
WBC: 9.1 10*3/uL (ref 4.0–10.5)

## 2017-04-10 LAB — COMPREHENSIVE METABOLIC PANEL
ALT: 15 U/L (ref 0–35)
AST: 17 U/L (ref 0–37)
Albumin: 3.8 g/dL (ref 3.5–5.2)
Alkaline Phosphatase: 63 U/L (ref 39–117)
BILIRUBIN TOTAL: 0.4 mg/dL (ref 0.2–1.2)
BUN: 13 mg/dL (ref 6–23)
CALCIUM: 10.1 mg/dL (ref 8.4–10.5)
CHLORIDE: 101 meq/L (ref 96–112)
CO2: 28 meq/L (ref 19–32)
CREATININE: 0.77 mg/dL (ref 0.40–1.20)
GFR: 78.33 mL/min (ref >60.00)
GLUCOSE: 115 mg/dL — AB (ref 70–99)
Potassium: 5.1 mEq/L (ref 3.5–5.1)
SODIUM: 136 meq/L (ref 135–145)
Total Protein: 7.3 g/dL (ref 6.0–8.3)

## 2017-04-10 LAB — LIPID PANEL
CHOL/HDL RATIO: 4
Cholesterol: 188 mg/dL (ref 0–200)
HDL: 53.3 mg/dL (ref >39.00)
LDL CALC: 110 mg/dL — AB (ref 0–99)
NONHDL: 135.04
Triglycerides: 127 mg/dL (ref 0.0–149.0)
VLDL: 25.4 mg/dL (ref 0.0–40.0)

## 2017-04-10 LAB — SEDIMENTATION RATE: SED RATE: 22 mm/h (ref 0–30)

## 2017-04-10 MED ORDER — CLOBETASOL PROPIONATE 0.05 % EX OINT: 1.0000 "application " | TOPICAL_OINTMENT | Freq: Two times a day (BID) | CUTANEOUS | 1 refills | Status: DC

## 2017-04-10 MED ORDER — GABAPENTIN 100 MG PO CAPS: ORAL_CAPSULE | ORAL | 1 refills | Status: DC

## 2017-04-10 NOTE — Progress Notes (Signed)
Subjective:     Connie Owens is a 72 y.o. female and is here for a comprehensive physical exam. 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    Social History   Socioeconomic History  . Marital status: Widowed    Spouse name: Ruthann Cancer  . Number of children: 3  . Years of education: 28  . Highest education level: Not on file  Social Needs  . Financial resource strain: Not on file  . Food insecurity - worry: Not on file  . Food insecurity - inability: Not on file  . Transportation needs - medical: Not on file  . Transportation needs - non-medical: Not on file  Occupational History  . 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 Smoking status: Never Smoker  . Smokeless tobacco: Never Used  Substance and Sexual Activity  . Alcohol use: Yes    Comment: rare  . Drug use: No  . Sexual activity: Not on file  Other Topics Concern  . Not on file  Social History Narrative   Patient is widowed,([place] past 10/02/13) and lives at home with her husband.   Patient has three adult children.   Patient has a college education.   Patient is right-handed.   Patient drinks two cups of coffee daily.   Regular exercise   Patient is retired.   Health Maintenance  Topic Date Due  . TETANUS/TDAP  04/21/1964  . Hepatitis C Screening  04/11/2023 (Originally 06/25/1945)  . COLONOSCOPY  12/09/2017  . MAMMOGRAM  06/20/2018  . INFLUENZA VACCINE  Completed  . DEXA SCAN  Completed  . PNA vac Low Risk Adult  Completed    The following portions of the patient's history were reviewed and updated as appropriate:  She  has a past medical history of Anxiety state, Arthritis, Back pain, CAD (coronary artery disease), Carpal tunnel syndrome, Corneal disorder, Diverticular disease, Dysmetabolic syndrome X, GERD (gastroesophageal reflux  disease), Glaucoma (increased eye pressure) (07/30/2013), Headache, paroxysmal hemicrania, chronic (07/23/2012), Heart attack ([place]) (09/2011), Hernia, HLD (hyperlipidemia), adenomatous colonic polyps, Hypertension, Ischemic cardiomyopathy, Migraine headache without aura (07/23/2012), Postmenopausal status, Shoulder fracture, and Sleep apnea. She does not have any pertinent problems on file. She  has a past surgical history that includes Appendectomy; Cholecystectomy; Abdominal hysterectomy; Breast lumpectomy (Right, 1996); Colonoscopy w/ polypectomy; lumbar spur removed; Total knee arthroplasty (Right); Bunionectomy (Right); ankle cystectomy (Right); rectocele surgery; Corneal transplant (Right, may 2012); Hemorrhoid surgery (12/01/2010, 2014); Cardiac catheterization (09/28/2011); Corneal transplant (Right, 08/30/2011); Corneal transplant (Left); left heart catheterization with coronary angiogram (N/A, 09/28/2011); Breast excisional biopsy (Right, 02/15/1994); Abdominoplasty (10/23/2016); and Brachioplasty (Bilateral, 05/02/2016). Her family history includes Breast cancer in her mother, sister, and sister; Dementia in her father; Diabetes in her paternal grandfather and sister; Heart disease in her father and sister; Prostate cancer in her father; Stroke in her father. She  reports that  has never smoked. she has never used smokeless tobacco. She reports that she drinks alcohol. She reports that she does not use drugs. She has a current medication list which includes the following prescription(s): aspirin, brimonidine-timolol, carvedilol, clobetasol ointment, clopidogrel, estradiol, gabapentin, losartan, nitroglycerin, nortriptyline, prednisolone acetate, and zolpidem. Current Outpatient Medications on File Prior to Visit  Medication Sig Dispense Refill  . aspirin 81 MG tablet Take 81 mg by mouth daily.    [name]  brimonidine-timolol (COMBIGAN) 0.2-0.5 % ophthalmic solution Apply to eye. 2 gtts L eye, 1 gtt R eye.     . carvedilol (COREG) 6.25 MG tablet Take 1 tablet (6.25 mg total) by mouth 2 (two) times daily. 180 tablet 1  . clopidogrel (PLAVIX) 75 MG tablet Take 1 tablet (75 mg total) by mouth daily. 90 tablet 1  . estradiol (ESTRACE) 1 MG tablet Take 5 mg by mouth daily.     [name] losartan (COZAAR) 50 MG tablet Take 1 tablet (50 mg total) by mouth daily. Needs appt. 90 tablet 1  . nitroGLYCERIN (NITROSTAT) 0.4 MG SL tablet Place 1 tablet (0.4 mg total) under the tongue every 5 (five) minutes x 3 doses as needed for chest pain. 50 tablet 1  . nortriptyline (PAMELOR) 10 MG capsule Take 5 capsules (50 mg total) by mouth at bedtime. 450 capsule 3  . prednisoLONE acetate (PRED FORTE) 1 % ophthalmic suspension Place 1 drop into both eyes 2 (two) times daily.    [name] zolpidem (AMBIEN CR) 12.5 MG CR tablet Take 1 tablet (12.5 mg total) by mouth at bedtime as needed for sleep. 90 tablet 1   No current facility-administered medications on file prior to visit.    She is allergic to iohexol; statins; imdur [isosorbide nitrate]; latex; tape; and verapamil..  Review of Systems Review of Systems  Constitutional: Negative for activity change, appetite change and fatigue.  HENT: Negative for hearing loss, congestion, tinnitus and ear discharge.  dentist q60m Eyes: Negative for visual disturbance (see optho q1y -- vision corrected to 20/20 with glasses).  Respiratory: Negative for cough, chest tightness and shortness of breath.   Cardiovascular: Negative for chest pain, palpitations and leg swelling.  Gastrointestinal: Negative for abdominal pain, diarrhea, constipation and abdominal distention.  Genitourinary: Negative for urgency, frequency, decreased urine volume and difficulty urinating.  Musculoskeletal: Negative for back pain, arthralgias and gait problem.  Skin: Negative for color change, pallor and rash.  Neurological: Negative for dizziness, light-headedness, numbness and headaches.  Hematological: Negative for  adenopathy. Does not bruise/bleed easily.  Psychiatric/Behavioral: Negative for suicidal ideas, confusion, sleep disturbance, self-injury, dysphoric mood, decreased concentration and agitation.       Objective:    BP 103/72 (BP Location: Left Arm, Cuff Size: Normal)   Pulse 62   Temp 97.9 F (36.6 C) (Oral)   Resp 16   Ht 5\' 5"  (1.651 m)   Wt 194 lb 3.2 oz (88.1 kg)   SpO2 97%   BMI 32.32 kg/m  General appearance: alert, cooperative, appears stated age and no distress Head: Normocephalic, without obvious abnormality, atraumatic Eyes: negative findings: lids and lashes normal and pupils equal, round, reactive to light and accomodation Ears: normal TM's and external ear canals both ears Nose: Nares normal. Septum midline. Mucosa normal. No drainage or sinus tenderness. Throat: lips, mucosa, and tongue normal; teeth and gums normal Neck: no adenopathy, no carotid bruit, no JVD, supple, symmetrical, trachea midline and thyroid not enlarged, symmetric, no tenderness/mass/nodules Back: symmetric, no curvature. ROM normal. No CVA tenderness. Lungs: clear to auscultation bilaterally Breasts: gyn Heart: regular rate and rhythm, S1, S2 normal, no murmur, click, rub or gallop Abdomen: soft, non-tender; bowel sounds normal; no masses,  no organomegaly Pelvic: deferred --gyn Extremities: extremities normal, atraumatic, no cyanosis or edema Pulses: 2+ and symmetric Skin: Skin color, texture, turgor normal. No rashes or lesions Lymph nodes: Cervical, supraclavicular, and axillary nodes normal. Neurologic: Alert and oriented X 3, normal strength and tone. Normal symmetric reflexes.  Normal coordination and gait    Assessment:    Healthy female exam.      Plan:    ghm utd Check labs  See After Visit Summary for Counseling Recommendations    1. Neuropathic pain stable - Lipid panel - gabapentin (NEURONTIN) 100 MG capsule; 2 po tid for pain  Dispense: 540 capsule; Refill: 1  2.  Psoriasis stable - clobetasol ointment (TEMOVATE) 0.05 %; Apply 1 application topically 2 (two) times daily.  Dispense: 90 g; Refill: 1  3. Hyperlipidemia LDL goal <100 Tolerating statin, encouraged heart healthy diet, avoid trans fats, minimize simple carbs and saturated fats. Increase exercise as tolerated - Comprehensive metabolic panel - Lipid panel  4. Fatigue, unspecified type Check labs - CBC with Differential/Platelet - Comprehensive metabolic panel - Lipid panel  5. Preventative health care See above ghm utd See avs   6. Corneal transplant rejection Per opth - Sedimentation rate - Antinuclear Antib (ANA) - Rheumatoid Factor - HIV antibody  7. Encounter for screening for HIV  - HIV antibody

## 2017-04-10 NOTE — Patient Instructions (Signed)

## 2017-04-11 LAB — RHEUMATOID FACTOR

## 2017-04-11 LAB — HIV ANTIBODY (ROUTINE TESTING W REFLEX): HIV: NONREACTIVE

## 2017-04-11 LAB — ANA: ANA: NEGATIVE

## 2017-04-17 ENCOUNTER — Ambulatory Visit: Payer: Medicare Other | Attending: Obstetrics and Gynecology | Admitting: Physical Therapy

## 2017-04-17 ENCOUNTER — Encounter: Payer: Self-pay | Admitting: Physical Therapy

## 2017-04-17 DIAGNOSIS — M6281 Muscle weakness (generalized): Secondary | ICD-10-CM | POA: Diagnosis not present

## 2017-04-17 DIAGNOSIS — M62838 Other muscle spasm: Secondary | ICD-10-CM

## 2017-04-17 DIAGNOSIS — R279 Unspecified lack of coordination: Secondary | ICD-10-CM | POA: Diagnosis not present

## 2017-04-17 NOTE — Patient Instructions (Signed)
Relaxation Exercises with the Urge to Void   When you experience an urge to void:  FIRST  Stop and stand very still    Sit down if you can    Don't move    You need to stay very still to maintain control  SECOND Squeeze your pelvic floor muscles 5 times, like a quick flick, to keep from leaking  THIRD Relax  Take a deep breath and then let it out  Try to make the urge go away by using relaxation and visualization techniques  FINALLY When you feel the urge go away somewhat, walk normally to the bathroom.   If the urge gets suddenly stronger on the way, you may stop again and relax to regain control.  [facility] [address], [address] [place], [place] [address] Phone # [phone] Fax [phone]  

## 2017-04-17 NOTE — Therapy (Signed)
[facility] [address] [place], [place], [address] Phone: [phone]   Fax:  [phone]  Physical Therapy Treatment  Patient Details  Name: ZORIA RAWLINSON MRN: [id number] Date of Birth: May 05, 1945 Referring Provider: Dr. Bobbye Charleston   Encounter Date: 04/17/2017  PT End of Session - 04/17/17 1151    Visit Number  6    Date for PT Re-Evaluation  06/12/17    Authorization Type  medicare    PT Start Time  1145    PT Stop Time  1225    PT Time Calculation (min)  40 min    Activity Tolerance  Patient tolerated treatment well    Behavior During Therapy  [facility] for tasks assessed/performed       Past Medical History:  Diagnosis Date  . Anxiety state    NOS  . Arthritis    osteo...right knee  . Back pain   . CAD (coronary artery disease)    NSTEMI 8/13 => LHC 09/27/11: oLM 50%, dLM 30%, oLAD 60-70%, pLAD 50%, mLAD 70%, pD1 30%, pD2 40%, oRCA 25%, mRCA 40%, EF 50% with ant HK => LAD not amenable to PCI => med Rx  unless recurrent angina = > consider CABG  . Carpal tunnel syndrome   . Corneal disorder   . Diverticular disease   . Dysmetabolic syndrome X   . GERD (gastroesophageal reflux disease)   . 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   [name] Headache, paroxysmal hemicrania, chronic 07/23/2012    Referral to BOTOX evaluation with dr Rexene Alberts or Krista Blue .  [name] Heart attack ([place]) 09/2011  . Hernia    hiatal  . HLD (hyperlipidemia)   . Hx of adenomatous colonic polyps   . Hypertension    essen. NOS  . Ischemic cardiomyopathy    a.  Echo 09/29/11: EF 30-35% with inferior, posterior, lateral AK, anterior severe HK, mild LVH, mild MR, PASP 40;  => b. follow up echo 10/13:  EF 55-60%, Gr 1 diast dysfn, mild MR  . Migraine headache without aura 07/23/2012    Left sided  Lamonte Sakai thought to be migrainous  , hypersensitivity to touch , photophobia ,  no nausea.   [name]  Postmenopausal status   . Shoulder fracture   . Sleep apnea    CPAP    Past Surgical History:  Procedure Laterality Date  . ABDOMINAL HYSTERECTOMY    . ABDOMINOPLASTY  10/23/2016  . ankle cystectomy Right   . APPENDECTOMY    . BRACHIOPLASTY Bilateral 05/02/2016  . BREAST EXCISIONAL BIOPSY Right 02/15/1994  . BREAST LUMPECTOMY Right 1996  . BUNIONECTOMY Right   . CARDIAC CATHETERIZATION  09/28/2011   Dr Acie Fredrickson(  to see Dr Ron Parker)  . CHOLECYSTECTOMY    . COLONOSCOPY W/ POLYPECTOMY    . 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 CORNEAL TRANSPLANT Left    x1  . HEMORRHOID SURGERY  12/01/2010, 2014   [place] hemorrhoid ligation/pexy  . LEFT HEART CATHETERIZATION WITH CORONARY ANGIOGRAM N/A 09/28/2011   Procedure: LEFT HEART CATHETERIZATION WITH CORONARY ANGIOGRAM;  Surgeon: Minus Breeding, MD;  Location: [facility] CATH LAB;  Service: Cardiovascular;  Laterality: N/A;  . lumbar spur removed    . rectocele surgery    . TOTAL KNEE ARTHROPLASTY Right     There were no vitals filed for this visit.  Subjective Assessment -  04/17/17 1152    Subjective  I started my exercise program.  I do not leak when I have to go to the bathroom.  When I have the urge I have to hurry to the bathroom.     Patient Stated Goals  be able to get to the bathroom without dribbling, not have to wear a pad out of the house    Multiple Pain Sites  No         [facility] PT Assessment - 04/17/17 0001      Assessment   Medical Diagnosis  N39.3 Stress Incontinence    Referring Provider  Dr. Bobbye Charleston    Onset Date/Surgical Date  10/21/16    Prior Therapy  None      Precautions   Precautions  None      Restrictions   Weight Bearing Restrictions  No      [place] residence      Prior Function   Level of Independence  Independent    Leisure  pool aerobic, treadmill, arm weightd      Cognition   Overall Cognitive Status  Within  Functional Limits for tasks assessed      Observation/Other Assessments   Skin Integrity  abdominalplasty scar is healed and tight     Focus on Therapeutic Outcomes (FOTO)   limited by 20% with urinary leakage and not wanting to go out of the house as much      Strength   Overall Strength Comments  abdominal strength 2+/5    Right Hip Extension  4/5    Right Hip ABduction  5/5    Left Hip Extension  4/5    Left Hip ABduction  4-/5      Palpation   Palpation comment  No tenderness located in the abdomen; good mobility of the abdominalplasty      Transfers   Transfers  Not assessed      Ambulation/Gait   Ambulation/Gait  No               Pelvic Floor Special Questions - 04/17/17 0001    Urinary Leakage  Yes    Activities that cause leaking  -- when waits too long to go to the bathroom    Pelvic Floor Internal Exam  Patient confirms identification and approves PT to assess muscle integrity and treat    Exam Type  Vaginal    Strength  fair squeeze, definite lift    Strength # of seconds  3 needs alot of verbal cues and tactile cues to perform        [facility] Adult PT Treatment/Exercise - 04/17/17 0001      Neuro Re-ed    Neuro Re-ed Details   urge to void with many verbal cues to perform correctly and not contract her gluteals      Lumbar Exercises: Aerobic   Nustep  6 min, level 2 arm and legs seat #7, arms #11      Lumbar Exercises: Standing   Other Standing Lumbar Exercises  lean on mat and alternate shoulder flexion and hip extension with VC to not twist trunk             PT Education - 04/17/17 1228    Education provided  Yes    Education Details  urge to void    Person(s) Educated  Patient    Methods  Explanation;Demonstration;Verbal cues;Handout    Comprehension  Returned demonstration;Verbalized understanding  PT Short Term Goals - 03/27/17 1154      PT SHORT TERM GOAL #4   Title  urinary leakage with movement decreased >/= 25% due to  improve quality of pelvic floor contraction    Time  4    Period  Weeks    Status  Achieved        PT Long Term Goals - 04/17/17 1154      PT LONG TERM GOAL #1   Title  independent with HEP and how to progress herself    Baseline  still learning    Time  8    Period  Weeks    Status  On-going      PT LONG TERM GOAL #2   Title  pelvic floor strength is 4/5 so her urinary leakage is </= 75%    Baseline  leakage is 45% better    Time  8    Period  Weeks    Status  On-going      PT LONG TERM GOAL #3   Title  reduce pad use to light day pad 1 time per day and not have to use a depends    Time  8    Period  Weeks    Status  Achieved      PT LONG TERM GOAL #4   Title  able to get out of her chair and walk to the commode without urinary leakage due to improve coordination of the pelvic floor muscles    Time  8    Period  Weeks    Status  Achieved            Plan - 04/17/17 1234    Clinical Impression Statement  Patient reports she will leak urine when she waits too long to go to the bathroom after the urge. Patient has diffculty with coordinating her pelvic floor contraction and holding it while breathing and not contracting the gluteals. Patient understands how to contract the lower abdominals but will use her hands to engage the lower abdominals.  Patient has increased bilateral hip strength to 4/5.  Pelvic floor strength is 3/5 with needing tactile cues to fully contract.  Patient will benefit from skilled therapy to improve pelvic floor strength to reduce the urinary leakage when she waits to long and redcue the urgency increasing the number of time she has to urinate.     Rehab Potential  Excellent    Clinical Impairments Affecting Rehab Potential  Abdominalplasty 02/14/2016; Pelvic Prolapse surgery  for rectocele 1990; hysterectomy 1983    PT Frequency  1x / week    PT Duration  8 weeks    PT Treatment/Interventions  Biofeedback;Neuromuscular re-education;Patient/family  education;Therapeutic exercise;Therapeutic activities;Scar mobilization;Manual techniques    PT Next Visit Plan  lower abdominal exercise;  pelvic floor contraction with sneeze and cough; urge to void    PT Home Exercise Plan  progress as needed    Recommended Other Services  MD signed initial summary    Consulted and Agree with Plan of Care  Patient       Patient will benefit from skilled therapeutic intervention in order to improve the following deficits and impairments:  Increased fascial restricitons, Decreased scar mobility, Increased muscle spasms, Decreased strength, Decreased activity tolerance, Decreased endurance  Visit Diagnosis: Muscle weakness (generalized) - Plan: PT plan of care cert/re-cert  Unspecified lack of coordination - Plan: PT plan of care cert/re-cert  Other muscle spasm - Plan: PT plan of care cert/re-cert  Problem List Patient Active Problem List   Diagnosis Date Noted  . Cluster headache, not intractable 05/04/2016  . History of colonic polyps Dec 01, 202016  . CAD (coronary artery disease) 07/23/2014  . Glaucoma (increased eye pressure) 07/30/2013  . OSA on CPAP 07/30/2013  . Obesity, unspecified 07/30/2013  . Obesity (BMI 30-39.9) 03/18/2013  . Abdominal pain, other specified site 03/18/2013  . ERRONEOUS ENCOUNTER--DISREGARD 03/06/2013  . Internal hemorrhoids with other complication 50/56/9794  . Nonspecific abnormal finding in stool contents 11/05/2012  . Abdominal pain, left upper quadrant 11/05/2012  . Diffuse pain 07/24/2012  . Migraine headache without aura 07/23/2012  . Headache, paroxysmal hemicrania, chronic 07/23/2012  . OSA (obstructive sleep apnea) 06/11/2012  . NSTEMI (non-ST elevated myocardial infarction) ([place]) 09/28/2011  . IBS (irritable bowel syndrome) 03/22/2011  . Hyperlipidemia 12/21/2010  . DIARRHEA, CHRONIC 01/13/2010  . COCCYGEAL PAIN 05/11/2009  . Anorectal pain 02/02/2009  . HIP PAIN, LEFT 02/02/2009  . CORNEAL  DISORDER 12/11/2008  . POSTMENOPAUSAL STATUS 12/11/2008  . ADVERSE DRUG REACTION 12/11/2008  . DYSPHAGIA UNSPECIFIED 10/02/2008  . BACK PAIN 07/15/2008  . Dysmetabolic syndrome X 80/16/5537  . OSTEOARTHRITIS, KNEE, RIGHT 06/19/2007  . CARPAL TUNNEL SYNDROME 04/25/2007  . ADENOMATOUS COLONIC POLYP 04/23/2007  . GERD 04/23/2007  . DIVERTICULAR DISEASE 04/23/2007  . HIATAL HERNIA 11/16/2006  . ANXIETY STATE NOS 11/12/2006  . Essential hypertension 10/30/2006    Earlie Counts, PT 04/17/17 12:41 PM   [facility] [address], [place], [place], [address] Phone: [phone]   Fax:  [phone]  Name: MESSIAH ROVIRA MRN: [id number] Date of Birth: 1945-06-17

## 2017-04-23 ENCOUNTER — Telehealth: Payer: Self-pay | Admitting: Cardiology

## 2017-04-23 NOTE — Telephone Encounter (Signed)
Closed Encounter  °

## 2017-04-25 ENCOUNTER — Encounter: Payer: Self-pay | Admitting: Physical Therapy

## 2017-04-25 ENCOUNTER — Ambulatory Visit: Payer: Medicare Other | Admitting: Physical Therapy

## 2017-04-25 DIAGNOSIS — R279 Unspecified lack of coordination: Secondary | ICD-10-CM

## 2017-04-25 DIAGNOSIS — M62838 Other muscle spasm: Secondary | ICD-10-CM

## 2017-04-25 DIAGNOSIS — M6281 Muscle weakness (generalized): Secondary | ICD-10-CM

## 2017-04-25 NOTE — Therapy (Signed)
[facility] [address] [place], [place], [address] Phone: [phone]   Fax:  [phone]  Physical Therapy Treatment  Patient Details  Name: Connie Owens MRN: [id number] Date of Birth: [birth year] Referring Provider: Dr. Bobbye Charleston   Encounter Date: 04/25/2017  PT End of Session - 04/25/17 1308    Visit Number  7    Date for PT Re-Evaluation  06/12/17    Authorization Type  medicare    PT Start Time  1100    PT Stop Time  1140    PT Time Calculation (min)  40 min    Activity Tolerance  Patient tolerated treatment well    Behavior During Therapy  [facility] for tasks assessed/performed       Past Medical History:  Diagnosis Date  . Anxiety state    NOS  . Arthritis    osteo...right knee  . Back pain   . CAD (coronary artery disease)    NSTEMI 8/13 => LHC 09/27/11: oLM 50%, dLM 30%, oLAD 60-70%, pLAD 50%, mLAD 70%, pD1 30%, pD2 40%, oRCA 25%, mRCA 40%, EF 50% with ant HK => LAD not amenable to PCI => med Rx  unless recurrent angina = > consider CABG  . Carpal tunnel syndrome   . Corneal disorder   . Diverticular disease   . Dysmetabolic syndrome X   . GERD (gastroesophageal reflux disease)   . 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   [name] Headache, paroxysmal hemicrania, chronic 07/23/2012    Referral to BOTOX evaluation with dr Rexene Alberts or Krista Blue .  [name] Heart attack ([place]) 09/2011  . Hernia    hiatal  . HLD (hyperlipidemia)   . Hx of adenomatous colonic polyps   . Hypertension    essen. NOS  . Ischemic cardiomyopathy    a.  Echo 09/29/11: EF 30-35% with inferior, posterior, lateral AK, anterior severe HK, mild LVH, mild MR, PASP 40;  => b. follow up echo 10/13:  EF 55-60%, Gr 1 diast dysfn, mild MR  . Migraine headache without aura 07/23/2012    Left sided  Lamonte Sakai thought to be migrainous  , hypersensitivity to touch , photophobia ,  no nausea.   [name]  Postmenopausal status   . Shoulder fracture   . Sleep apnea    CPAP    Past Surgical History:  Procedure Laterality Date  . ABDOMINAL HYSTERECTOMY    . ABDOMINOPLASTY  10/23/2016  . ankle cystectomy Right   . APPENDECTOMY    . BRACHIOPLASTY Bilateral 05/02/2016  . BREAST EXCISIONAL BIOPSY Right 02/15/1994  . BREAST LUMPECTOMY Right 1996  . BUNIONECTOMY Right   . CARDIAC CATHETERIZATION  09/28/2011   Dr Acie Fredrickson(  to see Dr Ron Parker)  . CHOLECYSTECTOMY    . COLONOSCOPY W/ POLYPECTOMY    . 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 CORNEAL TRANSPLANT Left    x1  . HEMORRHOID SURGERY  12/01/2010, 2014   [place] hemorrhoid ligation/pexy  . LEFT HEART CATHETERIZATION WITH CORONARY ANGIOGRAM N/A 09/28/2011   Procedure: LEFT HEART CATHETERIZATION WITH CORONARY ANGIOGRAM;  Surgeon: Minus Breeding, MD;  Location: [facility] CATH LAB;  Service: Cardiovascular;  Laterality: N/A;  . lumbar spur removed    . rectocele surgery    . TOTAL KNEE ARTHROPLASTY Right     There were no vitals filed for this visit.  Subjective Assessment -  04/25/17 1104    Subjective  I have not worn a pad this week. I have my eyes done on 05/14/2017. Urinary leakage is the same.  I have been concentrating on not rushing into the bathroom.  I still have the tendency to get up and go. I can sit for 5 min and wait then increasing it 1 min weekly.     Patient Stated Goals  be able to get to the bathroom without dribbling, not have to wear a pad out of the house    Currently in Pain?  No/denies                      [facility] Adult PT Treatment/Exercise - 04/25/17 0001      Neuro Re-ed    Neuro Re-ed Details   sit on green ball with bounce, sit to stand while therapist holding onto the ball      Lumbar Exercises: Aerobic   Nustep  8 min, level 2 arm and legs seat #7, arms #11      Lumbar Exercises: Standing   Other Standing Lumbar Exercises  lean on mat and alternate shoulder flexion  and hip extension with VC to not twist trunk    Other Standing Lumbar Exercises  trampoline with pelvic floor contraction for heel lifts, marching, weightshifting.                PT Short Term Goals - 03/27/17 1154      PT SHORT TERM GOAL #4   Title  urinary leakage with movement decreased >/= 25% due to improve quality of pelvic floor contraction    Time  4    Period  Weeks    Status  Achieved        PT Long Term Goals - 04/17/17 1154      PT LONG TERM GOAL #1   Title  independent with HEP and how to progress herself    Baseline  still learning    Time  8    Period  Weeks    Status  On-going      PT LONG TERM GOAL #2   Title  pelvic floor strength is 4/5 so her urinary leakage is </= 75%    Baseline  leakage is 45% better    Time  8    Period  Weeks    Status  On-going      PT LONG TERM GOAL #3   Title  reduce pad use to light day pad 1 time per day and not have to use a depends    Time  8    Period  Weeks    Status  Achieved      PT LONG TERM GOAL #4   Title  able to get out of her chair and walk to the commode without urinary leakage due to improve coordination of the pelvic floor muscles    Time  8    Period  Weeks    Status  Achieved            Plan - 04/25/17 1107    Clinical Impression Statement  Patient was able to move up and down on trampoline without leaking urine. Patient is working on extending the urge to void delay for 5 min. Patient is going out without wearing a pad.  Patient will benefit from skilled therapy to reduce the urinary leakage when she waits too long and reduce the urgency increaseing the time to urinate.  Rehab Potential  Excellent    Clinical Impairments Affecting Rehab Potential  Abdominalplasty 02/14/2016; Pelvic Prolapse surgery  for rectocele 1990; hysterectomy 1983    PT Frequency  1x / week    PT Duration  8 weeks    PT Treatment/Interventions  Biofeedback;Neuromuscular re-education;Patient/family  education;Therapeutic exercise;Therapeutic activities;Scar mobilization;Manual techniques    PT Next Visit Plan  lower abdominal exercise;  pelvic floor contraction with sneeze and cough; tampoline, urge to void    PT Home Exercise Plan  progress as needed    Consulted and Agree with Plan of Care  Patient       Patient will benefit from skilled therapeutic intervention in order to improve the following deficits and impairments:  Increased fascial restricitons, Decreased scar mobility, Increased muscle spasms, Decreased strength, Decreased activity tolerance, Decreased endurance  Visit Diagnosis: Muscle weakness (generalized)  Unspecified lack of coordination  Other muscle spasm     Problem List Patient Active Problem List   Diagnosis Date Noted  . Cluster headache, not intractable 05/04/2016  . History of colonic polyps 01-15-202016  . CAD (coronary artery disease) 07/23/2014  . Glaucoma (increased eye pressure) 07/30/2013  . OSA on CPAP 07/30/2013  . Obesity, unspecified 07/30/2013  . Obesity (BMI 30-39.9) 03/18/2013  . Abdominal pain, other specified site 03/18/2013  . ERRONEOUS ENCOUNTER--DISREGARD 03/06/2013  . Internal hemorrhoids with other complication 13/09/6576  . Nonspecific abnormal finding in stool contents 11/05/2012  . Abdominal pain, left upper quadrant 11/05/2012  . Diffuse pain 07/24/2012  . Migraine headache without aura 07/23/2012  . Headache, paroxysmal hemicrania, chronic 07/23/2012  . OSA (obstructive sleep apnea) 06/11/2012  . NSTEMI (non-ST elevated myocardial infarction) ([place]) 09/28/2011  . IBS (irritable bowel syndrome) 03/22/2011  . Hyperlipidemia 12/21/2010  . DIARRHEA, CHRONIC 01/13/2010  . COCCYGEAL PAIN 05/11/2009  . Anorectal pain 02/02/2009  . HIP PAIN, LEFT 02/02/2009  . CORNEAL DISORDER 12/11/2008  . POSTMENOPAUSAL STATUS 12/11/2008  . ADVERSE DRUG REACTION 12/11/2008  . DYSPHAGIA UNSPECIFIED 10/02/2008  . BACK PAIN 07/15/2008  .  Dysmetabolic syndrome X 46/96/2952  . OSTEOARTHRITIS, KNEE, RIGHT 06/19/2007  . CARPAL TUNNEL SYNDROME 04/25/2007  . ADENOMATOUS COLONIC POLYP 04/23/2007  . GERD 04/23/2007  . DIVERTICULAR DISEASE 04/23/2007  . HIATAL HERNIA 11/16/2006  . ANXIETY STATE NOS 11/12/2006  . Essential hypertension 10/30/2006    Earlie Counts, PT 04/25/17 1:09 PM   [facility] [address] [place], [place], [address] Phone: [phone]   Fax:  [phone]  Name: ESSYNCE MUNSCH MRN: [id number] Date of Birth: May 17, 1945

## 2017-04-30 ENCOUNTER — Ambulatory Visit: Payer: Medicare Other | Admitting: Cardiology

## 2017-05-01 ENCOUNTER — Ambulatory Visit: Payer: Medicare Other | Admitting: Physical Therapy

## 2017-05-01 DIAGNOSIS — M6281 Muscle weakness (generalized): Secondary | ICD-10-CM

## 2017-05-01 DIAGNOSIS — R279 Unspecified lack of coordination: Secondary | ICD-10-CM | POA: Diagnosis not present

## 2017-05-01 DIAGNOSIS — M62838 Other muscle spasm: Secondary | ICD-10-CM

## 2017-05-01 NOTE — Therapy (Signed)
[facility] [address] [place], [place], [address] Phone: [phone]   Fax:  [phone]  Physical Therapy Treatment  Patient Details  Name: Connie Owens MRN: [id number] Date of Birth: September 19, 1945 Referring Provider: Dr. Bobbye Charleston   Encounter Date: 05/01/2017  PT End of Session - 05/01/17 1159    Visit Number  8    Date for PT Re-Evaluation  06/12/17    Authorization Type  medicare    PT Start Time  1148    PT Stop Time  1226    PT Time Calculation (min)  38 min    Activity Tolerance  Patient tolerated treatment well    Behavior During Therapy  [facility] for tasks assessed/performed       Past Medical History:  Diagnosis Date  . Anxiety state    NOS  . Arthritis    osteo...right knee  . Back pain   . CAD (coronary artery disease)    NSTEMI 8/13 => LHC 09/27/11: oLM 50%, dLM 30%, oLAD 60-70%, pLAD 50%, mLAD 70%, pD1 30%, pD2 40%, oRCA 25%, mRCA 40%, EF 50% with ant HK => LAD not amenable to PCI => med Rx  unless recurrent angina = > consider CABG  . Carpal tunnel syndrome   . Corneal disorder   . Diverticular disease   . Dysmetabolic syndrome X   . GERD (gastroesophageal reflux disease)   . 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   [name] Headache, paroxysmal hemicrania, chronic 07/23/2012    Referral to BOTOX evaluation with dr Rexene Alberts or Krista Blue .  [name] Heart attack ([place]) 09/2011  . Hernia    hiatal  . HLD (hyperlipidemia)   . Hx of adenomatous colonic polyps   . Hypertension    essen. NOS  . Ischemic cardiomyopathy    a.  Echo 09/29/11: EF 30-35% with inferior, posterior, lateral AK, anterior severe HK, mild LVH, mild MR, PASP 40;  => b. follow up echo 10/13:  EF 55-60%, Gr 1 diast dysfn, mild MR  . Migraine headache without aura 07/23/2012    Left sided  Lamonte Sakai thought to be migrainous  , hypersensitivity to touch , photophobia ,  no nausea.   [name]  Postmenopausal status   . Shoulder fracture   . Sleep apnea    CPAP    Past Surgical History:  Procedure Laterality Date  . ABDOMINAL HYSTERECTOMY    . ABDOMINOPLASTY  10/23/2016  . ankle cystectomy Right   . APPENDECTOMY    . BRACHIOPLASTY Bilateral 05/02/2016  . BREAST EXCISIONAL BIOPSY Right 02/15/1994  . BREAST LUMPECTOMY Right 1996  . BUNIONECTOMY Right   . CARDIAC CATHETERIZATION  09/28/2011   Dr Acie Fredrickson(  to see Dr Ron Parker)  . CHOLECYSTECTOMY    . COLONOSCOPY W/ POLYPECTOMY    . 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 CORNEAL TRANSPLANT Left    x1  . HEMORRHOID SURGERY  12/01/2010, 2014   [place] hemorrhoid ligation/pexy  . LEFT HEART CATHETERIZATION WITH CORONARY ANGIOGRAM N/A 09/28/2011   Procedure: LEFT HEART CATHETERIZATION WITH CORONARY ANGIOGRAM;  Surgeon: Minus Breeding, MD;  Location: [facility] CATH LAB;  Service: Cardiovascular;  Laterality: N/A;  . lumbar spur removed    . rectocele surgery    . TOTAL KNEE ARTHROPLASTY Right     There were no vitals filed for this visit.  Subjective Assessment -  05/01/17 1200    Subjective  I only wore a pad for precaution.  I had no leakage. I have been holding the pelvic floor contraction and no leakage for 7 out of 10 times. I feel like I have improved mentally and physically.     Patient Stated Goals  be able to get to the bathroom without dribbling, not have to wear a pad out of the house    Currently in Pain?  No/denies    Multiple Pain Sites  No                      [facility] Adult PT Treatment/Exercise - 05/01/17 0001      Neuro Re-ed    Neuro Re-ed Details   sit on green ball with bounce,alternate shoulder flexion, shoulder abduction, pelvic sway, alternate knee extension, alternate hi pflexion,  sit to stand while therapist holding onto the ball      Lumbar Exercises: Aerobic   Nustep  8 min, level 2 arm and legs seat #7, arms #11      Lumbar Exercises: Standing   Other  Standing Lumbar Exercises  trampoline with pelvic floor contraction for heel lifts, marching, weightshifting.       Lumbar Exercises: Supine   Bridge  15 reps;1 second      Lumbar Exercises: Sidelying   Clam  Right;Left;10 reps;1 second red band; abdominal and pelvic floor contraction               PT Short Term Goals - 03/27/17 1154      PT SHORT TERM GOAL #4   Title  urinary leakage with movement decreased >/= 25% due to improve quality of pelvic floor contraction    Time  4    Period  Weeks    Status  Achieved        PT Long Term Goals - 04/17/17 1154      PT LONG TERM GOAL #1   Title  independent with HEP and how to progress herself    Baseline  still learning    Time  8    Period  Weeks    Status  On-going      PT LONG TERM GOAL #2   Title  pelvic floor strength is 4/5 so her urinary leakage is </= 75%    Baseline  leakage is 45% better    Time  8    Period  Weeks    Status  On-going      PT LONG TERM GOAL #3   Title  reduce pad use to light day pad 1 time per day and not have to use a depends    Time  8    Period  Weeks    Status  Achieved      PT LONG TERM GOAL #4   Title  able to get out of her chair and walk to the commode without urinary leakage due to improve coordination of the pelvic floor muscles    Time  8    Period  Weeks    Status  Achieved            Plan - 05/01/17 1222    Clinical Impression Statement  Patient able to exercise without leaking urine.  Patient wore one pad for just in case but no leakage.  Patient is able to get to the bathroom 7 out of 10 times without leaking urine.  Patient will benefit from skilled therapy to  reduce the  urinary leakage when she waits too long and reduce the urgency increaseing the time to urinate.     Rehab Potential  Excellent    Clinical Impairments Affecting Rehab Potential  Abdominalplasty 02/14/2016; Pelvic Prolapse surgery  for rectocele 1990; hysterectomy 1983    PT Frequency  1x / week     PT Duration  8 weeks    PT Treatment/Interventions  Biofeedback;Neuromuscular re-education;Patient/family education;Therapeutic exercise;Therapeutic activities;Scar mobilization;Manual techniques    PT Next Visit Plan  Discharge next visit    PT Home Exercise Plan  progress as needed    Consulted and Agree with Plan of Care  Patient       Patient will benefit from skilled therapeutic intervention in order to improve the following deficits and impairments:  Increased fascial restricitons, Decreased scar mobility, Increased muscle spasms, Decreased strength, Decreased activity tolerance, Decreased endurance  Visit Diagnosis: Muscle weakness (generalized)  Unspecified lack of coordination  Other muscle spasm     Problem List Patient Active Problem List   Diagnosis Date Noted  . Cluster headache, not intractable 05/04/2016  . History of colonic polyps 03-17-2014  . CAD (coronary artery disease) 07/23/2014  . Glaucoma (increased eye pressure) 07/30/2013  . OSA on CPAP 07/30/2013  . Obesity, unspecified 07/30/2013  . Obesity (BMI 30-39.9) 03/18/2013  . Abdominal pain, other specified site 03/18/2013  . ERRONEOUS ENCOUNTER--DISREGARD 03/06/2013  . Internal hemorrhoids with other complication 32/35/5732  . Nonspecific abnormal finding in stool contents 11/05/2012  . Abdominal pain, left upper quadrant 11/05/2012  . Diffuse pain 07/24/2012  . Migraine headache without aura 07/23/2012  . Headache, paroxysmal hemicrania, chronic 07/23/2012  . OSA (obstructive sleep apnea) 06/11/2012  . NSTEMI (non-ST elevated myocardial infarction) ([place]) 09/28/2011  . IBS (irritable bowel syndrome) 03/22/2011  . Hyperlipidemia 12/21/2010  . DIARRHEA, CHRONIC 01/13/2010  . COCCYGEAL PAIN 05/11/2009  . Anorectal pain 02/02/2009  . HIP PAIN, LEFT 02/02/2009  . CORNEAL DISORDER 12/11/2008  . POSTMENOPAUSAL STATUS 12/11/2008  . ADVERSE DRUG REACTION 12/11/2008  . DYSPHAGIA UNSPECIFIED 10/02/2008   . BACK PAIN 07/15/2008  . Dysmetabolic syndrome X 20/25/4270  . OSTEOARTHRITIS, KNEE, RIGHT 06/19/2007  . CARPAL TUNNEL SYNDROME 04/25/2007  . ADENOMATOUS COLONIC POLYP 04/23/2007  . GERD 04/23/2007  . DIVERTICULAR DISEASE 04/23/2007  . HIATAL HERNIA 11/16/2006  . ANXIETY STATE NOS 11/12/2006  . Essential hypertension 10/30/2006    Earlie Counts, PT 05/01/17 12:27 PM   [facility] [address] [place], [place], [address] Phone: [phone]   Fax:  [phone]  Name: SHALA BAUMBACH MRN: [id number] Date of Birth: 09-16-1945

## 2017-05-02 ENCOUNTER — Encounter: Payer: Self-pay | Admitting: Cardiology

## 2017-05-08 ENCOUNTER — Ambulatory Visit: Payer: Medicare Other | Admitting: Physical Therapy

## 2017-05-08 ENCOUNTER — Encounter: Payer: Self-pay | Admitting: Physical Therapy

## 2017-05-08 DIAGNOSIS — R279 Unspecified lack of coordination: Secondary | ICD-10-CM

## 2017-05-08 DIAGNOSIS — M62838 Other muscle spasm: Secondary | ICD-10-CM

## 2017-05-08 DIAGNOSIS — M6281 Muscle weakness (generalized): Secondary | ICD-10-CM

## 2017-05-08 NOTE — Therapy (Signed)
[facility] [address] [place], [place], [address] Phone: [phone]   Fax:  [phone]  Physical Therapy Treatment  Patient Details  Name: Connie Owens MRN: [id number] Date of Birth: [birth year] Referring Provider: Dr. Bobbye Charleston   Encounter Date: 05/08/2017  PT End of Session - 05/08/17 1411    Visit Number  9    Date for PT Re-Evaluation  06/12/17    Authorization Type  medicare    PT Start Time  1400    PT Stop Time  1430    PT Time Calculation (min)  30 min    Activity Tolerance  Patient tolerated treatment well    Behavior During Therapy  [facility] for tasks assessed/performed       Past Medical History:  Diagnosis Date  . Anxiety state    NOS  . Arthritis    osteo...right knee  . Back pain   . CAD (coronary artery disease)    NSTEMI 8/13 => LHC 09/27/11: oLM 50%, dLM 30%, oLAD 60-70%, pLAD 50%, mLAD 70%, pD1 30%, pD2 40%, oRCA 25%, mRCA 40%, EF 50% with ant HK => LAD not amenable to PCI => med Rx  unless recurrent angina = > consider CABG  . Carpal tunnel syndrome   . Corneal disorder   . Diverticular disease   . Dysmetabolic syndrome X   . GERD (gastroesophageal reflux disease)   . 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   [name] Headache, paroxysmal hemicrania, chronic 07/23/2012    Referral to BOTOX evaluation with dr Rexene Alberts or Krista Blue .  [name] Heart attack ([place]) 09/2011  . Hernia    hiatal  . HLD (hyperlipidemia)   . Hx of adenomatous colonic polyps   . Hypertension    essen. NOS  . Ischemic cardiomyopathy    a.  Echo 09/29/11: EF 30-35% with inferior, posterior, lateral AK, anterior severe HK, mild LVH, mild MR, PASP 40;  => b. follow up echo 10/13:  EF 55-60%, Gr 1 diast dysfn, mild MR  . Migraine headache without aura 07/23/2012    Left sided  Lamonte Sakai thought to be migrainous  , hypersensitivity to touch , photophobia ,  no nausea.   [name]  Postmenopausal status   . Shoulder fracture   . Sleep apnea    CPAP    Past Surgical History:  Procedure Laterality Date  . ABDOMINAL HYSTERECTOMY    . ABDOMINOPLASTY  10/23/2016  . ankle cystectomy Right   . APPENDECTOMY    . BRACHIOPLASTY Bilateral 05/02/2016  . BREAST EXCISIONAL BIOPSY Right 02/15/1994  . BREAST LUMPECTOMY Right 1996  . BUNIONECTOMY Right   . CARDIAC CATHETERIZATION  09/28/2011   Dr Acie Fredrickson(  to see Dr Ron Parker)  . CHOLECYSTECTOMY    . COLONOSCOPY W/ POLYPECTOMY    . 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 CORNEAL TRANSPLANT Left    x1  . HEMORRHOID SURGERY  12/01/2010, 2014   [place] hemorrhoid ligation/pexy  . LEFT HEART CATHETERIZATION WITH CORONARY ANGIOGRAM N/A 09/28/2011   Procedure: LEFT HEART CATHETERIZATION WITH CORONARY ANGIOGRAM;  Surgeon: Minus Breeding, MD;  Location: [facility] CATH LAB;  Service: Cardiovascular;  Laterality: N/A;  . lumbar spur removed    . rectocele surgery    . TOTAL KNEE ARTHROPLASTY Right     There were no vitals filed for this visit.  Subjective Assessment -  05/08/17 1400    Subjective  I have a cold and sore throat. I do not have a fever. No leakage last weekend.     Patient Stated Goals  be able to get to the bathroom without dribbling, not have to wear a pad out of the house    Currently in Pain?  No/denies    Multiple Pain Sites  No         [facility] PT Assessment - 05/08/17 0001      Assessment   Medical Diagnosis  N39.3 Stress Incontinence    Referring Provider  Dr. Bobbye Charleston    Onset Date/Surgical Date  10/21/16    Prior Therapy  None      Precautions   Precautions  None      Restrictions   Weight Bearing Restrictions  No      Balance Screen   Has the patient fallen in the past 6 months  No    Has the patient had a decrease in activity level because of a fear of falling?   No    Is the patient reluctant to leave their home because of a fear of falling?   No      Home  [employer] residence      Prior Function   Level of Independence  Independent    Leisure  pool aerobic, treadmill, arm weightd      Cognition   Overall Cognitive Status  Within Functional Limits for tasks assessed      Observation/Other Assessments   Skin Integrity  abdominalplasty scar is healed and tight     Focus on Therapeutic Outcomes (FOTO)   limited by 20% with urinary leakage and not wanting to go out of the house as much      Posture/Postural Control   Posture/Postural Control  No significant limitations      Strength   Overall Strength Comments  abdominal strength 3/5    Right Hip Extension  4+/5    Right Hip ABduction  5/5    Left Hip Extension  4+/5    Left Hip ABduction  4+/5      Palpation   Palpation comment  No tenderness located in the abdomen; good mobility of the abdominalplasty            No data recorded    Pelvic Floor Special Questions - 05/08/17 0001    Urinary Leakage  Yes    Pad use  1 just in case    Strength  fair squeeze, definite lift        [facility] Adult PT Treatment/Exercise - 05/08/17 0001      Lumbar Exercises: Aerobic   Nustep  8 min, level 2 arm and legs seat #7, arms #11      Lumbar Exercises: Supine   Ab Set  5 reps;5 seconds    Clam  20 reps;1 second bilateral    Bridge with March  20 reps;1 second             PT Education - 05/08/17 1435    Education provided  Yes    Education Details  reviewed HEP and patient is able to perform correctly    Person(s) Educated  Patient    Methods  Explanation;Demonstration    Comprehension  Verbalized understanding;Returned demonstration       PT Short Term Goals - 03/27/17 1154      PT SHORT TERM GOAL #4   Title  urinary leakage with movement decreased >/= 25% due to improve quality of pelvic floor contraction    Time  4    Period  Weeks    Status  Achieved        PT Long Term Goals - 05/08/17 1407      PT LONG TERM GOAL #1    Title  independent with HEP and how to progress herself    Time  8    Period  Weeks    Status  Achieved      PT LONG TERM GOAL #2   Title  pelvic floor strength is 4/5 so her urinary leakage is </= 75%    Baseline  leakage is 60% better    Time  8    Period  Weeks    Status  Partially Met      PT LONG TERM GOAL #3   Title  reduce pad use to light day pad 1 time per day and not have to use a depends    Baseline  may wear a just in case will wear on occasion    Time  8    Period  Weeks    Status  Achieved      PT LONG TERM GOAL #4   Title  able to get out of her chair and walk to the commode without urinary leakage due to improve coordination of the pelvic floor muscles    Time  8    Status  Achieved            Plan - 05/08/17 1436    Clinical Impression Statement  Patient has increased bilateral hip strength.  Patient reports her urinary leakage is 60% better and she will wear a pad for just in case of leakage. Patient is able to get in and out of the chair without urinary leakage.  Patient is able to hold her urine foe 5-8 minutes when she has the urge to void. Patient will be having eye surgery on 05/14/2017 so she will not be able to exercise till she has clearance from the eye surgeon.  Patient is ready for discharge and independent with her HEP.     Rehab Potential  Excellent    Clinical Impairments Affecting Rehab Potential  Abdominalplasty 02/14/2016; Pelvic Prolapse surgery  for rectocele 1990; hysterectomy 1983    PT Treatment/Interventions  Biofeedback;Neuromuscular re-education;Patient/family education;Therapeutic exercise;Therapeutic activities;Scar mobilization;Manual techniques    PT Next Visit Plan  Discharge to HEP this visit    PT Home Exercise Plan  Current HEP    Consulted and Agree with Plan of Care  Patient       Patient will benefit from skilled therapeutic intervention in order to improve the following deficits and impairments:  Increased fascial  restricitons, Decreased scar mobility, Increased muscle spasms, Decreased strength, Decreased activity tolerance, Decreased endurance  Visit Diagnosis: Muscle weakness (generalized)  Unspecified lack of coordination  Other muscle spasm     Problem List Patient Active Problem List   Diagnosis Date Noted  . Cluster headache, not intractable 05/04/2016  . History of colonic polyps 07-11-2014  . CAD (coronary artery disease) 07/23/2014  . Glaucoma (increased eye pressure) 07/30/2013  . OSA on CPAP 07/30/2013  . Obesity, unspecified 07/30/2013  . Obesity (BMI 30-39.9) 03/18/2013  . Abdominal pain, other specified site 03/18/2013  . ERRONEOUS ENCOUNTER--DISREGARD 03/06/2013  . Internal hemorrhoids with other complication 72/10/4707  . Nonspecific abnormal finding in stool contents 11/05/2012  . Abdominal pain, left upper  quadrant 11/05/2012  . Diffuse pain 07/24/2012  . Migraine headache without aura 07/23/2012  . Headache, paroxysmal hemicrania, chronic 07/23/2012  . OSA (obstructive sleep apnea) 06/11/2012  . NSTEMI (non-ST elevated myocardial infarction) ([place]) 09/28/2011  . IBS (irritable bowel syndrome) 03/22/2011  . Hyperlipidemia 12/21/2010  . DIARRHEA, CHRONIC 01/13/2010  . COCCYGEAL PAIN 05/11/2009  . Anorectal pain 02/02/2009  . HIP PAIN, LEFT 02/02/2009  . CORNEAL DISORDER 12/11/2008  . POSTMENOPAUSAL STATUS 12/11/2008  . ADVERSE DRUG REACTION 12/11/2008  . DYSPHAGIA UNSPECIFIED 10/02/2008  . BACK PAIN 07/15/2008  . Dysmetabolic syndrome X 68/04/2120  . OSTEOARTHRITIS, KNEE, RIGHT 06/19/2007  . CARPAL TUNNEL SYNDROME 04/25/2007  . ADENOMATOUS COLONIC POLYP 04/23/2007  . GERD 04/23/2007  . DIVERTICULAR DISEASE 04/23/2007  . HIATAL HERNIA 11/16/2006  . ANXIETY STATE NOS 11/12/2006  . Essential hypertension 10/30/2006    Earlie Counts, PT 05/08/17 2:43 PM   [facility] [address] [place], [place], [address] Phone: [phone]   Fax:  [phone]  Name: Connie Owens MRN: [id number] Date of Birth: [birth year] PHYSICAL THERAPY DISCHARGE SUMMARY  Visits from Start of Care: 9  Current functional level related to goals / functional outcomes: See above.    Remaining deficits: See above.  Patient reports her urinary leakage is 60% better.    Education / Equipment: HEP Plan: Patient agrees to discharge.  Patient goals were partially met. Patient is being discharged due to meeting the stated rehab goals. Patient is having eye surgery on 05/14/2017 and will not be able to exercise till her eye surgeon gives her clearance. Thank you for the referral. Earlie Counts, PT 05/08/17 2:43 PM   ?????

## 2017-05-09 NOTE — Progress Notes (Signed)
HPI The patient presents for followup of her cardiomyopathy and coronary disease that we are managing medically. She had a non-Q-wave myocardial infarction in the past.  At that time her ejection fraction by echo was 30-35% though it was higher by cath. Follow up echo demonstrated her EF to be 60-65% on the last echo in Jan.  She had a negative POET (Plain Old Exercise Treadmill) in 2018.   We have been unable to titrate meds and her beta blocker was reduced secondary to low BPs.  She returns for follow up.  Since I last saw her she has done well.  She goes to the gym.  The patient denies any new symptoms such as chest discomfort, neck or arm discomfort. There has been no new shortness of breath, PND or orthopnea. There have been no reported palpitations, presyncope or syncope.  Allergies  Allergen Reactions  . Iohexol Rash    rash  . Iodinated Diagnostic Agents Other (See Comments) and Rash    Other Reaction: Other reaction  . Statins   . Imdur [Isosorbide Nitrate]     Severe headaches  . Latex Rash  . Tape Rash and Other (See Comments)    Plastic tape  . Verapamil     REACTION: dizziness    Current Outpatient Medications  Medication Sig Dispense Refill  . aspirin 81 MG tablet Take 81 mg by mouth daily.    [name] azelastine (ASTELIN) 0.1 % nasal spray Place 2 sprays into both nostrils 2 (two) times daily. Use in each nostril as directed 30 mL 12  . benzonatate (TESSALON) 100 MG capsule Take 1 capsule (100 mg total) by mouth 3 (three) times daily as needed for cough. 30 capsule 0  . brimonidine-timolol (COMBIGAN) 0.2-0.5 % ophthalmic solution Apply to eye. 2 gtts L eye, 1 gtt R eye.    . carvedilol (COREG) 6.25 MG tablet Take 1 tablet (6.25 mg total) by mouth 2 (two) times daily. 180 tablet 1  . clobetasol ointment (TEMOVATE) 6.38 % Apply 1 application topically 2 (two) times daily. 90 g 1  . clopidogrel (PLAVIX) 75 MG tablet Take 1 tablet (75 mg total) by mouth daily. 90 tablet 1  .  doxycycline (VIBRA-TABS) 100 MG tablet Take 1 tablet (100 mg total) by mouth 2 (two) times daily. 20 tablet 0  . estradiol (ESTRACE) 1 MG tablet Take 5 mg by mouth daily.     [name] gabapentin (NEURONTIN) 100 MG capsule 2 po tid for pain 540 capsule 1  . latanoprost (XALATAN) 0.005 % ophthalmic solution Place 1 drop into both eyes as directed.    [name] losartan (COZAAR) 50 MG tablet Take 1 tablet (50 mg total) by mouth daily. Needs appt. 90 tablet 1  . nitroGLYCERIN (NITROSTAT) 0.4 MG SL tablet Place 1 tablet (0.4 mg total) under the tongue every 5 (five) minutes x 3 doses as needed for chest pain. 50 tablet 1  . nortriptyline (PAMELOR) 10 MG capsule Take 5 capsules (50 mg total) by mouth at bedtime. 450 capsule 3  . prednisoLONE acetate (PRED FORTE) 1 % ophthalmic suspension Place 1 drop into both eyes 2 (two) times daily.    [name] zolpidem (AMBIEN CR) 12.5 MG CR tablet Take 1 tablet (12.5 mg total) by mouth at bedtime as needed for sleep. 90 tablet 1  . ezetimibe (ZETIA) 10 MG tablet Take 1 tablet (10 mg total) by mouth daily. 90 tablet 3   No current facility-administered medications for this visit.  Past Medical History:  Diagnosis Date  . Anxiety state    NOS  . Arthritis    osteo...right knee  . CAD (coronary artery disease)    NSTEMI 8/13 => LHC 09/27/11: oLM 50%, dLM 30%, oLAD 60-70%, pLAD 50%, mLAD 70%, pD1 30%, pD2 40%, oRCA 25%, mRCA 40%, EF 50% with ant HK => LAD not amenable to PCI => med Rx  unless recurrent angina = > consider CABG  . Carpal tunnel syndrome   . Corneal disorder   . Diverticular disease   . Dysmetabolic syndrome X   . GERD (gastroesophageal reflux disease)   . 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   [name] Headache, paroxysmal hemicrania, chronic 07/23/2012    Referral to BOTOX evaluation with dr Rexene Alberts or Krista Blue .  [name] Heart attack ([place]) 09/2011  . Hernia    hiatal  . HLD (hyperlipidemia)   . Hx of  adenomatous colonic polyps   . Hypertension    essen. NOS  . Ischemic cardiomyopathy    a.  Echo 09/29/11: EF 30-35% with inferior, posterior, lateral AK, anterior severe HK, mild LVH, mild MR, PASP 40;  => b. follow up echo 10/13:  EF 55-60%, Gr 1 diast dysfn, mild MR  . Migraine headache without aura 07/23/2012    Left sided  Lamonte Sakai thought to be migrainous  , hypersensitivity to touch , photophobia ,  no nausea.   . Shoulder fracture   . Sleep apnea    CPAP    Past Surgical History:  Procedure Laterality Date  . ABDOMINAL HYSTERECTOMY    . ABDOMINOPLASTY  10/23/2016  . ankle cystectomy Right   . APPENDECTOMY    . BRACHIOPLASTY Bilateral 05/02/2016  . BREAST EXCISIONAL BIOPSY Right 02/15/1994  . BREAST LUMPECTOMY Right 1996  . BUNIONECTOMY Right   . CARDIAC CATHETERIZATION  09/28/2011   Dr Acie Fredrickson(  to see Dr Ron Parker)  . CHOLECYSTECTOMY    . COLONOSCOPY W/ POLYPECTOMY    . 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 CORNEAL TRANSPLANT Left    x1  . HEMORRHOID SURGERY  12/01/2010, 2014   [place] hemorrhoid ligation/pexy  . LEFT HEART CATHETERIZATION WITH CORONARY ANGIOGRAM N/A 09/28/2011   Procedure: LEFT HEART CATHETERIZATION WITH CORONARY ANGIOGRAM;  Surgeon: Minus Breeding, MD;  Location: [facility] C[facility] LAB;  Service: Cardiovascular;  Laterality: N/A;  . lumbar spur removed    . rectocele surgery    . TOTAL KNEE ARTHROPLASTY Right     ROS:    rosj  PHYSICAL EXAM BP 114/74   Pulse 83   Ht 5\' 5"  (1.651 m)   Wt 196 lb 3.2 oz (89 kg)   BMI 32.65 kg/m   GENERAL:  Well appearing NECK:  No jugular venous distention, waveform within normal limits, carotid upstroke brisk and symmetric, no bruits, no thyromegaly LUNGS:  Clear to auscultation bilaterally CHEST:  Unremarkable HEART:  PMI not displaced or sustained,S1 and S2 within normal limits, no S3, no S4, no clicks, no rubs, no murmurs ABD:  Flat, positive bowel sounds normal in frequency in pitch, no  bruits, no rebound, no guarding, no midline pulsatile mass, no hepatomegaly, no splenomegaly EXT:  2 plus pulses throughout, no edema, no cyanosis no clubbing   EKG:  Sinus rhythm, rate , axis within normal limits, intervals within normal limits, no acute ST-T wave changes.05/10/2017  Lab Results  Component Value  Date   CHOL 188 04/10/2017   TRIG 127.0 04/10/2017   HDL 53.30 04/10/2017   LDLCALC 110 (H) 04/10/2017   LDLDIRECT 140.8 03/18/2013    ASSESSMENT AND PLAN  Coronary Artery Disease:  The patient has no new sypmtoms.  No further cardiovascular testing is indicated.  We will continue with aggressive risk reduction and meds as listed.  I will continue DAPT given that we are maximizing meds.  She can hold this for an upcoming corneal transplant.  (Hold 5 days.)  Ischemic Cardiomyopathy:  Her EF imrpoved to normal earlier this year.  No change in therapy.   Hyperlipidemia:  She has not tolerated statin.  She agrees to take Zetia 10 mg daily.  I would like to get a repeat basic metabolic profile in 2 months.  Hypertension:    Her BP has been running low but she tolerates the meds as listed.

## 2017-05-10 ENCOUNTER — Ambulatory Visit (INDEPENDENT_AMBULATORY_CARE_PROVIDER_SITE_OTHER): Payer: Medicare Other | Admitting: Cardiology

## 2017-05-10 ENCOUNTER — Encounter: Payer: Self-pay | Admitting: Cardiology

## 2017-05-10 ENCOUNTER — Ambulatory Visit (INDEPENDENT_AMBULATORY_CARE_PROVIDER_SITE_OTHER): Payer: Medicare Other | Admitting: Medical

## 2017-05-10 ENCOUNTER — Telehealth: Payer: Self-pay | Admitting: Cardiology

## 2017-05-10 ENCOUNTER — Encounter: Payer: Self-pay | Admitting: Medical

## 2017-05-10 VITALS — BP 134/80 | HR 94 | Temp 98.7°F | Resp 16 | Ht 65.0 in | Wt 196.2 lb

## 2017-05-10 VITALS — BP 114/74 | HR 83 | Ht 65.0 in | Wt 196.2 lb

## 2017-05-10 DIAGNOSIS — Z79899 Other long term (current) drug therapy: Secondary | ICD-10-CM | POA: Diagnosis not present

## 2017-05-10 DIAGNOSIS — J301 Allergic rhinitis due to pollen: Secondary | ICD-10-CM | POA: Diagnosis not present

## 2017-05-10 DIAGNOSIS — J0100 Acute maxillary sinusitis, unspecified: Secondary | ICD-10-CM | POA: Diagnosis not present

## 2017-05-10 DIAGNOSIS — E785 Hyperlipidemia, unspecified: Secondary | ICD-10-CM | POA: Diagnosis not present

## 2017-05-10 DIAGNOSIS — I214 Non-ST elevation (NSTEMI) myocardial infarction: Secondary | ICD-10-CM | POA: Diagnosis not present

## 2017-05-10 MED ORDER — EZETIMIBE 10 MG PO TABS: 10.0000 mg | ORAL_TABLET | Freq: Every day | ORAL | 3 refills | Status: DC

## 2017-05-10 MED ORDER — AZELASTINE HCL 0.1 % NA SOLN: 2.0000 | Freq: Two times a day (BID) | NASAL | 12 refills | Status: DC

## 2017-05-10 MED ORDER — DOXYCYCLINE HYCLATE 100 MG PO TABS: 100.0000 mg | ORAL_TABLET | Freq: Two times a day (BID) | ORAL | 0 refills | Status: DC

## 2017-05-10 MED ORDER — BENZONATATE 100 MG PO CAPS: 100.0000 mg | ORAL_CAPSULE | Freq: Three times a day (TID) | ORAL | 0 refills | Status: DC | PRN

## 2017-05-10 MED ORDER — CEFTRIAXONE SODIUM 1 G IJ SOLR
1.0000 g | Freq: Once | INTRAMUSCULAR | Status: AC
  Administered 2017-05-10: 1 g via INTRAMUSCULAR

## 2017-05-10 NOTE — Progress Notes (Signed)
Subjective:    Patient ID: Connie Owens, female    DOB: 1946-01-26, 72 y.o.   MRN: [id number]  HPI  On Monday pt got mild st. Tuesday got nasal congestion, sinus pain and pnd.   When she blows nose getting some mucus.   Pt has upcoming corneal trasnplant on Monday. If it gets canceled then may have to rescheudule for 6 months.  Pt does have hx of sinus infections in the past.   Review of Systems  Constitutional: Negative for chills, fatigue and fever.  HENT: Positive for congestion, postnasal drip, sinus pressure and sinus pain. Negative for sore throat.   Respiratory: Positive for cough. Negative for chest tightness, shortness of breath and wheezing.   Cardiovascular: Negative for chest pain and palpitations.  Gastrointestinal: Negative for abdominal pain.  Musculoskeletal: Negative for back pain and myalgias.  Skin: Negative for rash.  Neurological: Negative for dizziness and headaches.  Hematological: Negative for adenopathy. Does not bruise/bleed easily.  Psychiatric/Behavioral: Negative for behavioral problems and confusion. The patient is not nervous/anxious.     Past Medical History:  Diagnosis Date  . Anxiety state    NOS  . Arthritis    osteo...right knee  . Back pain   . CAD (coronary artery disease)    NSTEMI 8/13 => LHC 09/27/11: oLM 50%, dLM 30%, oLAD 60-70%, pLAD 50%, mLAD 70%, pD1 30%, pD2 40%, oRCA 25%, mRCA 40%, EF 50% with ant HK => LAD not amenable to PCI => med Rx  unless recurrent angina = > consider CABG  . Carpal tunnel syndrome   . Corneal disorder   . Diverticular disease   . Dysmetabolic syndrome X   . GERD (gastroesophageal reflux disease)   . 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   [name] Headache, paroxysmal hemicrania, chronic 07/23/2012    Referral to BOTOX evaluation with dr Rexene Alberts or Krista Blue .  [name] Heart attack ([place]) 09/2011  . Hernia    hiatal  . HLD (hyperlipidemia)   .  Hx of adenomatous colonic polyps   . Hypertension    essen. NOS  . Ischemic cardiomyopathy    a.  Echo 09/29/11: EF 30-35% with inferior, posterior, lateral AK, anterior severe HK, mild LVH, mild MR, PASP 40;  => b. follow up echo 10/13:  EF 55-60%, Gr 1 diast dysfn, mild MR  . Migraine headache without aura 07/23/2012    Left sided  Lamonte Sakai thought to be migrainous  , hypersensitivity to touch , photophobia ,  no nausea.   [name] Postmenopausal status   . Shoulder fracture   . Sleep apnea    CPAP     Social History   Socioeconomic History  . Marital status: Widowed    Spouse name: Ruthann Cancer  . Number of children: 3  . Years of education: 46  . Highest education level: Not on file  Occupational History  . 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 Financial resource strain: Not on file  . Food insecurity:    Worry: Not on file    Inability: Not on file  . Transportation needs:    Medical: Not on file    Non-medical: Not on file  Tobacco Use  . Smoking status: Never Smoker  . Smokeless tobacco: Never Used  Substance and Sexual Activity  . Alcohol use: Yes    Comment: rare  .  Drug use: No  . Sexual activity: Not on file  Lifestyle  . Physical activity:    Days per week: Not on file    Minutes per session: Not on file  . Stress: Not on file  Relationships  . Social connections:    Talks on phone: Not on file    Gets together: Not on file    Attends religious service: Not on file    Active member of club or organization: Not on file    Attends meetings of clubs or organizations: Not on file    Relationship status: Not on file  . Intimate partner violence:    Fear of current or ex partner: Not on file    Emotionally abused: Not on file    Physically abused: Not on file    Forced sexual activity: Not on file  Other Topics Concern  . Not on file  Social History Narrative   Patient is widowed,([place] past 10/02/13) and lives at home  with her husband.   Patient has three adult children.   Patient has a college education.   Patient is right-handed.   Patient drinks two cups of coffee daily.   Regular exercise   Patient is retired.    Past Surgical History:  Procedure Laterality Date  . ABDOMINAL HYSTERECTOMY    . ABDOMINOPLASTY  10/23/2016  . ankle cystectomy Right   . APPENDECTOMY    . BRACHIOPLASTY Bilateral 05/02/2016  . BREAST EXCISIONAL BIOPSY Right 02/15/1994  . BREAST LUMPECTOMY Right 1996  . BUNIONECTOMY Right   . CARDIAC CATHETERIZATION  09/28/2011   Dr Acie Fredrickson(  to see Dr Ron Parker)  . CHOLECYSTECTOMY    . COLONOSCOPY W/ POLYPECTOMY    . 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 CORNEAL TRANSPLANT Left    x1  . HEMORRHOID SURGERY  12/01/2010, 2014   [place] hemorrhoid ligation/pexy  . LEFT HEART CATHETERIZATION WITH CORONARY ANGIOGRAM N/A 09/28/2011   Procedure: LEFT HEART CATHETERIZATION WITH CORONARY ANGIOGRAM;  Surgeon: Minus Breeding, MD;  Location: [facility] CATH LAB;  Service: Cardiovascular;  Laterality: N/A;  . lumbar spur removed    . rectocele surgery    . TOTAL KNEE ARTHROPLASTY Right     Family History  Problem Relation Age of Onset  . Breast cancer Mother   . Heart disease Father   . Stroke Father   . Dementia Father   . Prostate cancer Father   . Diabetes Sister        3 sisters   . Heart disease Sister        1 sister CABG  . Breast cancer Sister        PTE pre & post Dx of ca  . Diabetes Paternal Grandfather   . Breast cancer Sister   . Colon cancer Neg Hx     Allergies  Allergen Reactions  . Iohexol Rash    rash  . Iodinated Diagnostic Agents Other (See Comments) and Rash    Other Reaction: Other reaction  . Statins   . Imdur [Isosorbide Nitrate]     Severe headaches  . Latex Rash  . Tape Rash and Other (See Comments)    Plastic tape  . Verapamil     REACTION: dizziness    Current Outpatient Medications on File Prior to Visit    Medication Sig Dispense Refill  . aspirin 81 MG tablet Take 81 mg by mouth daily.    . brimonidine-timolol (COMBIGAN)  0.2-0.5 % ophthalmic solution Apply to eye. 2 gtts L eye, 1 gtt R eye.    . carvedilol (COREG) 6.25 MG tablet Take 1 tablet (6.25 mg total) by mouth 2 (two) times daily. 180 tablet 1  . clobetasol ointment (TEMOVATE) 2.97 % Apply 1 application topically 2 (two) times daily. 90 g 1  . clopidogrel (PLAVIX) 75 MG tablet Take 1 tablet (75 mg total) by mouth daily. 90 tablet 1  . estradiol (ESTRACE) 1 MG tablet Take 5 mg by mouth daily.     [name] gabapentin (NEURONTIN) 100 MG capsule 2 po tid for pain 540 capsule 1  . losartan (COZAAR) 50 MG tablet Take 1 tablet (50 mg total) by mouth daily. Needs appt. 90 tablet 1  . nitroGLYCERIN (NITROSTAT) 0.4 MG SL tablet Place 1 tablet (0.4 mg total) under the tongue every 5 (five) minutes x 3 doses as needed for chest pain. 50 tablet 1  . nortriptyline (PAMELOR) 10 MG capsule Take 5 capsules (50 mg total) by mouth at bedtime. 450 capsule 3  . prednisoLONE acetate (PRED FORTE) 1 % ophthalmic suspension Place 1 drop into both eyes 2 (two) times daily.    [name] zolpidem (AMBIEN CR) 12.5 MG CR tablet Take 1 tablet (12.5 mg total) by mouth at bedtime as needed for sleep. 90 tablet 1   No current facility-administered medications on file prior to visit.     BP 134/80 (BP Location: Right Arm, Patient Position: Sitting, Cuff Size: Large)   Pulse 94   Temp 98.7 F (37.1 C) (Oral)   Resp 16   Ht 5\' 5"  (1.651 m)   Wt 196 lb 3.2 oz (89 kg)   SpO2 97%   BMI 32.65 kg/m       Objective:   Physical Exam  General  Mental Status - Alert. General Appearance - Well groomed. Not in acute distress.  Skin Rashes- No Rashes.  HEENT Head- Normal. Ear Auditory Canal - Left- Normal. Right - Normal.Tympanic Membrane- Left- Normal. Right- Normal. Eye Sclera/Conjunctiva- Left- Normal. Right- Normal. Nose & Sinuses Nasal Mucosa- Left-  Boggy and Congested.  Right-  Boggy and  Congested.Bilateral maxillary and frontal sinus pressure. Mouth & Throat Lips: Upper Lip- Normal: no dryness, cracking, pallor, cyanosis, or vesicular eruption. Lower Lip-Normal: no dryness, cracking, pallor, cyanosis or vesicular eruption. Buccal Mucosa- Bilateral- No Aphthous ulcers. Oropharynx- No Discharge or Erythema. Tonsils: Characteristics- Bilateral- No Erythema or Congestion. Size/Enlargement- Bilateral- No enlargement. Discharge- bilateral-None.  Neck Neck- Supple. No Masses.   Chest and Lung Exam Auscultation: Breath Sounds:-Clear even and unlabored.  Cardiovascular Auscultation:Rythm- Regular, rate and rhythm. Murmurs & Other Heart Sounds:Ausculatation of the heart reveal- No Murmurs.  Lymphatic Head & Neck General Head & Neck Lymphatics: Bilateral: Description- No Localized lymphadenopathy.       Assessment & Plan:  You appear to have a sinus infection. I am prescribing doxycycline antibiotic for the infection. To help with the nasal congestion I prescribed nasal steroid flonase. For your associated cough, I prescribed cough medicine benzonatate.  In attempt to expidite healing/recovery prior to surgery on Monday we gave you rocephin 1 gram IM.  Rest, hydrate, tylenol for fever.  Follow up in 7 days or as needed.

## 2017-05-10 NOTE — Telephone Encounter (Signed)
New Message    Patient is coming today for an appointment and she has a sinus infection she wants to know if Dr Percival Spanish can give her something for it as well

## 2017-05-10 NOTE — Telephone Encounter (Signed)
Patient seen by Dr Percival Spanish today

## 2017-05-10 NOTE — Addendum Note (Signed)
Addended by: Wynonia Musty A on: 05/10/2017 11:09 AM   Modules accepted: Orders

## 2017-05-10 NOTE — Patient Instructions (Addendum)
You appear to have a sinus infection. I am prescribing doxycycline antibiotic for the infection. To help with the nasal congestion I prescribed nasal steroid flonase. For your associated cough, I prescribed cough medicine benzonatate.  In attempt to expidite healing/recovery prior to surgery on Monday we gave you rocephin 1 gram IM.  Rest, hydrate, tylenol for fever.  Follow up in 7 days or as needed.

## 2017-05-10 NOTE — Patient Instructions (Addendum)
MEDICATION INSTRUCTIONS  START ZETIA ( EZETIMIBE) 10 MG DAILY- FOR CHOLESTEROL   LABS CMP FOR 2 MONTHS   Your physician wants you to follow-up in [place]. You will receive a reminder letter in the mail two months in advance. If you don't receive a letter, please call our office to schedule the follow-up appointment.   If you need a refill on your cardiac medications before your next appointment, please call your pharmacy.

## 2017-05-10 NOTE — Progress Notes (Signed)
Sinus

## 2017-05-10 NOTE — Telephone Encounter (Signed)
Left message to call back  

## 2017-05-14 DIAGNOSIS — Z8669 Personal history of other diseases of the nervous system and sense organs: Secondary | ICD-10-CM | POA: Diagnosis not present

## 2017-05-14 DIAGNOSIS — T86841 Corneal transplant failure: Secondary | ICD-10-CM | POA: Diagnosis not present

## 2017-05-14 DIAGNOSIS — Z7982 Long term (current) use of aspirin: Secondary | ICD-10-CM | POA: Diagnosis not present

## 2017-05-14 DIAGNOSIS — Z79899 Other long term (current) drug therapy: Secondary | ICD-10-CM | POA: Diagnosis not present

## 2017-05-14 DIAGNOSIS — M170 Bilateral primary osteoarthritis of knee: Secondary | ICD-10-CM | POA: Diagnosis not present

## 2017-05-14 DIAGNOSIS — H18231 Secondary corneal edema, right eye: Secondary | ICD-10-CM | POA: Diagnosis not present

## 2017-05-14 DIAGNOSIS — H1851 Endothelial corneal dystrophy: Secondary | ICD-10-CM | POA: Diagnosis not present

## 2017-05-14 DIAGNOSIS — I2510 Atherosclerotic heart disease of native coronary artery without angina pectoris: Secondary | ICD-10-CM | POA: Diagnosis not present

## 2017-05-14 DIAGNOSIS — I10 Essential (primary) hypertension: Secondary | ICD-10-CM | POA: Diagnosis not present

## 2017-05-14 DIAGNOSIS — Z7902 Long term (current) use of antithrombotics/antiplatelets: Secondary | ICD-10-CM | POA: Diagnosis not present

## 2017-05-14 DIAGNOSIS — L409 Psoriasis, unspecified: Secondary | ICD-10-CM | POA: Diagnosis not present

## 2017-05-14 DIAGNOSIS — I252 Old myocardial infarction: Secondary | ICD-10-CM | POA: Diagnosis not present

## 2017-05-14 DIAGNOSIS — G4730 Sleep apnea, unspecified: Secondary | ICD-10-CM | POA: Diagnosis not present

## 2017-05-25 ENCOUNTER — Ambulatory Visit: Payer: Self-pay | Admitting: *Deleted

## 2017-05-25 NOTE — Telephone Encounter (Signed)
This information was given to Dr. Carollee Herter and she said if patient was concerned about the cornea transplant she needs to call her eye doctor to get advise. For the tick bite she may want to go to an urgent care since is the end of the day and we will not be able to get her in here. She may need antibiotics. She can follow up with [place] in about 2 weeks for blood work to check for L[place] disease. Called patient back with this information she verbalizes understanding.

## 2017-05-25 NOTE — Telephone Encounter (Signed)
  Patient is inquiring if a prophylactic antibiotic is needed given her recent surgery. Patient had a cornea transplant last week. This morning she found a tick, with a star on its back, attached to her back.  This morning, there is a red area-pea sized, where she removed the tick. She did remove the entire tick as she has it in a bag crawling around.  She denies headache, fever and any rash at this time.  Advised she clean and apply antibiotic cream to the area and watch for signs of infection. Call to [facility] to inquire with Dr. Carollee Herter to see if patient needs to be seen.  Answer Assessment - Initial Assessment Questions 1. TYPE of TICK: "Is it a wood tick or a deer tick?" If unsure, ask: "What size was the tick?" "Did it look more like a watermelon seed or a poppy seed?"     White star tick, size of a grain of rice 2. LOCATION: "Where is the tick bite located?"    Rt side of back below shoulder blade 3. ONSET: "How long do you think the tick was attached before you removed it?" (Hours or days)     Less than 1 day. 4. TETANUS: "When was the last tetanus booster?"    no 5. PREGNANCY: "Is there any chance you are pregnant?" "When was your last menstrual period?"   no  Protocols used: TICK BITE-A-AH

## 2017-06-13 DIAGNOSIS — M1712 Unilateral primary osteoarthritis, left knee: Secondary | ICD-10-CM | POA: Diagnosis not present

## 2017-07-03 ENCOUNTER — Ambulatory Visit (INDEPENDENT_AMBULATORY_CARE_PROVIDER_SITE_OTHER): Payer: Medicare Other | Admitting: Medical

## 2017-07-03 ENCOUNTER — Encounter: Payer: Self-pay | Admitting: Medical

## 2017-07-03 VITALS — BP 129/63 | HR 70 | Temp 97.7°F | Ht 65.0 in | Wt 196.8 lb

## 2017-07-03 DIAGNOSIS — W57XXXA Bitten or stung by nonvenomous insect and other nonvenomous arthropods, initial encounter: Secondary | ICD-10-CM | POA: Diagnosis not present

## 2017-07-03 DIAGNOSIS — R21 Rash and other nonspecific skin eruption: Secondary | ICD-10-CM

## 2017-07-03 DIAGNOSIS — S30860A Insect bite (nonvenomous) of lower back and pelvis, initial encounter: Secondary | ICD-10-CM | POA: Diagnosis not present

## 2017-07-03 MED ORDER — MUPIROCIN 2 % EX OINT: TOPICAL_OINTMENT | CUTANEOUS | 0 refills | Status: DC

## 2017-07-03 MED ORDER — PREDNISONE 10 MG PO TABS
ORAL_TABLET | ORAL | 0 refills | Status: DC
Start: 2017-07-03 — End: 2017-09-03

## 2017-07-03 NOTE — Progress Notes (Signed)
Subjective:    Patient ID: Connie Owens, female    DOB: 1945/10/02, 72 y.o.   MRN: [id number]  HPI  Pt in for tick bite. She found a tick on her rt side back/latisimus dorsi area. Pt had tick on her on 05-25-2017. She removed tick and then Eye MD gave her doxycycline around time of her eye surgery. She also notes late march up until time of tick bite she was on doxy or sinus infection.  The small tick bite area is just not healing. Only itches a little bit.   Pt sees Dr. Milagros Reap in [place](dermatology)    Review of Systems  Constitutional: Negative for chills, fatigue and fever.  Respiratory: Negative for choking, chest tightness, shortness of breath and wheezing.   Cardiovascular: Negative for chest pain and palpitations.  Musculoskeletal: Negative for back pain.  Skin: Positive for rash.  Neurological: Negative for dizziness, seizures and headaches.  Hematological: Negative for adenopathy. Does not bruise/bleed easily.  Psychiatric/Behavioral: Negative for behavioral problems and confusion.   Past Medical History:  Diagnosis Date  . Anxiety state    NOS  . Arthritis    osteo...right knee  . CAD (coronary artery disease)    NSTEMI 8/13 => LHC 09/27/11: oLM 50%, dLM 30%, oLAD 60-70%, pLAD 50%, mLAD 70%, pD1 30%, pD2 40%, oRCA 25%, mRCA 40%, EF 50% with ant HK => LAD not amenable to PCI => med Rx  unless recurrent angina = > consider CABG  . Carpal tunnel syndrome   . Corneal disorder   . Diverticular disease   . Dysmetabolic syndrome X   . GERD (gastroesophageal reflux disease)   . 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   [name] Headache, paroxysmal hemicrania, chronic 07/23/2012    Referral to BOTOX evaluation with dr Rexene Alberts or Krista Blue .  [name] Heart attack ([place]) 09/2011  . Hernia    hiatal  . HLD (hyperlipidemia)   . Hx of adenomatous colonic polyps   . Hypertension    essen. NOS  . Ischemic  cardiomyopathy    a.  Echo 09/29/11: EF 30-35% with inferior, posterior, lateral AK, anterior severe HK, mild LVH, mild MR, PASP 40;  => b. follow up echo 10/13:  EF 55-60%, Gr 1 diast dysfn, mild MR  . Migraine headache without aura 07/23/2012    Left sided  Lamonte Sakai thought to be migrainous  , hypersensitivity to touch , photophobia ,  no nausea.   . Shoulder fracture   . Sleep apnea    CPAP     Social History   Socioeconomic History  . Marital status: Widowed    Spouse name: Ruthann Cancer  . Number of children: 3  . Years of education: 4  . Highest education level: Not on file  Occupational History  . 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 Financial resource strain: Not on file  . Food insecurity:    Worry: Not on file    Inability: Not on file  . Transportation needs:    Medical: Not on file    Non-medical: Not on file  Tobacco Use  . Smoking status: Never Smoker  . Smokeless tobacco: Never Used  Substance and Sexual Activity  . Alcohol use: Yes    Comment: rare  . Drug use: No  . Sexual activity: Not on file  Lifestyle  . Physical activity:  Days per week: Not on file    Minutes per session: Not on file  . Stress: Not on file  Relationships  . Social connections:    Talks on phone: Not on file    Gets together: Not on file    Attends religious service: Not on file    Active member of club or organization: Not on file    Attends meetings of clubs or organizations: Not on file    Relationship status: Not on file  . Intimate partner violence:    Fear of current or ex partner: Not on file    Emotionally abused: Not on file    Physically abused: Not on file    Forced sexual activity: Not on file  Other Topics Concern  . Not on file  Social History Narrative   Patient is widowed,([place] past 10/02/13) and lives at home with her husband.   Patient has three adult children.   Patient has a college education.   Patient is  right-handed.   Patient drinks two cups of coffee daily.   Regular exercise   Patient is retired.    Past Surgical History:  Procedure Laterality Date  . ABDOMINAL HYSTERECTOMY    . ABDOMINOPLASTY  10/23/2016  . ankle cystectomy Right   . APPENDECTOMY    . BRACHIOPLASTY Bilateral 05/02/2016  . BREAST EXCISIONAL BIOPSY Right 02/15/1994  . BREAST LUMPECTOMY Right 1996  . BUNIONECTOMY Right   . CARDIAC CATHETERIZATION  09/28/2011   Dr Acie Fredrickson(  to see Dr Ron Parker)  . CHOLECYSTECTOMY    . COLONOSCOPY W/ POLYPECTOMY    . 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 CORNEAL TRANSPLANT Left    x1  . HEMORRHOID SURGERY  12/01/2010, 2014   Sibley hemorrhoid ligation/pexy  . LEFT HEART CATHETERIZATION WITH CORONARY ANGIOGRAM N/A 09/28/2011   Procedure: LEFT HEART CATHETERIZATION WITH CORONARY ANGIOGRAM;  Surgeon: Minus Breeding, MD;  Location: [facility] CATH LAB;  Service: Cardiovascular;  Laterality: N/A;  . lumbar spur removed    . rectocele surgery    . TOTAL KNEE ARTHROPLASTY Right     Family History  Problem Relation Age of Onset  . Breast cancer Mother   . Heart disease Father   . Stroke Father   . Dementia Father   . Prostate cancer Father   . Diabetes Sister        3 sisters   . Heart disease Sister        1 sister CABG  . Breast cancer Sister        PTE pre & post Dx of ca  . Diabetes Paternal Grandfather   . Breast cancer Sister   . Colon cancer Neg Hx     Allergies  Allergen Reactions  . Iohexol Rash    rash  . Iodinated Diagnostic Agents Other (See Comments) and Rash    Other Reaction: Other reaction  . Statins   . Imdur [Isosorbide Nitrate]     Severe headaches  . Latex Rash  . Tape Rash and Other (See Comments)    Plastic tape  . Verapamil     REACTION: dizziness    Current Outpatient Medications on File Prior to Visit  Medication Sig Dispense Refill  . aspirin 81 MG tablet Take 81 mg by mouth daily.    [name] azelastine  (ASTELIN) 0.1 % nasal spray Place 2 sprays into both nostrils 2 (two) times daily. Use in each nostril as directed  30 mL 12  . benzonatate (TESSALON) 100 MG capsule Take 1 capsule (100 mg total) by mouth 3 (three) times daily as needed for cough. 30 capsule 0  . brimonidine-timolol (COMBIGAN) 0.2-0.5 % ophthalmic solution Apply to eye. 2 gtts L eye, 1 gtt R eye.    . carvedilol (COREG) 6.25 MG tablet Take 1 tablet (6.25 mg total) by mouth 2 (two) times daily. 180 tablet 1  . clobetasol ointment (TEMOVATE) 1.44 % Apply 1 application topically 2 (two) times daily. 90 g 1  . clopidogrel (PLAVIX) 75 MG tablet Take 1 tablet (75 mg total) by mouth daily. 90 tablet 1  . doxycycline (VIBRA-TABS) 100 MG tablet Take 1 tablet (100 mg total) by mouth 2 (two) times daily. (Patient not taking: Reported on 07/03/2017) 20 tablet 0  . estradiol (ESTRACE) 1 MG tablet Take 5 mg by mouth daily.     [name] ezetimibe (ZETIA) 10 MG tablet Take 1 tablet (10 mg total) by mouth daily. 90 tablet 3  . gabapentin (NEURONTIN) 100 MG capsule 2 po tid for pain 540 capsule 1  . latanoprost (XALATAN) 0.005 % ophthalmic solution Place 1 drop into both eyes as directed.    [name] losartan (COZAAR) 50 MG tablet Take 1 tablet (50 mg total) by mouth daily. Needs appt. 90 tablet 1  . nitroGLYCERIN (NITROSTAT) 0.4 MG SL tablet Place 1 tablet (0.4 mg total) under the tongue every 5 (five) minutes x 3 doses as needed for chest pain. 50 tablet 1  . nortriptyline (PAMELOR) 10 MG capsule Take 5 capsules (50 mg total) by mouth at bedtime. 450 capsule 3  . prednisoLONE acetate (PRED FORTE) 1 % ophthalmic suspension Place 1 drop into both eyes 2 (two) times daily.    [name] zolpidem (AMBIEN CR) 12.5 MG CR tablet Take 1 tablet (12.5 mg total) by mouth at bedtime as needed for sleep. 90 tablet 1   No current facility-administered medications on file prior to visit.     BP 129/63   Pulse 70   Temp 97.7 F (36.5 C) (Oral)   Ht 5\' 5"  (1.651 m)   Wt 196 lb 12.8  oz (89.3 kg)   PF 98 L/min   BMI 32.75 kg/m       Objective:   Physical Exam  General- No acute distress. Pleasant patient. Neck- Full range of motion, no jvd Lungs- Clear, even and unlabored. Heart- regular rate and rhythm. Neurologic- CNII- XII grossly intact.  Rt side lower latisimus area- 3 mm x 1 mm small red area. With small wound where tick was attached. No dc. No warmth.       Assessment & Plan:  You had recent tick bite about 6 weeks ago.  He received 2 courses of doxycycline.  The area is still mildly irritated.  I think this is more of a allergic action at this point.  No obvious infection present.  I prescribed you 4 days taper prednisone.  Also you can apply mupirocin twice daily.  Presently since you finished 2 courses of doxycycline I do not think third is necessary.  You can go ahead and get a tick bite studies today.  I am going to refer you to dermatologist for further evaluation and treatment.    Follow-up with [place] in 10 to 14 days or sooner if referral to dermatologist delayed  Mackie Pai, PA-C

## 2017-07-03 NOTE — Patient Instructions (Signed)
You had recent tick bite about 6 weeks ago.  He received 2 courses of doxycycline.  The area is still mildly irritated.  I think this is more of a allergic action at this point.  No obvious infection present.  I prescribed you 4 days taper prednisone.  Also you can apply mupirocin twice daily.  Presently since you finished 2 courses of doxycycline I do not think third is necessary.  You can go ahead and get a tick bite studies today.  I am going to refer you to dermatologist for further evaluation and treatment.    Follow-up with [place] in 10 to 14 days or sooner if referral to dermatologist delayed

## 2017-07-04 LAB — ROCKY MTN SPOTTED FVR ABS PNL(IGG+IGM)
RMSF IGG: NOT DETECTED
RMSF IGM: NOT DETECTED

## 2017-07-04 LAB — B. BURGDORFI ANTIBODIES

## 2017-07-10 ENCOUNTER — Other Ambulatory Visit: Payer: Self-pay | Admitting: Obstetrics and Gynecology

## 2017-07-10 DIAGNOSIS — Z1231 Encounter for screening mammogram for malignant neoplasm of breast: Secondary | ICD-10-CM

## 2017-07-11 ENCOUNTER — Other Ambulatory Visit: Payer: Self-pay | Admitting: Cardiology

## 2017-07-11 MED ORDER — CLOPIDOGREL BISULFATE 75 MG PO TABS: 75.0000 mg | ORAL_TABLET | Freq: Every day | ORAL | 3 refills | Status: DC

## 2017-07-11 MED ORDER — LOSARTAN POTASSIUM 50 MG PO TABS: 50.0000 mg | ORAL_TABLET | Freq: Every day | ORAL | 3 refills | Status: DC

## 2017-07-11 MED ORDER — CARVEDILOL 6.25 MG PO TABS: 6.2500 mg | ORAL_TABLET | Freq: Two times a day (BID) | ORAL | 3 refills | Status: DC

## 2017-07-11 NOTE — Telephone Encounter (Signed)
Pt calling requesting a refill on Carvedilol, clopidogrel and losartan, sent to [facility]

## 2017-07-13 DIAGNOSIS — L308 Other specified dermatitis: Secondary | ICD-10-CM | POA: Diagnosis not present

## 2017-07-31 DIAGNOSIS — M25511 Pain in right shoulder: Secondary | ICD-10-CM | POA: Insufficient documentation

## 2017-08-01 ENCOUNTER — Ambulatory Visit
Admission: RE | Admit: 2017-08-01 | Discharge: 2017-08-01 | Disposition: A | Discharge status: Home / Self Care | Payer: Medicare Other | Source: Ambulatory Visit | Attending: Obstetrics and Gynecology | Admitting: Obstetrics and Gynecology

## 2017-08-01 DIAGNOSIS — Z1231 Encounter for screening mammogram for malignant neoplasm of breast: Secondary | ICD-10-CM

## 2017-08-08 DIAGNOSIS — M25511 Pain in right shoulder: Secondary | ICD-10-CM | POA: Diagnosis not present

## 2017-08-14 DIAGNOSIS — M25511 Pain in right shoulder: Secondary | ICD-10-CM | POA: Diagnosis not present

## 2017-08-21 ENCOUNTER — Telehealth: Payer: Self-pay | Admitting: Cardiology

## 2017-08-21 NOTE — Telephone Encounter (Signed)
   Primary Cardiologist: Minus Breeding, MD  Chart reviewed as part of pre-operative protocol coverage. Given past medical history of non ischemic cardiomyopathy and return of EF to normal, and time since last visit, based on ACC/AHA guidelines, Connie Owens would be at acceptable risk for the planned procedure without further cardiovascular testing.   She may hold the plavix 5 days prior to procedure and resume afterwards.   I will route this recommendation to the requesting party via Epic fax function and remove from pre-op pool.  Please call with questions.  Cecilie Kicks, NP 08/21/2017, 3:15 PM

## 2017-08-21 NOTE — Telephone Encounter (Signed)
   [facility] Pre-operative Risk Assessment    Request for surgical clearance:  1. What type of surgery is being performed? Right shoulder: right RCR with possible graft and anchor aromioplasty    2. When is this surgery scheduled? Pending    3. What type of clearance is required (medical clearance vs. Pharmacy clearance to hold med vs. Both)? Both   4. Are there any medications that need to be held prior to surgery and how long? Plavix   5. Practice name and name of physician performing surgery? Dr. Latanya Maudlin @ [facility]   6. What is your office phone number [phone]    7.   What is your office fax number [phone] (attn: Glendale Chard)  8.   Anesthesia type (None, local, MAC, general) ? Not specified    Connie Owens 08/21/2017, 9:28 AM  _________________________________________________________________   (provider comments below)

## 2017-08-24 ENCOUNTER — Telehealth: Payer: Self-pay | Admitting: *Deleted

## 2017-08-24 NOTE — Telephone Encounter (Signed)
Received Medical/Surgical Clearance Form from [facility]; forwarded to provider/SLS 07/12

## 2017-08-28 DIAGNOSIS — H52213 Irregular astigmatism, bilateral: Secondary | ICD-10-CM | POA: Diagnosis not present

## 2017-08-28 DIAGNOSIS — Z947 Corneal transplant status: Secondary | ICD-10-CM | POA: Diagnosis not present

## 2017-09-03 ENCOUNTER — Ambulatory Visit (INDEPENDENT_AMBULATORY_CARE_PROVIDER_SITE_OTHER): Payer: Medicare Other | Admitting: Family Medicine

## 2017-09-03 ENCOUNTER — Encounter: Payer: Self-pay | Admitting: Family Medicine

## 2017-09-03 VITALS — BP 149/83 | HR 77 | Temp 98.1°F | Ht 65.0 in | Wt 199.6 lb

## 2017-09-03 DIAGNOSIS — M75121 Complete rotator cuff tear or rupture of right shoulder, not specified as traumatic: Secondary | ICD-10-CM | POA: Diagnosis not present

## 2017-09-03 NOTE — Telephone Encounter (Signed)
Surgical Clearance form completed and faxed to attn: Glendale Chard @ [phone].

## 2017-09-03 NOTE — Progress Notes (Signed)
Subjective:    Connie Owens is a 72 y.o. female who presents to the office today for a preoperative consultation at the request of surgeon gioffre who plans on performing Right rcr with possible graft and anchor acromioplasty R shoulder on TBA -. This consultation is requested for the specific conditions prompting preoperative evaluation (i.e. because of potential affect on operative risk): CAD, cardiomyopathy and non Qwave MI, . Planned anesthesia: general. The patient has the following known anesthesia issues: no problems . Patients bleeding risk: no recent abnormal bleeding. Patient does not have objections to receiving blood products if needed.  The following portions of the patient's history were reviewed and updated as appropriate: She  has a past medical history of Anxiety state, Arthritis, CAD (coronary artery disease), Carpal tunnel syndrome, Corneal disorder, Diverticular disease, Dysmetabolic syndrome X, GERD (gastroesophageal reflux disease), Glaucoma (increased eye pressure) (07/30/2013), Headache, paroxysmal hemicrania, chronic (07/23/2012), Heart attack ([place]) (09/2011), Hernia, HLD (hyperlipidemia), adenomatous colonic polyps, Hypertension, Ischemic cardiomyopathy, Migraine headache without aura (07/23/2012), Shoulder fracture, and Sleep apnea..  Review of Systems Review of Systems  Constitutional: Negative for activity change, appetite change and fatigue.  HENT: Negative for hearing loss, congestion, tinnitus and ear discharge.  dentist q49m Eyes: Negative for visual disturbance  Respiratory: Negative for cough, chest tightness and shortness of breath.   Cardiovascular: Negative for chest pain, palpitations and leg swelling.  Gastrointestinal: Negative for abdominal pain, diarrhea, constipation and abdominal distention.  Genitourinary: Negative for urgency, frequency, decreased urine volume and difficulty urinating.  Musculoskeletal: Negative for back pain, arthralgias and gait  problem.  Skin: Negative for color change, pallor and rash.  Neurological: Negative for dizziness, light-headedness, numbness and headaches.  Hematological: Negative for adenopathy. Does not bruise/bleed easily.  Psychiatric/Behavioral: Negative for suicidal ideas, confusion, sleep disturbance, self-injury, dysphoric mood, decreased concentration and agitation.        Objective:    BP (!) 149/83 (BP Location: Left Arm, Patient Position: Sitting, Cuff Size: Large)   Pulse 77   Temp 98.1 F (36.7 C) (Oral)   Ht 5\' 5"  (1.651 m)   Wt 199 lb 9.6 oz (90.5 kg)   SpO2 99%   BMI 33.22 kg/m  General appearance: alert, cooperative, appears stated age and no distress Head: Normocephalic, without obvious abnormality, atraumatic Eyes: negative findings: lids and lashes normal and pupils equal, round, reactive to light and accomodation Ears: normal TM's and external ear canals both ears Nose: Nares normal. Septum midline. Mucosa normal. No drainage or sinus tenderness. Throat: lips, mucosa, and tongue normal; teeth and gums normal Neck: no adenopathy, no carotid bruit, no JVD, supple, symmetrical, trachea midline and thyroid not enlarged, symmetric, no tenderness/mass/nodules Back: symmetric, no curvature. ROM normal. No CVA tenderness. Lungs: clear to auscultation bilaterally Heart: regular rate and rhythm, S1, S2 normal, no murmur, click, rub or gallop Abdomen: soft, non-tender; bowel sounds normal; no masses,  no organomegaly Extremities: extremities normal, atraumatic, no cyanosis or edema Pulses: 2+ and symmetric Skin: Skin color, texture, turgor normal. No rashes or lesions Lymph nodes: Cervical, supraclavicular, and axillary nodes normal. Neurologic: Grossly normal  Cardiographics ECG: per cardiology Echocardiogram: not done  Imaging Chest x-ray: not done   Lab Review  Office Visit on 07/03/2017  Component Date Value  . RMSF IgG 07/03/2017 NOT DETECTED   . RMSF IgM 07/03/2017  NOT DETECTED   . B burgdorferi Ab IgG+IgM 07/03/2017 <0.90   Office Visit on 04/10/2017  Component Date Value  . WBC 04/10/2017 9.1   .  RBC 04/10/2017 4.58   . Hemoglobin 04/10/2017 13.4   . HCT 04/10/2017 40.3   . MCV 04/10/2017 88.0   . MCHC 04/10/2017 33.2   . RDW 04/10/2017 14.8   . Platelets 04/10/2017 380.0   . Neutrophils Relative % 04/10/2017 65.7   . Lymphocytes Relative 04/10/2017 25.0   . Monocytes Relative 04/10/2017 7.9   . Eosinophils Relative 04/10/2017 1.0   . Basophils Relative 04/10/2017 0.4   . Neutro Abs 04/10/2017 5.9   . Lymphs Abs 04/10/2017 2.3   . Monocytes Absolute 04/10/2017 0.7   . Eosinophils Absolute 04/10/2017 0.1   . Basophils Absolute 04/10/2017 0.0   . Sodium 04/10/2017 136   . Potassium 04/10/2017 5.1   . Chloride 04/10/2017 101   . CO2 04/10/2017 28   . Glucose, Bld 04/10/2017 115*  . BUN 04/10/2017 13   . Creatinine, Ser 04/10/2017 0.77   . Total Bilirubin 04/10/2017 0.4   . Alkaline Phosphatase 04/10/2017 63   . AST 04/10/2017 17   . ALT 04/10/2017 15   . Total Protein 04/10/2017 7.3   . Albumin 04/10/2017 3.8   . Calcium 04/10/2017 10.1   . GFR 04/10/2017 78.33   . Cholesterol 04/10/2017 188   . Triglycerides 04/10/2017 127.0   . HDL 04/10/2017 53.30   . VLDL 04/10/2017 25.4   . LDL Cholesterol 04/10/2017 110*  . Total CHOL/HDL Ratio 04/10/2017 4   . NonHDL 04/10/2017 135.04   . Sed Rate 04/10/2017 22   . Anti Nuclear Antibody(AN* 04/10/2017 NEGATIVE   . Rhuematoid fact SerPl-aC* 04/10/2017 <14   . HIV 1&2 Ab, 4th Generati* 04/10/2017 NON-REACTIVE       Assessment:      72 y.o. female with planned surgery as above.   Known risk factors for perioperative complications: Coronary disease   Difficulty with intubation is not anticipated.  Cardiac Risk Estimation: low--see card clearance   Plan:    cardiology cleared pt Medically cleared F/u after surgery

## 2017-09-03 NOTE — Patient Instructions (Signed)

## 2017-09-18 DIAGNOSIS — M542 Cervicalgia: Secondary | ICD-10-CM | POA: Diagnosis not present

## 2017-10-10 ENCOUNTER — Encounter: Payer: Self-pay | Admitting: Adult Health

## 2017-10-11 ENCOUNTER — Encounter: Payer: Self-pay | Admitting: Adult Health

## 2017-10-11 ENCOUNTER — Ambulatory Visit (INDEPENDENT_AMBULATORY_CARE_PROVIDER_SITE_OTHER): Payer: Medicare Other | Admitting: Adult Health

## 2017-10-11 VITALS — BP 131/74 | HR 62 | Ht 65.0 in | Wt 202.6 lb

## 2017-10-11 DIAGNOSIS — G4733 Obstructive sleep apnea (adult) (pediatric): Secondary | ICD-10-CM | POA: Diagnosis not present

## 2017-10-11 DIAGNOSIS — Z9989 Dependence on other enabling machines and devices: Secondary | ICD-10-CM

## 2017-10-11 MED ORDER — NORTRIPTYLINE HCL 10 MG PO CAPS: 50.0000 mg | ORAL_CAPSULE | Freq: Every day | ORAL | 3 refills | Status: DC

## 2017-10-11 MED ORDER — ZOLPIDEM TARTRATE ER 12.5 MG PO TBCR: 12.5000 mg | EXTENDED_RELEASE_TABLET | Freq: Every evening | ORAL | 1 refills | Status: DC | PRN

## 2017-10-11 NOTE — Patient Instructions (Addendum)
Your Plan:  Continue using CPAP nightly Continue Nortriptyline and ambien Try taking gabapentin as directed If your symptoms worsen or you develop new symptoms please let us know.       Thank you for coming to see [place] at [facility]. I hope we have been able to provide you high quality care today.  You may receive a patient satisfaction survey over the next few weeks. We would appreciate your feedback and comments so that we may continue to improve ourselves and the health of our patients.

## 2017-10-11 NOTE — Progress Notes (Signed)
Fax confirmation received Connie Owens [place]  [phone]. sy

## 2017-10-11 NOTE — Progress Notes (Signed)
PATIENT: Connie Owens DOB: 15-Jun-1945  REASON FOR VISIT: follow up HISTORY FROM: patient  HISTORY OF PRESENT ILLNESS: Today 10/11/17: Connie Owens is a 72 year old female with a history of obstructive sleep apnea on CPAP.  Her CPAP download indicates that she use her machine 29 out of 30 days for compliance of 97%.  She use her machine greater than 4 hours 14 days for compliance of 47%.  On average she uses her machine 4 hours and 19 minutes.  Her residual AHI is 0.9 on 6 cm of water with EPR 3.  She does not have a significant leak.  Her headaches have remained relatively stable with nortriptyline.  She reports in the left temple if she has some pressure sensation typically when she lays down at night.  She reports that her PCP started her on gabapentin however she is not taking it 3 times a day but only at night.  She returns today for an evaluation.  HISTORY (Copied from Connie Owens notes):   The patient reports that her CPAP compliance has dropped because the ear feels too hot and steamy to be comfortable.  We will address this today and change her humidifier settings, I also will see if she has a heated coil in her tubing system which may add additional heat and humidity.  Secondary she reports a bandlike headache that occurs whenever she lays down.  There is a tenderness around the crown of the head beginning at the high temporal level to the parietal into the occipital region.  This sensation causes her to turn over a lot of the restless.  She qualified this is a soreness and discomfort rather than a stabbing pain, there is no burning there is no sharp quality to the pain she does not feel this pain when she sits walks or is active in daytime only in bed. 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 [facility]. Her cardiac function has recovered- CAD with MI in 2014, EF 60% I-09-2017.  Connie Owens will follow up in late  March.    REVIEW OF SYSTEMS: Out of a complete 14 system review of symptoms, the patient complains only of the following symptoms, and all other reviewed systems are negative.  See HPI  ALLERGIES: Allergies  Allergen Reactions  . Iohexol Rash    rash  . Iodinated Diagnostic Agents Other (See Comments) and Rash    Other Reaction: Other reaction  . Statins   . Imdur [Isosorbide Nitrate]     Severe headaches  . Latex Rash  . Tape Rash and Other (See Comments)    Plastic tape  . Verapamil     REACTION: dizziness    HOME MEDICATIONS: Outpatient Medications Prior to Visit  Medication Sig Dispense Refill  . aspirin 81 MG tablet Take 81 mg by mouth daily.    [name] azelastine (ASTELIN) 0.1 % nasal spray Place 2 sprays into both nostrils 2 (two) times daily. Use in each nostril as directed 30 mL 12  . brimonidine-timolol (COMBIGAN) 0.2-0.5 % ophthalmic solution Apply to eye. 2 gtts L eye, 1 gtt R eye.    . carvedilol (COREG) 6.25 MG tablet Take 1 tablet (6.25 mg total) by mouth 2 (two) times daily. 180 tablet 3  . clobetasol ointment (TEMOVATE) 0.94 % Apply 1 application topically 2 (two) times daily. 90 g 1  . clopidogrel (PLAVIX) 75 MG tablet Take 1  tablet (75 mg total) by mouth daily. 90 tablet 3  . estradiol (ESTRACE) 1 MG tablet Take 5 mg by mouth daily.     [name] gabapentin (NEURONTIN) 100 MG capsule 2 po tid for pain 540 capsule 1  . latanoprost (XALATAN) 0.005 % ophthalmic solution Place 1 drop into both eyes as directed.    [name] losartan (COZAAR) 50 MG tablet Take 1 tablet (50 mg total) by mouth daily. Needs appt. 90 tablet 3  . nitroGLYCERIN (NITROSTAT) 0.4 MG SL tablet Place 1 tablet (0.4 mg total) under the tongue every 5 (five) minutes x 3 doses as needed for chest pain. 50 tablet 1  . nortriptyline (PAMELOR) 10 MG capsule Take 5 capsules (50 mg total) by mouth at bedtime. 450 capsule 3  . prednisoLONE acetate (PRED FORTE) 1 % ophthalmic suspension Place 1 drop into both eyes 2 (two)  times daily.    [name] zolpidem (AMBIEN CR) 12.5 MG CR tablet Take 1 tablet (12.5 mg total) by mouth at bedtime as needed for sleep. 90 tablet 1  . mupirocin ointment (BACTROBAN) 2 % Apply to area twice daily 22 g 0   No facility-administered medications prior to visit.     PAST MEDICAL HISTORY: Past Medical History:  Diagnosis Date  . Anxiety state    NOS  . Arthritis    osteo...right knee  . CAD (coronary artery disease)    NSTEMI 8/13 => LHC 09/27/11: oLM 50%, dLM 30%, oLAD 60-70%, pLAD 50%, mLAD 70%, pD1 30%, pD2 40%, oRCA 25%, mRCA 40%, EF 50% with ant HK => LAD not amenable to PCI => med Rx  unless recurrent angina = > consider CABG  . Carpal tunnel syndrome   . Corneal disorder   . Diverticular disease   . Dysmetabolic syndrome X   . GERD (gastroesophageal reflux disease)   . 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   [name] Headache, paroxysmal hemicrania, chronic 07/23/2012    Referral to BOTOX evaluation with Connie Rexene Owens or Connie Owens .  [name] Heart attack ([place]) 09/2011  . Hernia    hiatal  . HLD (hyperlipidemia)   . Hx of adenomatous colonic polyps   . Hypertension    essen. NOS  . Ischemic cardiomyopathy    a.  Echo 09/29/11: EF 30-35% with inferior, posterior, lateral AK, anterior severe HK, mild LVH, mild MR, PASP 40;  => b. follow up echo 10/13:  EF 55-60%, Gr 1 diast dysfn, mild MR  . Migraine headache without aura 07/23/2012    Left sided  Lamonte Sakai thought to be migrainous  , hypersensitivity to touch , photophobia ,  no nausea.   . Shoulder fracture   . Sleep apnea    CPAP    PAST SURGICAL HISTORY: Past Surgical History:  Procedure Laterality Date  . ABDOMINAL HYSTERECTOMY    . ABDOMINOPLASTY  10/23/2016  . ankle cystectomy Right   . APPENDECTOMY    . BRACHIOPLASTY Bilateral 05/02/2016  . BREAST EXCISIONAL BIOPSY Right 02/15/1994  . BREAST LUMPECTOMY Right 1996  . BUNIONECTOMY Right   . CARDIAC CATHETERIZATION   09/28/2011   Connie Owens(  to see Connie Ron Parker)  . CHOLECYSTECTOMY    . COLONOSCOPY W/ POLYPECTOMY    . 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 CORNEAL TRANSPLANT Left    x1  . HEMORRHOID SURGERY  12/01/2010, 2014   [place] hemorrhoid  ligation/pexy  . LEFT HEART CATHETERIZATION WITH CORONARY ANGIOGRAM N/A 09/28/2011   Procedure: LEFT HEART CATHETERIZATION WITH CORONARY ANGIOGRAM;  Surgeon: Minus Breeding, MD;  Location: [facility] CATH LAB;  Service: Cardiovascular;  Laterality: N/A;  . lumbar spur removed    . rectocele surgery    . TOTAL KNEE ARTHROPLASTY Right     FAMILY HISTORY: Family History  Problem Relation Age of Onset  . Breast cancer Mother   . Heart disease Father   . Stroke Father   . Dementia Father   . Prostate cancer Father   . Diabetes Sister        3 sisters   . Heart disease Sister        1 sister CABG  . Breast cancer Sister        PTE pre & post Dx of ca  . Diabetes Paternal Grandfather   . Colon cancer Neg Hx     SOCIAL HISTORY: Social History   Socioeconomic History  . Marital status: Widowed    Spouse name: Ruthann Cancer  . Number of children: 3  . Years of education: 10  . Highest education level: Not on file  Occupational History  . 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 Financial resource strain: Not on file  . Food insecurity:    Worry: Not on file    Inability: Not on file  . Transportation needs:    Medical: Not on file    Non-medical: Not on file  Tobacco Use  . Smoking status: Never Smoker  . Smokeless tobacco: Never Used  Substance and Sexual Activity  . Alcohol use: Yes    Comment: rare  . Drug use: No  . Sexual activity: Not on file  Lifestyle  . Physical activity:    Days per week: Not on file    Minutes per session: Not on file  . Stress: Not on file  Relationships  . Social connections:    Talks on phone: Not on file    Gets together: Not on  file    Attends religious service: Not on file    Active member of club or organization: Not on file    Attends meetings of clubs or organizations: Not on file    Relationship status: Not on file  . Intimate partner violence:    Fear of current or ex partner: Not on file    Emotionally abused: Not on file    Physically abused: Not on file    Forced sexual activity: Not on file  Other Topics Concern  . Not on file  Social History Narrative   Patient is widowed,([place] past 10/02/13) and lives at home with her husband.   Patient has three adult children.   Patient has a college education.   Patient is right-handed.   Patient drinks two cups of coffee daily.   Regular exercise   Patient is retired.      PHYSICAL EXAM  Vitals:   10/11/17 0902  BP: 131/74  Pulse: 62  Weight: 202 lb 9.6 oz (91.9 kg)  Height: 5\' 5"  (1.651 m)   Body mass index is 33.71 kg/m.  Generalized: Well developed, in no acute distress   Neurological examination  Mentation: Alert oriented to time, place, history taking. Follows all commands speech and language fluent Cranial nerve II-XII: Pupils were equal round reactive to light. Extraocular movements were full, visual field were full on confrontational test. Facial sensation  and strength were normal. Uvula tongue midline. Head turning and shoulder shrug  were normal and symmetric. Motor: The motor testing reveals 5 over 5 strength of all 4 extremities. Good symmetric motor tone is noted throughout.  Sensory: Sensory testing is intact to soft touch on all 4 extremities. No evidence of extinction is noted.  Coordination: Cerebellar testing reveals good finger-nose-finger and heel-to-shin bilaterally.  Gait and station: Gait is normal.  Reflexes: Deep tendon reflexes are symmetric and normal bilaterally.   DIAGNOSTIC DATA (LABS, IMAGING, TESTING) - I reviewed patient records, labs, notes, testing and imaging myself where available.  Lab Results    Component Value Date   WBC 9.1 04/10/2017   HGB 13.4 04/10/2017   HCT 40.3 04/10/2017   MCV 88.0 04/10/2017   PLT 380.0 04/10/2017      Component Value Date/Time   NA 136 04/10/2017 1146   K 5.1 04/10/2017 1146   CL 101 04/10/2017 1146   CO2 28 04/10/2017 1146   GLUCOSE 115 (H) 04/10/2017 1146   BUN 13 04/10/2017 1146   CREATININE 0.77 04/10/2017 1146   CREATININE 0.87 11/17/2014 0844   CALCIUM 10.1 04/10/2017 1146   PROT 7.3 04/10/2017 1146   ALBUMIN 3.8 04/10/2017 1146   AST 17 04/10/2017 1146   ALT 15 04/10/2017 1146   ALKPHOS 63 04/10/2017 1146   BILITOT 0.4 04/10/2017 1146   GFRNONAA 68 11/17/2014 0844   GFRAA 79 11/17/2014 0844   Lab Results  Component Value Date   CHOL 188 04/10/2017   HDL 53.30 04/10/2017   LDLCALC 110 (H) 04/10/2017   LDLDIRECT 140.8 03/18/2013   TRIG 127.0 04/10/2017   CHOLHDL 4 04/10/2017   Lab Results  Component Value Date   HGBA1C 5.9 12/29/2013   Lab Results  Component Value Date   VITAMINB12 291 03/18/2013   Lab Results  Component Value Date   TSH 2.00 12/16/2015      ASSESSMENT AND PLAN 72 y.o. year old female  has a past medical history of Anxiety state, Arthritis, CAD (coronary artery disease), Carpal tunnel syndrome, Corneal disorder, Diverticular disease, Dysmetabolic syndrome X, GERD (gastroesophageal reflux disease), Glaucoma (increased eye pressure) (07/30/2013), Headache, paroxysmal hemicrania, chronic (07/23/2012), Heart attack ([place]) (09/2011), Hernia, HLD (hyperlipidemia), adenomatous colonic polyps, Hypertension, Ischemic cardiomyopathy, Migraine headache without aura (07/23/2012), Shoulder fracture, and Sleep apnea. here with:  1.  Obstructive sleep apnea on CPAP 2.  Migraine headaches  The patient CPAP download shows suboptimal compliance.  I encouraged the patient to use her machine greater than 4 hours each night.  She will continue on nortriptyline.  I did advise that she should take gabapentin as directed by her  PCP.  If her symptoms worsen she develops new symptoms she should let us know.  She will follow-up in 6 months or sooner.     Ward Givens, MSN, NP-C 10/11/2017, 9:16 AM [facility] [address] [place], [place] [address] [phone]

## 2017-10-17 NOTE — Progress Notes (Signed)
I agree with the assessment and plan as directed by NP .The patient is known to me .   Sanay Belmar, MD  

## 2017-10-22 NOTE — Progress Notes (Signed)
09-03-17 Surgical clearance on chart from Dr. Cheri Rous  08-21-17 [facility]) Cardiac clearance from Dr. Percival Spanish  05-10-17 (Epic) EKG  02-22-17 (Epic) ECHO

## 2017-10-22 NOTE — Patient Instructions (Addendum)
Connie Owens  10/22/2017   Your procedure is scheduled on: 10-31-17     Report to [facility] Main  Entrance    Report to Admitting at 6:30 AM    Call this number if you have problems the morning of surgery [phone]     Remember: Do not eat food or drink liquids :After Midnight.     Take these medicines the morning of surgery with A SIP OF WATER: Carvedilol (Coreg), Gabapentin (Neurontin). You may also bring and use your eyedrops as needed.                                You may not have any metal on your body including hair pins and              piercings  Do not wear jewelry, make-up, lotions, powders or perfumes, deodorant             Do not wear nail polish.  Do not shave  48 hours prior to surgery.                Do not bring valuables to the hospital. [place].  Contacts, dentures or bridgework may not be worn into surgery.  Leave suitcase in the car. After surgery it may be brought to your room.    Special Instructions: Please bring your mask and tubing for your CPAP machine              Please read over the following fact sheets you were given: _____________________________________________________________________          [facility] - Preparing for Surgery Before surgery, you can play an important role.  Because skin is not sterile, your skin needs to be as free of germs as possible.  You can reduce the number of germs on your skin by washing with CHG (chlorahexidine gluconate) soap before surgery.  CHG is an antiseptic cleaner which kills germs and bonds with the skin to continue killing germs even after washing. Please DO NOT use if you have an allergy to CHG or antibacterial soaps.  If your skin becomes reddened/irritated stop using the CHG and inform your nurse when you arrive at Short Stay. Do not shave (including legs and underarms) for at least 48 hours prior to the first CHG shower.   You may shave your face/neck. Please follow these instructions carefully:  1.  Shower with CHG Soap the night before surgery and the  morning of Surgery.  2.  If you choose to wash your hair, wash your hair first as usual with your  normal  shampoo.  3.  After you shampoo, rinse your hair and body thoroughly to remove the  shampoo.                           4.  Use CHG as you would any other liquid soap.  You can apply chg directly  to the skin and wash                       Gently with a scrungie or clean washcloth.  5.  Apply the CHG Soap to your body  ONLY FROM THE NECK DOWN.   Do not use on face/ open                           Wound or open sores. Avoid contact with eyes, ears mouth and genitals (private parts).                       Wash face,  Genitals (private parts) with your normal soap.             6.  Wash thoroughly, paying special attention to the area where your surgery  will be performed.  7.  Thoroughly rinse your body with warm water from the neck down.  8.  DO NOT shower/wash with your normal soap after using and rinsing off  the CHG Soap.                9.  Pat yourself dry with a clean towel.            10.  Wear clean pajamas.            11.  Place clean sheets on your bed the night of your first shower and do not  sleep with pets. Day of Surgery : Do not apply any lotions/deodorants the morning of surgery.  Please wear clean clothes to the hospital/surgery center.  FAILURE TO FOLLOW THESE INSTRUCTIONS MAY RESULT IN THE CANCELLATION OF YOUR SURGERY PATIENT SIGNATURE_________________________________  NURSE SIGNATURE__________________________________  ________________________________________________________________________   Adam Phenix  An incentive spirometer is a tool that can help keep your lungs clear and active. This tool measures how well you are filling your lungs with each breath. Taking long deep breaths may help reverse or decrease the chance of  developing breathing (pulmonary) problems (especially infection) following:  A long period of time when you are unable to move or be active. BEFORE THE PROCEDURE   If the spirometer includes an indicator to show your best effort, your nurse or respiratory therapist will set it to a desired goal.  If possible, sit up straight or lean slightly forward. Try not to slouch.  Hold the incentive spirometer in an upright position. INSTRUCTIONS FOR USE  1. Sit on the edge of your bed if possible, or sit up as far as you can in bed or on a chair. 2. Hold the incentive spirometer in an upright position. 3. Breathe out normally. 4. Place the mouthpiece in your mouth and seal your lips tightly around it. 5. Breathe in slowly and as deeply as possible, raising the piston or the ball toward the top of the column. 6. Hold your breath for 3-5 seconds or for as long as possible. Allow the piston or ball to fall to the bottom of the column. 7. Remove the mouthpiece from your mouth and breathe out normally. 8. Rest for a few seconds and repeat Steps 1 through 7 at least 10 times every 1-2 hours when you are awake. Take your time and take a few normal breaths between deep breaths. 9. The spirometer may include an indicator to show your best effort. Use the indicator as a goal to work toward during each repetition. 10. After each set of 10 deep breaths, practice coughing to be sure your lungs are clear. If you have an incision (the cut made at the time of surgery), support your incision when coughing by placing a pillow or rolled up towels firmly against it. Once  you are able to get out of bed, walk around indoors and cough well. You may stop using the incentive spirometer when instructed by your caregiver.  RISKS AND COMPLICATIONS  Take your time so you do not get dizzy or light-headed.  If you are in pain, you may need to take or ask for pain medication before doing incentive spirometry. It is harder to take a  deep breath if you are having pain. AFTER USE  Rest and breathe slowly and easily.  It can be helpful to keep track of a log of your progress. Your caregiver can provide you with a simple table to help with this. If you are using the spirometer at home, follow these instructions: [place] IF:   You are having difficultly using the spirometer.  You have trouble using the spirometer as often as instructed.  Your pain medication is not giving enough relief while using the spirometer.  You develop fever of 100.5 F (38.1 C) or higher. SEEK IMMEDIATE MEDICAL CARE IF:   You cough up bloody sputum that had not been present before.  You develop fever of 102 F (38.9 C) or greater.  You develop worsening pain at or near the incision site. MAKE SURE YOU:   Understand these instructions.  Will watch your condition.  Will get help right away if you are not doing well or get worse. Document Released: 06/12/2006 Document Revised: 04/24/2011 Document Reviewed: 08/13/2006 [facility] Patient Information 2014 [place], [place].   ________________________________________________________________________

## 2017-10-25 ENCOUNTER — Other Ambulatory Visit: Payer: Self-pay

## 2017-10-25 ENCOUNTER — Encounter (HOSPITAL_COMMUNITY)
Admission: RE | Admit: 2017-10-25 | Discharge: 2017-10-25 | Disposition: A | Discharge status: Home / Self Care | Payer: Medicare Other | Source: Ambulatory Visit | Attending: Orthopedic Surgery | Admitting: Orthopedic Surgery

## 2017-10-25 ENCOUNTER — Encounter (HOSPITAL_COMMUNITY): Payer: Self-pay

## 2017-10-25 DIAGNOSIS — Z01812 Encounter for preprocedural laboratory examination: Secondary | ICD-10-CM | POA: Insufficient documentation

## 2017-10-25 HISTORY — DX: Other specified postprocedural states: Z98.890

## 2017-10-25 HISTORY — DX: Other complications of anesthesia, initial encounter: T88.59XA

## 2017-10-25 HISTORY — DX: Adverse effect of unspecified anesthetic, initial encounter: T41.45XA

## 2017-10-25 HISTORY — DX: Other specified postprocedural states: R11.2

## 2017-10-25 LAB — COMPREHENSIVE METABOLIC PANEL
ALT: 15 U/L (ref 0–44)
AST: 19 U/L (ref 15–41)
Albumin: 3.9 g/dL (ref 3.5–5.0)
Alkaline Phosphatase: 69 U/L (ref 38–126)
Anion gap: 8 (ref 5–15)
BUN: 12 mg/dL (ref 8–23)
CO2: 27 mmol/L (ref 22–32)
Calcium: 9.6 mg/dL (ref 8.9–10.3)
Chloride: 102 mmol/L (ref 98–111)
Creatinine, Ser: 0.63 mg/dL (ref 0.44–1.00)
GFR calc Af Amer: >60 mL/min (ref >60)
GFR calc non Af Amer: >60 mL/min (ref >60)
Glucose, Bld: 93 mg/dL (ref 70–99)
Potassium: 4.6 mmol/L (ref 3.5–5.1)
Sodium: 137 mmol/L (ref 135–145)
Total Bilirubin: 0.4 mg/dL (ref 0.3–1.2)
Total Protein: 7.4 g/dL (ref 6.5–8.1)

## 2017-10-25 LAB — CBC WITH DIFFERENTIAL/PLATELET
Basophils Absolute: 0 10*3/uL (ref 0.0–0.1)
Basophils Relative: 0 %
Eosinophils Absolute: 0.2 10*3/uL (ref 0.0–0.7)
Eosinophils Relative: 2 %
HCT: 38.4 % (ref 36.0–46.0)
Hemoglobin: 12.3 g/dL (ref 12.0–15.0)
Lymphocytes Relative: 24 %
Lymphs Abs: 2.2 10*3/uL (ref 0.7–4.0)
MCH: 28.1 pg (ref 26.0–34.0)
MCHC: 32 g/dL (ref 30.0–36.0)
MCV: 87.7 fL (ref 78.0–100.0)
Monocytes Absolute: 0.9 10*3/uL (ref 0.1–1.0)
Monocytes Relative: 10 %
Neutro Abs: 5.9 10*3/uL (ref 1.7–7.7)
Neutrophils Relative %: 64 %
Platelets: 361 10*3/uL (ref 150–400)
RBC: 4.38 MIL/uL (ref 3.87–5.11)
RDW: 13.5 % (ref 11.5–15.5)
WBC: 9.1 10*3/uL (ref 4.0–10.5)

## 2017-10-25 LAB — PROTIME-INR
INR: 0.97
Prothrombin Time: 12.8 seconds (ref 11.4–15.2)

## 2017-10-25 LAB — APTT: aPTT: 27 seconds (ref 24–36)

## 2017-10-30 NOTE — Anesthesia Preprocedure Evaluation (Addendum)
Anesthesia Evaluation  Patient identified by MRN, date of birth, ID band Patient awake    Reviewed: Allergy & Precautions, NPO status , Patient's Chart, lab work & pertinent test results, reviewed documented beta blocker date and time   History of Anesthesia Complications (+) PONV and history of anesthetic complications  Airway Mallampati: II  TM Distance: >3 FB Neck ROM: Full    Dental  (+) Dental Advisory Given, Upper Dentures, Partial Lower   Pulmonary sleep apnea and Continuous Positive Airway Pressure Ventilation ,    breath sounds clear to auscultation       Cardiovascular hypertension, Pt. on medications and Pt. on home beta blockers + CAD and + Past MI   Rhythm:Regular Rate:Normal   '19 TTE - EF 55% to 60%. Trivial MR, PR.   '18 Exercise Stress Test - Blood pressure demonstrated a normal response to exercise. There was no ST segment deviation noted during stress. Normal ETT    Neuro/Psych  Headaches, Anxiety    GI/Hepatic Neg liver ROS, GERD  Controlled,  Endo/Other   Obesity   Renal/GU negative Renal ROS  negative genitourinary   Musculoskeletal  (+) Arthritis ,   Abdominal   Peds  Hematology negative hematology ROS (+)   Anesthesia Other Findings   Reproductive/Obstetrics                            Anesthesia Physical Anesthesia Plan  ASA: III  Anesthesia Plan: General   Post-op Pain Management:  Regional for Post-op pain   Induction: Intravenous  PONV Risk Score and Plan: 4 or greater and Treatment may vary due to age or medical condition, Ondansetron, Dexamethasone and Propofol infusion  Airway Management Planned: Oral ETT  Additional Equipment: None  Intra-op Plan:   Post-operative Plan: Extubation in OR  Informed Consent: I have reviewed the patients History and Physical, chart, labs and discussed the procedure including the risks, benefits and alternatives  for the proposed anesthesia with the patient or authorized representative who has indicated his/her understanding and acceptance.   Dental advisory given  Plan Discussed with: CRNA and Anesthesiologist  Anesthesia Plan Comments:        Anesthesia Quick Evaluation

## 2017-10-31 ENCOUNTER — Ambulatory Visit (HOSPITAL_COMMUNITY): Payer: Medicare Other | Admitting: Anesthesiology

## 2017-10-31 ENCOUNTER — Encounter (HOSPITAL_COMMUNITY)
Admission: RE | Disposition: A | Discharge status: Home / Self Care | Payer: Self-pay | Source: Ambulatory Visit | Attending: Orthopedic Surgery

## 2017-10-31 ENCOUNTER — Observation Stay (HOSPITAL_COMMUNITY)
Admission: RE | Admit: 2017-10-31 | Discharge: 2017-11-01 | Disposition: A | Discharge status: Home / Self Care | Payer: Medicare Other | Source: Ambulatory Visit | Attending: Orthopedic Surgery | Admitting: Orthopedic Surgery

## 2017-10-31 ENCOUNTER — Other Ambulatory Visit: Payer: Self-pay

## 2017-10-31 ENCOUNTER — Encounter (HOSPITAL_COMMUNITY): Payer: Self-pay

## 2017-10-31 DIAGNOSIS — R079 Chest pain, unspecified: Secondary | ICD-10-CM | POA: Insufficient documentation

## 2017-10-31 DIAGNOSIS — E669 Obesity, unspecified: Secondary | ICD-10-CM | POA: Diagnosis not present

## 2017-10-31 DIAGNOSIS — M12811 Other specific arthropathies, not elsewhere classified, right shoulder: Secondary | ICD-10-CM | POA: Diagnosis present

## 2017-10-31 DIAGNOSIS — H409 Unspecified glaucoma: Secondary | ICD-10-CM | POA: Insufficient documentation

## 2017-10-31 DIAGNOSIS — Z9989 Dependence on other enabling machines and devices: Secondary | ICD-10-CM | POA: Insufficient documentation

## 2017-10-31 DIAGNOSIS — M7541 Impingement syndrome of right shoulder: Secondary | ICD-10-CM | POA: Diagnosis not present

## 2017-10-31 DIAGNOSIS — I2510 Atherosclerotic heart disease of native coronary artery without angina pectoris: Secondary | ICD-10-CM | POA: Diagnosis not present

## 2017-10-31 DIAGNOSIS — S46091A Other injury of muscle(s) and tendon(s) of the rotator cuff of right shoulder, initial encounter: Secondary | ICD-10-CM | POA: Diagnosis not present

## 2017-10-31 DIAGNOSIS — E785 Hyperlipidemia, unspecified: Secondary | ICD-10-CM | POA: Insufficient documentation

## 2017-10-31 DIAGNOSIS — Z96651 Presence of right artificial knee joint: Secondary | ICD-10-CM | POA: Diagnosis not present

## 2017-10-31 DIAGNOSIS — I10 Essential (primary) hypertension: Secondary | ICD-10-CM | POA: Diagnosis not present

## 2017-10-31 DIAGNOSIS — Z79890 Hormone replacement therapy (postmenopausal): Secondary | ICD-10-CM | POA: Insufficient documentation

## 2017-10-31 DIAGNOSIS — Z7982 Long term (current) use of aspirin: Secondary | ICD-10-CM | POA: Diagnosis not present

## 2017-10-31 DIAGNOSIS — Z6834 Body mass index (BMI) 34.0-34.9, adult: Secondary | ICD-10-CM | POA: Diagnosis not present

## 2017-10-31 DIAGNOSIS — G4730 Sleep apnea, unspecified: Secondary | ICD-10-CM | POA: Insufficient documentation

## 2017-10-31 DIAGNOSIS — I252 Old myocardial infarction: Secondary | ICD-10-CM | POA: Insufficient documentation

## 2017-10-31 DIAGNOSIS — M75101 Unspecified rotator cuff tear or rupture of right shoulder, not specified as traumatic: Secondary | ICD-10-CM | POA: Insufficient documentation

## 2017-10-31 DIAGNOSIS — Z79899 Other long term (current) drug therapy: Secondary | ICD-10-CM | POA: Insufficient documentation

## 2017-10-31 DIAGNOSIS — G8918 Other acute postprocedural pain: Secondary | ICD-10-CM | POA: Diagnosis not present

## 2017-10-31 DIAGNOSIS — R0600 Dyspnea, unspecified: Secondary | ICD-10-CM

## 2017-10-31 DIAGNOSIS — M25511 Pain in right shoulder: Secondary | ICD-10-CM | POA: Diagnosis present

## 2017-10-31 HISTORY — PX: SHOULDER OPEN ROTATOR CUFF REPAIR: SHX2407

## 2017-10-31 SURGERY — REPAIR, ROTATOR CUFF, OPEN
Anesthesia: General | Site: Shoulder | Laterality: Right

## 2017-10-31 MED ORDER — PHENYLEPHRINE HCL 10 MG/ML IJ SOLN
INTRAMUSCULAR | Status: AC
  Filled 2017-10-31: qty 1

## 2017-10-31 MED ORDER — CEFAZOLIN SODIUM-DEXTROSE 2-4 GM/100ML-% IV SOLN
INTRAVENOUS | Status: AC
  Filled 2017-10-31: qty 100

## 2017-10-31 MED ORDER — LIDOCAINE 2% (20 MG/ML) 5 ML SYRINGE
INTRAMUSCULAR | Status: AC
  Filled 2017-10-31: qty 5

## 2017-10-31 MED ORDER — 0.9 % SODIUM CHLORIDE (POUR BTL) OPTIME
TOPICAL | Status: DC | PRN
  Administered 2017-10-31: 1000 mL

## 2017-10-31 MED ORDER — CEFAZOLIN SODIUM-DEXTROSE 1-4 GM/50ML-% IV SOLN
1.0000 g | Freq: Four times a day (QID) | INTRAVENOUS | Status: AC
  Administered 2017-10-31 – 2017-11-01 (×3): 1 g via INTRAVENOUS
  Filled 2017-10-31 (×3): qty 50

## 2017-10-31 MED ORDER — ROCURONIUM BROMIDE 10 MG/ML (PF) SYRINGE
PREFILLED_SYRINGE | INTRAVENOUS | Status: DC | PRN
  Administered 2017-10-31: 50 mg via INTRAVENOUS

## 2017-10-31 MED ORDER — LOSARTAN POTASSIUM 50 MG PO TABS
50.0000 mg | ORAL_TABLET | Freq: Every evening | ORAL | Status: DC
  Administered 2017-10-31: 50 mg via ORAL
  Filled 2017-10-31: qty 1

## 2017-10-31 MED ORDER — HYDROMORPHONE HCL 1 MG/ML IJ SOLN: 0.5000 mg | INTRAMUSCULAR | Status: DC | PRN

## 2017-10-31 MED ORDER — ONDANSETRON HCL 4 MG PO TABS: 4.0000 mg | ORAL_TABLET | Freq: Four times a day (QID) | ORAL | Status: DC | PRN

## 2017-10-31 MED ORDER — AZELASTINE HCL 0.1 % NA SOLN: 2.0000 | Freq: Two times a day (BID) | NASAL | Status: DC | PRN

## 2017-10-31 MED ORDER — SODIUM CHLORIDE 0.9 % IV SOLN
INTRAVENOUS | Status: DC | PRN
  Administered 2017-10-31: 20 ug/min via INTRAVENOUS

## 2017-10-31 MED ORDER — METOCLOPRAMIDE HCL 5 MG/ML IJ SOLN: 5.0000 mg | Freq: Three times a day (TID) | INTRAMUSCULAR | Status: DC | PRN

## 2017-10-31 MED ORDER — ESTRADIOL 1 MG PO TABS
1.0000 mg | ORAL_TABLET | Freq: Every day | ORAL | Status: DC
  Administered 2017-10-31 – 2017-11-01 (×2): 1 mg via ORAL
  Filled 2017-10-31 (×2): qty 1

## 2017-10-31 MED ORDER — NORTRIPTYLINE HCL 25 MG PO CAPS
50.0000 mg | ORAL_CAPSULE | Freq: Every day | ORAL | Status: DC
  Administered 2017-10-31: 50 mg via ORAL
  Filled 2017-10-31: qty 2

## 2017-10-31 MED ORDER — CHLORHEXIDINE GLUCONATE 4 % EX LIQD: 60.0000 mL | Freq: Once | CUTANEOUS | Status: DC

## 2017-10-31 MED ORDER — MIDAZOLAM HCL 2 MG/2ML IJ SOLN
INTRAMUSCULAR | Status: AC
  Filled 2017-10-31: qty 2

## 2017-10-31 MED ORDER — GABAPENTIN 100 MG PO CAPS
200.0000 mg | ORAL_CAPSULE | Freq: Every day | ORAL | Status: DC
  Administered 2017-10-31: 200 mg via ORAL
  Filled 2017-10-31: qty 2

## 2017-10-31 MED ORDER — LACTATED RINGERS IV SOLN: INTRAVENOUS | Status: DC

## 2017-10-31 MED ORDER — OXYCODONE HCL 5 MG PO TABS
10.0000 mg | ORAL_TABLET | ORAL | Status: DC | PRN
  Administered 2017-10-31 – 2017-11-01 (×3): 10 mg via ORAL

## 2017-10-31 MED ORDER — SUGAMMADEX SODIUM 200 MG/2ML IV SOLN
INTRAVENOUS | Status: DC | PRN
  Administered 2017-10-31: 200 mg via INTRAVENOUS

## 2017-10-31 MED ORDER — ACETAMINOPHEN 325 MG PO TABS: 325.0000 mg | ORAL_TABLET | Freq: Four times a day (QID) | ORAL | Status: DC | PRN

## 2017-10-31 MED ORDER — GABAPENTIN 100 MG PO CAPS: 100.0000 mg | ORAL_CAPSULE | ORAL | Status: DC

## 2017-10-31 MED ORDER — FENTANYL CITRATE (PF) 100 MCG/2ML IJ SOLN
INTRAMUSCULAR | Status: AC
  Filled 2017-10-31: qty 2

## 2017-10-31 MED ORDER — OXYCODONE HCL 5 MG PO TABS
5.0000 mg | ORAL_TABLET | Freq: Once | ORAL | Status: AC | PRN
  Administered 2017-10-31: 5 mg via ORAL

## 2017-10-31 MED ORDER — CARVEDILOL 6.25 MG PO TABS
6.2500 mg | ORAL_TABLET | Freq: Two times a day (BID) | ORAL | Status: DC
  Administered 2017-10-31 – 2017-11-01 (×2): 6.25 mg via ORAL
  Filled 2017-10-31 (×2): qty 1

## 2017-10-31 MED ORDER — DOCUSATE SODIUM 100 MG PO CAPS
100.0000 mg | ORAL_CAPSULE | Freq: Two times a day (BID) | ORAL | Status: DC
  Administered 2017-10-31 – 2017-11-01 (×3): 100 mg via ORAL
  Filled 2017-10-31 (×3): qty 1

## 2017-10-31 MED ORDER — SCOPOLAMINE 1 MG/3DAYS TD PT72
MEDICATED_PATCH | TRANSDERMAL | Status: DC | PRN
  Administered 2017-10-31: 1 via TRANSDERMAL

## 2017-10-31 MED ORDER — ACETAMINOPHEN 10 MG/ML IV SOLN
INTRAVENOUS | Status: AC
  Filled 2017-10-31: qty 100

## 2017-10-31 MED ORDER — ROCURONIUM BROMIDE 10 MG/ML (PF) SYRINGE
PREFILLED_SYRINGE | INTRAVENOUS | Status: AC
  Filled 2017-10-31: qty 10

## 2017-10-31 MED ORDER — MIDAZOLAM HCL 2 MG/2ML IJ SOLN: 1.0000 mg | INTRAMUSCULAR | Status: DC | PRN

## 2017-10-31 MED ORDER — LIDOCAINE 2% (20 MG/ML) 5 ML SYRINGE
INTRAMUSCULAR | Status: DC | PRN
  Administered 2017-10-31: 100 mg via INTRAVENOUS

## 2017-10-31 MED ORDER — MIDAZOLAM HCL 5 MG/5ML IJ SOLN
INTRAMUSCULAR | Status: DC | PRN
  Administered 2017-10-31: 2 mg via INTRAVENOUS

## 2017-10-31 MED ORDER — POLYETHYLENE GLYCOL 3350 17 G PO PACK: 17.0000 g | PACK | Freq: Every day | ORAL | Status: DC | PRN

## 2017-10-31 MED ORDER — ONDANSETRON HCL 4 MG/2ML IJ SOLN
INTRAMUSCULAR | Status: DC | PRN
  Administered 2017-10-31: 4 mg via INTRAVENOUS

## 2017-10-31 MED ORDER — LACTATED RINGERS IV SOLN
INTRAVENOUS | Status: DC
  Administered 2017-10-31 – 2017-11-01 (×2): via INTRAVENOUS

## 2017-10-31 MED ORDER — FENTANYL CITRATE (PF) 100 MCG/2ML IJ SOLN
25.0000 ug | INTRAMUSCULAR | Status: DC | PRN
  Administered 2017-10-31 (×2): 25 ug via INTRAVENOUS
  Administered 2017-10-31: 50 ug via INTRAVENOUS

## 2017-10-31 MED ORDER — CEFAZOLIN SODIUM-DEXTROSE 2-4 GM/100ML-% IV SOLN
2.0000 g | INTRAVENOUS | Status: AC
  Administered 2017-10-31: 2 g via INTRAVENOUS

## 2017-10-31 MED ORDER — BUPIVACAINE HCL (PF) 0.5 % IJ SOLN
INTRAMUSCULAR | Status: DC | PRN
  Administered 2017-10-31: 10 mL via PERINEURAL

## 2017-10-31 MED ORDER — BRIMONIDINE TARTRATE 0.2 % OP SOLN
1.0000 [drp] | Freq: Two times a day (BID) | OPHTHALMIC | Status: DC
  Administered 2017-10-31 – 2017-11-01 (×2): 1 [drp] via OPHTHALMIC
  Filled 2017-10-31: qty 5

## 2017-10-31 MED ORDER — OXYCODONE HCL 5 MG PO TABS: 5.0000 mg | ORAL_TABLET | Freq: Four times a day (QID) | ORAL | 0 refills | Status: DC | PRN

## 2017-10-31 MED ORDER — GABAPENTIN 100 MG PO CAPS
100.0000 mg | ORAL_CAPSULE | Freq: Two times a day (BID) | ORAL | Status: DC
  Administered 2017-10-31 – 2017-11-01 (×3): 100 mg via ORAL
  Filled 2017-10-31 (×3): qty 1

## 2017-10-31 MED ORDER — BISACODYL 5 MG PO TBEC: 5.0000 mg | DELAYED_RELEASE_TABLET | Freq: Every day | ORAL | Status: DC | PRN

## 2017-10-31 MED ORDER — MENTHOL 3 MG MT LOZG
1.0000 | LOZENGE | OROMUCOSAL | Status: DC | PRN
  Filled 2017-10-31: qty 9

## 2017-10-31 MED ORDER — SODIUM CHLORIDE 0.9 % IV SOLN
INTRAVENOUS | Status: AC
  Filled 2017-10-31: qty 500000

## 2017-10-31 MED ORDER — ONDANSETRON HCL 4 MG/2ML IJ SOLN
INTRAMUSCULAR | Status: AC
  Filled 2017-10-31: qty 2

## 2017-10-31 MED ORDER — ACETAMINOPHEN 500 MG PO TABS
1000.0000 mg | ORAL_TABLET | Freq: Four times a day (QID) | ORAL | Status: AC
  Administered 2017-10-31 – 2017-11-01 (×4): 1000 mg via ORAL
  Filled 2017-10-31 (×4): qty 2

## 2017-10-31 MED ORDER — FENTANYL CITRATE (PF) 100 MCG/2ML IJ SOLN: 50.0000 ug | INTRAMUSCULAR | Status: DC | PRN

## 2017-10-31 MED ORDER — SODIUM CHLORIDE 0.9 % IV SOLN
INTRAVENOUS | Status: DC | PRN
  Administered 2017-10-31: 500 mL

## 2017-10-31 MED ORDER — PHENOL 1.4 % MT LIQD: 1.0000 | OROMUCOSAL | Status: DC | PRN

## 2017-10-31 MED ORDER — LACTATED RINGERS IV SOLN
INTRAVENOUS | Status: DC
  Administered 2017-10-31: 08:00:00 via INTRAVENOUS

## 2017-10-31 MED ORDER — ONDANSETRON HCL 4 MG/2ML IJ SOLN: 4.0000 mg | Freq: Once | INTRAMUSCULAR | Status: DC | PRN

## 2017-10-31 MED ORDER — NITROGLYCERIN 0.4 MG SL SUBL: 0.4000 mg | SUBLINGUAL_TABLET | SUBLINGUAL | Status: DC | PRN

## 2017-10-31 MED ORDER — ALUM & MAG HYDROXIDE-SIMETH 200-200-20 MG/5ML PO SUSP
30.0000 mL | Freq: Four times a day (QID) | ORAL | Status: DC | PRN
  Administered 2017-10-31: 30 mL via ORAL
  Filled 2017-10-31: qty 30

## 2017-10-31 MED ORDER — ACETAMINOPHEN 10 MG/ML IV SOLN
INTRAVENOUS | Status: DC | PRN
  Administered 2017-10-31: 1000 mg via INTRAVENOUS

## 2017-10-31 MED ORDER — BRIMONIDINE TARTRATE-TIMOLOL 0.2-0.5 % OP SOLN: 1.0000 [drp] | Freq: Two times a day (BID) | OPHTHALMIC | Status: DC

## 2017-10-31 MED ORDER — TIMOLOL MALEATE 0.5 % OP SOLN
1.0000 [drp] | Freq: Two times a day (BID) | OPHTHALMIC | Status: DC
  Administered 2017-10-31 – 2017-11-01 (×2): 1 [drp] via OPHTHALMIC
  Filled 2017-10-31: qty 5

## 2017-10-31 MED ORDER — OXYCODONE HCL 5 MG PO TABS
ORAL_TABLET | ORAL | Status: AC
  Filled 2017-10-31: qty 1

## 2017-10-31 MED ORDER — DEXAMETHASONE SODIUM PHOSPHATE 10 MG/ML IJ SOLN
INTRAMUSCULAR | Status: AC
  Filled 2017-10-31: qty 1

## 2017-10-31 MED ORDER — PROPOFOL 10 MG/ML IV BOLUS
INTRAVENOUS | Status: DC | PRN
  Administered 2017-10-31: 150 mg via INTRAVENOUS

## 2017-10-31 MED ORDER — LATANOPROST 0.005 % OP SOLN
1.0000 [drp] | Freq: Every day | OPHTHALMIC | Status: DC
  Administered 2017-10-31: 1 [drp] via OPHTHALMIC
  Filled 2017-10-31: qty 2.5

## 2017-10-31 MED ORDER — SCOPOLAMINE 1 MG/3DAYS TD PT72
MEDICATED_PATCH | TRANSDERMAL | Status: AC
  Filled 2017-10-31: qty 1

## 2017-10-31 MED ORDER — ZOLPIDEM TARTRATE 5 MG PO TABS
5.0000 mg | ORAL_TABLET | Freq: Every evening | ORAL | Status: DC | PRN
  Administered 2017-10-31: 5 mg via ORAL
  Filled 2017-10-31: qty 1

## 2017-10-31 MED ORDER — DEXAMETHASONE SODIUM PHOSPHATE 10 MG/ML IJ SOLN
INTRAMUSCULAR | Status: DC | PRN
  Administered 2017-10-31: 10 mg via INTRAVENOUS

## 2017-10-31 MED ORDER — BUPIVACAINE LIPOSOME 1.3 % IJ SUSP
INTRAMUSCULAR | Status: DC | PRN
  Administered 2017-10-31: 10 mL via PERINEURAL

## 2017-10-31 MED ORDER — FENTANYL CITRATE (PF) 100 MCG/2ML IJ SOLN
INTRAMUSCULAR | Status: AC
  Administered 2017-10-31: 25 ug via INTRAVENOUS
  Filled 2017-10-31: qty 2

## 2017-10-31 MED ORDER — OXYCODONE HCL 5 MG/5ML PO SOLN
5.0000 mg | Freq: Once | ORAL | Status: AC | PRN
  Filled 2017-10-31: qty 5

## 2017-10-31 MED ORDER — METOCLOPRAMIDE HCL 5 MG PO TABS: 5.0000 mg | ORAL_TABLET | Freq: Three times a day (TID) | ORAL | Status: DC | PRN

## 2017-10-31 MED ORDER — PREDNISOLONE ACETATE 1 % OP SUSP
1.0000 [drp] | Freq: Two times a day (BID) | OPHTHALMIC | Status: DC
  Administered 2017-10-31 – 2017-11-01 (×2): 1 [drp] via OPHTHALMIC
  Filled 2017-10-31: qty 5

## 2017-10-31 MED ORDER — FENTANYL CITRATE (PF) 100 MCG/2ML IJ SOLN
INTRAMUSCULAR | Status: DC | PRN
  Administered 2017-10-31: 100 ug via INTRAVENOUS

## 2017-10-31 MED ORDER — ONDANSETRON HCL 4 MG/2ML IJ SOLN: 4.0000 mg | Freq: Four times a day (QID) | INTRAMUSCULAR | Status: DC | PRN

## 2017-10-31 MED ORDER — OXYCODONE HCL 5 MG PO TABS
5.0000 mg | ORAL_TABLET | ORAL | Status: DC | PRN
  Administered 2017-10-31: 5 mg via ORAL
  Filled 2017-10-31 (×4): qty 2

## 2017-10-31 MED ORDER — PROPOFOL 10 MG/ML IV BOLUS
INTRAVENOUS | Status: AC
  Filled 2017-10-31: qty 20

## 2017-10-31 SURGICAL SUPPLY — 42 items
ATTRACTOMAT 16X20 MAGNETIC DRP (DRAPES) ×2 IMPLANT
BAG ZIPLOCK 12X15 (MISCELLANEOUS) IMPLANT
BLADE OSCILLATING/SAGITTAL (BLADE) ×1
BLADE SW THK.38XMED LNG THN (BLADE) ×1 IMPLANT
BUR OVAL CARBIDE 4.0 (BURR) ×2 IMPLANT
COVER SURGICAL LIGHT HANDLE (MISCELLANEOUS) ×2 IMPLANT
DERMASPAN .5-.9MM 4X4CM SHOU (Miscellaneous) ×2 IMPLANT
DRAPE POUCH INSTRU U-SHP 10X18 (DRAPES) ×2 IMPLANT
DRSG AQUACEL AG ADV 3.5X 6 (GAUZE/BANDAGES/DRESSINGS) ×2 IMPLANT
DURAPREP 26ML APPLICATOR (WOUND CARE) ×2 IMPLANT
ELECT BLADE TIP CTD 4 INCH (ELECTRODE) ×2 IMPLANT
ELECT REM PT RETURN 15FT ADLT (MISCELLANEOUS) ×2 IMPLANT
GLOVE BIOGEL PI IND STRL 6.5 (GLOVE) ×1 IMPLANT
GLOVE BIOGEL PI IND STRL 8.5 (GLOVE) ×1 IMPLANT
GLOVE BIOGEL PI INDICATOR 6.5 (GLOVE) ×1
GLOVE BIOGEL PI INDICATOR 8.5 (GLOVE) ×1
GLOVE SURG SS PI 6.5 STRL IVOR (GLOVE) ×2 IMPLANT
GOWN STRL REUS W/TWL LRG LVL3 (GOWN DISPOSABLE) ×2 IMPLANT
GOWN STRL REUS W/TWL XL LVL3 (GOWN DISPOSABLE) ×2 IMPLANT
HEMOSTAT SPONGE AVITENE ULTRA (HEMOSTASIS) ×2 IMPLANT
KIT BASIN OR (CUSTOM PROCEDURE TRAY) ×2 IMPLANT
KIT POSITION SHOULDER SCHLEI (MISCELLANEOUS) ×2 IMPLANT
MANIFOLD NEPTUNE II (INSTRUMENTS) ×2 IMPLANT
NEEDLE MA TROC 1/2 (NEEDLE) IMPLANT
NS IRRIG 1000ML POUR BTL (IV SOLUTION) IMPLANT
PACK SHOULDER (CUSTOM PROCEDURE TRAY) ×2 IMPLANT
POSITIONER SURGICAL ARM (MISCELLANEOUS) ×2 IMPLANT
SLING ARM IMMOBILIZER LRG (SOFTGOODS) ×2 IMPLANT
SPONGE LAP 4X18 RFD (DISPOSABLE) IMPLANT
STAPLER VISISTAT 35W (STAPLE) IMPLANT
STRIP CLOSURE SKIN 1/2X4 (GAUZE/BANDAGES/DRESSINGS) ×2 IMPLANT
SUCTION FRAZIER HANDLE 12FR (TUBING) ×1
SUCTION TUBE FRAZIER 12FR DISP (TUBING) ×1 IMPLANT
SUT BONE WAX W31G (SUTURE) ×2 IMPLANT
SUT ETHIBOND NAB CT1 #1 30IN (SUTURE) ×8 IMPLANT
SUT MNCRL AB 4-0 PS2 18 (SUTURE) ×2 IMPLANT
SUT VIC AB 1 CT1 27 (SUTURE) ×1
SUT VIC AB 1 CT1 27XBRD ANTBC (SUTURE) ×1 IMPLANT
SUT VIC AB 2-0 CT1 27 (SUTURE) ×1
SUT VIC AB 2-0 CT1 TAPERPNT 27 (SUTURE) ×1 IMPLANT
TOWEL OR 17X26 10 PK STRL BLUE (TOWEL DISPOSABLE) ×2 IMPLANT
TOWEL OR NON WOVEN STRL DISP B (DISPOSABLE) ×2 IMPLANT

## 2017-10-31 NOTE — Anesthesia Postprocedure Evaluation (Signed)
Anesthesia Post Note  Patient: Connie Owens  Procedure(s) Performed: Right rotator cuff repair with graft and anchor, acromioplasty (Right Shoulder)     Patient location during evaluation: PACU Anesthesia Type: General Level of consciousness: awake and alert Pain management: pain level controlled Vital Signs Assessment: post-procedure vital signs reviewed and stable Respiratory status: spontaneous breathing, nonlabored ventilation and respiratory function stable Cardiovascular status: blood pressure returned to baseline and stable Postop Assessment: no apparent nausea or vomiting Anesthetic complications: no    Last Vitals:  Vitals:   10/31/17 1030 10/31/17 1045  BP: (!) 155/80 (!) 142/81  Pulse: 61 (!) 59  Resp: 14 18  Temp:  36.4 C  SpO2: 100% 99%    Last Pain:  Vitals:   10/31/17 1045  TempSrc:   PainSc: [place]

## 2017-10-31 NOTE — Progress Notes (Signed)
Earlier in shift, around 1400 pt complained of pressure in chest when back in bed from ambulating to bathroom.  BP 177/94, HR 94. Some SOB, pain not radiating to arm or jaw.  PA-A Amber made aware. EKG showed 1st degree block.   On another trip to bathroom, pt became very SOB , tachycardic HR 112-114,  O2 sat dropped to 82-86% on RA.  Pt complained again of pressure in chest that got better when she rested back to bed.  Amber PA made aware again.  Orders to keep pt bedrest, cardiology consulted, and pt to be transferred to telemetry.  RN will monitor.

## 2017-10-31 NOTE — Op Note (Signed)
NAME: Connie Owens, FROMAN MEDICAL RECORD UO:[id number] ACCOUNT [id number] DATE OF BIRTH:September 10, 1945 FACILITY: [facility] LOCATION: [facility] PHYSICIAN:Racheal Mathurin Fransico Setters, MD  OPERATIVE REPORT  DATE OF PROCEDURE:  10/31/2017  SURGEON:  Latanya Maudlin, MD  ASSISTANT:  Ardeen Jourdain, PA  PREOPERATIVE DIAGNOSES: 1.  Severe impingement syndrome, right shoulder. 2.  Tear of the rotator cuff tendon, right shoulder.  POSTOPERATIVE DIAGNOSES:   1.  Severe impingement syndrome, right shoulder. 2.  Tear of the rotator cuff tendon, right shoulder.  OPERATION: 1.  Open acromionectomy and acromioplasty, right shoulder. 2.  Primary repair of the rotator cuff, right shoulder. 3.  Application of a dermis band graft, right shoulder.  DESCRIPTION OF PROCEDURE:  Under general anesthesia, routine orthopedic prep and draping of the right upper extremity was carried out.  The patient was in the beach-chair position.  The patient had 1 g of IV Ancef.  At this time, the appropriate timeout  was carried out.  I also marked the appropriate right shoulder in the holding area.  An incision then was made over the anterior aspect of the right shoulder after the prep and draping and timeout.  The bleeders were identified and cauterized.   Self-retaining retractors were inserted.  We went down directly onto the acromion.  She had severe impingement of the acromion into the rotator cuff.  We retracted the shoulder distally and then inserted a Bennett retractor after the deltoid tendon was  sharply dissected free.  At this particular point, we utilized the oscillating saw, did a partial open acromionectomy.  We then utilized the bur to even the undersurface out.  Once we reestablished the subacromial space, we thoroughly irrigated out the  area of bone wax on the undersurface of the acromion.  Following that, we then went on and addressed the bursa.  We did a bursectomy.  We then found a large hole in the rotator cuff, retracted  that somewhat, and then burred the undersurface over the  articular surface of the humerus, the lateral aspect.  Irrigated that out.  We created a nice open bleeding area.  Then we reapproximated the rotator cuff tendon.  Obviously, the tendon was quite thinned out in this area, so I applied a dermis band  graft.  We had a nice repair.  We thoroughly irrigated out the area.  We had good motion out of the shoulder.  There was no further impingement.  We then reapproximated the deltoid tendon and muscle in the usual fashion.  Sterile dressings were applied.   She was placed in a shoulder immobilizer.  Preoperatively in the holding area, she was given an interscalene nerve block to the right shoulder.  She will be kept overnight for pain control.  LN/NUANCE  D:10/31/2017 T:10/31/2017 JOB:[id number]

## 2017-10-31 NOTE — Anesthesia Procedure Notes (Signed)
Procedure Name: Intubation Date/Time: 10/31/2017 8:34 AM Performed by: Lind Covert, CRNA Pre-anesthesia Checklist: Patient identified, Emergency Drugs available, Suction available and Patient being monitored Patient Re-evaluated:Patient Re-evaluated prior to induction Oxygen Delivery Method: Circle system utilized Preoxygenation: Pre-oxygenation with 100% oxygen Induction Type: IV induction Ventilation: Mask ventilation without difficulty Laryngoscope Size: Mac and 3 Grade View: Grade I Tube type: Oral Tube size: 7.0 mm Number of attempts: 1 Airway Equipment and Method: Stylet Placement Confirmation: ETT inserted through vocal cords under direct vision,  positive ETCO2 and breath sounds checked- equal and bilateral Secured at: 22 cm Tube secured with: Tape Dental Injury: Teeth and Oropharynx as per pre-operative assessment

## 2017-10-31 NOTE — Brief Op Note (Signed)
10/31/2017  9:45 AM  PATIENT:  Connie Owens  72 y.o. female  PRE-OPERATIVE DIAGNOSIS:  right shoulder rotator cuff tear  POST-OPERATIVE DIAGNOSIS:  right shoulder rotator cuff tear  PROCEDURE:  Procedure(s): Right rotator cuff repair with graft and anchor, acromioplasty (Right)  SURGEON:  Surgeon(s) and Role:    Latanya Maudlin, MD - Primary  PHYSICIAN ASSISTANT:Amber Lake Norden PA   ASSISTANTS:Amber Cecilio Asper PA  ANESTHESIA:   general  EBL:  30cc   BLOOD ADMINISTERED:none  DRAINS: none   LOCAL MEDICATIONS USED:  OTHER Nerve Block Right Shoulder by Anesthesiology.  SPECIMEN:  No Specimen and Fine Needle Aspirate  DISPOSITION OF SPECIMEN:  N/A  COUNTS:  YES  TOURNIQUET:  * No tourniquets in log *  DICTATION: .Other Dictation: Dictation Number [id number]  PLAN OF CARE: Admit for overnight observation  PATIENT DISPOSITION:  PACU - hemodynamically stable.   Delay start of Pharmacological VTE agent (>24hrs) due to surgical blood loss or risk of bleeding: yes

## 2017-10-31 NOTE — Discharge Instructions (Signed)
Keep your sling on at all times, including sleeping in your sling. You may shower with the dressing on. It is waterproof. If the dressing becomes compromised, remove it and replace with gauze and paper tape.  No driving until further notice. No lifting or overhead movements.  Call Dr. Gladstone Lighter if any wound complications or temperature of 101 degrees F or over.  Call the office for an appointment to see Dr. Gladstone Lighter in two weeks: [phone] and ask for Dr. Charlestine Night nurse, Brunilda Payor.

## 2017-10-31 NOTE — H&P (Signed)
Connie Owens is an 72 y.o. female.   Chief Complaint: Pain in Righ shoulder. HPI: Progressive loss of abuction of her Right Shoulder   Past Medical History:  Diagnosis Date  . Anxiety state    NOS  . Arthritis    osteo...right knee  . CAD (coronary artery disease)    NSTEMI 8/13 => LHC 09/27/11: oLM 50%, dLM 30%, oLAD 60-70%, pLAD 50%, mLAD 70%, pD1 30%, pD2 40%, oRCA 25%, mRCA 40%, EF 50% with ant HK => LAD not amenable to PCI => med Rx  unless recurrent angina = > consider CABG  . Carpal tunnel syndrome   . Complication of anesthesia   . Corneal disorder   . Diverticular disease   . Dysmetabolic syndrome X   . GERD (gastroesophageal reflux disease)   . 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   [name] Headache, paroxysmal hemicrania, chronic 07/23/2012    Referral to BOTOX evaluation with dr Rexene Alberts or Krista Blue .  [name] Heart attack ([place]) 09/2011  . Hernia    hiatal  . HLD (hyperlipidemia)   . Hx of adenomatous colonic polyps   . Hypertension    essen. NOS  . Ischemic cardiomyopathy    a.  Echo 09/29/11: EF 30-35% with inferior, posterior, lateral AK, anterior severe HK, mild LVH, mild MR, PASP 40;  => b. follow up echo 10/13:  EF 55-60%, Gr 1 diast dysfn, mild MR  . Migraine headache without aura 07/23/2012    Left sided  Lamonte Sakai thought to be migrainous  , hypersensitivity to touch , photophobia ,  no nausea.   [name] PONV (postoperative nausea and vomiting)   . Shoulder fracture   . Sleep apnea    CPAP    Past Surgical History:  Procedure Laterality Date  . ABDOMINAL HYSTERECTOMY    . ABDOMINOPLASTY  10/23/2016  . ankle cystectomy Right   . APPENDECTOMY    . BRACHIOPLASTY Bilateral 05/02/2016  . BREAST EXCISIONAL BIOPSY Right 02/15/1994  . BREAST LUMPECTOMY Right 1996  . BUNIONECTOMY Right   . CARDIAC CATHETERIZATION  09/28/2011   Dr Acie Fredrickson(  to see Dr Ron Parker)  . CHOLECYSTECTOMY    . COLONOSCOPY W/ POLYPECTOMY    .  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 CORNEAL TRANSPLANT Left    x1  . HEMORRHOID SURGERY  12/01/2010, 2014   [place] hemorrhoid ligation/pexy  . LEFT HEART CATHETERIZATION WITH CORONARY ANGIOGRAM N/A 09/28/2011   Procedure: LEFT HEART CATHETERIZATION WITH CORONARY ANGIOGRAM;  Surgeon: Minus Breeding, MD;  Location: [facility] CATH LAB;  Service: Cardiovascular;  Laterality: N/A;  . lumbar spur removed    . rectocele surgery    . TOTAL KNEE ARTHROPLASTY Right     Family History  Problem Relation Age of Onset  . Breast cancer Mother   . Heart disease Father   . Stroke Father   . Dementia Father   . Prostate cancer Father   . Diabetes Sister        3 sisters   . Heart disease Sister        1 sister CABG  . Breast cancer Sister        PTE pre & post Dx of ca  . Diabetes Paternal Grandfather   . Colon cancer Neg Hx    Social History:  reports that she has never smoked. She has never used smokeless tobacco.  She reports that she drinks alcohol. She reports that she does not use drugs.  Allergies:  Allergies  Allergen Reactions  . Iohexol Rash    rash  . Iodinated Diagnostic Agents Other (See Comments) and Rash    Other Reaction: Other reaction  . Statins Other (See Comments)    Cause muscle aches/pains  . Imdur [Isosorbide Nitrate]     Severe headaches  . Latex Rash  . Tape Rash and Other (See Comments)    Plastic tap-blisters  . Verapamil Other (See Comments)    dizziness    Medications Prior to Admission  Medication Sig Dispense Refill  . aspirin 81 MG tablet Take 81 mg by mouth daily.    [name] azelastine (ASTELIN) 0.1 % nasal spray Place 2 sprays into both nostrils 2 (two) times daily. Use in each nostril as directed (Patient taking differently: Place 2 sprays into both nostrils 2 (two) times daily as needed (allergies.). Use in each nostril as directed) 30 mL 12  . brimonidine-timolol (COMBIGAN) 0.2-0.5 % ophthalmic solution Place 1  drop into the left eye 2 (two) times daily. Place 1 drop into left eye twice daily (morning & evening)    . carvedilol (COREG) 6.25 MG tablet Take 1 tablet (6.25 mg total) by mouth 2 (two) times daily. 180 tablet 3  . clopidogrel (PLAVIX) 75 MG tablet Take 1 tablet (75 mg total) by mouth daily. (Patient not taking: Reported on 10/25/2017) 90 tablet 3  . estradiol (ESTRACE) 1 MG tablet Take 1 mg by mouth daily.     [name] gabapentin (NEURONTIN) 100 MG capsule 2 po tid for pain (Patient taking differently: Take 100-200 mg by mouth See admin instructions. Take 1 capsule in the morning, 1 capsule at mid day, & 2 capsules at bedtime) 540 capsule 1  . latanoprost (XALATAN) 0.005 % ophthalmic solution Place 1 drop into both eyes at bedtime.     [name] losartan (COZAAR) 50 MG tablet Take 1 tablet (50 mg total) by mouth daily. Needs appt. (Patient taking differently: Take 50 mg by mouth every evening. ) 90 tablet 3  . Multiple Vitamins-Minerals (ALIVE ONCE DAILY WOMENS 50+) TABS Take 1 tablet by mouth daily.    . nortriptyline (PAMELOR) 10 MG capsule Take 5 capsules (50 mg total) by mouth at bedtime. (Patient taking differently: Take 40-50 mg by mouth at bedtime. ) 450 capsule 3  . prednisoLONE acetate (PRED FORTE) 1 % ophthalmic suspension Place 1 drop into both eyes 2 (two) times daily.    [name] tiZANidine (ZANAFLEX) 4 MG tablet Take 4 mg by mouth every 8 (eight) hours as needed for muscle spasms.    [name] zolpidem (AMBIEN CR) 12.5 MG CR tablet Take 1 tablet (12.5 mg total) by mouth at bedtime as needed for sleep. (Patient taking differently: Take 12.5 mg by mouth at bedtime. ) 90 tablet 1  . clobetasol ointment (TEMOVATE) 2.20 % Apply 1 application topically 2 (two) times daily. (Patient taking differently: Apply 1 application topically 2 (two) times daily as needed (psoriasis). ) 90 g 1  . nitroGLYCERIN (NITROSTAT) 0.4 MG SL tablet Place 1 tablet (0.4 mg total) under the tongue every 5 (five) minutes x 3 doses as needed for  chest pain. 50 tablet 1    No results found for this or any previous visit (from the past 48 hour(s)). No results found.  Review of Systems  Constitutional: Negative.   HENT: Negative.   Eyes: Negative.   Cardiovascular: Negative.   Gastrointestinal: Negative.  Genitourinary: Negative.   Musculoskeletal: Positive for joint pain.  Skin: Negative.   Neurological: Negative.   Endo/Heme/Allergies: Negative.   Psychiatric/Behavioral: Negative.     Blood pressure (!) 110/93, pulse 66, temperature 99.6 F (37.6 C), temperature source Oral, resp. rate 16, height 5' 3.5" (1.613 m), weight 90.3 kg, SpO2 96 %. Physical Exam  Constitutional: She appears well-developed.  Eyes: Pupils are equal, round, and reactive to light.  Neck: Normal range of motion.  Cardiovascular: Normal rate.  Respiratory: Effort normal.  Musculoskeletal: She exhibits tenderness.  Weakness of her Shoulder abduction on the right  Neurological: She is alert.  Skin: Skin is warm.     Assessment/Plan Open Repair of her right Rotator Cuff and possible graft.  Latanya Maudlin, MD 10/31/2017, 8:13 AM

## 2017-10-31 NOTE — Anesthesia Procedure Notes (Signed)
Anesthesia Regional Block: Interscalene brachial plexus block   Pre-Anesthetic Checklist: ,, timeout performed, Correct Patient, Correct Site, Correct Laterality, Correct Procedure, Correct Position, site marked, Risks and benefits discussed,  Surgical consent,  Pre-op evaluation,  At surgeon's request and post-op pain management  Laterality: Right  Prep: chloraprep       Needles:  Injection technique: Single-shot  Needle Type: Echogenic Needle     Needle Length: 5cm  Needle Gauge: 21     Additional Needles:   Narrative:  Start time: 10/31/2017 8:14 AM End time: 10/31/2017 8:18 AM Injection made incrementally with aspirations every 5 mL.  Performed by: Personally  Anesthesiologist: Audry Pili, MD  Additional Notes: No pain on injection. No increased resistance to injection. Injection made in 5cc increments. Good needle visualization. Patient tolerated the procedure well.

## 2017-10-31 NOTE — Transfer of Care (Signed)
Immediate Anesthesia Transfer of Care Note  Patient: Connie Owens  Procedure(s) Performed: Right rotator cuff repair with graft and anchor, acromioplasty (Right Shoulder)  Patient Location: PACU  Anesthesia Type:General and Regional  Level of Consciousness: sedated  Airway & Oxygen Therapy: Patient Spontanous Breathing and Patient connected to face mask oxygen  Post-op Assessment: Report given to RN and Post -op Vital signs reviewed and stable  Post vital signs: Reviewed and stable  Last Vitals:  Vitals Value Taken Time  BP    Temp    Pulse 70 10/31/2017  9:47 AM  Resp 14 10/31/2017  9:47 AM  SpO2 97 % 10/31/2017  9:47 AM  Vitals shown include unvalidated device data.  Last Pain:  Vitals:   10/31/17 0654  TempSrc: Oral      Patients Stated Pain Goal: 4 (50/56/97 9480)  Complications: No apparent anesthesia complications

## 2017-11-01 ENCOUNTER — Encounter (HOSPITAL_COMMUNITY): Payer: Self-pay | Admitting: Orthopedic Surgery

## 2017-11-01 ENCOUNTER — Observation Stay (HOSPITAL_COMMUNITY): Payer: Medicare Other

## 2017-11-01 DIAGNOSIS — Z7982 Long term (current) use of aspirin: Secondary | ICD-10-CM | POA: Diagnosis not present

## 2017-11-01 DIAGNOSIS — R079 Chest pain, unspecified: Secondary | ICD-10-CM | POA: Diagnosis not present

## 2017-11-01 DIAGNOSIS — M7541 Impingement syndrome of right shoulder: Secondary | ICD-10-CM | POA: Diagnosis not present

## 2017-11-01 DIAGNOSIS — I25811 Atherosclerosis of native coronary artery of transplanted heart without angina pectoris: Secondary | ICD-10-CM | POA: Diagnosis not present

## 2017-11-01 DIAGNOSIS — G4730 Sleep apnea, unspecified: Secondary | ICD-10-CM | POA: Diagnosis not present

## 2017-11-01 DIAGNOSIS — R0600 Dyspnea, unspecified: Secondary | ICD-10-CM | POA: Diagnosis not present

## 2017-11-01 DIAGNOSIS — M75101 Unspecified rotator cuff tear or rupture of right shoulder, not specified as traumatic: Secondary | ICD-10-CM | POA: Diagnosis not present

## 2017-11-01 DIAGNOSIS — Z79890 Hormone replacement therapy (postmenopausal): Secondary | ICD-10-CM | POA: Diagnosis not present

## 2017-11-01 DIAGNOSIS — I10 Essential (primary) hypertension: Secondary | ICD-10-CM | POA: Diagnosis not present

## 2017-11-01 DIAGNOSIS — I252 Old myocardial infarction: Secondary | ICD-10-CM | POA: Diagnosis not present

## 2017-11-01 DIAGNOSIS — I2510 Atherosclerotic heart disease of native coronary artery without angina pectoris: Secondary | ICD-10-CM | POA: Diagnosis not present

## 2017-11-01 DIAGNOSIS — I208 Other forms of angina pectoris: Secondary | ICD-10-CM | POA: Diagnosis not present

## 2017-11-01 DIAGNOSIS — Z79899 Other long term (current) drug therapy: Secondary | ICD-10-CM | POA: Diagnosis not present

## 2017-11-01 DIAGNOSIS — Z96651 Presence of right artificial knee joint: Secondary | ICD-10-CM | POA: Diagnosis not present

## 2017-11-01 DIAGNOSIS — R0602 Shortness of breath: Secondary | ICD-10-CM | POA: Diagnosis not present

## 2017-11-01 DIAGNOSIS — Z9989 Dependence on other enabling machines and devices: Secondary | ICD-10-CM | POA: Diagnosis not present

## 2017-11-01 LAB — TROPONIN I

## 2017-11-01 NOTE — Progress Notes (Signed)
Pt discharge instructions were reviewed with the pt. Currently awaiting for son to pick her up. No questions or concerns at this time.

## 2017-11-01 NOTE — Evaluation (Signed)
Occupational Therapy Evaluation Patient Details Name: Connie Owens MRN: [id number] DOB: 02-28-1945 Today's Date: 11/01/2017    History of Present Illness Right rotator cuff repair with graft and anchor, acromioplasty (Right)   Clinical Impression   Patient is s/p R RTC repair resulting in functional limitations due to the deficits listed below (see OT Problem List).  Patient will benefit from skilled OT to increase their safety and independence with ADL and functional mobility for ADL to allow facilitate discharge to venue listed below.     Follow Up Recommendations  Follow surgeon's recommendation for DC plan and follow-up therapies          Precautions / Restrictions Precautions Precautions: Shoulder Shoulder Interventions: Shoulder sling/immobilizer;At all times Precaution Comments: elbow, wrist and hand ROM OK Required Braces or Orthoses: Sling Restrictions Weight Bearing Restrictions: Yes      Mobility Bed Mobility             mod I      Transfers        mod I                                   Pertinent Vitals/Pain Pain Assessment: 0-10 Pain Score: 3  Pain Descriptors / Indicators: Aching;Sore;Discomfort Pain Intervention(s): Limited activity within patient's tolerance;Repositioned;Monitored during session     Hand Dominance     Extremity/Trunk Assessment Upper Extremity Assessment Upper Extremity Assessment: RUE deficits/detail RUE Deficits / Details: s/p shoulder sx           Communication Communication Communication: No difficulties   Cognition Arousal/Alertness: Awake/alert Behavior During Therapy: WFL for tasks assessed/performed Overall Cognitive Status: Within Functional Limits for tasks assessed                                           Shoulder Instructions Shoulder Instructions Donning/doffing shirt without moving shoulder: Minimal assistance;Patient able to independently direct caregiver Method  for sponge bathing under operated UE: Minimal assistance;Patient able to independently direct caregiver Donning/doffing sling/immobilizer: Minimal assistance;Patient able to independently direct caregiver Correct positioning of sling/immobilizer: Minimal assistance;Patient able to independently direct caregiver ROM for elbow, wrist and digits of operated UE: Minimal assistance;Patient able to independently direct caregiver Sling wearing schedule (on at all times/off for ADL's): Independent Proper positioning of operated UE when showering: Minimal assistance;Patient able to independently direct caregiver Positioning of UE while sleeping: Minimal assistance;Patient able to independently direct caregiver    Home Living Family/patient expects to be discharged to:: Private residence Living Arrangements: Alone;Children Available Help at Discharge: Family;Available 24 hours/day Type of Home: House       Home Layout: One level     Bathroom Shower/Tub: Teacher, early years/pre: Standard     Home Equipment: None          Prior Functioning/Environment Level of Independence: Independent                          OT Goals(Current goals can be found in the care plan section) Acute Rehab OT Goals Patient Stated Goal: home today OT Goal Formulation: With patient  OT Frequency:      AM-PAC PT "6 Clicks" Daily Activity     Outcome Measure Help from another person eating meals?: A Little Help from another person taking  care of personal grooming?: A Little Help from another person toileting, which includes using toliet, bedpan, or urinal?: A Little Help from another person bathing (including washing, rinsing, drying)?: A Little Help from another person to put on and taking off regular upper body clothing?: A Little Help from another person to put on and taking off regular lower body clothing?: A Little 6 Click Score: 18   End of Session Nurse Communication: Mobility  status  Activity Tolerance: Patient tolerated treatment well Patient left: in chair                   Time: 0940-7680 OT Time Calculation (min): 22 min Charges:  OT General Charges $OT Visit: 1 Visit OT Evaluation $OT Eval Low Complexity: 1 Low  Kari Baars, OT Acute Rehabilitation Services [phone] Office- [phone]     Gauri Galvao, Edwena Felty D 11/01/2017, 10:36 AM

## 2017-11-01 NOTE — Progress Notes (Signed)
Subjective: 1 Day Post-Op Procedure(s) (LRB): Right rotator cuff repair with graft and anchor, acromioplasty (Right) Patient reports pain as 1 on 0-10 scale.  Chest Pain last night so we transferred her to a monitor bed and she had no cardiac issues since. This morning she is pain free but cardiology will evaluate. I will DC her pending Cardiology evaluation.  Objective: Vital signs in last 24 hours: Temp:  [97.4 F (36.3 C)-99 F (37.2 C)] 97.8 F (36.6 C) (09/19 0405) Pulse Rate:  [59-102] 66 (09/19 0405) Resp:  [12-24] 12 (09/19 0405) BP: (123-177)/(74-114) 136/84 (09/19 0422) SpO2:  [87 %-100 %] 95 % (09/19 0405) Weight:  [90.3 kg] 90.3 kg (09/18 0751)  Intake/Output from previous day: 09/18 0701 - 09/19 0700 In: 2193.1 [P.O.:360; I.V.:1683.1; IV Piggyback:149.9] Out: 2900 [Urine:2900] Intake/Output this shift: No intake/output data recorded.  No results for input(s): HGB in the last 72 hours. No results for input(s): WBC, RBC, HCT, PLT in the last 72 hours. No results for input(s): NA, K, CL, CO2, BUN, CREATININE, GLUCOSE, CALCIUM in the last 72 hours. No results for input(s): LABPT, INR in the last 72 hours.  No cellulitis present  She is fine.  Assessment/Plan: 1 Day Post-Op Procedure(s) (LRB): Right rotator cuff repair with graft and anchor, acromioplasty (Right) DC after cardiology eval.    Connie Owens 11/01/2017, 7:19 AM

## 2017-11-01 NOTE — Consult Note (Signed)
Cardiology Consultation:   Patient ID: Connie Owens MRN: [id number]; DOB: 08/26/1945  Admit date: 10/31/2017 Date of Consult: 11/01/2017  Primary Care Provider: Carollee Owens, Connie Apa, DO Primary Cardiologist: Minus Breeding, MD   Primary Electrophysiologist:  None     Patient Profile:   Connie Owens is a 72 y.o. female with a hx of CAD who is being seen today for the evaluation of chest pain and dyspnea  at the request of Connie Owens.  History of Present Illness:   Connie Owens 72 y.o. admitted by ortho for right rotator cuff surgery done yesterday Post op 2:00 or so had some pressure in chest with elevated BP and HR Some dyspnea ECG with no acute changes Then latter in afternoon went to bathroom and had dyspnea with low sats Transferred to telemetry. No nitro given This am feels fine Eating breakfast no chest pain or dyspnea History of previous DCM but most recent echo in January with EF 60-65% and normal ETT 2018.  Cath in 2013 with moderate CAD Rx medically since   Past Medical History:  Diagnosis Date  . Anxiety state    NOS  . Arthritis    osteo...right knee  . CAD (coronary artery disease)    NSTEMI 8/13 => LHC 09/27/11: oLM 50%, dLM 30%, oLAD 60-70%, pLAD 50%, mLAD 70%, pD1 30%, pD2 40%, oRCA 25%, mRCA 40%, EF 50% with ant HK => LAD not amenable to PCI => med Rx  unless recurrent angina = > consider CABG  . Carpal tunnel syndrome   . Complication of anesthesia   . Corneal disorder   . Diverticular disease   . Dysmetabolic syndrome X   . GERD (gastroesophageal reflux disease)   . 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   [name] Headache, paroxysmal hemicrania, chronic 07/23/2012    Referral to BOTOX evaluation with dr Rexene Alberts or Krista Blue .  [name] Heart attack ([place]) 09/2011  . Hernia    hiatal  . HLD (hyperlipidemia)   . Hx of adenomatous colonic polyps   . Hypertension    essen. NOS  . Ischemic cardiomyopathy    a.   Echo 09/29/11: EF 30-35% with inferior, posterior, lateral AK, anterior severe HK, mild LVH, mild MR, PASP 40;  => b. follow up echo 10/13:  EF 55-60%, Gr 1 diast dysfn, mild MR  . Migraine headache without aura 07/23/2012    Left sided  Lamonte Sakai thought to be migrainous  , hypersensitivity to touch , photophobia ,  no nausea.   [name] PONV (postoperative nausea and vomiting)   . Shoulder fracture   . Sleep apnea    CPAP    Past Surgical History:  Procedure Laterality Date  . ABDOMINAL HYSTERECTOMY    . ABDOMINOPLASTY  10/23/2016  . ankle cystectomy Right   . APPENDECTOMY    . BRACHIOPLASTY Bilateral 05/02/2016  . BREAST EXCISIONAL BIOPSY Right 02/15/1994  . BREAST LUMPECTOMY Right 1996  . BUNIONECTOMY Right   . CARDIAC CATHETERIZATION  09/28/2011   Dr Acie Fredrickson(  to see Dr Ron Parker)  . CHOLECYSTECTOMY    . COLONOSCOPY W/ POLYPECTOMY    . 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 CORNEAL TRANSPLANT Left    x1  . HEMORRHOID SURGERY  12/01/2010, 2014   [place]man hemorrhoid ligation/pexy  . LEFT HEART CATHETERIZATION WITH CORONARY ANGIOGRAM N/A 09/28/2011   Procedure: LEFT HEART CATHETERIZATION  WITH CORONARY ANGIOGRAM;  Surgeon: Minus Breeding, MD;  Location[facility] C[facility] LAB;  Service: Cardiovascular;  Laterality: N/A;  . lumbar spur removed    . rectocele surgery    . TOTAL KNEE ARTHROPLASTY Right      Home Medications:  Prior to Admission medications   Medication Sig Start Date End Date Taking? Authorizing Provider  aspirin 81 MG tablet Take 81 mg by mouth daily.   Yes [provider]  azelastine (ASTELIN) 0.1 % nasal spray Place 2 sprays into both nostrils 2 (two) times daily. Use in each nostril as directed Patient taking differently: Place 2 sprays into both nostrils 2 (two) times daily as needed (allergies.). Use in each nostril as directed 05/10/17  Yes Saguier, Percell Miller, PA-C  brimonidine-timolol (COMBIGAN) 0.2-0.5 % ophthalmic solution Place 1 drop  into the left eye 2 (two) times daily. Place 1 drop into left eye twice daily (morning & evening)   Yes [provider]  carvedilol (COREG) 6.25 MG tablet Take 1 tablet (6.25 mg total) by mouth 2 (two) times daily. 07/11/17 07/11/18 Yes Minus Breeding, MD  clopidogrel (PLAVIX) 75 MG tablet Take 1 tablet (75 mg total) by mouth daily. Patient not taking: Reported on 10/25/2017 07/11/17  Yes Hochrein, Jeneen Rinks, MD  estradiol (ESTRACE) 1 MG tablet Take 1 mg by mouth daily.    Yes [provider]  gabapentin (NEURONTIN) 100 MG capsule 2 po tid for pain Patient taking differently: Take 100-200 mg by mouth See admin instructions. Take 1 capsule in the morning, 1 capsule at mid day, & 2 capsules at bedtime 04/10/17  Yes Lowne Chase, Yvonne R, DO  latanoprost (XALATAN) 0.005 % ophthalmic solution Place 1 drop into both eyes at bedtime.    Yes [provider]  losartan (COZAAR) 50 MG tablet Take 1 tablet (50 mg total) by mouth daily. Needs appt. Patient taking differently: Take 50 mg by mouth every evening.  07/11/17 07/11/18 Yes Minus Breeding, MD  Multiple Vitamins-Minerals (ALIVE ONCE DAILY WOMENS 50+) TABS Take 1 tablet by mouth daily.   Yes [provider]  nortriptyline (PAMELOR) 10 MG capsule Take 5 capsules (50 mg total) by mouth at bedtime. Patient taking differently: Take 40-50 mg by mouth at bedtime.  10/11/17  Yes Ward Givens, NP  prednisoLONE acetate (PRED FORTE) 1 % ophthalmic suspension Place 1 drop into both eyes 2 (two) times daily.   Yes [provider]  tiZANidine (ZANAFLEX) 4 MG tablet Take 4 mg by mouth every 8 (eight) hours as needed for muscle spasms.   Yes [provider]  zolpidem (AMBIEN CR) 12.5 MG CR tablet Take 1 tablet (12.5 mg total) by mouth at bedtime as needed for sleep. Patient taking differently: Take 12.5 mg by mouth at bedtime.  10/11/17  Yes Ward Givens, NP  clobetasol ointment (TEMOVATE) 7.82 % Apply 1 application  topically 2 (two) times daily. Patient taking differently: Apply 1 application topically 2 (two) times daily as needed (psoriasis).  04/10/17   Ann Held, DO  nitroGLYCERIN (NITROSTAT) 0.4 MG SL tablet Place 1 tablet (0.4 mg total) under the tongue every 5 (five) minutes x 3 doses as needed for chest pain. 02/15/17 08/10/18  Lendon Colonel, NP  oxyCODONE (OXY IR/ROXICODONE) 5 MG immediate release tablet Take 1-2 tablets (5-10 mg total) by mouth every 6 (six) hours as needed for moderate pain (pain score 4-6). 10/31/17   Ardeen Jourdain, PA-C    Inpatient Medications: Scheduled Meds: . acetaminophen  1,000  mg Oral Q6H  . brimonidine  1 drop Left Eye BID   And  . timolol  1 drop Left Eye BID  . carvedilol  6.25 mg Oral BID  . docusate sodium  100 mg Oral BID  . estradiol  1 mg Oral Daily  . gabapentin  100 mg Oral BID WC   And  . gabapentin  200 mg Oral QHS  . latanoprost  1 drop Both Eyes QHS  . losartan  50 mg Oral QPM  . nortriptyline  50 mg Oral QHS  . prednisoLONE acetate  1 drop Both Eyes BID   Continuous Infusions: . lactated ringers 75 mL/hr at 11/01/17 0256   PRN Meds: acetaminophen, alum & mag hydroxide-simeth, azelastine, bisacodyl, HYDROmorphone (DILAUDID) injection, menthol-cetylpyridinium **OR** phenol, metoCLOPramide **OR** metoCLOPramide (REGLAN) injection, nitroGLYCERIN, ondansetron **OR** ondansetron (ZOFRAN) IV, oxyCODONE, oxyCODONE, polyethylene glycol, zolpidem  Allergies:    Allergies  Allergen Reactions  . Iohexol Rash    rash  . Iodinated Diagnostic Agents Other (See Comments) and Rash    Other Reaction: Other reaction  . Statins Other (See Comments)    Cause muscle aches/pains  . Imdur [Isosorbide Nitrate]     Severe headaches  . Latex Rash  . Tape Rash and Other (See Comments)    Plastic tap-blisters  . Verapamil Other (See Comments)    dizziness    Social History:   Social History   Socioeconomic History  . Marital status:  Widowed    Spouse name: Ruthann Cancer  . Number of children: 3  . Years of education: 37  . Highest education level: Not on file  Occupational History  . 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 Financial resource strain: Not on file  . Food insecurity:    Worry: Not on file    Inability: Not on file  . Transportation needs:    Medical: Not on file    Non-medical: Not on file  Tobacco Use  . Smoking status: Never Smoker  . Smokeless tobacco: Never Used  Substance and Sexual Activity  . Alcohol use: Yes    Comment: rare  . Drug use: No  . Sexual activity: Not on file  Lifestyle  . Physical activity:    Days per week: Not on file    Minutes per session: Not on file  . Stress: Not on file  Relationships  . Social connections:    Talks on phone: Not on file    Gets together: Not on file    Attends religious service: Not on file    Active member of club or organization: Not on file    Attends meetings of clubs or organizations: Not on file    Relationship status: Not on file  . Intimate partner violence:    Fear of current or ex partner: Not on file    Emotionally abused: Not on file    Physically abused: Not on file    Forced sexual activity: Not on file  Other Topics Concern  . Not on file  Social History Narrative   Patient is widowed,([place] past 10/02/13) and lives at home with her husband.   Patient has three adult children.   Patient has a college education.   Patient is right-handed.   Patient drinks two cups of coffee daily.   Regular exercise   Patient is retired.    Family History:    Family History  Problem Relation Age of  Onset  . Breast cancer Mother   . Heart disease Father   . Stroke Father   . Dementia Father   . Prostate cancer Father   . Diabetes Sister        3 sisters   . Heart disease Sister        1 sister CABG  . Breast cancer Sister        PTE pre & post Dx of ca  . Diabetes Paternal  Grandfather   . Colon cancer Neg Hx      ROS:  Please see the history of present illness.   All other ROS reviewed and negative.     Physical Exam/Data:   Vitals:   11/01/17 0002 11/01/17 0050 11/01/17 0405 11/01/17 0422  BP: 140/77  (!) 128/114 136/84  Pulse: 85 72 66   Resp: 18 18 12    Temp: 97.8 F (36.6 C)  97.8 F (36.6 C)   TempSrc: Oral  Oral   SpO2: (!) 87% 92% 95%   Weight:      Height:        Intake/Output Summary (Last 24 hours) at 11/01/2017 0809 Last data filed at 11/01/2017 0600 Gross per 24 hour  Intake 2193.06 ml  Output 2900 ml  Net -706.94 ml   Filed Weights   10/31/17 0751  Weight: 90.3 kg   Body mass index is 34.7 kg/m.  General:  Well nourished, well developed, in no acute distress  HEENT: normal Lymph: no adenopathy Neck: no JVD Endocrine:  No thryomegaly Vascular: No carotid bruits; FA pulses 2+ bilaterally without bruits  Cardiac:  normal S1, S2; RRR; no murmur   Lungs:  clear to auscultation bilaterally, no wheezing, rhonchi or rales  Abd: soft, nontender, no hepatomegaly  Ext: no edema Musculoskeletal:  Post right rotator cuff surgery dressing dry  Skin: warm and dry  Neuro:  CNs 2-12 intact, no focal abnormalities noted Psych:  Normal affect   EKG:  The EKG was personally reviewed and demonstrates:  NSR non specific ST changes nothing acute  Telemetry:  Telemetry was personally reviewed and demonstrates:  NSR   Relevant CV Studies: None   Laboratory Data:  Chemistry Recent Labs  Lab 10/25/17 1436  NA 137  K 4.6  CL 102  CO2 27  GLUCOSE 93  BUN 12  CREATININE 0.63  CALCIUM 9.6  GFRNONAA >60  GFRAA >60  ANIONGAP 8    Recent Labs  Lab 10/25/17 1436  PROT 7.4  ALBUMIN 3.9  AST 19  ALT 15  ALKPHOS 69  BILITOT 0.4   Hematology Recent Labs  Lab 10/25/17 1436  WBC 9.1  RBC 4.38  HGB 12.3  HCT 38.4  MCV 87.7  MCH 28.1  MCHC 32.0  RDW 13.5  PLT 361   Cardiac EnzymesNo results for input(s): TROPONINI in  the last 168 hours. No results for input(s): TROPIPOC in the last 168 hours.  BNPNo results for input(s): BNP, PROBNP in the last 168 hours.  DDimer No results for input(s): DDIMER in the last 168 hours.  Radiology/Studies:  No results found.  Assessment and Plan:   1. Chest Pain:  Resolved ECG yesterday non acute Check troponin and ECG this am continue medical Rx resume Plavix. If she ambulates this am with no issues can d/c Will arrange outpatient f/u with [facility] and likely assess with myovue 2. Dyspnea:  Lungs clear this am check CXR stats normal no signs of CHF 3. Ortho:  No issues with  surgery Sling/imobilizer 4-6 weeks should be ok to resume plavix tonight   [facility] will sign off.   Medication Recommendations:  Same home Has SL nitro  Other recommendations (labs, testing, etc):  CXR Troponin ECG  Follow up as an outpatient:  Outpatient f/u Hochrein   For questions or updates, please contact [place] Please consult [email or web address] for contact info under     Signed, Jenkins Rouge, MD  11/01/2017 8:09 AM

## 2017-11-02 ENCOUNTER — Telehealth: Payer: Self-pay

## 2017-11-02 NOTE — Telephone Encounter (Signed)
Called patient to schedule hospital follow up visit. Left message for return call.

## 2017-11-02 NOTE — Discharge Summary (Signed)
Physician Discharge Summary   Patient ID: Connie Owens MRN: [id number] DOB/AGE: 1945/04/10 72 y.o.  Admit date: 10/31/2017 Discharge date: 11/02/2017  Primary Diagnosis:  Right shoulder rotator cuff tear  Admission Diagnoses:  Past Medical History:  Diagnosis Date  . Anxiety state    NOS  . Arthritis    osteo...right knee  . CAD (coronary artery disease)    NSTEMI 8/13 => LHC 09/27/11: oLM 50%, dLM 30%, oLAD 60-70%, pLAD 50%, mLAD 70%, pD1 30%, pD2 40%, oRCA 25%, mRCA 40%, EF 50% with ant HK => LAD not amenable to PCI => med Rx  unless recurrent angina = > consider CABG  . Carpal tunnel syndrome   . Complication of anesthesia   . Corneal disorder   . Diverticular disease   . Dysmetabolic syndrome X   . GERD (gastroesophageal reflux disease)   . 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   [name] Headache, paroxysmal hemicrania, chronic 07/23/2012    Referral to BOTOX evaluation with dr Rexene Alberts or Krista Blue .  [name] Heart attack ([place]) 09/2011  . Hernia    hiatal  . HLD (hyperlipidemia)   . Hx of adenomatous colonic polyps   . Hypertension    essen. NOS  . Ischemic cardiomyopathy    a.  Echo 09/29/11: EF 30-35% with inferior, posterior, lateral AK, anterior severe HK, mild LVH, mild MR, PASP 40;  => b. follow up echo 10/13:  EF 55-60%, Gr 1 diast dysfn, mild MR  . Migraine headache without aura 07/23/2012    Left sided  Lamonte Sakai thought to be migrainous  , hypersensitivity to touch , photophobia ,  no nausea.   [name] PONV (postoperative nausea and vomiting)   . Shoulder fracture   . Sleep apnea    CPAP   Discharge Diagnoses:   Active Problems:   Right rotator cuff tear arthropathy  Procedure:  Procedure(s) (LRB): Right rotator cuff repair with graft and anchor, acromioplasty (Right)   Consults: None  HPI:  The patient presented to the office with right shoulder pain. She has developed right shoulder pain several months ago  with no known injury. She did not respond to conservative treatments including activity modification and cortisone injections. MRI showed a torn right rotator cuff.     Laboratory Data: Hospital Outpatient Visit on 10/25/2017  Component Date Value Ref Range Status  . aPTT 10/25/2017 27  24 - 36 seconds Final   Performed at [facility], [place] [address], [place], [place] [address]  . WBC 10/25/2017 9.1  4.0 - 10.5 K/uL Final  . RBC 10/25/2017 4.38  3.87 - 5.11 MIL/uL Final  . Hemoglobin 10/25/2017 12.3  12.0 - 15.0 g/dL Final  . HCT 10/25/2017 38.4  36.0 - 46.0 % Final  . MCV 10/25/2017 87.7  78.0 - 100.0 fL Final  . MCH 10/25/2017 28.1  26.0 - 34.0 pg Final  . MCHC 10/25/2017 32.0  30.0 - 36.0 g/dL Final  . RDW 10/25/2017 13.5  11.5 - 15.5 % Final  . Platelets 10/25/2017 361  150 - 400 K/uL Final  . Neutrophils Relative % 10/25/2017 64  % Final  . Neutro Abs 10/25/2017 5.9  1.7 - 7.7 K/uL Final  . Lymphocytes Relative 10/25/2017 24  % Final  . Lymphs Abs 10/25/2017 2.2  0.7 - 4.0 K/uL Final  . Monocytes Relative 10/25/2017 10  % Final  . Monocytes Absolute 10/25/2017 0.9  0.1 -  1.0 K/uL Final  . Eosinophils Relative 10/25/2017 2  % Final  . Eosinophils Absolute 10/25/2017 0.2  0.0 - 0.7 K/uL Final  . Basophils Relative 10/25/2017 0  % Final  . Basophils Absolute 10/25/2017 0.0  0.0 - 0.1 K/uL Final   Performed at [facility], [place] [address] [place], [place] [address]  . Sodium 10/25/2017 137  135 - 145 mmol/L Final  . Potassium 10/25/2017 4.6  3.5 - 5.1 mmol/L Final  . Chloride 10/25/2017 102  98 - 111 mmol/L Final  . CO2 10/25/2017 27  22 - 32 mmol/L Final  . Glucose, Bld 10/25/2017 93  70 - 99 mg/dL Final  . BUN 10/25/2017 12  8 - 23 mg/dL Final  . Creatinine, Ser 10/25/2017 0.63  0.44 - 1.00 mg/dL Final  . Calcium 10/25/2017 9.6  8.9 - 10.3 mg/dL Final  . Total Protein 10/25/2017 7.4  6.5 - 8.1 g/dL Final  . Albumin 10/25/2017 3.9  3.5 -  5.0 g/dL Final  . AST 10/25/2017 19  15 - 41 U/L Final  . ALT 10/25/2017 15  0 - 44 U/L Final  . Alkaline Phosphatase 10/25/2017 69  38 - 126 U/L Final  . Total Bilirubin 10/25/2017 0.4  0.3 - 1.2 mg/dL Final  . GFR calc non Af Amer 10/25/2017 >60  >60 mL/min Final  . GFR calc Af Amer 10/25/2017 >60  >60 mL/min Final   Comment: (NOTE) The eGFR has been calculated using the CKD EPI equation. This calculation has not been validated in all clinical situations. eGFR's persistently <60 mL/min signify possible Chronic Kidney Disease.   Georgiann Hahn gap 10/25/2017 8  5 - 15 Final   Performed at [facility], [place] [address] [place], [place] [address]  . Prothrombin Time 10/25/2017 12.8  11.4 - 15.2 seconds Final  . INR 10/25/2017 0.97   Final   Performed at [facility], [place] [address], [place], [place] [address]    X-Rays:Dg Chest Port 1 View  Result Date: 11/01/2017 CLINICAL DATA:  Onset of shortness of breath when arising this morning to go to the bathroom. The patient underwent right shoulder surgery yesterday. EXAM: PORTABLE CHEST 1 VIEW COMPARISON:  PA and lateral chest x-ray dated October 06, 2011 FINDINGS: The lungs are mildly hypoinflated. There is new increased density at the left lung base. There is no pleural effusion or pneumothorax. The cardiac silhouette is enlarged which is not a new finding. The pulmonary vascularity is not engorged. There is a large hiatal hernia. There is calcification in the wall of the aortic arch. The observed bony thorax is unremarkable. IMPRESSION: New left basilar atelectasis or pneumonia. Bilateral hypoinflation which accentuates the cardiac silhouette and mediastinum. When the patient can tolerate the procedure, a PA and lateral chest x-ray with deep inspiration would be useful. Electronically Signed   By: David  Martinique M.D.   On: 11/01/2017 09:03     Hospital Course: Patient was admitted to [facility] and taken  to the OR and underwent the above state procedure without complications.  Patient tolerated the procedure well and was later transferred to the recovery room and then to the orthopaedic floor for postoperative care.  They were given PO and IV analgesics for pain control following their surgery.  They were given 24 hours of postoperative antibiotics.   She did have some issues with SOB and tachycardia. Cardiology cleared the patient.  Discharge planning was consulted to help with postop  disposition and equipment needs.  Patient had a fair night on the evening of surgery. Patient was seen in rounds and was ready to go home on day one after cardio clearance.  They were given discharge instructions and dressing directions.  They were instructed on when to follow up in the office with Dr. Gladstone Lighter.   Diet: Cardiac diet Activity:Weat sling at all times Follow-up:in 12 days Disposition - Home Discharged Condition: stable   Discharge Instructions    Call MD / Call 911   Complete by:  As directed    If you experience chest pain or shortness of breath, CALL 911 and be transported to the hospital emergency room.  If you develope a fever above 101 F, pus (white drainage) or increased drainage or redness at the wound, or calf pain, call your surgeon's office.   Constipation Prevention   Complete by:  As directed    Drink plenty of fluids.  Prune juice may be helpful.  You may use a stool softener, such as Colace (over the counter) 100 mg twice a day.  Use MiraLax (over the counter) for constipation as needed.   Diet - low sodium heart healthy   Complete by:  As directed    Discharge instructions   Complete by:  As directed    Keep your sling on at all times, including sleeping in your sling. You may shower with the dressing on. It is waterproof. If the dressing becomes compromised, remove it and replace with gauze and paper tape.  No driving until further notice. No lifting or overhead movements.  Call  Dr. Gladstone Lighter if any wound complications or temperature of 101 degrees F or over.  Call the office for an appointment to see Dr. Gladstone Lighter in two weeks: [phone] and ask for Dr. Charlestine Night nurse, Brunilda Payor   Increase activity slowly as tolerated   Complete by:  As directed      Allergies as of 11/01/2017      Reactions   Iohexol Rash   rash   Iodinated Diagnostic Agents Other (See Comments), Rash   Other Reaction: Other reaction   Statins Other (See Comments)   Cause muscle aches/pains   Imdur [isosorbide Nitrate]    Severe headaches   Latex Rash   Tape Rash, Other (See Comments)   Plastic tap-blisters   Verapamil Other (See Comments)   dizziness      Medication List    TAKE these medications   ALIVE ONCE DAILY WOMENS 50+ Tabs Take 1 tablet by mouth daily.   aspirin 81 MG tablet Take 81 mg by mouth daily.   azelastine 0.1 % nasal spray Commonly known as:  ASTELIN Place 2 sprays into both nostrils 2 (two) times daily. Use in each nostril as directed What changed:    when to take this  reasons to take this   brimonidine-timolol 0.2-0.5 % ophthalmic solution Commonly known as:  COMBIGAN Place 1 drop into the left eye 2 (two) times daily. Place 1 drop into left eye twice daily (morning & evening)   carvedilol 6.25 MG tablet Commonly known as:  COREG Take 1 tablet (6.25 mg total) by mouth 2 (two) times daily.   clobetasol ointment 0.05 % Commonly known as:  TEMOVATE Apply 1 application topically 2 (two) times daily. What changed:    when to take this  reasons to take this   clopidogrel 75 MG tablet Commonly known as:  PLAVIX Take 1 tablet (75 mg total) by mouth daily.  estradiol 1 MG tablet Commonly known as:  ESTRACE Take 1 mg by mouth daily.   gabapentin 100 MG capsule Commonly known as:  NEURONTIN 2 po tid for pain What changed:    how much to take  how to take this  when to take this  additional instructions   losartan 50 MG  tablet Commonly known as:  COZAAR Take 1 tablet (50 mg total) by mouth daily. Needs appt. What changed:    when to take this  additional instructions   nitroGLYCERIN 0.4 MG SL tablet Commonly known as:  NITROSTAT Place 1 tablet (0.4 mg total) under the tongue every 5 (five) minutes x 3 doses as needed for chest pain.   nortriptyline 10 MG capsule Commonly known as:  PAMELOR Take 5 capsules (50 mg total) by mouth at bedtime. What changed:  how much to take   oxyCODONE 5 MG immediate release tablet Commonly known as:  Oxy IR/ROXICODONE Take 1-2 tablets (5-10 mg total) by mouth every 6 (six) hours as needed for moderate pain (pain score 4-6).   prednisoLONE acetate 1 % ophthalmic suspension Commonly known as:  PRED FORTE Place 1 drop into both eyes 2 (two) times daily.   tiZANidine 4 MG tablet Commonly known as:  ZANAFLEX Take 4 mg by mouth every 8 (eight) hours as needed for muscle spasms.   XALATAN 0.005 % ophthalmic solution Generic drug:  latanoprost Place 1 drop into both eyes at bedtime.   zolpidem 12.5 MG CR tablet Commonly known as:  AMBIEN CR Take 1 tablet (12.5 mg total) by mouth at bedtime as needed for sleep. What changed:  when to take this      Follow-up Information    Latanya Maudlin, MD. Schedule an appointment as soon as possible for a visit on 11/12/2017.   Specialty:  Orthopedic Surgery Contact information: [address] [place] [address] [phone]        Minus Breeding, MD. Schedule an appointment as soon as possible for a visit in 1 week(s).   Specialty:  Cardiology Contact information: [address] [place] [address] [phone]           Signed: Ardeen Jourdain, PA-C Orthopaedic Surgery 11/02/2017, 9:23 AM

## 2017-11-05 ENCOUNTER — Telehealth: Payer: Self-pay

## 2017-11-05 NOTE — Telephone Encounter (Signed)
Second call made to patient regarding hospital follow up. Left message for return call.[name]

## 2017-11-15 DIAGNOSIS — Z5189 Encounter for other specified aftercare: Secondary | ICD-10-CM | POA: Insufficient documentation

## 2017-11-19 ENCOUNTER — Encounter: Payer: Self-pay | Admitting: Family Medicine

## 2017-11-19 ENCOUNTER — Ambulatory Visit (INDEPENDENT_AMBULATORY_CARE_PROVIDER_SITE_OTHER): Payer: Medicare Other | Admitting: Family Medicine

## 2017-11-19 ENCOUNTER — Ambulatory Visit (HOSPITAL_BASED_OUTPATIENT_CLINIC_OR_DEPARTMENT_OTHER)
Admission: RE | Admit: 2017-11-19 | Discharge: 2017-11-19 | Disposition: A | Discharge status: Home / Self Care | Payer: Medicare Other | Source: Ambulatory Visit | Attending: Family Medicine | Admitting: Family Medicine

## 2017-11-19 VITALS — BP 106/54 | HR 88 | Temp 97.9°F | Resp 16 | Ht 65.0 in | Wt 196.8 lb

## 2017-11-19 DIAGNOSIS — K449 Diaphragmatic hernia without obstruction or gangrene: Secondary | ICD-10-CM | POA: Diagnosis not present

## 2017-11-19 DIAGNOSIS — R059 Cough, unspecified: Secondary | ICD-10-CM

## 2017-11-19 DIAGNOSIS — J189 Pneumonia, unspecified organism: Secondary | ICD-10-CM | POA: Diagnosis not present

## 2017-11-19 DIAGNOSIS — R05 Cough: Secondary | ICD-10-CM

## 2017-11-19 MED ORDER — AZITHROMYCIN 250 MG PO TABS: ORAL_TABLET | ORAL | 0 refills | Status: DC

## 2017-11-19 MED ORDER — PROMETHAZINE-DM 6.25-15 MG/5ML PO SYRP: 5.0000 mL | ORAL_SOLUTION | Freq: Four times a day (QID) | ORAL | 0 refills | Status: DC | PRN

## 2017-11-19 NOTE — Progress Notes (Signed)
Patient ID: Connie Owens, female    DOB: 1945/06/21  Age: 72 y.o. MRN: [id number]    Subjective:  Subjective  HPI [name] presents for f/u sob/ chest pain from hospital.  She saw cardiology while in the hospital and was cleared for surgery.  cxr read ? Pneumonia.  Pt had surgery .  Her cough has worsened since surgery.   No fever.  + chills   Cough not productive    Review of Systems  Constitutional: Negative for fever.  HENT: Negative for congestion.   Respiratory: Positive for cough. Negative for chest tightness and shortness of breath.   Cardiovascular: Negative for chest pain, palpitations and leg swelling.  Gastrointestinal: Negative for abdominal pain, blood in stool and nausea.  Genitourinary: Negative for dysuria and frequency.  Skin: Negative for rash.  Allergic/Immunologic: Negative for environmental allergies.  Neurological: Negative for dizziness and headaches.  Psychiatric/Behavioral: The patient is not nervous/anxious.     History Past Medical History:  Diagnosis Date  . Anxiety state    NOS  . Arthritis    osteo...right knee  . CAD (coronary artery disease)    NSTEMI 8/13 => LHC 09/27/11: oLM 50%, dLM 30%, oLAD 60-70%, pLAD 50%, mLAD 70%, pD1 30%, pD2 40%, oRCA 25%, mRCA 40%, EF 50% with ant HK => LAD not amenable to PCI => med Rx  unless recurrent angina = > consider CABG  . Carpal tunnel syndrome   . Complication of anesthesia   . Corneal disorder   . Diverticular disease   . Dysmetabolic syndrome X   . GERD (gastroesophageal reflux disease)   . 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   Connie Owens Headache, paroxysmal hemicrania, chronic 07/23/2012    Referral to BOTOX evaluation with dr Rexene Alberts or Krista Blue .  Connie Owens Heart attack ([place]) 09/2011  . Hernia    hiatal  . HLD (hyperlipidemia)   . Hx of adenomatous colonic polyps   . Hypertension    essen. NOS  . Ischemic cardiomyopathy    a.  Echo  09/29/11: EF 30-35% with inferior, posterior, lateral AK, anterior severe HK, mild LVH, mild MR, PASP 40;  => b. follow up echo 10/13:  EF 55-60%, Gr 1 diast dysfn, mild MR  . Migraine headache without aura 07/23/2012    Left sided  Lamonte Sakai thought to be migrainous  , hypersensitivity to touch , photophobia ,  no nausea.   Connie Owens PONV (postoperative nausea and vomiting)   . Shoulder fracture   . 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   Her family history includes Breast cancer in her mother and sister; Dementia in her father; Diabetes in her paternal grandfather and sister; Heart disease in her father and sister; Prostate cancer in her father; Stroke in her father.She reports that she has never smoked. She has never used smokeless tobacco. She reports that she drinks alcohol. She reports that she does not use drugs.  Current Outpatient Medications  on File Prior to Visit  Medication Sig Dispense Refill  . aspirin 81 MG tablet Take 81 mg by mouth daily.    Connie Owens azelastine (ASTELIN) 0.1 % nasal spray Place 2 sprays into both nostrils 2 (two) times daily. Use in each nostril as directed (Patient taking differently: Place 2 sprays into both nostrils 2 (two) times daily as needed (allergies.). Use in each nostril as directed) 30 mL 12  .  brimonidine-timolol (COMBIGAN) 0.2-0.5 % ophthalmic solution Place 1 drop into the left eye 2 (two) times daily. Place 1 drop into left eye twice daily (morning & evening)    . carvedilol (COREG) 6.25 MG tablet Take 1 tablet (6.25 mg total) by mouth 2 (two) times daily. 180 tablet 3  . clobetasol ointment (TEMOVATE) 3.87 % Apply 1 application topically 2 (two) times daily. (Patient taking differently: Apply 1 application topically 2 (two) times daily as needed (psoriasis). ) 90 g 1  . clopidogrel (PLAVIX) 75 MG tablet Take 1 tablet (75 mg total) by mouth daily. 90 tablet 3  . estradiol (ESTRACE) 1 MG tablet Take 1 mg by mouth daily.     Connie Owens gabapentin (NEURONTIN) 100 MG capsule 2 po tid for pain (Patient taking differently: Take 100-200 mg by mouth See admin instructions. Take 1 capsule in the morning, 1 capsule at mid day, & 2 capsules at bedtime) 540 capsule 1  . latanoprost (XALATAN) 0.005 % ophthalmic solution Place 1 drop into both eyes at bedtime.     Connie Owens losartan (COZAAR) 50 MG tablet Take 1 tablet (50 mg total) by mouth daily. Needs appt. (Patient taking differently: Take 50 mg by mouth every evening. ) 90 tablet 3  . Multiple Vitamins-Minerals (ALIVE ONCE DAILY WOMENS 50+) TABS Take 1 tablet by mouth daily.    . nitroGLYCERIN (NITROSTAT) 0.4 MG SL tablet Place 1 tablet (0.4 mg total) under the tongue every 5 (five) minutes x 3 doses as needed for chest pain. 50 tablet 1  . nortriptyline (PAMELOR) 10 MG capsule Take 5 capsules (50 mg total) by mouth at bedtime. (Patient taking differently: Take 40-50 mg by mouth at bedtime. ) 450 capsule 3  . prednisoLONE acetate (PRED FORTE) 1 % ophthalmic suspension Place 1 drop into both eyes 2 (two) times daily.    Connie Owens tiZANidine (ZANAFLEX) 4 MG tablet Take 4 mg by mouth every 8 (eight) hours as needed for muscle spasms.    Connie Owens zolpidem (AMBIEN CR) 12.5 MG CR tablet Take 1 tablet (12.5 mg total) by mouth at bedtime as needed for sleep. (Patient taking differently:  Take 12.5 mg by mouth at bedtime. ) 90 tablet 1   No current facility-administered medications on file prior to visit.      Objective:  Objective  Physical Exam  Constitutional: She is oriented to person, place, and time. She appears well-developed and well-nourished.  HENT:  Head: Normocephalic and atraumatic.  Eyes: Conjunctivae and EOM are normal.  Neck: Normal range of motion. Neck supple. No JVD present. Carotid bruit is not present. No thyromegaly present.  Cardiovascular: Normal rate, regular rhythm and normal heart sounds.  No murmur heard. Pulmonary/Chest: Effort normal and breath sounds normal. No respiratory distress. She has no wheezes. She has no rales. She exhibits no tenderness.  Musculoskeletal: She exhibits no edema.  Neurological: She is alert and oriented to person, place, and time.  Psychiatric: She has a normal mood and affect.  Nursing note and vitals reviewed.  BP (!) 106/54 (BP Location: Left Arm,  Cuff Size: Large)   Pulse 88   Temp 97.9 F (36.6 C) (Oral)   Resp 16   Ht 5\' 5"  (1.651 m)   Wt 196 lb 12.8 oz (89.3 kg)   SpO2 97%   BMI 32.75 kg/m  Wt Readings from Last 3 Encounters:  11/19/17 196 lb 12.8 oz (89.3 kg)  10/31/17 199 lb (90.3 kg)  10/25/17 200 lb 2 oz (90.8 kg)     Lab Results  Component Value Date   WBC 9.1 10/25/2017   HGB 12.3 10/25/2017   HCT 38.4 10/25/2017   PLT 361 10/25/2017   GLUCOSE 93 10/25/2017   CHOL 188 04/10/2017   TRIG 127.0 04/10/2017   HDL 53.30 04/10/2017   LDLDIRECT 140.8 03/18/2013   LDLCALC 110 (H) 04/10/2017   ALT 15 10/25/2017   AST 19 10/25/2017   NA 137 10/25/2017   K 4.6 10/25/2017   CL 102 10/25/2017   CREATININE 0.63 10/25/2017   BUN 12 10/25/2017   CO2 27 10/25/2017   TSH 2.00 12/16/2015   INR 0.97 10/25/2017   HGBA1C 5.9 12/29/2013   MICROALBUR <0.7 11/17/2014    No results found.   Assessment & Plan:  Plan  I have discontinued Ivan Anchors. Cressy's oxyCODONE. I am also having her start  on azithromycin and promethazine-dextromethorphan. Additionally, I am having her maintain her estradiol, aspirin, brimonidine-timolol, prednisoLONE acetate, nitroGLYCERIN, gabapentin, clobetasol ointment, azelastine, latanoprost, carvedilol, clopidogrel, losartan, nortriptyline, zolpidem, tiZANidine, and ALIVE ONCE DAILY WOMENS 50+.  Meds ordered this encounter  Medications  . azithromycin (ZITHROMAX Z-PAK) 250 MG tablet    Sig: As directed4    Dispense:  6 each    Refill:  0  . promethazine-dextromethorphan (PROMETHAZINE-DM) 6.25-15 MG/5ML syrup    Sig: Take 5 mLs by mouth 4 (four) times daily as needed.    Dispense:  118 mL    Refill:  0    Problem List Items Addressed This Visit    None    Visit Diagnoses    Cough    -  Primary   Relevant Medications   azithromycin (ZITHROMAX Z-PAK) 250 MG tablet   promethazine-dextromethorphan (PROMETHAZINE-DM) 6.25-15 MG/5ML syrup   Other Relevant Orders   DG Chest 2 View (Completed)   Pneumonia due to infectious organism, unspecified laterality, unspecified part of lung       Relevant Medications   azithromycin (ZITHROMAX Z-PAK) 250 MG tablet   promethazine-dextromethorphan (PROMETHAZINE-DM) 6.25-15 MG/5ML syrup    use incentive spirometry Get cxr today Cough med per orders z pack rto prn  Follow-up: Return if symptoms worsen or fail to improve.  Ann Held, DO

## 2017-11-19 NOTE — Patient Instructions (Signed)

## 2017-11-22 NOTE — Progress Notes (Signed)
HPI The patient presents for followup of her cardiomyopathy and coronary disease that we are managing medically. She had a non-Q-wave myocardial infarction in the past.  At that time her ejection fraction by echo was 30-35% though it was higher by cath. Follow up echo demonstrated her EF to be 60-65% on the last echo in Jan.  She had a negative POET (Plain Old Exercise Treadmill) in 2018.   We have been unable to titrate meds and her beta blocker was reduced secondary to low BPs.  She returns for follow up.    Since I last saw her she was in the hospital to have a rotator cuff repair.  I reviewed these records because she did have shortness of breath and some palpitations twice and was seen by our service in consultation.  I reviewed that note.  Her cardiac enzymes were negative.  There were no acute EKG changes.  It was suggested that she might need a follow-up perfusion study on discharge.  The patient returns for follow-up and has had no new problems.  She denies any chest pressure, neck or arm discomfort.  She has postoperative shoulder discomfort but this is improving.  She has not had any of the shortness of breath that she had been having.  She denies any PND or orthopnea.  She is had no edema.  Allergies  Allergen Reactions  . Iohexol Rash    rash  . Iodinated Diagnostic Agents Other (See Comments) and Rash    Other Reaction: Other reaction  . Statins Other (See Comments)    Cause muscle aches/pains  . Imdur [Isosorbide Nitrate]     Severe headaches  . Latex Rash  . Tape Rash and Other (See Comments)    Plastic tap-blisters  . Verapamil Other (See Comments)    dizziness    Current Outpatient Medications  Medication Sig Dispense Refill  . aspirin 81 MG tablet Take 81 mg by mouth daily.    [name] azelastine (ASTELIN) 0.1 % nasal spray Place 2 sprays into both nostrils 2 (two) times daily. Use in each nostril as directed 30 mL 12  . brimonidine-timolol (COMBIGAN) 0.2-0.5 % ophthalmic  solution Place 1 drop into the left eye 2 (two) times daily. Place 1 drop into left eye twice daily (morning & evening)    . carvedilol (COREG) 6.25 MG tablet Take 1 tablet (6.25 mg total) by mouth 2 (two) times daily. 180 tablet 3  . clobetasol ointment (TEMOVATE) 2.42 % Apply 1 application topically 2 (two) times daily. 90 g 1  . clopidogrel (PLAVIX) 75 MG tablet Take 1 tablet (75 mg total) by mouth daily. 90 tablet 3  . estradiol (ESTRACE) 1 MG tablet Take 1 mg by mouth daily.     [name] gabapentin (NEURONTIN) 100 MG capsule 2 po tid for pain 540 capsule 1  . latanoprost (XALATAN) 0.005 % ophthalmic solution Place 1 drop into both eyes at bedtime.     [name] losartan (COZAAR) 50 MG tablet Take 1 tablet (50 mg total) by mouth daily. Needs appt. 90 tablet 3  . Multiple Vitamins-Minerals (ALIVE ONCE DAILY WOMENS 50+) TABS Take 1 tablet by mouth daily.    . nitroGLYCERIN (NITROSTAT) 0.4 MG SL tablet Place 1 tablet (0.4 mg total) under the tongue every 5 (five) minutes x 3 doses as needed for chest pain. 50 tablet 1  . nortriptyline (PAMELOR) 10 MG capsule Take 5 capsules (50 mg total) by mouth at bedtime. 450 capsule 3  .  prednisoLONE acetate (PRED FORTE) 1 % ophthalmic suspension Place 1 drop into both eyes 2 (two) times daily.    . promethazine-dextromethorphan (PROMETHAZINE-DM) 6.25-15 MG/5ML syrup Take 5 mLs by mouth 4 (four) times daily as needed. 118 mL 0  . tiZANidine (ZANAFLEX) 4 MG tablet Take 4 mg by mouth every 8 (eight) hours as needed for muscle spasms.    [name] zolpidem (AMBIEN CR) 12.5 MG CR tablet Take 1 tablet (12.5 mg total) by mouth at bedtime as needed for sleep. 90 tablet 1   No current facility-administered medications for this visit.     Past Medical History:  Diagnosis Date  . Anxiety state    NOS  . Arthritis    osteo...right knee  . CAD (coronary artery disease)    NSTEMI 8/13 => LHC 09/27/11: oLM 50%, dLM 30%, oLAD 60-70%, pLAD 50%, mLAD 70%, pD1 30%, pD2 40%, oRCA 25%, mRCA 40%,  EF 50% with ant HK => LAD not amenable to PCI => med Rx  unless recurrent angina = > consider CABG  . Carpal tunnel syndrome   . Complication of anesthesia   . Corneal disorder   . Diverticular disease   . Dysmetabolic syndrome X   . GERD (gastroesophageal reflux disease)   . 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   [name] Headache, paroxysmal hemicrania, chronic 07/23/2012    Referral to BOTOX evaluation with dr Rexene Alberts or Krista Blue .  [name] Heart attack ([place]) 09/2011  . Hernia    hiatal  . HLD (hyperlipidemia)   . Hx of adenomatous colonic polyps   . Hypertension    essen. NOS  . Ischemic cardiomyopathy    a.  Echo 09/29/11: EF 30-35% with inferior, posterior, lateral AK, anterior severe HK, mild LVH, mild MR, PASP 40;  => b. follow up echo 10/13:  EF 55-60%, Gr 1 diast dysfn, mild MR  . Migraine headache without aura 07/23/2012    Left sided  Lamonte Sakai thought to be migrainous  , hypersensitivity to touch , photophobia ,  no nausea.   [name] PONV (postoperative nausea and vomiting)   . Shoulder fracture   . Sleep apnea    CPAP    Past Surgical History:  Procedure Laterality Date  . ABDOMINAL HYSTERECTOMY    . ABDOMINOPLASTY  10/23/2016  . ankle cystectomy Right   . APPENDECTOMY    . BRACHIOPLASTY Bilateral 05/02/2016  . BREAST EXCISIONAL BIOPSY Right 02/15/1994  . BREAST LUMPECTOMY Right 1996  . BUNIONECTOMY Right   . CARDIAC CATHETERIZATION  09/28/2011   Dr Acie Fredrickson(  to see Dr Ron Parker)  . CHOLECYSTECTOMY    . COLONOSCOPY W/ POLYPECTOMY    . 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 CORNEAL TRANSPLANT Left    x1  . HEMORRHOID SURGERY  12/01/2010, 2014   [place] hemorrhoid ligation/pexy  . LEFT HEART CATHETERIZATION WITH CORONARY ANGIOGRAM N/A 09/28/2011   Procedure: LEFT HEART CATHETERIZATION WITH CORONARY ANGIOGRAM;  Surgeon: Minus Breeding, MD;  Location: [facility] CATH LAB;  Service:  Cardiovascular;  Laterality: N/A;  . lumbar spur removed    . rectocele surgery    . SHOULDER OPEN ROTATOR CUFF REPAIR Right 10/31/2017   Procedure: Right rotator cuff repair with graft and anchor, acromioplasty;  Surgeon: Latanya Maudlin, MD;  Location: [facility] ORS;  Service: Orthopedics;  Laterality: Right;  . TOTAL KNEE ARTHROPLASTY Right     ROS:  As stated in the HPI and negative for all other systems.  PHYSICAL EXAM BP 124/80   Pulse (!) 59   Ht 5\' 5"  (1.651 m)   Wt 198 lb (89.8 kg)   SpO2 94%   BMI 32.95 kg/m   GENERAL:  Well appearing NECK:  No jugular venous distention, waveform within normal limits, carotid upstroke brisk and symmetric, no bruits, no thyromegaly LUNGS:  Clear to auscultation bilaterally CHEST:  Unremarkable HEART:  PMI not displaced or sustained,S1 and S2 within normal limits, no S3, no S4, no clicks, no rubs, no murmurs ABD:  Flat, positive bowel sounds normal in frequency in pitch, no bruits, no rebound, no guarding, no midline pulsatile mass, no hepatomegaly, no splenomegaly EXT:  2 plus pulses throughout, no edema, no cyanosis no clubbing   EKG:  NA  Lab Results  Component Value Date   CHOL 188 04/10/2017   TRIG 127.0 04/10/2017   HDL 53.30 04/10/2017   LDLCALC 110 (H) 04/10/2017   LDLDIRECT 140.8 03/18/2013    ASSESSMENT AND PLAN  Coronary Artery Disease:  The patient has no symptoms consistent with unstable angina.  At this point I do not think there is need for further cardiovascular testing as was suggested in the hospital.  There was no objective evidence of ischemia.  She is feeling well.  I am continuing maximal medical therapy and that does include DAPT.  She and I have talked about the risks benefits of this.   Ischemic Cardiomyopathy:  Her EF imrpoved to normal in Jan.  No change in therapy.   Hyperlipidemia:  She has not tolerated statin.  At the last visit she agreed to take Zetia.   However, she could not tolerate this.  She said  it kept her awake.  She does agree to consider a PCSK9 inhibitor.  I will refer her to our [facility].   Hypertension:    The blood pressure is at target. No change in medications is indicated. We will continue with therapeutic lifestyle changes (TLC).

## 2017-11-23 ENCOUNTER — Encounter: Payer: Self-pay | Admitting: Cardiology

## 2017-11-23 ENCOUNTER — Ambulatory Visit (INDEPENDENT_AMBULATORY_CARE_PROVIDER_SITE_OTHER): Payer: Medicare Other | Admitting: Cardiology

## 2017-11-23 VITALS — BP 124/80 | HR 59 | Ht 65.0 in | Wt 198.0 lb

## 2017-11-23 DIAGNOSIS — E785 Hyperlipidemia, unspecified: Secondary | ICD-10-CM | POA: Diagnosis not present

## 2017-11-23 DIAGNOSIS — I10 Essential (primary) hypertension: Secondary | ICD-10-CM | POA: Diagnosis not present

## 2017-11-23 DIAGNOSIS — I255 Ischemic cardiomyopathy: Secondary | ICD-10-CM | POA: Diagnosis not present

## 2017-11-23 DIAGNOSIS — I2510 Atherosclerotic heart disease of native coronary artery without angina pectoris: Secondary | ICD-10-CM | POA: Diagnosis not present

## 2017-11-23 NOTE — Patient Instructions (Signed)
Medication Instructions:  Continue current medications  If you need a refill on your cardiac medications before your next appointment, please call your pharmacy.  Labwork: None Ordered   If you have labs (blood work) drawn today and your tests are completely normal, you will receive your results only by: [name] MyChart Message (if you have MyChart) OR . A paper copy in the mail If you have any lab test that is abnormal or we need to change your treatment, we will call you to review the results.  Testing/Procedures: None Ordered  Follow-Up: You have been referred to [facility] in [place] will need a follow up appointment in 6 Months.  Please call our office 2 months in [phone]) to schedule the appointment.  You may see  DR Percival Spanish or one of the following Advanced Practice Providers on your designated Care Team:   . Rosaria Ferries, PA-C . Jory Sims, DNP, ANP  At [facility], you and your health needs are our priority.  As part of our continuing mission to provide you with exceptional heart care, we have created designated Provider Care Teams.  These Care Teams include your primary Cardiologist (physician) and Advanced Practice Providers (APPs -  Physician Assistants and Nurse Practitioners) who all work together to provide you with the care you need, when you need it.   Thank you for choosing [facility] at [facility]!!

## 2017-11-27 ENCOUNTER — Ambulatory Visit: Payer: Medicare Other

## 2017-11-29 ENCOUNTER — Ambulatory Visit (INDEPENDENT_AMBULATORY_CARE_PROVIDER_SITE_OTHER): Payer: Medicare Other | Admitting: Pharmacist Clinician (PhC)/ Clinical Pharmacy Specialist

## 2017-11-29 VITALS — BP 132/96

## 2017-11-29 DIAGNOSIS — E785 Hyperlipidemia, unspecified: Secondary | ICD-10-CM

## 2017-11-29 DIAGNOSIS — I10 Essential (primary) hypertension: Secondary | ICD-10-CM

## 2017-11-29 DIAGNOSIS — I255 Ischemic cardiomyopathy: Secondary | ICD-10-CM

## 2017-11-29 NOTE — Patient Instructions (Addendum)
Return for a a follow up appointment in one month for a blood pressure check  We will start the paperwork to get Repatha once monthly covered.    Your blood pressure today is 132/96  Check your blood pressure at home daily and keep record of the readings.  Take your BP meds as follows:  Increase your losartan to 100 mg once daily (take 2 of the 50 mg tablets until gone)  Bring all of your meds, your BP cuff and your record of home blood pressures to your next appointment.  Exercise as you're able, try to walk approximately 30 minutes per day.  Keep salt intake to a minimum, especially watch canned and prepared boxed foods.  Eat more fresh fruits and vegetables and fewer canned items.  Avoid eating in fast food restaurants.    HOW TO TAKE YOUR BLOOD PRESSURE: . Rest 5 minutes before taking your blood pressure. .  Don't smoke or drink caffeinated beverages for at least 30 minutes before. . Take your blood pressure before (not after) you eat. . Sit comfortably with your back supported and both feet on the floor (don't cross your legs). . Elevate your arm to heart level on a table or a desk. . Use the proper sized cuff. It should fit smoothly and snugly around your bare upper arm. There should be enough room to slip a fingertip under the cuff. The bottom edge of the cuff should be 1 inch above the crease of the elbow. . Ideally, take 3 measurements at one sitting and record the average.

## 2017-11-29 NOTE — Progress Notes (Signed)
11/30/2017 Connie Owens [birth year] [id number]   HPI:  Connie Owens is a 72 y.o. female patient of Dr Percival Spanish, who presents today for a lipid clinic evaluation.  In addition to hyperlipidemia, her medical history is significant for hypertension, CAD s/p NSTEMI, migraines, carpal tunnel syndrome, OSA (on CPAP) and obesity.     Patient has recently had right shoulder rotator cuff surgery and is waiting to start PT.  There were some issues during her hospitalization with SOB and tachycardia.  Dr. Johnsie Cancel saw the patient and found no acute EKG changes or elevated troponins.  At her follow up with Dr. Percival Spanish further testing was deemed unnecessary.  He was concerned about her lipid panel (from February) and asked that she follow up with [facility] to consider PCSK-9 inhibitor therapy.    Today patient reports feeling well, and is more concerned about some elevated blood pressure readings.  She brought her home meter in today for validation.      Losartan increase to 100 mg qd  Current Medications:  none Risk Factors:  Cholesterol Goals:   Intolerant/previously tried:  Atorvastatin 80 mg qd - myalgias in legs and buttocks  pravastatin 80 mg qd - myalgias  Rosuvastatin 20 mg qd - myalgias  Ezetimibe - caused insomnia  Family history:   Father  - stroke, dementia  Mother - living, will be 52 in about 2 weeks  Sisters all take cholesterol meds (2 IDDM - juvenile)  3 children without cholesterol issues  Diet:   50/50 - eating out is cafeteria style; doesn't care for fried/fast foods  Exercise:    Walk usually, rotator cuff surgery - PT  Labs:  @LASTLAB3 (LIPID)@  Current Outpatient Medications  Medication Sig Dispense Refill  . aspirin 81 MG tablet Take 81 mg by mouth daily.    [name] azelastine (ASTELIN) 0.1 % nasal spray Place 2 sprays into both nostrils 2 (two) times daily. Use in each nostril as directed 30 mL 12  . brimonidine-timolol (COMBIGAN) 0.2-0.5 % ophthalmic solution Place 1  drop into the left eye 2 (two) times daily. Place 1 drop into left eye twice daily (morning & evening)    . carvedilol (COREG) 6.25 MG tablet Take 1 tablet (6.25 mg total) by mouth 2 (two) times daily. 180 tablet 3  . clobetasol ointment (TEMOVATE) 4.09 % Apply 1 application topically 2 (two) times daily. 90 g 1  . clopidogrel (PLAVIX) 75 MG tablet Take 1 tablet (75 mg total) by mouth daily. 90 tablet 3  . estradiol (ESTRACE) 1 MG tablet Take 1 mg by mouth daily.     [name] gabapentin (NEURONTIN) 100 MG capsule 2 po tid for pain 540 capsule 1  . latanoprost (XALATAN) 0.005 % ophthalmic solution Place 1 drop into both eyes at bedtime.     [name] losartan (COZAAR) 50 MG tablet Take 1 tablet (50 mg total) by mouth daily. Needs appt. 90 tablet 3  . Multiple Vitamins-Minerals (ALIVE ONCE DAILY WOMENS 50+) TABS Take 1 tablet by mouth daily.    . nitroGLYCERIN (NITROSTAT) 0.4 MG SL tablet Place 1 tablet (0.4 mg total) under the tongue every 5 (five) minutes x 3 doses as needed for chest pain. 50 tablet 1  . nortriptyline (PAMELOR) 10 MG capsule Take 5 capsules (50 mg total) by mouth at bedtime. 450 capsule 3  . prednisoLONE acetate (PRED FORTE) 1 % ophthalmic suspension Place 1 drop into both eyes 2 (two) times daily.    . promethazine-dextromethorphan (PROMETHAZINE-DM) 6.25-15 MG/5ML syrup  Take 5 mLs by mouth 4 (four) times daily as needed. 118 mL 0  . tiZANidine (ZANAFLEX) 4 MG tablet Take 4 mg by mouth every 8 (eight) hours as needed for muscle spasms.    [name] zolpidem (AMBIEN CR) 12.5 MG CR tablet Take 1 tablet (12.5 mg total) by mouth at bedtime as needed for sleep. 90 tablet 1   No current facility-administered medications for this visit.     Allergies  Allergen Reactions  . Iohexol Rash    rash  . Iodinated Diagnostic Agents Other (See Comments) and Rash    Other Reaction: Other reaction  . Statins Other (See Comments)    Cause muscle aches/pains  . Imdur [Isosorbide Nitrate]     Severe headaches  .  Latex Rash  . Tape Rash and Other (See Comments)    Plastic tap-blisters  . Verapamil Other (See Comments)    dizziness    Past Medical History:  Diagnosis Date  . Anxiety state    NOS  . Arthritis    osteo...right knee  . CAD (coronary artery disease)    NSTEMI 8/13 => LHC 09/27/11: oLM 50%, dLM 30%, oLAD 60-70%, pLAD 50%, mLAD 70%, pD1 30%, pD2 40%, oRCA 25%, mRCA 40%, EF 50% with ant HK => LAD not amenable to PCI => med Rx  unless recurrent angina = > consider CABG  . Carpal tunnel syndrome   . Complication of anesthesia   . Corneal disorder   . Diverticular disease   . Dysmetabolic syndrome X   . GERD (gastroesophageal reflux disease)   . 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   [name] Headache, paroxysmal hemicrania, chronic 07/23/2012    Referral to BOTOX evaluation with dr Rexene Alberts or Krista Blue .  [name] Heart attack ([place]) 09/2011  . Hernia    hiatal  . HLD (hyperlipidemia)   . Hx of adenomatous colonic polyps   . Hypertension    essen. NOS  . Ischemic cardiomyopathy    a.  Echo 09/29/11: EF 30-35% with inferior, posterior, lateral AK, anterior severe HK, mild LVH, mild MR, PASP 40;  => b. follow up echo 10/13:  EF 55-60%, Gr 1 diast dysfn, mild MR  . Migraine headache without aura 07/23/2012    Left sided  Lamonte Sakai thought to be migrainous  , hypersensitivity to touch , photophobia ,  no nausea.   [name] PONV (postoperative nausea and vomiting)   . Shoulder fracture   . Sleep apnea    CPAP    Blood pressure (!) 132/96.   Essential hypertension Patient with elevated diastolic pressure and recent elevated home readings.  Will have her increase losartan to 100 mg once daily and continue with home BP monitoring.  She will return in 1 month for follow up.    Hyperlipidemia Patient with CAD and elevated lipids.  Last labs drawn were in February.  Will have patient go to lab in the next week for follow up.  Reviewed information  about PCSK-9 inhibitors and patient would prefer to use the Pushtronix device.  Once her labs are in we will submit the information to [employer] for approval.     Tommy Medal PharmD CPP [place] [address] [place], [place] [address]

## 2017-11-30 ENCOUNTER — Encounter: Payer: Self-pay | Admitting: Pharmacist Clinician (PhC)/ Clinical Pharmacy Specialist

## 2017-11-30 NOTE — Assessment & Plan Note (Signed)
Patient with CAD and elevated lipids.  Last labs drawn were in February.  Will have patient go to lab in the next week for follow up.  Reviewed information about PCSK-9 inhibitors and patient would prefer to use the Pushtronix device.  Once her labs are in we will submit the information to [facility] for approval.

## 2017-11-30 NOTE — Assessment & Plan Note (Signed)
Patient with elevated diastolic pressure and recent elevated home readings.  Will have her increase losartan to 100 mg once daily and continue with home BP monitoring.  She will return in 1 month for follow up.

## 2017-12-04 ENCOUNTER — Ambulatory Visit (INDEPENDENT_AMBULATORY_CARE_PROVIDER_SITE_OTHER): Payer: Medicare Other | Admitting: *Deleted

## 2017-12-04 DIAGNOSIS — Z23 Encounter for immunization: Secondary | ICD-10-CM | POA: Diagnosis not present

## 2017-12-07 DIAGNOSIS — E785 Hyperlipidemia, unspecified: Secondary | ICD-10-CM | POA: Diagnosis not present

## 2017-12-07 DIAGNOSIS — Z4789 Encounter for other orthopedic aftercare: Secondary | ICD-10-CM | POA: Diagnosis not present

## 2017-12-07 DIAGNOSIS — M1712 Unilateral primary osteoarthritis, left knee: Secondary | ICD-10-CM | POA: Insufficient documentation

## 2017-12-07 LAB — LIPID PANEL
CHOLESTEROL TOTAL: 199 mg/dL (ref 100–199)
Chol/HDL Ratio: 3.8 ratio (ref 0.0–4.4)
HDL: 53 mg/dL (ref >39)
LDL CALC: 112 mg/dL — AB (ref 0–99)
Triglycerides: 168 mg/dL — ABNORMAL HIGH (ref 0–149)
VLDL CHOLESTEROL CAL: 34 mg/dL (ref 5–40)

## 2017-12-10 ENCOUNTER — Encounter: Payer: Self-pay | Admitting: Gastroenterology

## 2017-12-10 DIAGNOSIS — M25511 Pain in right shoulder: Secondary | ICD-10-CM | POA: Diagnosis not present

## 2017-12-12 DIAGNOSIS — H401131 Primary open-angle glaucoma, bilateral, mild stage: Secondary | ICD-10-CM | POA: Diagnosis not present

## 2017-12-16 ENCOUNTER — Other Ambulatory Visit: Payer: Self-pay

## 2017-12-16 ENCOUNTER — Encounter (HOSPITAL_COMMUNITY): Payer: Self-pay | Admitting: Emergency Medicine

## 2017-12-16 ENCOUNTER — Emergency Department (HOSPITAL_COMMUNITY): Payer: Medicare Other

## 2017-12-16 ENCOUNTER — Emergency Department (HOSPITAL_COMMUNITY)
Admission: EM | Admit: 2017-12-16 | Discharge: 2017-12-16 | Disposition: A | Discharge status: Home / Self Care | Payer: Medicare Other | Attending: Emergency Medicine | Admitting: Emergency Medicine

## 2017-12-16 DIAGNOSIS — Z91040 Latex allergy status: Secondary | ICD-10-CM | POA: Insufficient documentation

## 2017-12-16 DIAGNOSIS — M25519 Pain in unspecified shoulder: Secondary | ICD-10-CM | POA: Diagnosis not present

## 2017-12-16 DIAGNOSIS — I2510 Atherosclerotic heart disease of native coronary artery without angina pectoris: Secondary | ICD-10-CM | POA: Insufficient documentation

## 2017-12-16 DIAGNOSIS — R52 Pain, unspecified: Secondary | ICD-10-CM | POA: Diagnosis not present

## 2017-12-16 DIAGNOSIS — M25511 Pain in right shoulder: Secondary | ICD-10-CM | POA: Insufficient documentation

## 2017-12-16 DIAGNOSIS — Z7902 Long term (current) use of antithrombotics/antiplatelets: Secondary | ICD-10-CM | POA: Insufficient documentation

## 2017-12-16 DIAGNOSIS — R0689 Other abnormalities of breathing: Secondary | ICD-10-CM | POA: Diagnosis not present

## 2017-12-16 DIAGNOSIS — R000 Tachycardia, unspecified: Secondary | ICD-10-CM | POA: Diagnosis not present

## 2017-12-16 DIAGNOSIS — I10 Essential (primary) hypertension: Secondary | ICD-10-CM | POA: Diagnosis not present

## 2017-12-16 DIAGNOSIS — Z7982 Long term (current) use of aspirin: Secondary | ICD-10-CM | POA: Diagnosis not present

## 2017-12-16 MED ORDER — KETOROLAC TROMETHAMINE 30 MG/ML IJ SOLN
15.0000 mg | Freq: Once | INTRAMUSCULAR | Status: AC
  Administered 2017-12-16: 15 mg via INTRAVENOUS
  Filled 2017-12-16: qty 1

## 2017-12-16 MED ORDER — HYDROMORPHONE HCL 1 MG/ML IJ SOLN
1.0000 mg | Freq: Once | INTRAMUSCULAR | Status: AC
  Administered 2017-12-16: 1 mg via INTRAVENOUS
  Filled 2017-12-16: qty 1

## 2017-12-16 MED ORDER — PREDNISONE 5 MG PO TABS: ORAL_TABLET | ORAL | 0 refills | Status: DC

## 2017-12-16 MED ORDER — PREDNISONE 20 MG PO TABS
30.0000 mg | ORAL_TABLET | Freq: Once | ORAL | Status: AC
  Administered 2017-12-16: 30 mg via ORAL
  Filled 2017-12-16: qty 2

## 2017-12-16 MED ORDER — MORPHINE SULFATE (PF) 4 MG/ML IV SOLN
4.0000 mg | Freq: Once | INTRAVENOUS | Status: AC
  Administered 2017-12-16: 4 mg via INTRAVENOUS
  Filled 2017-12-16: qty 1

## 2017-12-16 NOTE — ED Notes (Signed)
Pt states some relief of pain immediately following admin of dilaudid.  WCTM.

## 2017-12-16 NOTE — ED Provider Notes (Signed)
[facility] DEPT Provider Note   CSN: [id number] Arrival date & time: 12/16/17  1823     History   Chief Complaint Chief Complaint  Patient presents with  . Shoulder Pain    HPI Connie Owens is a 72 y.o. female.  She had rotator cuff surgery in mid-September.  She said she was doing well up until a few weeks ago when it acutely became bruised.  Today she began experiencing 10 out of 10 pain in the shoulder.  She denies any new injury.  Pain is primarily in her shoulder although radiates down the arm.  Does not associate with any numbness or weakness.  It increases with any movement of the shoulder.  The history is provided by the patient.  Shoulder Pain   This is a new problem. The current episode started 6 to 12 hours ago. The problem occurs constantly. The problem has not changed since onset.The pain is present in the right shoulder. The quality of the pain is described as pounding. The pain is at a severity of 10/10. Associated symptoms include limited range of motion. Pertinent negatives include no numbness. The symptoms are aggravated by activity. She has tried nothing for the symptoms. The treatment provided no relief. There has been no history of extremity trauma.    Past Medical History:  Diagnosis Date  . Anxiety state    NOS  . Arthritis    osteo...right knee  . CAD (coronary artery disease)    NSTEMI 8/13 => LHC 09/27/11: oLM 50%, dLM 30%, oLAD 60-70%, pLAD 50%, mLAD 70%, pD1 30%, pD2 40%, oRCA 25%, mRCA 40%, EF 50% with ant HK => LAD not amenable to PCI => med Rx  unless recurrent angina = > consider CABG  . Carpal tunnel syndrome   . Complication of anesthesia   . Corneal disorder   . Diverticular disease   . Dysmetabolic syndrome X   . GERD (gastroesophageal reflux disease)   . 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   [name] Headache, paroxysmal hemicrania, chronic  07/23/2012    Referral to BOTOX evaluation with dr Rexene Alberts or Krista Blue .  [name] Heart attack ([place]) 09/2011  . Hernia    hiatal  . HLD (hyperlipidemia)   . Hx of adenomatous colonic polyps   . Hypertension    essen. NOS  . Ischemic cardiomyopathy    a.  Echo 09/29/11: EF 30-35% with inferior, posterior, lateral AK, anterior severe HK, mild LVH, mild MR, PASP 40;  => b. follow up echo 10/13:  EF 55-60%, Gr 1 diast dysfn, mild MR  . Migraine headache without aura 07/23/2012    Left sided  Lamonte Sakai thought to be migrainous  , hypersensitivity to touch , photophobia ,  no nausea.   [name] PONV (postoperative nausea and vomiting)   . Shoulder fracture   . Sleep apnea    CPAP    Patient Active Problem List   Diagnosis Date Noted  . Right rotator cuff tear arthropathy 10/31/2017  . Cluster headache, not intractable 05/04/2016  . History of colonic polyps 18-Nov-202016  . CAD (coronary artery disease) 07/23/2014  . Glaucoma (increased eye pressure) 07/30/2013  . OSA on CPAP 07/30/2013  . Obesity, unspecified 07/30/2013  . Obesity (BMI 30-39.9) 03/18/2013  . Abdominal pain, other specified site 03/18/2013  . ERRONEOUS ENCOUNTER--DISREGARD 03/06/2013  . Internal hemorrhoids with other complication 93/90/3009  . Nonspecific abnormal finding in  stool contents 11/05/2012  . Abdominal pain, left upper quadrant 11/05/2012  . Diffuse pain 07/24/2012  . Migraine headache without aura 07/23/2012  . Headache, paroxysmal hemicrania, chronic 07/23/2012  . OSA (obstructive sleep apnea) 06/11/2012  . NSTEMI (non-ST elevated myocardial infarction) ([place]) 09/28/2011  . IBS (irritable bowel syndrome) 03/22/2011  . Hyperlipidemia 12/21/2010  . DIARRHEA, CHRONIC 01/13/2010  . COCCYGEAL PAIN 05/11/2009  . Anorectal pain 02/02/2009  . HIP PAIN, LEFT 02/02/2009  . CORNEAL DISORDER 12/11/2008  . POSTMENOPAUSAL STATUS 12/11/2008  . ADVERSE DRUG REACTION 12/11/2008  . DYSPHAGIA UNSPECIFIED 10/02/2008  . BACK PAIN 07/15/2008  .  Dysmetabolic syndrome X 41/74/0814  . OSTEOARTHRITIS, KNEE, RIGHT 06/19/2007  . CARPAL TUNNEL SYNDROME 04/25/2007  . ADENOMATOUS COLONIC POLYP 04/23/2007  . GERD 04/23/2007  . DIVERTICULAR DISEASE 04/23/2007  . HIATAL HERNIA 11/16/2006  . ANXIETY STATE NOS 11/12/2006  . Essential hypertension 10/30/2006    Past Surgical History:  Procedure Laterality Date  . ABDOMINAL HYSTERECTOMY    . ABDOMINOPLASTY  10/23/2016  . ankle cystectomy Right   . APPENDECTOMY    . BRACHIOPLASTY Bilateral 05/02/2016  . BREAST EXCISIONAL BIOPSY Right 02/15/1994  . BREAST LUMPECTOMY Right 1996  . BUNIONECTOMY Right   . CARDIAC CATHETERIZATION  09/28/2011   Dr Acie Fredrickson(  to see Dr Ron Parker)  . CHOLECYSTECTOMY    . COLONOSCOPY W/ POLYPECTOMY    . 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 CORNEAL TRANSPLANT Left    x1  . HEMORRHOID SURGERY  12/01/2010, 2014   [place] hemorrhoid ligation/pexy  . LEFT HEART CATHETERIZATION WITH CORONARY ANGIOGRAM N/A 09/28/2011   Procedure: LEFT HEART CATHETERIZATION WITH CORONARY ANGIOGRAM;  Surgeon: Minus Breeding, MD;  Location: [facility] CATH LAB;  Service: Cardiovascular;  Laterality: N/A;  . lumbar spur removed    . rectocele surgery    . SHOULDER OPEN ROTATOR CUFF REPAIR Right 10/31/2017   Procedure: Right rotator cuff repair with graft and anchor, acromioplasty;  Surgeon: Latanya Maudlin, MD;  Location: [facility] ORS;  Service: Orthopedics;  Laterality: Right;  . TOTAL KNEE ARTHROPLASTY Right      OB History   None      Home Medications    Prior to Admission medications   Medication Sig Start Date End Date Taking? Authorizing Provider  aspirin 81 MG tablet Take 81 mg by mouth daily.    [provider]  azelastine (ASTELIN) 0.1 % nasal spray Place 2 sprays into both nostrils 2 (two) times daily. Use in each nostril as directed 05/10/17   Saguier, Percell Miller, PA-C  brimonidine-timolol (COMBIGAN) 0.2-0.5 % ophthalmic solution Place  1 drop into the left eye 2 (two) times daily. Place 1 drop into left eye twice daily (morning & evening)    [provider]  carvedilol (COREG) 6.25 MG tablet Take 1 tablet (6.25 mg total) by mouth 2 (two) times daily. 07/11/17 07/11/18  Minus Breeding, MD  clobetasol ointment (TEMOVATE) 4.81 % Apply 1 application topically 2 (two) times daily. 04/10/17   Ann Held, DO  clopidogrel (PLAVIX) 75 MG tablet Take 1 tablet (75 mg total) by mouth daily. 07/11/17   Minus Breeding, MD  estradiol (ESTRACE) 1 MG tablet Take 1 mg by mouth daily.     [provider]  gabapentin (NEURONTIN) 100 MG capsule 2 po tid for pain 04/10/17   Carollee Herter, Kendrick Fries R, DO  latanoprost (XALATAN) 0.005 % ophthalmic solution Place 1 drop into both eyes at  bedtime.     [provider]  losartan (COZAAR) 50 MG tablet Take 1 tablet (50 mg total) by mouth daily. Needs appt. 07/11/17 07/11/18  Minus Breeding, MD  Multiple Vitamins-Minerals (ALIVE ONCE DAILY WOMENS 50+) TABS Take 1 tablet by mouth daily.    [provider]  nitroGLYCERIN (NITROSTAT) 0.4 MG SL tablet Place 1 tablet (0.4 mg total) under the tongue every 5 (five) minutes x 3 doses as needed for chest pain. 02/15/17 08/10/18  Lendon Colonel, NP  nortriptyline (PAMELOR) 10 MG capsule Take 5 capsules (50 mg total) by mouth at bedtime. 10/11/17   Ward Givens, NP  prednisoLONE acetate (PRED FORTE) 1 % ophthalmic suspension Place 1 drop into both eyes 2 (two) times daily.    [provider]  promethazine-dextromethorphan (PROMETHAZINE-DM) 6.25-15 MG/5ML syrup Take 5 mLs by mouth 4 (four) times daily as needed. 11/19/17   Ann Held, DO  tiZANidine (ZANAFLEX) 4 MG tablet Take 4 mg by mouth every 8 (eight) hours as needed for muscle spasms.    [provider]  zolpidem (AMBIEN CR) 12.5 MG CR tablet Take 1 tablet (12.5 mg total) by mouth at bedtime as needed for sleep. 10/11/17   Ward Givens, NP     Family History Family History  Problem Relation Age of Onset  . Breast cancer Mother   . Heart disease Father   . Stroke Father   . Dementia Father   . Prostate cancer Father   . Diabetes Sister        3 sisters   . Heart disease Sister        1 sister CABG  . Breast cancer Sister        PTE pre & post Dx of ca  . Diabetes Paternal Grandfather   . Colon cancer Neg Hx     Social History Social History   Tobacco Use  . Smoking status: Never Smoker  . Smokeless tobacco: Never Used  Substance Use Topics  . Alcohol use: Yes    Comment: rare  . Drug use: No     Allergies   Iohexol; Iodinated diagnostic agents; Statins; Imdur [isosorbide nitrate]; Latex; Tape; and Verapamil   Review of Systems Review of Systems  Constitutional: Negative for fever.  HENT: Negative for sore throat.   Eyes: Negative for visual disturbance.  Respiratory: Negative for shortness of breath.   Cardiovascular: Negative for chest pain.  Gastrointestinal: Negative for abdominal pain.  Genitourinary: Negative for dysuria.  Musculoskeletal: Negative for neck pain.  Skin: Positive for color change (bruising of right shoulder). Negative for rash.  Neurological: Negative for numbness.     Physical Exam Updated Vital Signs BP 139/79   Pulse 94   Temp 98.7 F (37.1 C) (Oral)   Resp 18   SpO2 92%   Physical Exam  Constitutional: She appears well-developed and well-nourished. No distress.  HENT:  Head: Normocephalic and atraumatic.  Eyes: Conjunctivae are normal.  Neck: Neck supple.  Cardiovascular: Normal rate and regular rhythm.  No murmur heard. Pulmonary/Chest: Effort normal and breath sounds normal. No respiratory distress.  Abdominal: Soft. There is no tenderness.  Musculoskeletal: She exhibits tenderness. She exhibits no edema.  She has extensive bruising over her right anterior shoulder and she is tender to touch.  There is no particular warmth to it.  Landmarks seem normal.   Distal neurovascular intact.  Neurological: She is alert.  Skin: Skin is warm and dry.  Psychiatric: She has a normal  mood and affect.  Nursing note and vitals reviewed.    ED Treatments / Results  Labs (all labs ordered are listed, but only abnormal results are displayed) Labs Reviewed - No data to display  EKG None  Radiology Dg Shoulder Right  Result Date: 12/16/2017 CLINICAL DATA:  Right shoulder pain with history of prior rotator cuff surgery 3 weeks ago, initial encounter EXAM: RIGHT SHOULDER - 2+ VIEW COMPARISON:  11/19/2017. FINDINGS: Degenerative changes of the acromioclavicular joint are again noted. There are changes consistent with operative acromioplasty. A bony density is noted adjacent to the acromion superior to the humeral head which was not seen on prior chest x-ray from 11/19/2017. A residual bone fragment could not be totally excluded. No fracture or dislocation is seen. The underlying bony thorax is within normal limits. IMPRESSION: Postoperative changes as described. A small bony density is noted superior to the humeral head which may be residual from the recent acromioplasty. Electronically Signed   By: Inez Catalina M.D.   On: 12/16/2017 19:37    Procedures Procedures (including critical care time)  Medications Ordered in ED Medications  morphine 4 MG/ML injection 4 mg (4 mg Intravenous Given 12/16/17 1846)  HYDROmorphone (DILAUDID) injection 1 mg (1 mg Intravenous Given 12/16/17 1959)  predniSONE (DELTASONE) tablet 30 mg (30 mg Oral Given 12/16/17 2042)  ketorolac (TORADOL) 30 MG/ML injection 15 mg (15 mg Intravenous Given 12/16/17 2042)     Initial Impression / Assessment and Plan / ED Course  I have reviewed the triage vital signs and the nursing notes.  Pertinent labs & imaging results that were available during my care of the patient were reviewed by me and considered in my medical decision making (see chart for details).  Clinical Course as of Dec 17 1352  Nancy Fetter Dec 16, 2017  1932 Reinterviewed the patient to get a more clear picture of what happened.  She said she had this shoulder surgery on September 18.  Tuesday about 5 days ago she had some acute pain in the shoulder and then the bruising.  She saw Dr. Gladstone Lighter Friday and he told her to put it in a sling.  Today there was acute pain with no injury lifting or anything that she did patient can identify.   [MB]  2014 Discussed with Dr. Ihor Gully from [facility].  He is recommending giving the patient some Toradol and starting her on a prednisone taper at 30 mg.  The patient has an appointment tomorrow and if we can get better pain control I think she can be discharged.   [MB]  2053 Patient with much better pain control.  She is comfortable going home.  We will get her a sling here and she has an appointment with Dr. Dellis Filbert tomorrow.  She has pain meds left over from her surgery at home.   [MB]    Clinical Course User Index [MB] Hayden Rasmussen, MD     Final Clinical Impressions(s) / ED Diagnoses   Final diagnoses:  Acute pain of right shoulder    ED Discharge Orders         Ordered    predniSONE (DELTASONE) 5 MG tablet     12/16/17 2020           Hayden Rasmussen, MD 12/17/17 1355

## 2017-12-16 NOTE — ED Triage Notes (Signed)
Shoulder surgery Sept 18th, went to follow up on Friday a few hours ago it started hurting 10/10. Looks as though there is a deformity.

## 2017-12-16 NOTE — Discharge Instructions (Signed)
Were evaluated in the emergency department for severe right shoulder pain.  Your x-rays did not show any obvious fracture or dislocation.  We reviewed this with Dr. Alvan Dame from [facility] and he recommended you go on a prednisone taper.  Please continue your sling and follow-up with Dr. Gladstone Lighter tomorrow as scheduled.

## 2017-12-16 NOTE — ED Notes (Signed)
Bed: WA10 Expected date: 12/16/17 Expected time: 6:25 PM Means of arrival: Ambulance Comments: Shoulder popped, recent surgery

## 2017-12-19 DIAGNOSIS — M25511 Pain in right shoulder: Secondary | ICD-10-CM | POA: Diagnosis not present

## 2017-12-25 ENCOUNTER — Other Ambulatory Visit: Payer: Self-pay | Admitting: Pharmacist Clinician (PhC)/ Clinical Pharmacy Specialist

## 2017-12-25 MED ORDER — EVOLOCUMAB 140 MG/ML ~~LOC~~ SOAJ: 140.0000 mg | SUBCUTANEOUS | 4 refills | Status: AC

## 2017-12-27 DIAGNOSIS — H401131 Primary open-angle glaucoma, bilateral, mild stage: Secondary | ICD-10-CM | POA: Diagnosis not present

## 2018-01-07 ENCOUNTER — Other Ambulatory Visit: Payer: Self-pay | Admitting: Pharmacist Clinician (PhC)/ Clinical Pharmacy Specialist

## 2018-01-07 MED ORDER — LOSARTAN POTASSIUM 100 MG PO TABS: 100.0000 mg | ORAL_TABLET | Freq: Every day | ORAL | 3 refills | Status: DC

## 2018-01-08 DIAGNOSIS — Z947 Corneal transplant status: Secondary | ICD-10-CM | POA: Diagnosis not present

## 2018-01-08 DIAGNOSIS — H04123 Dry eye syndrome of bilateral lacrimal glands: Secondary | ICD-10-CM | POA: Diagnosis not present

## 2018-01-24 DIAGNOSIS — M25511 Pain in right shoulder: Secondary | ICD-10-CM | POA: Diagnosis not present

## 2018-01-31 DIAGNOSIS — M25511 Pain in right shoulder: Secondary | ICD-10-CM | POA: Diagnosis not present

## 2018-02-14 DIAGNOSIS — M25511 Pain in right shoulder: Secondary | ICD-10-CM | POA: Diagnosis not present

## 2018-02-18 DIAGNOSIS — H401131 Primary open-angle glaucoma, bilateral, mild stage: Secondary | ICD-10-CM | POA: Diagnosis not present

## 2018-02-19 DIAGNOSIS — M25511 Pain in right shoulder: Secondary | ICD-10-CM | POA: Diagnosis not present

## 2018-02-20 DIAGNOSIS — Z947 Corneal transplant status: Secondary | ICD-10-CM | POA: Diagnosis not present

## 2018-02-20 DIAGNOSIS — H16011 Central corneal ulcer, right eye: Secondary | ICD-10-CM | POA: Diagnosis not present

## 2018-02-20 DIAGNOSIS — H40003 Preglaucoma, unspecified, bilateral: Secondary | ICD-10-CM | POA: Diagnosis not present

## 2018-02-22 DIAGNOSIS — H16011 Central corneal ulcer, right eye: Secondary | ICD-10-CM | POA: Diagnosis not present

## 2018-02-25 DIAGNOSIS — H16001 Unspecified corneal ulcer, right eye: Secondary | ICD-10-CM | POA: Diagnosis not present

## 2018-02-25 DIAGNOSIS — Z947 Corneal transplant status: Secondary | ICD-10-CM | POA: Diagnosis not present

## 2018-02-26 DIAGNOSIS — M25511 Pain in right shoulder: Secondary | ICD-10-CM | POA: Diagnosis not present

## 2018-02-28 DIAGNOSIS — M25511 Pain in right shoulder: Secondary | ICD-10-CM | POA: Diagnosis not present

## 2018-03-05 DIAGNOSIS — H16001 Unspecified corneal ulcer, right eye: Secondary | ICD-10-CM | POA: Diagnosis not present

## 2018-03-14 DIAGNOSIS — H16001 Unspecified corneal ulcer, right eye: Secondary | ICD-10-CM | POA: Diagnosis not present

## 2018-03-18 DIAGNOSIS — M25511 Pain in right shoulder: Secondary | ICD-10-CM | POA: Diagnosis not present

## 2018-03-20 ENCOUNTER — Telehealth: Payer: Self-pay | Admitting: Cardiology

## 2018-03-20 NOTE — Telephone Encounter (Signed)
New Message   Patient c/o Palpitations:  High priority if patient c/o lightheadedness, shortness of breath, or chest pain  1) How long have you had palpitations/irregular HR/ Afib? Are you having the symptoms now? Since October and have been increasing and happening more frequently.  Patient took Nitroglycerin about a hour ago and it has stopped.   2) Are you currently experiencing lightheadedness, SOB or CP? Chest Pain and spreads to left arm and shoulder.  3) Do you have a history of afib (atrial fibrillation) or irregular heart rhythm? Irregular heart rhythem  4) Have you checked your BP or HR? (document readings if available): No  5) Are you experiencing any other symptoms? No

## 2018-03-20 NOTE — Telephone Encounter (Signed)
Pt states she has been experiencing more heart palpation the past 3 weeks with exertion. She also states today she had some left sided chest pain that radiated down her arm and up her neck. She denied any other symptoms but state she had to take 1 nitro. Pt denies active pain at this moment. Appointment made for pt to be seen in office tomorrow 2/6 at 930 with Kerin Ransom, [place]. Pt also advised to report to ED if symptoms reoccur. Pt verbalized understanding.

## 2018-03-21 ENCOUNTER — Ambulatory Visit (INDEPENDENT_AMBULATORY_CARE_PROVIDER_SITE_OTHER): Payer: Medicare Other | Admitting: Cardiology

## 2018-03-21 ENCOUNTER — Encounter: Payer: Self-pay | Admitting: Cardiology

## 2018-03-21 ENCOUNTER — Other Ambulatory Visit: Payer: Self-pay | Admitting: Cardiology

## 2018-03-21 VITALS — BP 140/88 | HR 67 | Ht 63.5 in | Wt 198.0 lb

## 2018-03-21 DIAGNOSIS — I10 Essential (primary) hypertension: Secondary | ICD-10-CM

## 2018-03-21 DIAGNOSIS — I2510 Atherosclerotic heart disease of native coronary artery without angina pectoris: Secondary | ICD-10-CM

## 2018-03-21 MED ORDER — NAPROXEN 500 MG PO TABS: 500.0000 mg | ORAL_TABLET | Freq: Two times a day (BID) | ORAL | 0 refills | Status: AC

## 2018-03-21 MED ORDER — ISOSORBIDE MONONITRATE ER 30 MG PO TB24: 30.0000 mg | ORAL_TABLET | Freq: Every day | ORAL | 0 refills | Status: DC

## 2018-03-21 MED ORDER — NITROGLYCERIN 0.4 MG SL SUBL: 0.4000 mg | SUBLINGUAL_TABLET | SUBLINGUAL | 3 refills | Status: DC | PRN

## 2018-03-21 MED ORDER — PANTOPRAZOLE SODIUM 40 MG PO TBEC: 40.0000 mg | DELAYED_RELEASE_TABLET | Freq: Every day | ORAL | 0 refills | Status: DC

## 2018-03-21 NOTE — Progress Notes (Signed)
03/21/2018 Connie Owens   1945/06/03  [id number]  Primary Physician Ann Held, DO Primary Cardiologist: Dr Percival Spanish  HPI: The patient is a pleasant 74 year old female with a history of coronary disease.  She had moderate multivessel coronary disease at catheterization in 2013.  There was LAD lesions, not amenable to PCI at the time.  The plan was medical therapy but to consider CABG if that failed.  She has done pretty well since.  She had a POET in March 2008 that was low risk.  She is in the office today with complaints of midsternal and left arm chest pain.  She says her symptoms have been present for several months but seem to be worse.  She is tells me that when she is cleaning house sometimes she has to stop because it hurts.  She has radiation to her left axillary area and upper left neck.  She denies any diaphoresis or nausea.  She does not admit to some shortness of breath with this.  She took a nitroglycerin with improvement in her symptoms recently.  On exam her chest wall was exquisitely tender to palpation.   Current Outpatient Medications  Medication Sig Dispense Refill  . aspirin 81 MG tablet Take 81 mg by mouth daily.    [name] azelastine (ASTELIN) 0.1 % nasal spray Place 2 sprays into both nostrils 2 (two) times daily. Use in each nostril as directed 30 mL 12  . brimonidine-timolol (COMBIGAN) 0.2-0.5 % ophthalmic solution Place 1 drop into the left eye 2 (two) times daily. Place 1 drop into left eye twice daily (morning & evening)    . carvedilol (COREG) 6.25 MG tablet Take 1 tablet (6.25 mg total) by mouth 2 (two) times daily. 180 tablet 3  . clobetasol ointment (TEMOVATE) 0.30 % Apply 1 application topically 2 (two) times daily. 90 g 1  . clopidogrel (PLAVIX) 75 MG tablet Take 1 tablet (75 mg total) by mouth daily. 90 tablet 3  . estradiol (ESTRACE) 1 MG tablet Take 1 mg by mouth at bedtime.     . Evolocumab (REPATHA SURECLICK) 092 MG/ML SOAJ Inject 140 mg into the  skin every 14 (fourteen) days. 6 pen 4  . gabapentin (NEURONTIN) 100 MG capsule 2 po tid for pain (Patient taking differently: 400 mg. 4 po tid for pain) 540 capsule 1  . latanoprost (XALATAN) 0.005 % ophthalmic solution Place 1 drop into both eyes at bedtime.     [name] losartan (COZAAR) 100 MG tablet Take 1 tablet (100 mg total) by mouth daily. 90 tablet 3  . nitroGLYCERIN (NITROSTAT) 0.4 MG SL tablet Place 1 tablet (0.4 mg total) under the tongue every 5 (five) minutes x 3 doses as needed for chest pain. 25 tablet 3  . nortriptyline (PAMELOR) 10 MG capsule Take 5 capsules (50 mg total) by mouth at bedtime. 450 capsule 3  . prednisoLONE acetate (PRED FORTE) 1 % ophthalmic suspension Place 1 drop into the left eye 2 (two) times daily.     [name] tiZANidine (ZANAFLEX) 4 MG tablet Take 4 mg by mouth every 8 (eight) hours as needed for muscle spasms.    [name] zolpidem (AMBIEN CR) 12.5 MG CR tablet Take 1 tablet (12.5 mg total) by mouth at bedtime as needed for sleep. 90 tablet 1  . isosorbide mononitrate (IMDUR) 30 MG 24 hr tablet Take 1 tablet (30 mg total) by mouth daily for 30 days. 30 tablet 0  . naproxen (NAPROSYN) 500 MG tablet Take  1 tablet (500 mg total) by mouth 2 (two) times daily with a meal for 7 days. 14 tablet 0  . pantoprazole (PROTONIX) 40 MG tablet Take 1 tablet (40 mg total) by mouth daily for 7 days. 7 tablet 0   No current facility-administered medications for this visit.     Allergies  Allergen Reactions  . Iohexol Rash    rash  . Iodinated Diagnostic Agents Other (See Comments) and Rash    Other Reaction: Other reaction  . Statins Other (See Comments)    Cause muscle aches/pains  . Imdur [Isosorbide Nitrate]     Severe headaches  . Latex Rash  . Tape Rash and Other (See Comments)    Plastic tap-blisters  . Verapamil Other (See Comments)    dizziness    Past Medical History:  Diagnosis Date  . Anxiety state    NOS  . Arthritis    osteo...right knee  . CAD (coronary artery  disease)    NSTEMI 8/13 => LHC 09/27/11: oLM 50%, dLM 30%, oLAD 60-70%, pLAD 50%, mLAD 70%, pD1 30%, pD2 40%, oRCA 25%, mRCA 40%, EF 50% with ant HK => LAD not amenable to PCI => med Rx  unless recurrent angina = > consider CABG  . Carpal tunnel syndrome   . Complication of anesthesia   . Corneal disorder   . Diverticular disease   . Dysmetabolic syndrome X   . GERD (gastroesophageal reflux disease)   . 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   [name] Headache, paroxysmal hemicrania, chronic 07/23/2012    Referral to BOTOX evaluation with dr Rexene Alberts or Krista Blue .  [name] Heart attack ([place]) 09/2011  . Hernia    hiatal  . HLD (hyperlipidemia)   . Hx of adenomatous colonic polyps   . Hypertension    essen. NOS  . Ischemic cardiomyopathy    a.  Echo 09/29/11: EF 30-35% with inferior, posterior, lateral AK, anterior severe HK, mild LVH, mild MR, PASP 40;  => b. follow up echo 10/13:  EF 55-60%, Gr 1 diast dysfn, mild MR  . Migraine headache without aura 07/23/2012    Left sided  Lamonte Sakai thought to be migrainous  , hypersensitivity to touch , photophobia ,  no nausea.   [name] PONV (postoperative nausea and vomiting)   . Shoulder fracture   . Sleep apnea    CPAP    Social History   Socioeconomic History  . Marital status: Widowed    Spouse name: Ruthann Cancer  . Number of children: 3  . Years of education: 24  . Highest education level: Not on file  Occupational History  . 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 Financial resource strain: Not on file  . Food insecurity:    Worry: Not on file    Inability: Not on file  . Transportation needs:    Medical: Not on file    Non-medical: Not on file  Tobacco Use  . Smoking status: Never Smoker  . Smokeless tobacco: Never Used  Substance and Sexual Activity  . Alcohol use: Yes    Comment: rare  . Drug use: No  . Sexual activity: Not on file    Lifestyle  . Physical activity:    Days per week: Not on file    Minutes per session: Not on file  . Stress: Not on file  Relationships  .  Social connections:    Talks on phone: Not on file    Gets together: Not on file    Attends religious service: Not on file    Active member of club or organization: Not on file    Attends meetings of clubs or organizations: Not on file    Relationship status: Not on file  . Intimate partner violence:    Fear of current or ex partner: Not on file    Emotionally abused: Not on file    Physically abused: Not on file    Forced sexual activity: Not on file  Other Topics Concern  . Not on file  Social History Narrative   Patient is widowed,([place] past 10/02/13) and lives at home with her husband.   Patient has three adult children.   Patient has a college education.   Patient is right-handed.   Patient drinks two cups of coffee daily.   Regular exercise   Patient is retired.     Family History  Problem Relation Age of Onset  . Breast cancer Mother   . Heart disease Father   . Stroke Father   . Dementia Father   . Prostate cancer Father   . Diabetes Sister        3 sisters   . Heart disease Sister        1 sister CABG  . Breast cancer Sister        PTE pre & post Dx of ca  . Diabetes Paternal Grandfather   . Colon cancer Neg Hx      Review of Systems: General: negative for chills, fever, night sweats or weight changes.  Cardiovascular: negative for chest pain, dyspnea on exertion, edema, orthopnea, palpitations, paroxysmal nocturnal dyspnea or shortness of breath Dermatological: negative for rash Respiratory: negative for cough or wheezing Urologic: negative for hematuria Abdominal: negative for nausea, vomiting, diarrhea, bright red blood per rectum, melena, or hematemesis Neurologic: negative for visual changes, syncope, or dizziness All other systems reviewed and are otherwise negative except as noted above.    Blood  pressure 140/88, pulse 67, height 5' 3.5" (1.613 m), weight 198 lb (89.8 kg).  General appearance: alert, cooperative, no distress and mildly obese Neck: no carotid bruit and no JVD Lungs: clear to auscultation bilaterally and kyphosis Heart: regular rate and rhythm Chest wall: tender to palpation Extremities: no edema Skin: Skin color, texture, turgor normal. No rashes or lesions Neurologic: Grossly normal  EKG NSR  ASSESSMENT AND PLAN:   Chest pain Some typical, some atypical features. Very tender to palpation   CAD (coronary artery disease) Multivessel CAD at cath 2013- medical Rx (not amenable to PCI). Low risk POET in 2018  Essential hypertension Fair control  Dyslipidemia, goal LDL below 70 On PCSK9   PLAN  Add 7 days of NSAID and  PPI.  Add Imdur.  I will see her back in two weeks.  She knows to go to the ED if she has severe chest pain not relieved with SL NTG. If she continues to have chest pain she may need cath.   Kerin Ransom PA-C 03/21/2018 10:26 AM

## 2018-03-21 NOTE — Assessment & Plan Note (Signed)
Fair control.

## 2018-03-21 NOTE — Assessment & Plan Note (Signed)
On PCSK9

## 2018-03-21 NOTE — Assessment & Plan Note (Signed)
Some typical, some atypical features. Very tender to palpation

## 2018-03-21 NOTE — Assessment & Plan Note (Signed)
Multivessel CAD at cath 2013- medical Rx (not amenable to PCI). Low risk POET in 2018

## 2018-03-21 NOTE — Patient Instructions (Addendum)
Medication Instructions:  START Naprosyn 500mg  take 1 tablet twice a day for 7 days START Protonix 40mg  take 1 tablet once a day  START Imdur for the first three days take half a tablet once a day then START 1 whole tablet once a day  Nitrostat has been refilled If you need a refill on your cardiac medications before your next appointment, please call your pharmacy.   Lab work: None  If you have labs (blood work) drawn today and your tests are completely normal, you will receive your results only by: [name] MyChart Message (if you have MyChart) OR . A paper copy in the mail If you have any lab test that is abnormal or we need to change your treatment, we will call you to review the results.  Testing/Procedures: None   Follow-Up: At [facility], you and your health needs are our priority.  As part of our continuing mission to provide you with exceptional heart care, we have created designated Provider Care Teams.  These Care Teams include your primary Cardiologist (physician) and Advanced Practice Providers (APPs -  Physician Assistants and Nurse Practitioners) who all work together to provide you with the care you need, when you need it. Your physician recommends that you schedule a follow-up appointment in: 2-3 weeks with Kerin Ransom, PA-C  Any Other Special Instructions Will Be Listed Below (If Applicable).

## 2018-03-27 DIAGNOSIS — T86841 Corneal transplant failure: Secondary | ICD-10-CM | POA: Diagnosis not present

## 2018-03-29 DIAGNOSIS — R195 Other fecal abnormalities: Secondary | ICD-10-CM | POA: Insufficient documentation

## 2018-03-29 DIAGNOSIS — N952 Postmenopausal atrophic vaginitis: Secondary | ICD-10-CM | POA: Insufficient documentation

## 2018-04-04 ENCOUNTER — Ambulatory Visit: Payer: Medicare Other | Admitting: Cardiology

## 2018-04-05 ENCOUNTER — Telehealth: Payer: Self-pay

## 2018-04-05 DIAGNOSIS — E785 Hyperlipidemia, unspecified: Secondary | ICD-10-CM

## 2018-04-05 NOTE — Telephone Encounter (Signed)
Called pt to schedule labs for repatha cholesterol check pt compliant to come in next week

## 2018-04-05 NOTE — Telephone Encounter (Signed)
-----   Message from [place], [place] sent at 04/02/2018  4:04 PM EST ----- Regarding: order lipids pa not needed yearly labs

## 2018-04-09 DIAGNOSIS — E785 Hyperlipidemia, unspecified: Secondary | ICD-10-CM | POA: Diagnosis not present

## 2018-04-09 LAB — LIPID PANEL
CHOLESTEROL TOTAL: 132 mg/dL (ref 100–199)
Chol/HDL Ratio: 2.2 ratio (ref 0.0–4.4)
HDL: 59 mg/dL (ref >39)
LDL Calculated: 45 mg/dL (ref 0–99)
Triglycerides: 141 mg/dL (ref 0–149)
VLDL Cholesterol Cal: 28 mg/dL (ref 5–40)

## 2018-04-11 DIAGNOSIS — Z9889 Other specified postprocedural states: Secondary | ICD-10-CM | POA: Diagnosis not present

## 2018-04-11 DIAGNOSIS — M25511 Pain in right shoulder: Secondary | ICD-10-CM | POA: Diagnosis not present

## 2018-04-16 ENCOUNTER — Encounter: Payer: Self-pay | Admitting: Neurology

## 2018-04-17 ENCOUNTER — Encounter: Payer: Self-pay | Admitting: Neurology

## 2018-04-17 ENCOUNTER — Ambulatory Visit (INDEPENDENT_AMBULATORY_CARE_PROVIDER_SITE_OTHER): Payer: Medicare Other | Admitting: Neurology

## 2018-04-17 VITALS — BP 126/72 | HR 67 | Ht 63.5 in | Wt 199.0 lb

## 2018-04-17 DIAGNOSIS — G44229 Chronic tension-type headache, not intractable: Secondary | ICD-10-CM

## 2018-04-17 DIAGNOSIS — G4700 Insomnia, unspecified: Secondary | ICD-10-CM

## 2018-04-17 DIAGNOSIS — Z9989 Dependence on other enabling machines and devices: Secondary | ICD-10-CM | POA: Diagnosis not present

## 2018-04-17 DIAGNOSIS — G4733 Obstructive sleep apnea (adult) (pediatric): Secondary | ICD-10-CM | POA: Diagnosis not present

## 2018-04-17 DIAGNOSIS — E8881 Metabolic syndrome: Secondary | ICD-10-CM | POA: Diagnosis not present

## 2018-04-17 DIAGNOSIS — H04123 Dry eye syndrome of bilateral lacrimal glands: Secondary | ICD-10-CM | POA: Diagnosis not present

## 2018-04-17 DIAGNOSIS — I2510 Atherosclerotic heart disease of native coronary artery without angina pectoris: Secondary | ICD-10-CM

## 2018-04-17 DIAGNOSIS — I2584 Coronary atherosclerosis due to calcified coronary lesion: Secondary | ICD-10-CM | POA: Diagnosis not present

## 2018-04-17 DIAGNOSIS — I10 Essential (primary) hypertension: Secondary | ICD-10-CM | POA: Diagnosis not present

## 2018-04-17 MED ORDER — TRAZODONE HCL 50 MG PO TABS: 50.0000 mg | ORAL_TABLET | Freq: Every evening | ORAL | 1 refills | Status: DC | PRN

## 2018-04-17 MED ORDER — NORTRIPTYLINE HCL 10 MG PO CAPS: ORAL_CAPSULE | ORAL | 3 refills | Status: DC

## 2018-04-17 MED ORDER — NORTRIPTYLINE HCL 10 MG PO CAPS: ORAL_CAPSULE | ORAL | 3 refills | Status: AC

## 2018-04-17 NOTE — Addendum Note (Signed)
Addended by: Larey Seat on: 04/17/2018 11:37 AM   Modules accepted: Orders

## 2018-04-17 NOTE — Progress Notes (Addendum)
[facility]                  SLEEP MEDICINE CLINIC   PATIENT: Connie Owens DOB: April 18, 1945   REASON FOR VISIT: Follow-up for migraine and OSA on CPAP.    HISTORY FROM: patient alone      HISTORY 04/30/14: Connie Owens is a 73 year old female with history of obstructive sleep Apnea and migraines. She returns today for a 90 day compliance download. She brought her machine with her today and the reports shows an AHI of 0.3at 6 cm of water, uses her machine for 5 hours and 57 minutes a night, with 100 % compliance. She uses her machine for greater than 4 hours 74 out of 90 days with compliance of 82%. Her Epworth score is 7 points was previously 4 points. Her fatigue severity score is 41 was previously 53.. Patient reports that she gets about 9 hours of sleep a night. She goes to bed around 11PM and arises at 8:15AM. She denies having trouble falling asleep or staying a sleep. States that the she gets up about 1-2 times a night to urinate. Overall patient feels that CPAP has improved her sleepiness and fatigue. The patient states that her migraines have improved. She continues to take nortriptyline 30 mg at bedtime. She has approximately 1-2 headaches per week. She will occassionally have sharp pain that is on the left side of the head that last for seconds and then will resolve. Overall she feels that her headaches have improved. Patient continues to suffer from anxiety and depression due to the loss of her husband. They were married 53 years.  Interval history from 03/22/2017CD Connie Owens is here today doing better in terms of her migraine headaches without aura and she seems to tolerate amitriptyline very well, but she does have morning dry mouth and she has a history of glaucoma which needs to be considered. In addition we are here to have a compliance visit for CPAP and her overall compliance is 87% but she had several days was less than 4 hours of nightly use. She still  needs Ambien , now 73 month after the death of her spouse, this may helped with headache.  Interval history from 05/04/2016, The pleasure of seeing Connie Owens today for revisit or has been more sparingly using Excedrin Migraine and still has headaches. She does not report excessive daytime sleepiness given that she endorsed the Epworth score at 7 points, fatigue severity at 25 points, and geriatric depression score at only 1 out of 15 points. She is using CPAP for the treatment of obstructive sleep apnea at a very low pressure of 6 cm water with a residual AHI of 0.5 and a compliance of 75% 100% 4 days 75% 4 hours of daily use was 5 hours 58 minutes on average. The needs to be no adjustments made to her CPAP except that I would appreciate if she can try to use it at least 4 hours each night. CPAP has helped her headaches. She has signed up with a local gym and is trying to lose weight by diet and exercise.  Interval history from 05 April 2017.   I have the pleasure of meeting with Connie Owens today who was last seen in August 2018 by my nurse practitioner Vaughan Browner.  The patient reports that her CPAP compliance has dropped because the ear feels too hot and steamy to be comfortable.  We will address this today and  change her humidifier settings, I also will see if she has a heated coil in her tubing system which may add additional heat and humidity.  Secondary she reports a bandlike headache that occurs whenever she lays down.  There is a tenderness around the crown of the head beginning at the high temporal level to the parietal into the occipital region.  This sensation causes her to turn over a lot of the restless.  She qualified this is a soreness and discomfort rather than a stabbing pain, there is no burning there is no sharp quality to the pain she does not feel this pain when she sits walks or is active in daytime only in bed. 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 [facility]. Her cardiac function has recovered- CAD with MI in 2014, EF 60% I-09-2017.  Dr. Percival Spanish will follow up in late March.   04-17-2018, RV I have the pleasure of seeing Connie Owens today, is a 73 year old Caucasian right-handed female patient who is followed here for sleep apnea and compliant with CPAP treatment.  She is also followed by Dr. Percival Spanish her cardiologist.  He has been very happy with her cholesterol control.  She underwent his shoulder surgery in September 2019 and has recovered well.  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  She has also lost a few pounds which helps her obstructive sleep apnea as well.    Today's compliance shows 70% for over 4 hours with an average of 5 hours 28 minutes, she is using an S9 AutoSet but is not auto titration capable, has a  set pressure at 6 cm and expiratory relief pressure at 3 cmH2O there is a residual AHI of 1.1/h. Some nights she has major air leaks which increase the apnea count by unknown events 0.5/h.  Overall her apnea control is excellent. Machine is 73 years old, and she is a mild apnea patient - she likes to sleep with CPAP as she continues to feel better with it's use at night, but she gets entangled.   Family history- father was alcoholic and died of dementia, mother alive at 17, cognitively intact, gait disorder. Patient has  2 brothers and 3 sisters, healthy- 3 sisters have diabetes, 2 of them with type 1! Fuchs dystrophy. Mother and daughter with insomnia.    REVIEW OF SYSTEMS: Full 14 system review of systems performed and notable only for those listed, all others are neg:   Corneal transplant worked but she remains vision impaired. ,  OSA on CPAP, headaches- cranial pain , not always migraines ( no nausea) .  Obesity is improved - losing weight.   Epworth sleepiness score endorsed at 8 points which is tolerable, fatigue severity  24 points.   ALLERGIES: Allergies  Allergen Reactions  . Iohexol Rash    rash  . Iodinated Diagnostic Agents Other (See Comments) and Rash    Other Reaction: Other reaction  . Statins Other (See Comments)    Cause muscle aches/pains  . Imdur [Isosorbide Nitrate]     Severe headaches  . Latex Rash  . Tape Rash and Other (See Comments)    Plastic tap-blisters  . Verapamil Other (See Comments)    dizziness    HOME MEDICATIONS: Outpatient Medications Prior to Visit  Medication Sig Dispense Refill  . aspirin 81 MG tablet Take 81 mg by  mouth daily.    [name] azelastine (ASTELIN) 0.1 % nasal spray Place 2 sprays into both nostrils 2 (two) times daily. Use in each nostril as directed 30 mL 12  . brimonidine-timolol (COMBIGAN) 0.2-0.5 % ophthalmic solution Place 1 drop into the left eye 2 (two) times daily. Place 1 drop into left eye twice daily (morning & evening)    . carvedilol (COREG) 6.25 MG tablet Take 1 tablet (6.25 mg total) by mouth 2 (two) times daily. 180 tablet 3  . clobetasol ointment (TEMOVATE) 8.58 % Apply 1 application topically 2 (two) times daily. 90 g 1  . clopidogrel (PLAVIX) 75 MG tablet Take 1 tablet (75 mg total) by mouth daily. 90 tablet 3  . estradiol (ESTRACE) 1 MG tablet Take 1 mg by mouth at bedtime.     . Evolocumab (REPATHA SURECLICK) 850 MG/ML SOAJ Inject 140 mg into the skin every 14 (fourteen) days. 6 pen 4  . gabapentin (NEURONTIN) 100 MG capsule 2 po tid for pain (Patient taking differently: 400 mg. 4 po tid for pain) 540 capsule 1  . isosorbide mononitrate (IMDUR) 30 MG 24 hr tablet TAKE 1 TABLET(30 MG) BY MOUTH DAILY 92 tablet 1  . latanoprost (XALATAN) 0.005 % ophthalmic solution Place 1 drop into both eyes at bedtime.     . nitroGLYCERIN (NITROSTAT) 0.4 MG SL tablet Place 1 tablet (0.4 mg total) under the tongue every 5 (five) minutes x 3 doses as needed for chest pain. 25 tablet 3  . nortriptyline (PAMELOR) 10 MG capsule Take 5 capsules (50 mg total) by  mouth at bedtime. 450 capsule 3  . prednisoLONE acetate (PRED FORTE) 1 % ophthalmic suspension Place 1-2 drops into both eyes daily. 2 drops right eye daily and l drop left eye daily    . tiZANidine (ZANAFLEX) 4 MG tablet Take 4 mg by mouth every 8 (eight) hours as needed for muscle spasms.    [name] zolpidem (AMBIEN CR) 12.5 MG CR tablet Take 1 tablet (12.5 mg total) by mouth at bedtime as needed for sleep. 90 tablet 1  . pantoprazole (PROTONIX) 40 MG tablet TAKE 1 TABLET(40 MG) BY MOUTH DAILY FOR 7 DAYS 90 tablet 0  . losartan (COZAAR) 100 MG tablet Take 1 tablet (100 mg total) by mouth daily. 90 tablet 3   No facility-administered medications prior to visit.     PAST MEDICAL HISTORY: Past Medical History:  Diagnosis Date  . Anxiety state    NOS  . Arthritis    osteo...right knee  . CAD (coronary artery disease)    NSTEMI 8/13 => LHC 09/27/11: oLM 50%, dLM 30%, oLAD 60-70%, pLAD 50%, mLAD 70%, pD1 30%, pD2 40%, oRCA 25%, mRCA 40%, EF 50% with ant HK => LAD not amenable to PCI => med Rx  unless recurrent angina = > consider CABG  . Carpal tunnel syndrome   . Complication of anesthesia   . Corneal disorder   . Diverticular disease   . Dysmetabolic syndrome X   . GERD (gastroesophageal reflux disease)   . 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   [name] Headache, paroxysmal hemicrania, chronic 07/23/2012    Referral to BOTOX evaluation with dr Rexene Alberts or Krista Blue .  [name] Heart attack ([place]) 09/2011  . Hernia    hiatal  . HLD (hyperlipidemia)   . Hx of adenomatous colonic polyps   . Hypertension    essen. NOS  . Ischemic cardiomyopathy  a.  Echo 09/29/11: EF 30-35% with inferior, posterior, lateral AK, anterior severe HK, mild LVH, mild MR, PASP 40;  => b. follow up echo 10/13:  EF 55-60%, Gr 1 diast dysfn, mild MR  . Migraine headache without aura 07/23/2012    Left sided  Lamonte Sakai thought to be migrainous  , hypersensitivity to touch ,  photophobia ,  no nausea.   [name] PONV (postoperative nausea and vomiting)   . Shoulder fracture   . Sleep apnea    CPAP    PAST SURGICAL HISTORY: Past Surgical History:  Procedure Laterality Date  . ABDOMINAL HYSTERECTOMY    . ABDOMINOPLASTY  10/23/2016  . ankle cystectomy Right   . APPENDECTOMY    . BRACHIOPLASTY Bilateral 05/02/2016  . BREAST EXCISIONAL BIOPSY Right 02/15/1994  . BREAST LUMPECTOMY Right 1996  . BUNIONECTOMY Right   . CARDIAC CATHETERIZATION  09/28/2011   Dr Acie Fredrickson(  to see Dr Ron Parker)  . CHOLECYSTECTOMY    . COLONOSCOPY W/ POLYPECTOMY    . 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 CORNEAL TRANSPLANT Left    x1  . HEMORRHOID SURGERY  12/01/2010, 2014   [place] hemorrhoid ligation/pexy  . LEFT HEART CATHETERIZATION WITH CORONARY ANGIOGRAM N/A 09/28/2011   Procedure: LEFT HEART CATHETERIZATION WITH CORONARY ANGIOGRAM;  Surgeon: Minus Breeding, MD;  Location[facility] C[facility] LAB;  Service: Cardiovascular;  Laterality: N/A;  . lumbar spur removed    . rectocele surgery    . SHOULDER OPEN ROTATOR CUFF REPAIR Right 10/31/2017   Procedure: Right rotator cuff repair with graft and anchor, acromioplasty;  Surgeon: Latanya Maudlin, MD;  Location: [facility] ORS;  Service: Orthopedics;  Laterality: Right;  . 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 [facility].   Her cardiac function has recovered- CAD with MI in 2014, EF 60% I-09-2017.  Dr Percival Spanish will follow up in late March.   Skin reduction surgery after massive weight loss, tummy tuck 11-02-2016.   FAMILY HISTORY: Family History  Problem Relation Age of Onset  . Breast cancer Mother   . Heart disease Father   . Stroke Father   . Dementia Father   . Prostate cancer Father   . Diabetes Sister        3 sisters   . Heart disease Sister        1 sister CABG  . Breast cancer Sister         PTE pre & post Dx of ca  . Diabetes Paternal Grandfather   . Colon cancer Neg Hx     SOCIAL HISTORY: Social History   Socioeconomic History  . Marital status: Widowed    Spouse name: Ruthann Cancer  . Number of children: 3  . Years of education: 57  . Highest education level: Not on file  Occupational History  . 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 Financial resource strain: Not on file  . Food insecurity:    Worry: Not on file    Inability: Not on file  . Transportation needs:    Medical: Not on file    Non-medical: Not on file  Tobacco Use  . Smoking status: Never Smoker  . Smokeless tobacco: Never Used  Substance and Sexual Activity  . Alcohol use: Yes  Comment: rare  . Drug use: No  . Sexual activity: Not on file  Lifestyle  . Physical activity:    Days per week: Not on file    Minutes per session: Not on file  . Stress: Not on file  Relationships  . Social connections:    Talks on phone: Not on file    Gets together: Not on file    Attends religious service: Not on file    Active member of club or organization: Not on file    Attends meetings of clubs or organizations: Not on file    Relationship status: Not on file  . Intimate partner violence:    Fear of current or ex partner: Not on file    Emotionally abused: Not on file    Physically abused: Not on file    Forced sexual activity: Not on file  Other Topics Concern  . Not on file  Social History Narrative   Patient is widowed,([place] past 10/02/13) and lives at home with her husband.   Patient has three adult children.   Patient has a college education.   Patient is right-handed.   Patient drinks two cups of coffee daily.   Regular exercise   Patient is retired.     PHYSICAL EXAM  Vitals:   04/17/18 1051  BP: 126/72  Pulse: 67  Weight: 199 lb (90.3 kg)  Height: 5' 3.5" (1.613 m)   Body mass index is 34.7 kg/m.   Mallampati 3, neck 15  inches.   Generalized: Well developed, in no acute distress , obese caucasian female. No temporal artery induration. No occipital tenderness. Well groomed.   Neurological examination  Mentation: Alert oriented to time, place, history taking. Follows all commands, her speech and language are fluent Cranial nerve : Pupils unequal, disrounded- but reactive to light. Wears contacts.  Left pupil is slit-like, 5-6 mm, right 4 mm and tear shaped.   No nystagmus, no diplopia.  Extraocular movements were full, visual field were full on confrontational test. Facial sensation and strength were normal. Pink tongue, non fasciculating, and without tremor,  in midline. Head turning intact, normal and symmetric.  Motor:  5 /5 strength of all 4 extremities, symmetric motor tone is noted throughout. No cog-wheeling, no waxy resistance , no spasticity, no atrophy.  Right knee replacement (helped pain) now almost normal ROM, soft tissue pain.   Sensory:  intact to soft touch, pin prick  on all 4 extremities.  Coordination:  finger-nose-intact, no tremor, no dysmetria. Good handwriting, no problems with buttons, hook closures, etc.  Gait and station: Gait narrow based- turns with 3 steps. with normal erect posture.   Reflexes: Deep tendon reflexes are symmetric bilaterally.  , DIAGNOSTIC DATA (LABS, IMAGING, TESTING) - I reviewed patient records, labs, notes, testing and imaging myself where available. CPAP is an [employer] - 73% compliance.  Air leaks sometimes into her dry eyes, and she is on amitriptyline denies memory loss, but most evident side effect is dry mouth, dry eye.    Epworth sleepiness score endorsed at 3 points which is tolerable, fatigue severity 32 points.   ASSESSMENT AND PLAN  73 y.o. year old female  has a past medical history of CAD (coronary artery disease); Ischemic cardiomyopathy; Corneal transplant and dry eyes; tension and Migraine headache without aura  (07/23/2012); Headache, paroxysmal hemicrania, chronic (07/23/2012); MI 2013 and Glaucoma (increased eye pressure) (07/30/2013). here To follow-up for her   1) tension headche , sensitive to  touch, throbbing and sore- no nausea. Treated with amytriptiline.  2) OSA on CPAP, still needs better compliance- I reduced her humifier settings. download reviewed and dictated.  She is due for a new machine but ai also want to retest her for the need of CPAP- she is losing weight and she uses only 6 cm water- she may not need CPAP any longer.  3) Status post weight loss - skin reduction. She is on Repatha , has normal cholesterol for the first time in her live. 4) repeat corneal rejection-  transplant  5) insomnia - sleep hygiene discussed, referral for cognitive behavior therapy offered.    PLAN: Nortriptyline capsules at night, she uses a up to 3 ,is allowed up to 5. .  Use CPAP every night, no adjustment needed. Has improved compliance to 73%. New machine needed.   Weight loss - the use of nortriptyline may interfere with her weight loss plans. It has helped her to sleep deeper it has helped her with the headache frequency. I hope that he could reduce it from 5 capsules at night to 4 or 3 and see if this is working to reduce her appetite. The headaches have improved under CPAP use and since she has weaned off Excedrin. She understood the mechanism of rebound headaches.   Follow-up with NP after repeat sleep study - later this summer.   [facility] [address], [place], [place] [address] [phone]

## 2018-04-17 NOTE — Addendum Note (Signed)
Addended by: Larey Seat on: 04/17/2018 11:32 AM   Modules accepted: Orders

## 2018-04-23 ENCOUNTER — Ambulatory Visit: Payer: Medicare Other | Admitting: Cardiology

## 2018-05-01 ENCOUNTER — Telehealth: Payer: Self-pay | Admitting: *Deleted

## 2018-05-01 IMAGING — XA DG MYELOGRAPHY LUMBAR INJ LUMBOSACRAL
13 of 19 series · 13 of 19 positions shown · non-contrast
Comparison: MRI lumbar spine from [facility], 09/02/2016.

CLINICAL DATA: Low back pain. RIGHT leg pain. Symptoms for 5
months.
TECHNIQUE: Contiguous axial images were obtained through the Lumbar spine after
the intrathecal infusion of infusion. Coronal and sagittal
reconstructions were obtained of the axial image sets.

[Series 1: w lumbar spine ap · 0.15mm/px · 1 of 1 slices shown]
[im 1/1]
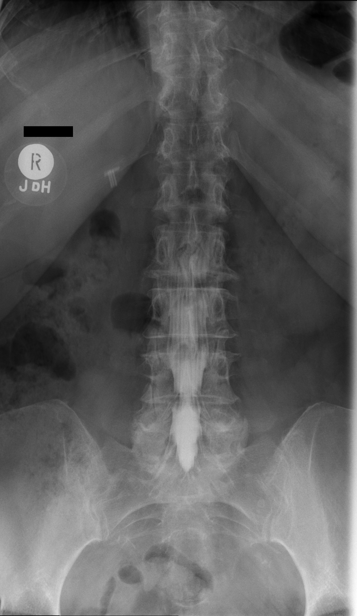

[Series 2: w lumbar spine lat · 0.15mm/px · 1 of 1 slices shown]
[im 1/1]
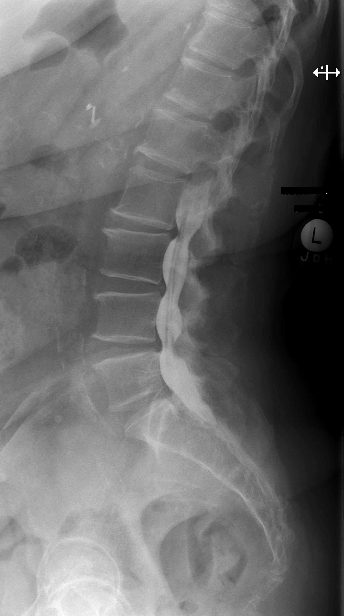

[Series 2: vasc standard · 1 of 1 slices shown (1 of 11)]
[im 1/1]
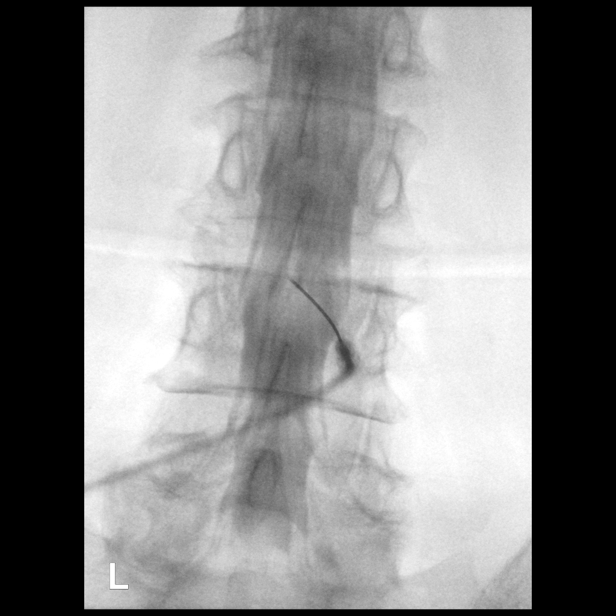

[Series 3: vasc standard · 1 of 1 slices shown (2 of 11)]
[im 1/1]
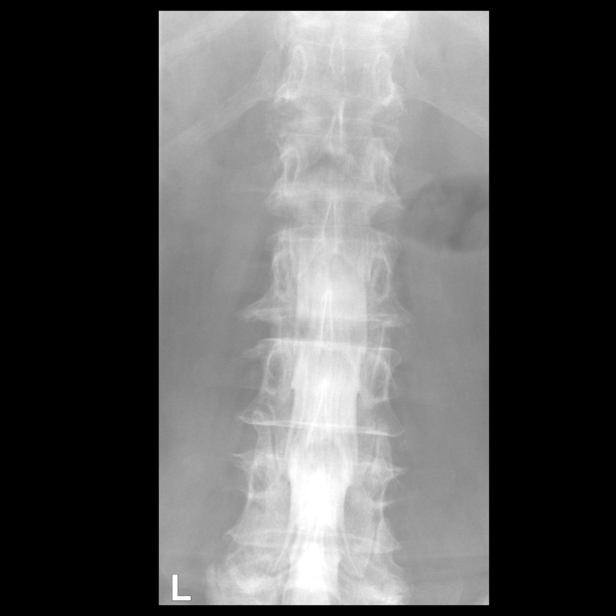

[Series 4: vasc standard · 1 of 1 slices shown (3 of 11)]
[im 1/1]
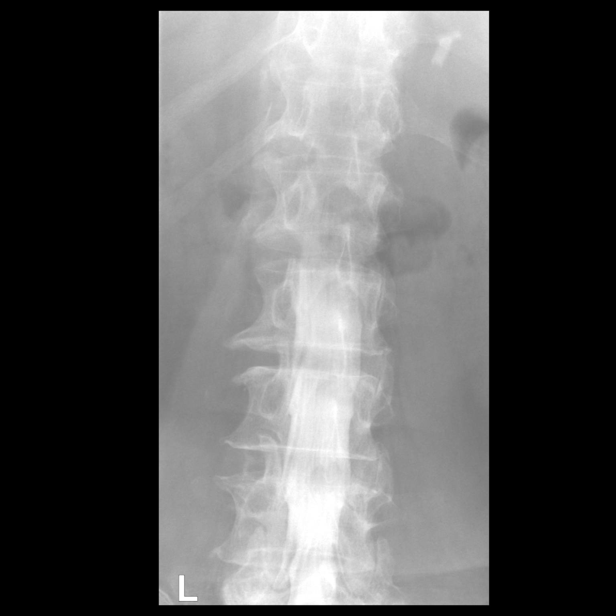

[Series 5: vasc standard · 1 of 1 slices shown (4 of 11)]
[im 1/1]
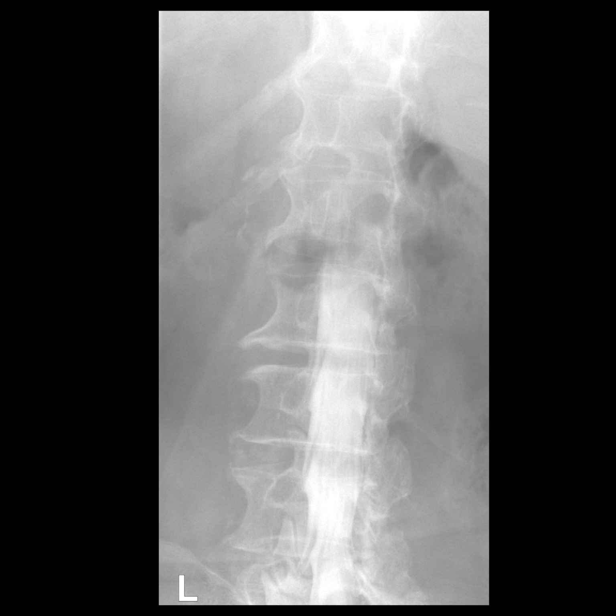

[Series 6: vasc standard · 1 of 1 slices shown (5 of 11)]
[im 1/1]
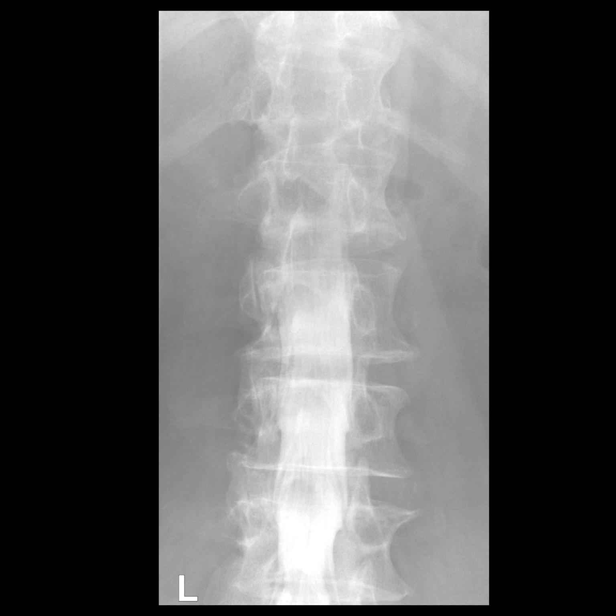

[Series 7: vasc standard · 1 of 1 slices shown (6 of 11)]
[im 1/1]
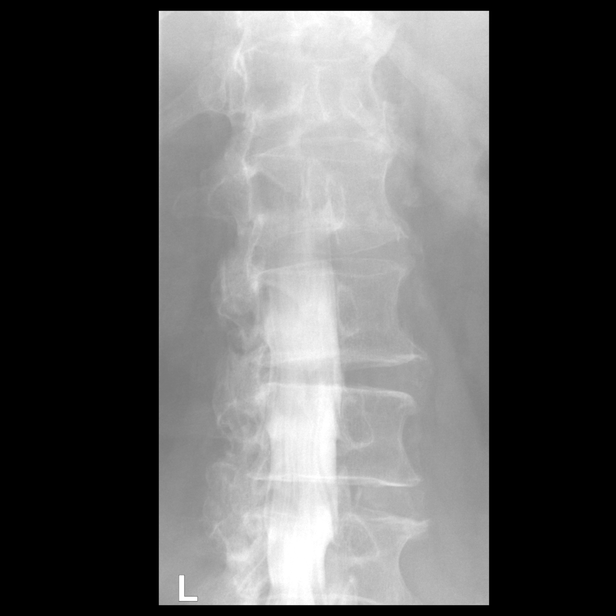

[Series 9: vasc standard · 1 of 1 slices shown (7 of 11)]
[im 1/1]
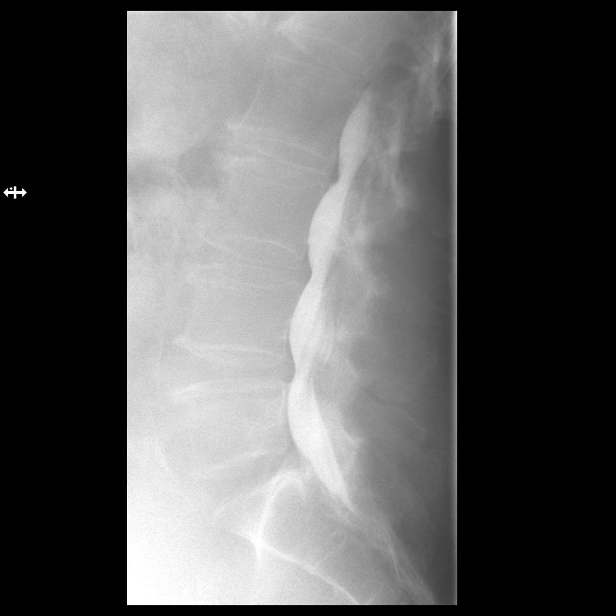

[Series 10: vasc standard · 1 of 1 slices shown (8 of 11)]
[im 1/1]
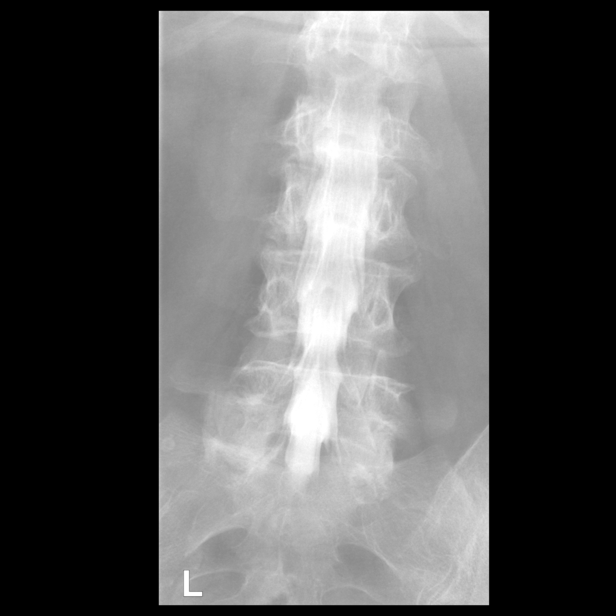

[Series 12: vasc standard · 1 of 1 slices shown (9 of 11)]
[im 1/1]
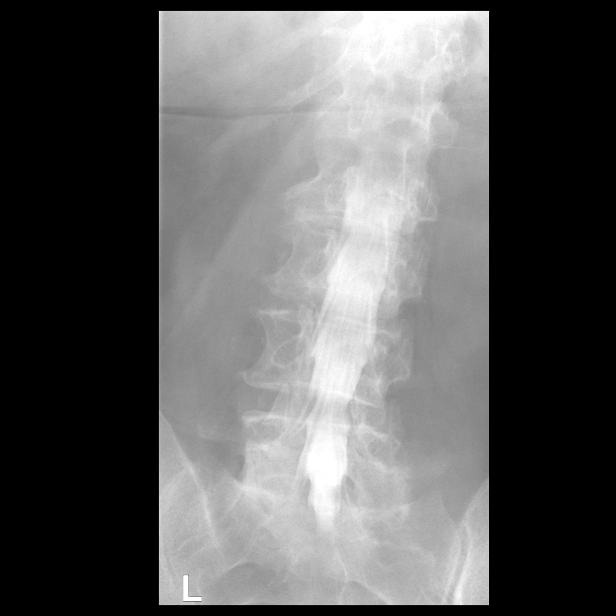

[Series 13: vasc standard · 1 of 1 slices shown (10 of 11)]
[im 1/1]
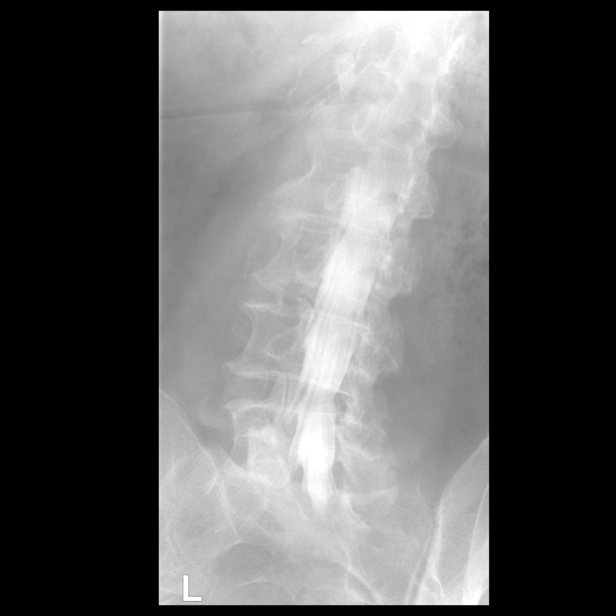

[Series 15: vasc standard · 1 of 1 slices shown (11 of 11)]
[im 1/1]
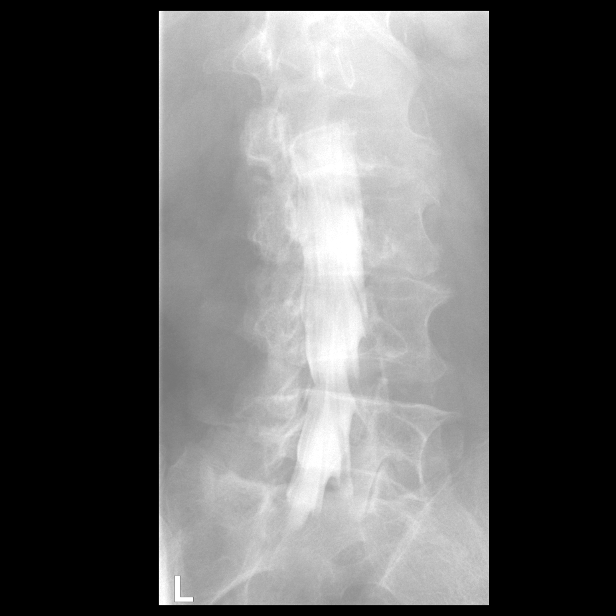

[13 of 19 positions shown; findings below may reference images not displayed]

EXAM:
LUMBAR MYELOGRAM

FLUOROSCOPY TIME:  41 seconds corresponding to a Dose Area Product
of 326.77 ?Gy*m2

PROCEDURE:
After thorough discussion of risks and benefits of the procedure
including bleeding, infection, injury to nerves, blood vessels,
adjacent structures as well as headache and CSF leak, written and
oral informed consent was obtained. Consent was obtained by Dr. Damjan
Forester. Time out form was completed.

Patient was positioned prone on the fluoroscopy table. Local
anesthesia was provided with 1% lidocaine without epinephrine after
prepped and draped in the usual sterile fashion. Puncture was
performed at L3-L4 using a 3 1/2 inch 22-gauge spinal needle via
midline approach. Using a single pass through the dura, the needle
was placed within the thecal sac, with return of clear CSF. 15 mL of
Isovue-M 200 was injected into the thecal sac, with normal
opacification of the nerve roots and cauda equina consistent with
free flow within the subarachnoid space.

I personally performed the lumbar puncture and administered the
intrathecal contrast. I also personally supervised acquisition of
the myelogram images.
FINDINGS: LUMBAR MYELOGRAM FINDINGS:

Good opacification lumbar subarachnoid space. Shallow ventral
defects at L2-3, L3-4, and L4-5. Mild stenosis at L4-5 with
effacement, but not cut off, of the L5 nerve roots.

Standing flexion extension demonstrates increasing subarticular zone
narrow at L4-5 with patient standing. There is RIGHT greater than
LEFT L5 nerve root cut off.

Standing lateral flexion extension views demonstrate anatomic
alignment throughout flexion and extension, only physiologic
movement, without dynamic instability. The patient does have
increased stenosis at L4-5 and L2-3 in extension, related to
ligamentum flavum infolding and prominence of the ventral disc
material.

CT LUMBAR MYELOGRAM FINDINGS:

Segmentation: Standard.

Alignment:  No significant anterolisthesis or retrolisthesis.

Vertebrae: No worrisome osseous lesion.

Conus medullaris: Normal in size and location.

Paraspinal tissues: No evidence for hydronephrosis or paravertebral
mass.

Disc levels:

L1-L2:  Normal.

L2-L3: Central and leftward protrusion. Mild facet arthropathy.
Ligamentum flavum infolding. Slight subarticular zone narrowing and
foraminal zone narrowing on the LEFT, without definite L2 or L3
nerve root impingement.

L3-L4: Broad-based disc protrusion, partially calcified, extends to
both neural foramina and beyond. Mild facet arthropathy and
ligamentum flavum hypertrophy. Mild stenosis without L4 or L3 nerve
root impingement.

L4-L5: Broad-based central protrusion. Posterior element hypertrophy
affects the facets and ligamentum flavum. Prominent ligamentum
flavum infolding on sagittal reformatted images. BILATERAL
subarticular zone narrowing, worse on the RIGHT. Mild foraminal
narrowing, not clearly compressive.

L5-S1: Annular bulge. Moderately advanced facet arthropathy. No
definite impingement.

Compared with recent MR, good general agreement.
IMPRESSION: LUMBAR MYELOGRAM IMPRESSION:

Lumbar myelogram demonstrates BILATERAL subarticular zone narrowing
at L4-5 affecting the RIGHT greater than LEFT L5 nerve roots.

No significant dynamic instability on standing flexion extension
views, however there is increased stenosis in lateral standing
extension due to ligamentum flavum infolding, and RIGHT greater than
LEFT L5 nerve root cut off is more pronounced on the AP standing
radiograph.

CT LUMBAR MYELOGRAM IMPRESSION:

Broad-based disc protrusion at L4-5 is accompanied by facet
arthropathy and ligamentum flavum hypertrophy, with prominent
infolding. BILATERAL subarticular zone narrowing is worse on the
RIGHT, affecting both L5 nerve roots.

Lumbar spondylosis at other levels, not clearly compressive.

## 2018-05-01 NOTE — Telephone Encounter (Signed)
Patient stopped by the office.  Wants to discuss the medication Dr. Brett Fairy prescribed at her last visit.  States she cant take it. It makes her symptoms worse.  Please call to discuss.

## 2018-05-01 NOTE — Telephone Encounter (Signed)
Called the patient and left a message for the patient to call back with concerns or send me a message on my chart.  Patient has been on nortriptyline for some time so I am assuming the medication that she is referring to is trazodone.   If patient calls back please ask her if its trazodone that she is referring to and to inform what symptoms she is having since taking?  I will review her message with Dr Brett Fairy and call back with her recommendation.

## 2018-05-02 MED ORDER — ZOLPIDEM TARTRATE 10 MG PO TABS: 5.0000 mg | ORAL_TABLET | Freq: Every evening | ORAL | 0 refills | Status: DC | PRN

## 2018-05-02 NOTE — Telephone Encounter (Signed)
Ok, stop trazodone and I will write for Ambien 10 Mg, generic. She w can break the tab in two and try 1/2 of the dose.   Larey Seat, MD

## 2018-05-02 NOTE — Telephone Encounter (Signed)
Pt returned call and we were able to discuss her concerns. The patient was started on the trazodone 04/17/2018. She states the first 2-3 days she thought it was her transitioning from the Ambien CR to the trazodone. She states that she has continued to take it nightly and its almost causing opposite affect. She states that she lays in the bed unable to fall asleep if she does fall asleep she is averaging about 2 hrs of sleep and its not restful sleep. When she wakes up she feels droggy and not alert. She has noticed that her balance is slightly affected when she initially gets up. She completely understanding that the Ambien CR 12.5 mg is addictive and due to her age not recommended but she states that she would at least sleep from 10 p to 6 am and it was restful and she never had side effects like she has with the trazodone. She is wanting to know Dr Dohmeier's thoughts and recommendations. I have advised her not to take the trazodone and informed her that I would talk with Dr Brett Fairy and see what she would recommend doing.

## 2018-05-02 NOTE — Telephone Encounter (Signed)
Called the patient and informed her that Dr Brett Fairy is wanting her to stop the trazodone. Advised the patient that Dr Brett Fairy will offer the patient Lorrin Mais 1/2 tablet (5 mg) at bedtime. Pt verbalized understanding. Patient was worried about the decrease in dose since she has taken high dose before. I informed her that due to her age it really is not a medication that Dr Brett Fairy feels comfortable prescribing more then 5 mg and for her to try using that strength and then keep [place] posted on how she is doing. Pt verbalized understanding and was appreciative for the call back.

## 2018-05-02 NOTE — Addendum Note (Signed)
Addended by: Larey Seat on: 05/02/2018 02:32 PM   Modules accepted: Orders

## 2018-05-06 ENCOUNTER — Telehealth: Payer: Self-pay | Admitting: Cardiology

## 2018-05-06 NOTE — Telephone Encounter (Signed)
   Cardiac Questionnaire:    Since your last visit or hospitalization:    1. Have you been having new or worsening chest pain? No    2. Have you been having new or worsening shortness of breath? No  3. Have you been having new or worsening leg swelling, wt gain, or increase in abdominal girth (pants fitting more tightly)? No    4. Have you had any passing out spells? No     *A YES to any of these questions would result in the appointment being kept. *If all the answers to these questions are NO, we should indicate that given the current situation regarding the worldwide coronarvirus pandemic, at the recommendation of the CDC, we are looking to limit gatherings in our waiting area, and thus will reschedule their appointment beyond four weeks from today.   _____________     Primary Cardiologist:  Minus Breeding, MD   Patient contacted.  History reviewed.  No symptoms to suggest any unstable cardiac conditions.  Based on discussion, with current pandemic situation, we will be postponing this appointment for Connie Owens.  If symptoms change, she has been instructed to contact our office.   Routing to C19 CANCEL pool for tracking (P CV DIV CV19 CANCEL) and assigning priority (2 = 6-12 wks).  Kathyrn Drown, NP  05/06/2018 12:22 PM         .

## 2018-05-10 ENCOUNTER — Ambulatory Visit: Payer: Medicare Other | Admitting: Cardiology

## 2018-05-16 ENCOUNTER — Telehealth: Payer: Self-pay

## 2018-05-16 NOTE — Telephone Encounter (Signed)
Called pt she states that she does not think she needs a visit at this time maybe in a couple months she will await a CB from the COVID cancel pool and schedule at that time

## 2018-05-21 ENCOUNTER — Other Ambulatory Visit: Payer: Self-pay | Admitting: Medical

## 2018-05-30 ENCOUNTER — Telehealth: Payer: Self-pay | Admitting: Cardiology

## 2018-05-30 NOTE — Telephone Encounter (Signed)
lmsg for patient to call back to r/s appt on 3/27.  She can do evisit or schedule in august.

## 2018-05-31 ENCOUNTER — Other Ambulatory Visit: Payer: Self-pay | Admitting: Neurology

## 2018-06-03 ENCOUNTER — Other Ambulatory Visit: Payer: Self-pay | Admitting: Neurology

## 2018-07-10 ENCOUNTER — Other Ambulatory Visit: Payer: Self-pay | Admitting: Obstetrics and Gynecology

## 2018-07-10 ENCOUNTER — Ambulatory Visit (INDEPENDENT_AMBULATORY_CARE_PROVIDER_SITE_OTHER): Payer: Medicare Other | Admitting: Neurology

## 2018-07-10 DIAGNOSIS — I10 Essential (primary) hypertension: Secondary | ICD-10-CM

## 2018-07-10 DIAGNOSIS — G4733 Obstructive sleep apnea (adult) (pediatric): Secondary | ICD-10-CM | POA: Diagnosis not present

## 2018-07-10 DIAGNOSIS — I2510 Atherosclerotic heart disease of native coronary artery without angina pectoris: Secondary | ICD-10-CM

## 2018-07-10 DIAGNOSIS — E8881 Metabolic syndrome: Secondary | ICD-10-CM

## 2018-07-10 DIAGNOSIS — G44229 Chronic tension-type headache, not intractable: Secondary | ICD-10-CM

## 2018-07-10 DIAGNOSIS — H04123 Dry eye syndrome of bilateral lacrimal glands: Secondary | ICD-10-CM

## 2018-07-10 DIAGNOSIS — Z1231 Encounter for screening mammogram for malignant neoplasm of breast: Secondary | ICD-10-CM

## 2018-07-10 DIAGNOSIS — Z9989 Dependence on other enabling machines and devices: Secondary | ICD-10-CM

## 2018-07-11 ENCOUNTER — Other Ambulatory Visit: Payer: Self-pay

## 2018-07-12 DIAGNOSIS — Z96651 Presence of right artificial knee joint: Secondary | ICD-10-CM | POA: Diagnosis not present

## 2018-07-12 DIAGNOSIS — M1712 Unilateral primary osteoarthritis, left knee: Secondary | ICD-10-CM | POA: Diagnosis not present

## 2018-07-12 DIAGNOSIS — Z471 Aftercare following joint replacement surgery: Secondary | ICD-10-CM | POA: Diagnosis not present

## 2018-07-16 ENCOUNTER — Other Ambulatory Visit: Payer: Self-pay

## 2018-07-16 MED ORDER — CARVEDILOL 6.25 MG PO TABS: 6.2500 mg | ORAL_TABLET | Freq: Two times a day (BID) | ORAL | 2 refills | Status: AC

## 2018-07-16 MED ORDER — CLOPIDOGREL BISULFATE 75 MG PO TABS: 75.0000 mg | ORAL_TABLET | Freq: Every day | ORAL | 2 refills | Status: DC

## 2018-07-17 ENCOUNTER — Telehealth: Payer: Self-pay | Admitting: Neurology

## 2018-07-17 NOTE — Telephone Encounter (Signed)
I called pt. I advised pt that Dr. Brett Fairy reviewed their sleep study results and found that pt sleep apnea. Dr. Brett Fairy recommends that pt starts auto CPAP. I reviewed PAP compliance expectations with the pt. Pt is agreeable to starting a CPAP. I advised pt that an order will be sent to a DME, [facility], and [facility] will call the pt within about one week after they file with the pt's insurance. [facility] will show the pt how to use the machine, fit for masks, and troubleshoot the CPAP if needed. A follow up appt was made for insurance purposes with Ward Givens, NP on Aug 4,2020 at 9 am. Pt verbalized understanding to arrive 15 minutes early and bring their CPAP. A letter with all of this information in it will be mailed to the pt as a reminder. I verified with the pt that the address we have on file is correct. Pt verbalized understanding of results. Pt had no questions at this time but was encouraged to call back if questions arise. I have sent the order to [facility] and have received confirmation that they have received the order.

## 2018-07-17 NOTE — Procedures (Signed)
PATIENT'S NAME:  Devonna, Oboyle DOB:      1945-04-07      MR#:    [id number]     DATE OF RECORDING: 07/10/2018 REFERRING M.D.:  Garnet Koyanagi -Cheri Rous, DO Study Performed:   Baseline Polysomnogram HISTORY:  Last visit 04-17-2018, I have the pleasure of seeing Alicen Donalson today, is a 73 year old Caucasian right-handed female patient who is followed here for sleep apnea and compliant with CPAP treatment.  She is also followed by Dr. Percival Spanish, her cardiologist.  He has been very happy with her cholesterol control.  She underwent  shoulder surgery in September 2019 and has recovered well.  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  She has also lost a few pounds which helps her obstructive sleep apnea as well.     Today's compliance shows 70% for over 4 hours with an average of 5 hours 28 minutes, she is using an S9 AutoSet but is not auto titration capable, has a  set pressure at 6 cm and expiratory relief pressure at 3 cmH2O there is a residual AHI of 1.1/h. Some nights she has major air leaks which increase the apnea count by unknown events 0.5/h.  Overall her apnea control is excellent.   Machine is 73 years old, and she is a mild apnea patient - she likes to sleep with CPAP as she continues to feel better after use at night, but she gets "entangled".   The patient endorsed the Epworth Sleepiness Scale at 8 points.   The patient's weight 199 pounds with a height of 63.5 (inches), resulting in a BMI of 35.2 kg/m2. The patient's neck circumference measured 15 inches.  CURRENT MEDICATIONS: Aspirin, Astelin, Combigan, Coreg, Plavix, Estrace, Repatha, Neurontin, Imdur, Xalatan, Nitrostat, Pamelor, Zanaflex, Ambien, Protonix, Cozaar   PROCEDURE:  This is a multichannel digital polysomnogram utilizing the Somnostar 11.2 system.  Electrodes and sensors were applied and monitored per AASM Specifications.   EEG, EOG, Chin and Limb EMG, were sampled at 200 Hz.  ECG,  Snore and Nasal Pressure, Thermal Airflow, Respiratory Effort, CPAP Flow and Pressure, Oximetry was sampled at 50 Hz. Digital video and audio were recorded.      BASELINE STUDY: Lights Out was at 22:55 and Lights On at 05:00.  Total recording time (TRT) was 365 minutes, with a total sleep time (TST) of 244 minutes.   The patient's sleep latency was 50.5 minutes.  REM latency was 31.5 minutes.  The sleep efficiency was 66.8 %.     SLEEP ARCHITECTURE: WASO (Wake after sleep onset) was 72 minutes.  There were 28 minutes in Stage N1, 52.5 minutes Stage N2, 89 minutes Stage N3 and 74.5 minutes in Stage REM.  The percentage of Stage N1 was 11.5%, Stage N2 was 21.5%, Stage N3 was 36.5% and Stage R (REM sleep) was 30.5%.    RESPIRATORY ANALYSIS:  There were a total of 41 respiratory events:  0 obstructive apneas, 2 central apneas and 0 mixed apneas with a total of 2 apneas and an apnea index (AI) of .5 /hour. There were 39 hypopneas with a hypopnea index of 9.6 /hour. The patient also had 0 respiratory event related arousals (RERAs).  The total APNEA/HYPOPNEA INDEX (AHI) was 10.1 /hour and the total RESPIRATORY DISTURBANCE INDEX was 10.1 /hour.  39 events occurred in REM sleep and 4 events in NREM. The REM AHI was 31.4 /hour, versus a non-REM AHI of 0.7/h. The patient spent 0  minutes of total sleep time in the supine position and 244 minutes in non-supine. The supine AHI was 0.0 versus a non-supine AHI of 10.1/h.  OXYGEN SATURATION & C02:  The Wake baseline 02 saturation was 92%, with the lowest being 68%. Time spent below 89% saturation equaled 130 minutes.    The arousals were noted as: 29 were spontaneous, 0 were associated with PLMs, and 6 were associated with respiratory events. The patient had a total of 0 Periodic Limb Movements.  She had one time nocturia.  Audio and video analysis did not show any abnormal or unusual movements, behaviors, phonations or vocalizations.   Snoring was noted. EKG was in  keeping with regular rhythm (NSR), variable QRS.  IMPRESSION:  1. 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 CPAP treatment should be continued as a prolonged hypoxemia is also found, at 130 minutes under SpO2 of 89% . 2. Snoring 3. Central Sleep Apnea and hypopneas dominate the AHI.   RECOMMENDATIONS:  1. Advise auto- CPAP titration to optimize therapy.  Prescription will be for humidified CPAP auto settings form 5-15 cm water with 3 cm EPR. Mask of patients choice, I recommend not covering her nose as she has irritation to cornea and conjunctiva- please fit for mask of patient's choice.    I certify that I have reviewed the entire raw data recording prior to the issuance of this report in accordance with the Standards of Accreditation of the American Academy of Sleep Medicine (AASM)  Larey Seat, MD   07-17-2018 Diplomat, American Board of Psychiatry and Neurology  Diplomat, American Board of [place] Director, [employer] [facility] [employer]

## 2018-07-17 NOTE — Telephone Encounter (Signed)
-----   Message from Larey Seat, MD sent at 07/17/2018  9:06 AM EDT ----- IMPRESSION:  1. There is still mild Sleep Apnea (Complex SA) with REM sleep  accentuation noted. CPAP treatment should be continued as a  prolonged hypoxemia is also found, at 130 minutes under SpO2 of  89% . 2. Snoring 3. Central Sleep Apnea and hypopneas dominate the AHI.   RECOMMENDATIONS:  1. Advise auto- CPAP titration to optimize therapy. Prescription  will be for humidified CPAP auto settings form 5-15 cm water with  3 cm EPR. Mask of patients choice, I recommend not covering her  nose as she has irritation to cornea and conjunctiva- please fit  for mask of patient's choice.

## 2018-07-17 NOTE — Addendum Note (Signed)
Addended by: Larey Seat on: 07/17/2018 09:07 AM   Modules accepted: Orders

## 2018-07-26 ENCOUNTER — Encounter: Payer: Self-pay | Admitting: *Deleted

## 2018-07-29 ENCOUNTER — Ambulatory Visit (INDEPENDENT_AMBULATORY_CARE_PROVIDER_SITE_OTHER): Payer: Medicare Other | Admitting: Gastroenterology

## 2018-07-29 ENCOUNTER — Encounter: Payer: Self-pay | Admitting: Gastroenterology

## 2018-07-29 ENCOUNTER — Other Ambulatory Visit: Payer: Self-pay

## 2018-07-29 ENCOUNTER — Telehealth: Payer: Self-pay | Admitting: *Deleted

## 2018-07-29 VITALS — Ht 65.0 in | Wt 199.0 lb

## 2018-07-29 DIAGNOSIS — I2584 Coronary atherosclerosis due to calcified coronary lesion: Secondary | ICD-10-CM | POA: Diagnosis not present

## 2018-07-29 DIAGNOSIS — I2510 Atherosclerotic heart disease of native coronary artery without angina pectoris: Secondary | ICD-10-CM | POA: Diagnosis not present

## 2018-07-29 DIAGNOSIS — K580 Irritable bowel syndrome with diarrhea: Secondary | ICD-10-CM

## 2018-07-29 DIAGNOSIS — Z86010 Personal history of colonic polyps: Secondary | ICD-10-CM

## 2018-07-29 MED ORDER — SUPREP BOWEL PREP KIT 17.5-3.13-1.6 GM/177ML PO SOLN: 1.0000 | Freq: Once | ORAL | 0 refills | Status: AC

## 2018-07-29 NOTE — Progress Notes (Signed)
Connie Owens    [id number]    Apr 30, 1945  Primary Care Physician:Lowne Chase, Alferd Apa, DO  Referring Physician: Carollee Herter, Alferd Apa, DO [address] [place],  [place] [address]  This service was provided via audio only telemedicine (telephone) due to [place] 19 pandemic.  Patient location: Home Provider location: Office Used 2 patient identifiers to confirm the correct person. Explained the limitations in evaluation and management via telemedicine. Patient is aware of potential medical charges for this visit.  Patient consented to this virtual visit.  The persons participating in this telemedicine service were myself and the patient   Chief complaint:  Diarrhea HPI: 73 year old very pleasant female with complaints of diarrhea associated with fecal urgency once or twice a day, worse in the past few months her symptoms are worse in the morning after breakfast and lunch.  Denies any nocturnal symptoms No blood, melena or mucus.  Denies any abdominal pain, decreased appetite or weight loss.  Colonoscopy 12/10/2014: 4 small sessile polyps, tubular adenomas removed, hemorrhoids.     Outpatient Encounter Medications as of 07/29/2018  Medication Sig  . aspirin 81 MG tablet Take 81 mg by mouth daily.  [name] azelastine (ASTELIN) 0.1 % nasal spray Place 2 sprays into both nostrils 2 (two) times daily as needed for rhinitis or allergies.  . brimonidine-timolol (COMBIGAN) 0.2-0.5 % ophthalmic solution Place 1 drop into the left eye 2 (two) times daily. Place 1 drop into left eye twice daily (morning & evening)  . carvedilol (COREG) 6.25 MG tablet Take 1 tablet (6.25 mg total) by mouth 2 (two) times daily.  . clobetasol ointment (TEMOVATE) 7.41 % Apply 1 application topically 2 (two) times daily.  . clopidogrel (PLAVIX) 75 MG tablet Take 1 tablet (75 mg total) by mouth daily.  [name] estradiol (ESTRACE) 1 MG tablet Take 1 mg by mouth at bedtime.   . Evolocumab (REPATHA  SURECLICK) 287 MG/ML SOAJ Inject 140 mg into the skin every 14 (fourteen) days.  . Evolocumab (REPATHA) 140 MG/ML SOSY Inject into the skin.  [name] gabapentin (NEURONTIN) 100 MG capsule 2 po tid for pain (Patient taking differently: 400 mg. 4 po tid for pain)  . isosorbide mononitrate (IMDUR) 30 MG 24 hr tablet TAKE 1 TABLET(30 MG) BY MOUTH DAILY  . latanoprost (XALATAN) 0.005 % ophthalmic solution Place 1 drop into both eyes at bedtime.   . nitroGLYCERIN (NITROSTAT) 0.4 MG SL tablet Place 1 tablet (0.4 mg total) under the tongue every 5 (five) minutes x 3 doses as needed for chest pain.  . nortriptyline (PAMELOR) 10 MG capsule Use between 2 and 5 capsules at night po , prn-depending on headache  . prednisoLONE acetate (PRED FORTE) 1 % ophthalmic suspension Place 1-2 drops into both eyes daily. 2 drops right eye daily and l drop left eye daily  . tiZANidine (ZANAFLEX) 4 MG tablet Take 4 mg by mouth every 8 (eight) hours as needed for muscle spasms.  [name] zolpidem (AMBIEN) 10 MG tablet TAKE 1/2 TABLET(5 MG) BY MOUTH AT BEDTIME AS NEEDED FOR SLEEP  . losartan (COZAAR) 100 MG tablet Take 1 tablet (100 mg total) by mouth daily.   No facility-administered encounter medications on file as of 07/29/2018.     Allergies as of 07/29/2018 - Review Complete 07/29/2018  Allergen Reaction Noted  . Iohexol Rash 08/12/2003  . Iodinated diagnostic agents Other (See Comments) and Rash 05/10/2017  . Statins Other (See  Comments) 04/24/2016  . Imdur [isosorbide nitrate]  10/06/2011  . Latex Rash 09/27/2011  . Tape Rash and Other (See Comments) 09/27/2011  . Verapamil Other (See Comments) 11/04/2008    Past Medical History:  Diagnosis Date  . Anxiety state    NOS  . Arthritis    osteo...right knee  . CAD (coronary artery disease)    NSTEMI 8/13 => LHC 09/27/11: oLM 50%, dLM 30%, oLAD 60-70%, pLAD 50%, mLAD 70%, pD1 30%, pD2 40%, oRCA 25%, mRCA 40%, EF 50% with ant HK => LAD not amenable to PCI => med Rx  unless  recurrent angina = > consider CABG  . Carpal tunnel syndrome   . Complication of anesthesia   . Corneal disorder   . Diverticular disease   . Dysmetabolic syndrome X   . GERD (gastroesophageal reflux disease)   . 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   [name] Headache, paroxysmal hemicrania, chronic 07/23/2012    Referral to BOTOX evaluation with dr Rexene Alberts or Krista Blue .  [name] Heart attack ([place]) 09/2011  . Hernia    hiatal  . HLD (hyperlipidemia)   . Hx of adenomatous colonic polyps   . Hypertension    essen. NOS  . Ischemic cardiomyopathy    a.  Echo 09/29/11: EF 30-35% with inferior, posterior, lateral AK, anterior severe HK, mild LVH, mild MR, PASP 40;  => b. follow up echo 10/13:  EF 55-60%, Gr 1 diast dysfn, mild MR  . Migraine headache without aura 07/23/2012    Left sided  Lamonte Sakai thought to be migrainous  , hypersensitivity to touch , photophobia ,  no nausea.   [name] PONV (postoperative nausea and vomiting)   . Shoulder fracture   . Sleep apnea    CPAP    Past Surgical History:  Procedure Laterality Date  . ABDOMINAL HYSTERECTOMY    . ABDOMINOPLASTY  10/23/2016  . ankle cystectomy Right   . APPENDECTOMY    . BRACHIOPLASTY Bilateral 05/02/2016  . BREAST EXCISIONAL BIOPSY Right 02/15/1994  . BREAST LUMPECTOMY Right 1996  . BUNIONECTOMY Right   . CARDIAC CATHETERIZATION  09/28/2011   Dr Acie Fredrickson(  to see Dr Ron Parker)  . CHOLECYSTECTOMY    . COLONOSCOPY W/ POLYPECTOMY    . 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 CORNEAL TRANSPLANT Left    x1  . HEMORRHOID SURGERY  12/01/2010, 2014   [place] hemorrhoid ligation/pexy  . LEFT HEART CATHETERIZATION WITH CORONARY ANGIOGRAM N/A 09/28/2011   Procedure: LEFT HEART CATHETERIZATION WITH CORONARY ANGIOGRAM;  Surgeon: Minus Breeding, MD;  Location: [facility] CATH LAB;  Service: Cardiovascular;  Laterality: N/A;  . lumbar spur removed    . rectocele  surgery    . SHOULDER OPEN ROTATOR CUFF REPAIR Right 10/31/2017   Procedure: Right rotator cuff repair with graft and anchor, acromioplasty;  Surgeon: Latanya Maudlin, MD;  Location: [facility] ORS;  Service: Orthopedics;  Laterality: Right;  . TOTAL KNEE ARTHROPLASTY Right     Family History  Problem Relation Age of Onset  . Breast cancer Mother   . Heart disease Father   . Stroke Father   . Dementia Father   . Prostate cancer Father   . Diabetes Sister        3 sisters   . Heart disease Sister        1 sister CABG  . Breast cancer Sister  PTE pre & post Dx of ca  . Diabetes Paternal Grandfather   . Colon cancer Neg Hx     Social History   Socioeconomic History  . Marital status: Widowed    Spouse name: Ruthann Cancer  . Number of children: 3  . Years of education: 28  . Highest education level: Not on file  Occupational History  . 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 Financial resource strain: Not on file  . Food insecurity    Worry: Not on file    Inability: Not on file  . Transportation needs    Medical: Not on file    Non-medical: Not on file  Tobacco Use  . Smoking status: Never Smoker  . Smokeless tobacco: Never Used  Substance and Sexual Activity  . Alcohol use: Yes    Comment: rare  . Drug use: No  . Sexual activity: Not on file  Lifestyle  . Physical activity    Days per week: Not on file    Minutes per session: Not on file  . Stress: Not on file  Relationships  . Social Herbalist on phone: Not on file    Gets together: Not on file    Attends religious service: Not on file    Active member of club or organization: Not on file    Attends meetings of clubs or organizations: Not on file    Relationship status: Not on file  . Intimate partner violence    Fear of current or ex partner: Not on file    Emotionally abused: Not on file    Physically abused: Not on file    Forced sexual activity: Not  on file  Other Topics Concern  . Not on file  Social History Narrative   Patient is widowed,([place] past 10/02/13) and lives at home with her husband.   Patient has three adult children.   Patient has a college education.   Patient is right-handed.   Patient drinks two cups of coffee daily.   Regular exercise   Patient is retired.      Review of systems: Review of Systems as per HPI All other systems reviewed and are negative.   Physical Exam: Vitals were not taken and physical exam was not performed during this virtual visit.  Data Reviewed:  Reviewed labs, radiology imaging, old records and pertinent past GI work up   Assessment and Plan/Recommendations:  73 year old female with complaints of diarrhea associated with fecal urgency for past few months Symptoms likely exacerbated secondary to possible lactose intolerance, will do a trial of lactose-free diet for 1 to 2 weeks Start Benefiber 1 tablespoon twice daily with meals  She is due for surveillance colonoscopy due to history of multiple adenomatous polyps removed in 2016, will also obtain biopsies from colon to exclude microscopic colitis  Hold Plavix 5 days prior to procedure, will need clearance from cardiology   The risks and benefits as well as alternatives of endoscopic procedure(s) have been discussed and reviewed. All questions answered. The patient agrees to proceed.    Damaris Hippo , MD   CC: Carollee Herter, Alferd Apa, *

## 2018-07-29 NOTE — Telephone Encounter (Signed)
[facility] Pre-operative Risk Assessment     Request for surgical clearance:     Endoscopy Procedure  What type of surgery is being performed?     Colonoscopy  When is this surgery scheduled?     08/29/2018  What type of clearance is required ?   Pharmacy  Are there any medications that need to be held prior to surgery and how long? Plavix 5 days   Practice name and name of physician performing surgery?      [facility]  What is your office phone and fax number?      Phone- [phone]  [phone]  Anesthesia type (None, local, MAC, general) ?       MAC

## 2018-07-29 NOTE — Patient Instructions (Addendum)
Due for surveillance colonoscopy, schedule next available appointment at [place] have been scheduled for a colonoscopy. Please follow written instructions given to you at your visit today.  Please pick up your prep supplies at the pharmacy within the next 1-3 days. If you use inhalers (even only as needed), please bring them with you on the day of your procedure.  Hold Plavix 5 days prior to procedure, will need clearance from cardiology   You will be contacted by our office prior to your procedure for directions on holding your Plavix.  If you do not hear from our office 1 week prior to your scheduled procedure, please call [phone] to discuss.   Benefiber 1 tablespoon twice daily with meals  Trial of lactose-free diet for 1 to 2 weeks  I appreciate the  opportunity to care for you  Thank You   Harl Bowie , MD

## 2018-07-29 NOTE — Telephone Encounter (Signed)
   Primary Cardiologist: Minus Breeding, MD  Chart reviewed as part of pre-operative protocol coverage.   Hx of multivessel CAD at cath 2013- medical Rx (not amenable to PCI). Low risk POET in 2018.  Dr. Percival Spanish, can patient hold Plavix? Please forward your response to P CV DIV PREOP.   Thank you  Leanor Kail, PA 07/29/2018, 11:38 AM

## 2018-08-01 ENCOUNTER — Ambulatory Visit: Payer: Medicare Other | Admitting: *Deleted

## 2018-08-01 ENCOUNTER — Ambulatory Visit: Payer: Medicare Other | Admitting: Family Medicine

## 2018-08-01 NOTE — Telephone Encounter (Signed)
   Primary Cardiologist: Minus Breeding, MD  Chart reviewed as part of pre-operative protocol coverage. Given past medical history and time since last visit, based on ACC/AHA guidelines, Connie Owens would be at acceptable risk for the planned procedure without further cardiovascular testing.   Per Dr. Percival Spanish, it is acceptable to hold Plavix as needed for upcoming procedure then resume as deemed safe per requesting MD.   I will route this recommendation to the requesting party via Epic fax function and remove from pre-op pool.  Please call with questions.  Kathyrn Drown, NP 08/01/2018, 2:22 PM

## 2018-08-01 NOTE — Telephone Encounter (Signed)
OK to hold Plavix as needed for the surgery.  Resume per the operating physician after the procedure.

## 2018-08-02 ENCOUNTER — Telehealth: Payer: Self-pay

## 2018-08-02 NOTE — Telephone Encounter (Signed)
Spoke with patient and told her that per Dr. Percival Spanish she could hold her Plavix for five days prior to her procedure.  Patient agreed.

## 2018-08-06 ENCOUNTER — Encounter: Payer: Self-pay | Admitting: Family Medicine

## 2018-08-06 ENCOUNTER — Ambulatory Visit (INDEPENDENT_AMBULATORY_CARE_PROVIDER_SITE_OTHER): Payer: Medicare Other | Admitting: Family Medicine

## 2018-08-06 ENCOUNTER — Other Ambulatory Visit: Payer: Self-pay

## 2018-08-06 VITALS — BP 110/86 | HR 76 | Temp 98.3°F | Resp 17 | Ht 65.0 in | Wt 203.0 lb

## 2018-08-06 DIAGNOSIS — Z8639 Personal history of other endocrine, nutritional and metabolic disease: Secondary | ICD-10-CM

## 2018-08-06 DIAGNOSIS — R42 Dizziness and giddiness: Secondary | ICD-10-CM

## 2018-08-06 DIAGNOSIS — I2510 Atherosclerotic heart disease of native coronary artery without angina pectoris: Secondary | ICD-10-CM | POA: Diagnosis not present

## 2018-08-06 DIAGNOSIS — M79604 Pain in right leg: Secondary | ICD-10-CM | POA: Diagnosis not present

## 2018-08-06 DIAGNOSIS — I2584 Coronary atherosclerosis due to calcified coronary lesion: Secondary | ICD-10-CM

## 2018-08-06 DIAGNOSIS — M79605 Pain in left leg: Secondary | ICD-10-CM | POA: Diagnosis not present

## 2018-08-06 DIAGNOSIS — M792 Neuralgia and neuritis, unspecified: Secondary | ICD-10-CM | POA: Diagnosis not present

## 2018-08-06 DIAGNOSIS — R5383 Other fatigue: Secondary | ICD-10-CM | POA: Diagnosis not present

## 2018-08-06 DIAGNOSIS — L409 Psoriasis, unspecified: Secondary | ICD-10-CM

## 2018-08-06 MED ORDER — LEVOCETIRIZINE DIHYDROCHLORIDE 5 MG PO TABS: 5.0000 mg | ORAL_TABLET | Freq: Every evening | ORAL | 5 refills | Status: DC

## 2018-08-06 MED ORDER — GABAPENTIN 100 MG PO CAPS: 400.0000 mg | ORAL_CAPSULE | Freq: Three times a day (TID) | ORAL | 1 refills | Status: AC

## 2018-08-06 MED ORDER — CLOBETASOL PROPIONATE 0.05 % EX OINT: 1.0000 "application " | TOPICAL_OINTMENT | Freq: Two times a day (BID) | CUTANEOUS | 1 refills | Status: AC

## 2018-08-06 MED ORDER — FLUTICASONE PROPIONATE 50 MCG/ACT NA SUSP: 2.0000 | Freq: Every day | NASAL | 6 refills | Status: DC

## 2018-08-06 NOTE — Progress Notes (Signed)
Patient ID: Connie Owens, female    DOB: 07-21-1945  Age: 73 y.o. MRN: [id number]    Subjective:  Subjective  HPI [name] presents for R ear pain and dizziness   No congestion ,  Some snap crackle and pop.  No otc meds Symptoms x few weeks Pt also c/o both legs throbbing and aching.  Esp at night    No sob , no chest pain  , no calf pain    Review of Systems  Constitutional: Negative for appetite change, diaphoresis, fatigue and unexpected weight change.  HENT: Positive for ear pain. Negative for congestion, ear discharge, facial swelling, hearing loss, nosebleeds, rhinorrhea, sinus pressure, sinus pain, sneezing and sore throat.   Eyes: Negative for pain, redness and visual disturbance.  Respiratory: Negative for cough, chest tightness, shortness of breath and wheezing.   Cardiovascular: Negative for chest pain, palpitations and leg swelling.  Endocrine: Negative for cold intolerance, heat intolerance, polydipsia, polyphagia and polyuria.  Genitourinary: Negative for difficulty urinating, dysuria and frequency.  Musculoskeletal: Positive for myalgias. Negative for gait problem and joint swelling.  Neurological: Positive for dizziness. Negative for light-headedness, numbness and headaches.    History Past Medical History:  Diagnosis Date  . Anxiety state    NOS  . Arthritis    osteo...right knee  . CAD (coronary artery disease)    NSTEMI 8/13 => LHC 09/27/11: oLM 50%, dLM 30%, oLAD 60-70%, pLAD 50%, mLAD 70%, pD1 30%, pD2 40%, oRCA 25%, mRCA 40%, EF 50% with ant HK => LAD not amenable to PCI => med Rx  unless recurrent angina = > consider CABG  . Carpal tunnel syndrome   . Complication of anesthesia   . Corneal disorder   . Diverticular disease   . Dysmetabolic syndrome X   . GERD (gastroesophageal reflux disease)   . 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   [name] Headache,  paroxysmal hemicrania, chronic 07/23/2012    Referral to BOTOX evaluation with dr Rexene Alberts or Krista Blue .  [name] Heart attack ([place]) 09/2011  . Hernia    hiatal  . HLD (hyperlipidemia)   . Hx of adenomatous colonic polyps   . Hypertension    essen. NOS  . Ischemic cardiomyopathy    a.  Echo 09/29/11: EF 30-35% with inferior, posterior, lateral AK, anterior severe HK, mild LVH, mild MR, PASP 40;  => b. follow up echo 10/13:  EF 55-60%, Gr 1 diast dysfn, mild MR  . Migraine headache without aura 07/23/2012    Left sided  Lamonte Sakai thought to be migrainous  , hypersensitivity to touch , photophobia ,  no nausea.   [name] PONV (postoperative nausea and vomiting)   . Shoulder fracture   . 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   Her family history includes Breast cancer in her mother and sister; Dementia in her father; Diabetes in her paternal grandfather and sister;  Heart disease in her father and sister; Prostate cancer in her father; Stroke in her father.She reports that she has never smoked. She has never used smokeless tobacco. She reports current alcohol use. She reports that she does not use drugs.  Current Outpatient Medications on File Prior to Visit  Medication Sig Dispense Refill  . aspirin 81 MG tablet Take 81 mg by mouth daily.     [name] azelastine (ASTELIN) 0.1 % nasal spray Place 2 sprays into both nostrils 2 (two) times daily as needed for rhinitis or allergies. 30 mL 1  . brimonidine-timolol (COMBIGAN) 0.2-0.5 % ophthalmic solution Place 1 drop into the left eye 2 (two) times daily. Place 1 drop into left eye twice daily (morning & evening)    . carvedilol (COREG) 6.25 MG tablet Take 1 tablet (6.25 mg total) by mouth 2 (two) times daily. 180 tablet 2  . clopidogrel (PLAVIX) 75 MG tablet Take 1 tablet (75 mg total) by mouth daily. 90 tablet 2  . estradiol (ESTRACE) 1 MG tablet Take 1 mg by mouth at bedtime.     . Evolocumab (REPATHA SURECLICK) 694 MG/ML SOAJ Inject 140 mg into the skin every 14 (fourteen) days. 6 pen 4  . Evolocumab (REPATHA) 140 MG/ML SOSY Inject into the skin.    [name] isosorbide mononitrate (IMDUR) 30 MG 24 hr tablet TAKE 1 TABLET(30 MG) BY MOUTH DAILY 92 tablet 1  . latanoprost (XALATAN) 0.005 % ophthalmic solution Place 1 drop into both eyes at bedtime.     . nitroGLYCERIN (NITROSTAT) 0.4 MG SL tablet Place 1 tablet (0.4 mg total) under the tongue every 5 (five) minutes x 3 doses as needed for chest pain. 25 tablet 3  . nortriptyline (PAMELOR) 10 MG capsule Use between 2 and 5 capsules at night po , prn-depending on headache 450 capsule 3  . prednisoLONE acetate (PRED FORTE) 1 % ophthalmic suspension Place 1-2 drops into both eyes daily. 2 drops right eye daily and l drop left eye daily    . tiZANidine (ZANAFLEX) 4 MG tablet Take 4 mg by mouth every 8 (eight) hours as needed for muscle spasms.    [name] zolpidem (AMBIEN) 10 MG tablet TAKE 1/2 TABLET(5 MG) BY MOUTH AT BEDTIME AS NEEDED FOR SLEEP 15 tablet 5  . losartan (COZAAR) 100 MG tablet Take 1 tablet (100 mg total) by mouth daily. 90 tablet 3   No current facility-administered medications on file prior to visit.      Objective:  Objective  Physical Exam Vitals signs and nursing note reviewed.  HENT:     Right Ear: Tympanic membrane is bulging. Tympanic  membrane is not erythematous.     Left Ear: Tympanic membrane, ear canal and external ear normal. No decreased hearing noted.  Musculoskeletal:     Right lower leg: She exhibits no tenderness, no bony tenderness, no swelling, no deformity and no laceration. No edema.     Left lower leg: She exhibits no tenderness, no bony tenderness, no swelling, no deformity and no laceration.       Legs:    BP 110/86 (BP Location: Right Arm, Patient Position: Sitting, Cuff Size: Large)   Pulse 76   Temp 98.3 F (36.8 C) (Oral)   Resp 17   Ht 5\' 5"  (1.651 m)   Wt 203 lb (92.1 kg)   SpO2 98%   BMI 33.78 kg/m  Wt Readings from Last 3 Encounters:  08/06/18 203 lb (92.1 kg)  07/29/18 199 lb (90.3 kg)  07/26/18 199 lb (90.3 kg)     Lab Results  Component Value Date   WBC 9.1 10/25/2017   HGB 12.3 10/25/2017   HCT 38.4 10/25/2017   PLT 361 10/25/2017   GLUCOSE 93 10/25/2017   CHOL 132 04/09/2018   TRIG 141 04/09/2018   HDL 59 04/09/2018   LDLDIRECT 140.8 03/18/2013   LDLCALC 45 04/09/2018   ALT 15 10/25/2017   AST 19 10/25/2017   NA 137 10/25/2017   K 4.6 10/25/2017   CL 102 10/25/2017   CREATININE 0.63 10/25/2017   BUN 12 10/25/2017   CO2 27 10/25/2017   TSH 2.00 12/16/2015   INR 0.97 10/25/2017   HGBA1C 5.9 12/29/2013   MICROALBUR <0.7 11/17/2014    Dg Shoulder Right  Result Date: 12/16/2017 CLINICAL DATA:  Right shoulder pain with history of prior rotator cuff surgery 3 weeks ago, initial encounter EXAM: RIGHT SHOULDER - 2+ VIEW COMPARISON:  11/19/2017. FINDINGS: Degenerative changes of the acromioclavicular joint are again noted. There are changes consistent with operative acromioplasty. A bony density is noted adjacent to the acromion superior to the humeral head which was not seen on prior chest x-ray from 11/19/2017. A residual bone fragment could not be totally excluded. No fracture or dislocation is seen. The underlying bony thorax is within normal limits. IMPRESSION:  Postoperative changes as described. A small bony density is noted superior to the humeral head which may be residual from the recent acromioplasty. Electronically Signed   By: Inez Catalina M.D.   On: 12/16/2017 19:37     Assessment & Plan:  Plan  I have changed Ivan Anchors. Toscano's gabapentin. I am also having her start on fluticasone and levocetirizine. Additionally, I am having her maintain her estradiol, aspirin, brimonidine-timolol, prednisoLONE acetate, latanoprost, tiZANidine, Evolocumab, losartan, nitroGLYCERIN, isosorbide mononitrate, nortriptyline, azelastine, zolpidem, clopidogrel, carvedilol, Repatha, and clobetasol ointment.  Meds ordered this encounter  Medications  . gabapentin (NEURONTIN) 100 MG capsule    Sig: Take 4 capsules (400 mg total) by mouth 3 (three) times daily. 4 po tid for pain    Dispense:  1080 capsule    Refill:  1  . clobetasol ointment (TEMOVATE) 0.05 %    Sig: Apply 1 application topically 2 (two) times daily.    Dispense:  90 g    Refill:  1  . fluticasone (FLONASE) 50 MCG/ACT nasal spray    Sig: Place 2 sprays into both nostrils daily.    Dispense:  16 g    Refill:  6  . levocetirizine (XYZAL) 5 MG tablet    Sig: Take 1 tablet (5 mg total) by mouth every evening.    Dispense:  30 tablet    Refill:  5    Problem List Items Addressed This Visit      Unprioritized   Leg pain, bilateral    Compression hose/ socks Elevate legs Check labs  Consider ABI       Other Visit Diagnoses    Dizziness    -  Primary   Relevant Medications   fluticasone (FLONASE) 50 MCG/ACT nasal spray   levocetirizine (XYZAL) 5 MG tablet   Other Relevant Orders   TSH   CBC with Differential/Platelet   Comprehensive metabolic panel   IBC + Ferritin   Fatigue, unspecified type       Relevant Orders   TSH   CBC with Differential/Platelet   Comprehensive metabolic panel   Neuropathic pain       Relevant Medications  gabapentin (NEURONTIN) 100 MG capsule    Psoriasis       Relevant Medications   clobetasol ointment (TEMOVATE) 0.05 %   History of iron deficiency       Relevant Orders   IBC + Ferritin      Follow-up: Return if symptoms worsen or fail to improve, for labs at [facility].  Ann Held, DO

## 2018-08-06 NOTE — Assessment & Plan Note (Signed)
Compression hose/ socks Elevate legs Check labs  Consider ABI

## 2018-08-06 NOTE — Patient Instructions (Signed)
Dizziness Dizziness is a common problem. It is a feeling of unsteadiness or light-headedness. You may feel like you are about to faint. Dizziness can lead to injury if you stumble or fall. Anyone can become dizzy, but dizziness is more common in older adults. This condition can be caused by a number of things, including medicines, dehydration, or illness. Follow these instructions at home: Eating and drinking  Drink enough fluid to keep your urine clear or pale yellow. This helps to keep you from becoming dehydrated. Try to drink more clear fluids, such as water.  Do not drink alcohol.  Limit your caffeine intake if told to do so by your health care provider. Check ingredients and nutrition facts to see if a food or beverage contains caffeine.  Limit your salt (sodium) intake if told to do so by your health care provider. Check ingredients and nutrition facts to see if a food or beverage contains sodium. Activity  Avoid making quick movements. ? Rise slowly from chairs and steady yourself until you feel okay. ? In the morning, first sit up on the side of the bed. When you feel okay, stand slowly while you hold onto something until you know that your balance is fine.  If you need to stand in one place for a long time, move your legs often. Tighten and relax the muscles in your legs while you are standing.  Do not drive or use heavy machinery if you feel dizzy.  Avoid bending down if you feel dizzy. Place items in your home so that they are easy for you to reach without leaning over. Lifestyle  Do not use any products that contain nicotine or tobacco, such as cigarettes and e-cigarettes. If you need help quitting, ask your health care provider.  Try to reduce your stress level by using methods such as yoga or meditation. Talk with your health care provider if you need help to manage your stress. General instructions  Watch your dizziness for any changes.  Take over-the-counter and  prescription medicines only as told by your health care provider. Talk with your health care provider if you think that your dizziness is caused by a medicine that you are taking.  Tell a friend or a family member that you are feeling dizzy. If he or she notices any changes in your behavior, have this person call your health care provider.  Keep all follow-up visits as told by your health care provider. This is important. Contact a health care provider if:  Your dizziness does not go away.  Your dizziness or light-headedness gets worse.  You feel nauseous.  You have reduced hearing.  You have new symptoms.  You are unsteady on your feet or you feel like the room is spinning. Get help right away if:  You vomit or have diarrhea and are unable to eat or drink anything.  You have problems talking, walking, swallowing, or using your arms, hands, or legs.  You feel generally weak.  You are not thinking clearly or you have trouble forming sentences. It may take a friend or family member to notice this.  You have chest pain, abdominal pain, shortness of breath, or sweating.  Your vision changes.  You have any bleeding.  You have a severe headache.  You have neck pain or a stiff neck.  You have a fever. These symptoms may represent a serious problem that is an emergency. Do not wait to see if the symptoms will go away. Get medical help   right away. Call your local emergency services (911 in the [place] Do not drive yourself to the hospital. Summary  Dizziness is a feeling of unsteadiness or light-headedness. This condition can be caused by a number of things, including medicines, dehydration, or illness.  Anyone can become dizzy, but dizziness is more common in older adults.  Drink enough fluid to keep your urine clear or pale yellow. Do not drink alcohol.  Avoid making quick movements if you feel dizzy. Monitor your dizziness for any changes. This information is not intended to  replace advice given to you by your health care provider. Make sure you discuss any questions you have with your health care provider. Document Released: 07/26/2000 Document Revised: 03/04/2016 Document Reviewed: 03/04/2016 Elsevier Interactive Patient Education  2019 [employer]  

## 2018-08-08 ENCOUNTER — Other Ambulatory Visit (INDEPENDENT_AMBULATORY_CARE_PROVIDER_SITE_OTHER): Payer: Medicare Other

## 2018-08-08 DIAGNOSIS — R5383 Other fatigue: Secondary | ICD-10-CM

## 2018-08-08 DIAGNOSIS — R42 Dizziness and giddiness: Secondary | ICD-10-CM | POA: Diagnosis not present

## 2018-08-08 LAB — COMPREHENSIVE METABOLIC PANEL
ALT: 14 U/L (ref 0–35)
AST: 15 U/L (ref 0–37)
Albumin: 3.8 g/dL (ref 3.5–5.2)
Alkaline Phosphatase: 76 U/L (ref 39–117)
BUN: 14 mg/dL (ref 6–23)
CO2: 25 mEq/L (ref 19–32)
Calcium: 9.1 mg/dL (ref 8.4–10.5)
Chloride: 98 mEq/L (ref 96–112)
Creatinine, Ser: 0.72 mg/dL (ref 0.40–1.20)
GFR: 79.34 mL/min (ref >60.00)
Glucose, Bld: 116 mg/dL — ABNORMAL HIGH (ref 70–99)
Potassium: 4.7 mEq/L (ref 3.5–5.1)
Sodium: 132 mEq/L — ABNORMAL LOW (ref 135–145)
Total Bilirubin: 0.3 mg/dL (ref 0.2–1.2)
Total Protein: 7 g/dL (ref 6.0–8.3)

## 2018-08-08 LAB — CBC WITH DIFFERENTIAL/PLATELET
Basophils Absolute: 0.1 10*3/uL (ref 0.0–0.1)
Basophils Relative: 1 % (ref 0.0–3.0)
Eosinophils Absolute: 0.3 10*3/uL (ref 0.0–0.7)
Eosinophils Relative: 3.4 % (ref 0.0–5.0)
HCT: 37.5 % (ref 36.0–46.0)
Hemoglobin: 11.8 g/dL — ABNORMAL LOW (ref 12.0–15.0)
Lymphocytes Relative: 29.3 % (ref 12.0–46.0)
Lymphs Abs: 2.2 10*3/uL (ref 0.7–4.0)
MCHC: 31.4 g/dL (ref 30.0–36.0)
MCV: 78.2 fl (ref 78.0–100.0)
Monocytes Absolute: 0.8 10*3/uL (ref 0.1–1.0)
Monocytes Relative: 10.6 % (ref 3.0–12.0)
Neutro Abs: 4.2 10*3/uL (ref 1.4–7.7)
Neutrophils Relative %: 55.7 % (ref 43.0–77.0)
Platelets: 363 10*3/uL (ref 150.0–400.0)
RBC: 4.79 Mil/uL (ref 3.87–5.11)
RDW: 20.9 % — ABNORMAL HIGH (ref 11.5–15.5)
WBC: 7.5 10*3/uL (ref 4.0–10.5)

## 2018-08-08 LAB — TSH: TSH: 3.67 u[IU]/mL (ref 0.35–4.50)

## 2018-08-08 NOTE — Telephone Encounter (Signed)
Dr Silverio Decamp so you want the patient to hold her Plavix 5 days? They did not specify how long

## 2018-08-09 NOTE — Telephone Encounter (Signed)
Yes, please hold Plavix for 5 days prior to procedure. Thanks

## 2018-08-12 ENCOUNTER — Telehealth: Payer: Self-pay | Admitting: Gastroenterology

## 2018-08-12 NOTE — Telephone Encounter (Signed)
L/m for pt to hold plavix

## 2018-08-13 ENCOUNTER — Other Ambulatory Visit: Payer: Self-pay | Admitting: Family Medicine

## 2018-08-13 DIAGNOSIS — R42 Dizziness and giddiness: Secondary | ICD-10-CM

## 2018-08-13 DIAGNOSIS — D649 Anemia, unspecified: Secondary | ICD-10-CM

## 2018-08-13 DIAGNOSIS — E871 Hypo-osmolality and hyponatremia: Secondary | ICD-10-CM

## 2018-08-13 MED ORDER — LEVOCETIRIZINE DIHYDROCHLORIDE 5 MG PO TABS: 5.0000 mg | ORAL_TABLET | Freq: Every evening | ORAL | 5 refills | Status: AC

## 2018-08-13 MED ORDER — FLUTICASONE PROPIONATE 50 MCG/ACT NA SUSP: 2.0000 | Freq: Every day | NASAL | 6 refills | Status: AC

## 2018-08-13 NOTE — Telephone Encounter (Signed)
Rxs sent

## 2018-08-13 NOTE — Telephone Encounter (Signed)
Medication Refill - Medication: fluticasone (FLONASE) 50 MCG/ACT nasal spray and levocetirizine (XYZAL) 5 MG tablet  Refill request  Preferred Pharmacy (with phone number or street name):  [id number] [address], [place] [phone] (Phone) [phone] (Fax)

## 2018-08-26 ENCOUNTER — Other Ambulatory Visit: Payer: Self-pay

## 2018-08-26 ENCOUNTER — Ambulatory Visit
Admission: RE | Admit: 2018-08-26 | Discharge: 2018-08-26 | Disposition: A | Discharge status: Home / Self Care | Payer: Medicare Other | Source: Ambulatory Visit | Attending: Obstetrics and Gynecology | Admitting: Obstetrics and Gynecology

## 2018-08-26 DIAGNOSIS — Z1231 Encounter for screening mammogram for malignant neoplasm of breast: Secondary | ICD-10-CM

## 2018-08-28 ENCOUNTER — Other Ambulatory Visit: Payer: Self-pay | Admitting: Obstetrics and Gynecology

## 2018-08-28 ENCOUNTER — Telehealth: Payer: Self-pay | Admitting: Gastroenterology

## 2018-08-28 DIAGNOSIS — R928 Other abnormal and inconclusive findings on diagnostic imaging of breast: Secondary | ICD-10-CM

## 2018-08-28 NOTE — Telephone Encounter (Signed)

## 2018-08-28 NOTE — Telephone Encounter (Signed)
Pt responded "no" to all screening questions °

## 2018-08-29 ENCOUNTER — Other Ambulatory Visit: Payer: Self-pay

## 2018-08-29 ENCOUNTER — Ambulatory Visit (AMBULATORY_SURGERY_CENTER): Payer: Medicare Other | Admitting: Gastroenterology

## 2018-08-29 ENCOUNTER — Encounter: Payer: Self-pay | Admitting: Gastroenterology

## 2018-08-29 VITALS — BP 132/64 | HR 60 | Temp 97.7°F | Resp 13 | Ht 65.0 in | Wt 199.0 lb

## 2018-08-29 DIAGNOSIS — Z86010 Personal history of colonic polyps: Secondary | ICD-10-CM

## 2018-08-29 DIAGNOSIS — D120 Benign neoplasm of cecum: Secondary | ICD-10-CM

## 2018-08-29 MED ORDER — SODIUM CHLORIDE 0.9 % IV SOLN: 500.0000 mL | INTRAVENOUS | Status: DC

## 2018-08-29 NOTE — Progress Notes (Signed)
Temps by CW and vitals by JB

## 2018-08-29 NOTE — Op Note (Addendum)
Long Patient Name: Connie Owens Procedure Date: 08/29/2018 7:56 AM MRN: [id number] Endoscopist: Mauri Pole , MD Age: 73 Referring MD:  Date of Birth: 13-Aug-1945 Gender: Female Account #: [id number] Procedure:                Colonoscopy Indications:              High risk colon cancer surveillance: Personal                            history of colonic polyps, High risk colon cancer                            surveillance: Personal history of multiple (3 or                            more) adenomas Medicines:                Monitored Anesthesia Care Procedure:                Pre-Anesthesia Assessment:                           - Prior to the procedure, a History and Physical                            was performed, and patient medications and                            allergies were reviewed. The patient's tolerance of                            previous anesthesia was also reviewed. The risks                            and benefits of the procedure and the sedation                            options and risks were discussed with the patient.                            All questions were answered, and informed consent                            was obtained. Prior Anticoagulants: The patient                            last took Plavix (clopidogrel) 5 days prior to the                            procedure. ASA Grade Assessment: III - A patient                            with severe systemic disease. After reviewing the  risks and benefits, the patient was deemed in                            satisfactory condition to undergo the procedure.                           After obtaining informed consent, the colonoscope                            was passed under direct vision. Throughout the                            procedure, the patient's blood pressure, pulse, and                            oxygen saturations were monitored  continuously. The                            Colonoscope was introduced through the anus and                            advanced to the the cecum, identified by                            appendiceal orifice and ileocecal valve. The                            colonoscopy was performed without difficulty. The                            patient tolerated the procedure well. The quality                            of the bowel preparation was excellent. The                            ileocecal valve, appendiceal orifice, and rectum                            were photographed. Scope In: 8:04:47 AM Scope Out: 8:21:57 AM Scope Withdrawal Time: 0 hours 16 minutes 31 seconds  Total Procedure Duration: 0 hours 17 minutes 10 seconds  Findings:                 The perianal and digital rectal examinations were                            normal.                           Two sessile polyps were found in the cecum. The                            polyps were 1 to 2 mm in size. These polyps were  removed with a cold biopsy forceps. Resection and                            retrieval were complete.                           Non-bleeding internal hemorrhoids were found during                            retroflexion. The hemorrhoids were small. Complications:            No immediate complications. Estimated Blood Loss:     Estimated blood loss was minimal. Impression:               - Two 1 to 2 mm polyps in the cecum, removed with a                            cold biopsy forceps. Resected and retrieved.                           - Non-bleeding internal hemorrhoids. Recommendation:           - Patient has a contact number available for                            emergencies. The signs and symptoms of potential                            delayed complications were discussed with the                            patient. Return to normal activities tomorrow.                             Written discharge instructions were provided to the                            patient.                           - Resume previous diet.                           - Continue present medications.                           - Await pathology results.                           - Repeat colonoscopy in 5 years for surveillance                            based on pathology results.                           - Resume Plavix (clopidogrel) at prior dose today.  Refer to managing physician for further adjustment                            of therapy. Mauri Pole, MD 08/29/2018 8:32:36 AM This report has been signed electronically.

## 2018-08-29 NOTE — Progress Notes (Signed)
Called to room to assist during endoscopic procedure.  Patient ID and intended procedure confirmed with present staff. Received instructions for my participation in the procedure from the performing physician.  

## 2018-08-29 NOTE — Progress Notes (Signed)
A/ox3, pleased with MAC, report to RN 

## 2018-08-29 NOTE — Patient Instructions (Signed)
Information on polyps and hemorrhoids given to you today.  Await pathology results.  Resume Plavix at prior dose today.  YOU HAD AN ENDOSCOPIC PROCEDURE TODAY AT [facility]:   Refer to the procedure report that was given to you for any specific questions about what was found during the examination.  If the procedure report does not answer your questions, please call your gastroenterologist to clarify.  If you requested that your care partner not be given the details of your procedure findings, then the procedure report has been included in a sealed envelope for you to review at your convenience later.  YOU SHOULD EXPECT: Some feelings of bloating in the abdomen. Passage of more gas than usual.  Walking can help get rid of the air that was put into your GI tract during the procedure and reduce the bloating. If you had a lower endoscopy (such as a colonoscopy or flexible sigmoidoscopy) you may notice spotting of blood in your stool or on the toilet paper. If you underwent a bowel prep for your procedure, you may not have a normal bowel movement for a few days.  Please Note:  You might notice some irritation and congestion in your nose or some drainage.  This is from the oxygen used during your procedure.  There is no need for concern and it should clear up in a day or so.  SYMPTOMS TO REPORT IMMEDIATELY:   Following lower endoscopy (colonoscopy or flexible sigmoidoscopy):  Excessive amounts of blood in the stool  Significant tenderness or worsening of abdominal pains  Swelling of the abdomen that is new, acute  Fever of 100F or higher    For urgent or emergent issues, a gastroenterologist can be reached at any hour by calling [phone].   DIET:  We do recommend a small meal at first, but then you may proceed to your regular diet.  Drink plenty of fluids but you should avoid alcoholic beverages for 24 hours.  ACTIVITY:  You should plan to take it easy for the rest of  today and you should NOT DRIVE or use heavy machinery until tomorrow (because of the sedation medicines used during the test).    FOLLOW UP: Our staff will call the number listed on your records 48-72 hours following your procedure to check on you and address any questions or concerns that you may have regarding the information given to you following your procedure. If we do not reach you, we will leave a message.  We will attempt to reach you two times.  During this call, we will ask if you have developed any symptoms of COVID 19. If you develop any symptoms (ie: fever, flu-like symptoms, shortness of breath, cough etc.) before then, please call [phone].  If you test positive for Covid 19 in the 2 weeks post procedure, please call and report this information to [place].    If any biopsies were taken you will be contacted by phone or by letter within the next 1-3 weeks.  Please call us at [phone] if you have not heard about the biopsies in 3 weeks.    SIGNATURES/CONFIDENTIALITY: You and/or your care partner have signed paperwork which will be entered into your electronic medical record.  These signatures attest to the fact that that the information above on your After Visit Summary has been reviewed and is understood.  Full responsibility of the confidentiality of this discharge information lies with you and/or your care-partner.

## 2018-08-30 ENCOUNTER — Other Ambulatory Visit (INDEPENDENT_AMBULATORY_CARE_PROVIDER_SITE_OTHER): Payer: Medicare Other

## 2018-08-30 ENCOUNTER — Other Ambulatory Visit: Payer: Self-pay

## 2018-08-30 ENCOUNTER — Other Ambulatory Visit: Payer: Self-pay | Admitting: *Deleted

## 2018-08-30 ENCOUNTER — Ambulatory Visit
Admission: RE | Admit: 2018-08-30 | Discharge: 2018-08-30 | Disposition: A | Discharge status: Home / Self Care | Payer: Medicare Other | Source: Ambulatory Visit | Attending: Obstetrics and Gynecology | Admitting: Obstetrics and Gynecology

## 2018-08-30 ENCOUNTER — Ambulatory Visit: Payer: Medicare Other

## 2018-08-30 DIAGNOSIS — R42 Dizziness and giddiness: Secondary | ICD-10-CM

## 2018-08-30 DIAGNOSIS — R5383 Other fatigue: Secondary | ICD-10-CM

## 2018-08-30 DIAGNOSIS — R928 Other abnormal and inconclusive findings on diagnostic imaging of breast: Secondary | ICD-10-CM

## 2018-08-30 DIAGNOSIS — R922 Inconclusive mammogram: Secondary | ICD-10-CM | POA: Diagnosis not present

## 2018-08-30 LAB — FECAL OCCULT BLOOD, IMMUNOCHEMICAL: Fecal Occult Bld: NEGATIVE

## 2018-08-30 NOTE — Telephone Encounter (Signed)
Colon completed

## 2018-09-02 ENCOUNTER — Telehealth: Payer: Self-pay | Admitting: *Deleted

## 2018-09-02 NOTE — Telephone Encounter (Signed)
  Follow up Call-  Call back number 08/29/2018  Post procedure Call Back phone  # [phone]  Permission to leave phone message Yes  Some recent data might be hidden     Patient questions:  Do you have a fever, pain , or abdominal swelling? No. Pain Score  0 *  Have you tolerated food without any problems? Yes.    Have you been able to return to your normal activities? Yes.    Do you have any questions about your discharge instructions: Diet   No. Medications  No. Follow up visit  No.  Do you have questions or concerns about your Care? No.  Actions: * If pain score is 4 or above: No action needed, pain <4.  1. Have you developed a fever since your procedure? no  2.   Have you had an respiratory symptoms (SOB or cough) since your procedure? no  3.   Have you tested positive for COVID 19 since your procedure no  4.   Have you had any family members/close contacts diagnosed with the COVID 19 since your procedure?  no   If yes to any of these questions please route to Joylene John, RN and Alphonsa Gin, Therapist, sports.

## 2018-09-02 NOTE — Telephone Encounter (Signed)
Left message on f/u call 

## 2018-09-03 ENCOUNTER — Encounter: Payer: Self-pay | Admitting: Gastroenterology

## 2018-09-06 ENCOUNTER — Other Ambulatory Visit: Payer: Self-pay | Admitting: Family Medicine

## 2018-09-06 ENCOUNTER — Telehealth: Payer: Self-pay | Admitting: Family Medicine

## 2018-09-06 DIAGNOSIS — M79604 Pain in right leg: Secondary | ICD-10-CM

## 2018-09-06 DIAGNOSIS — M79605 Pain in left leg: Secondary | ICD-10-CM

## 2018-09-06 DIAGNOSIS — I739 Peripheral vascular disease, unspecified: Secondary | ICD-10-CM

## 2018-09-06 NOTE — Telephone Encounter (Signed)
FYI

## 2018-09-06 NOTE — Telephone Encounter (Signed)
I have put the order in for ABI---(check circulation in the legs)

## 2018-09-06 NOTE — Telephone Encounter (Signed)
Pt states that she was to call back and give a 1 month update for PCP. States that her ears are feeling better, but her legs are still painful - not gotten any better at all.

## 2018-09-10 ENCOUNTER — Telehealth: Payer: Self-pay | Admitting: *Deleted

## 2018-09-10 NOTE — Telephone Encounter (Signed)
claudication

## 2018-09-10 NOTE — Addendum Note (Signed)
Addended by: Kem Boroughs D on: 09/10/2018 01:30 PM   Modules accepted: Orders

## 2018-09-10 NOTE — Telephone Encounter (Signed)
Imaging called about [place] with ABI and stated that they will need another diagnosis code other than leg pain for study.  I advised them that you were checking for circulation issues in the legs.  Is there any other code you would like to use?  Please call back [phone]

## 2018-09-10 NOTE — Telephone Encounter (Signed)
Order put back in and called imaging to let them know of change.

## 2018-09-16 ENCOUNTER — Encounter: Payer: Self-pay | Admitting: Adult Health

## 2018-09-16 ENCOUNTER — Other Ambulatory Visit: Payer: Self-pay

## 2018-09-16 ENCOUNTER — Ambulatory Visit (HOSPITAL_BASED_OUTPATIENT_CLINIC_OR_DEPARTMENT_OTHER)
Admission: RE | Admit: 2018-09-16 | Discharge: 2018-09-16 | Disposition: A | Discharge status: Home / Self Care | Payer: Medicare Other | Source: Ambulatory Visit | Attending: Family Medicine | Admitting: Family Medicine

## 2018-09-16 DIAGNOSIS — I739 Peripheral vascular disease, unspecified: Secondary | ICD-10-CM | POA: Diagnosis not present

## 2018-09-16 NOTE — Progress Notes (Signed)
  Echocardiogram 2D Echocardiogram has been performed.  Connie Owens 09/16/2018, 11:58 AM

## 2018-09-17 ENCOUNTER — Ambulatory Visit (INDEPENDENT_AMBULATORY_CARE_PROVIDER_SITE_OTHER): Payer: Medicare Other | Admitting: Adult Health

## 2018-09-17 ENCOUNTER — Encounter: Payer: Self-pay | Admitting: Adult Health

## 2018-09-17 VITALS — BP 134/88 | HR 66 | Temp 97.8°F | Ht 65.0 in | Wt 208.2 lb

## 2018-09-17 DIAGNOSIS — G4733 Obstructive sleep apnea (adult) (pediatric): Secondary | ICD-10-CM | POA: Diagnosis not present

## 2018-09-17 DIAGNOSIS — Z9989 Dependence on other enabling machines and devices: Secondary | ICD-10-CM | POA: Diagnosis not present

## 2018-09-17 DIAGNOSIS — I2584 Coronary atherosclerosis due to calcified coronary lesion: Secondary | ICD-10-CM | POA: Diagnosis not present

## 2018-09-17 DIAGNOSIS — I2510 Atherosclerotic heart disease of native coronary artery without angina pectoris: Secondary | ICD-10-CM | POA: Diagnosis not present

## 2018-09-17 MED ORDER — ZOLPIDEM TARTRATE 5 MG PO TABS: 5.0000 mg | ORAL_TABLET | Freq: Every evening | ORAL | 5 refills | Status: AC | PRN

## 2018-09-17 NOTE — Progress Notes (Signed)
PATIENT: Connie Owens DOB: 07-Aug-1945  REASON FOR VISIT: follow up HISTORY FROM: patient  HISTORY OF PRESENT ILLNESS: Today 09/17/18:  Ms. Raben is a 73 year old female with a history of obstructive sleep apnea on CPAP.  Her CPAP download indicates that she used her machine 30 out of 30 days for compliance of 100%.  She used machine greater than 4 hours 26 days for compliance of 87%.  On average she uses the machine 6 hours and 11 minutes.  Her residual AHI is 1.3 on 5 to 15 cm of water with EPR of 3.  Her leak in the 95th percentile is 22 L/min.  She states that overall she is doing well with the CPAP.  She does state that some nights the pressure seems to be too strong.  She is wondering if we can decrease her pressure.  She reports that she still has issues with insomnia.  She has been taking Ambien 5 mg at bedtime.  She would like to continue this if possible.  She returns today for an evaluation.  HISTORY (Copied from Dr.Dohmeier's note) 04/17/18: Today's compliance shows 70% for over 4 hours with an average of 5 hours 28 minutes, she is using an S9 AutoSet but is not auto titration capable, has a  set pressure at 6 cm and expiratory relief pressure at 3 cmH2O there is a residual AHI of 1.1/h. Some nights she has major air leaks which increase the apnea count by unknown events 0.5/h.  Overall her apnea control is excellent. Machine is 73 years old, and she is a mild apnea patient - she likes to sleep with CPAP as she continues to feel better with it's use at night, but she gets entangled.   REVIEW OF SYSTEMS: Out of a complete 14 system review of symptoms, the patient complains only of the following symptoms, and all other reviewed systems are negative.  Epworth sleepiness score 13 fatigue severity score 46  ALLERGIES: Allergies  Allergen Reactions  . Iohexol Rash    rash  . Iodinated Diagnostic Agents Other (See Comments) and Rash    Other Reaction: Other reaction  . Statins  Other (See Comments)    Cause muscle aches/pains  . Imdur [Isosorbide Nitrate]     Severe headaches  . Latex Rash  . Tape Rash and Other (See Comments)    Plastic tap-blisters  . Verapamil Other (See Comments)    dizziness    HOME MEDICATIONS: Outpatient Medications Prior to Visit  Medication Sig Dispense Refill  . aspirin 81 MG tablet Take 81 mg by mouth daily.    [name] azelastine (ASTELIN) 0.1 % nasal spray Place 2 sprays into both nostrils 2 (two) times daily as needed for rhinitis or allergies. 30 mL 1  . brimonidine-timolol (COMBIGAN) 0.2-0.5 % ophthalmic solution Place 1 drop into the left eye 2 (two) times daily. Place 1 drop into left eye twice daily (morning & evening)    . carvedilol (COREG) 6.25 MG tablet Take 1 tablet (6.25 mg total) by mouth 2 (two) times daily. 180 tablet 2  . clobetasol ointment (TEMOVATE) 8.24 % Apply 1 application topically 2 (two) times daily. 90 g 1  . clopidogrel (PLAVIX) 75 MG tablet Take 1 tablet (75 mg total) by mouth daily. 90 tablet 2  . estradiol (ESTRACE) 1 MG tablet Take 1 mg by mouth at bedtime.     . Evolocumab (REPATHA SURECLICK) 235 MG/ML SOAJ Inject 140 mg into the skin every 14 (fourteen) days.  6 pen 4  . Evolocumab (REPATHA) 140 MG/ML SOSY Inject into the skin.    . fluticasone (FLONASE) 50 MCG/ACT nasal spray Place 2 sprays into both nostrils daily. 16 g 6  . gabapentin (NEURONTIN) 100 MG capsule Take 4 capsules (400 mg total) by mouth 3 (three) times daily. 4 po tid for pain 1080 capsule 1  . isosorbide mononitrate (IMDUR) 30 MG 24 hr tablet TAKE 1 TABLET(30 MG) BY MOUTH DAILY 92 tablet 1  . latanoprost (XALATAN) 0.005 % ophthalmic solution Place 1 drop into both eyes at bedtime.     [name] levocetirizine (XYZAL) 5 MG tablet Take 1 tablet (5 mg total) by mouth every evening. 30 tablet 5  . nitroGLYCERIN (NITROSTAT) 0.4 MG SL tablet Place 1 tablet (0.4 mg total) under the tongue every 5 (five) minutes x 3 doses as needed for chest pain. 25  tablet 3  . nortriptyline (PAMELOR) 10 MG capsule Use between 2 and 5 capsules at night po , prn-depending on headache 450 capsule 3  . prednisoLONE acetate (PRED FORTE) 1 % ophthalmic suspension Place 1-2 drops into both eyes daily. 2 drops right eye daily and l drop left eye daily    . tiZANidine (ZANAFLEX) 4 MG tablet Take 4 mg by mouth every 8 (eight) hours as needed for muscle spasms.    [name] zolpidem (AMBIEN) 10 MG tablet TAKE 1/2 TABLET(5 MG) BY MOUTH AT BEDTIME AS NEEDED FOR SLEEP 15 tablet 5  . losartan (COZAAR) 100 MG tablet Take 1 tablet (100 mg total) by mouth daily. 90 tablet 3   No facility-administered medications prior to visit.     PAST MEDICAL HISTORY: Past Medical History:  Diagnosis Date  . Anemia   . Anxiety state    NOS  . Arthritis    osteo...right knee  . CAD (coronary artery disease)    NSTEMI 8/13 => LHC 09/27/11: oLM 50%, dLM 30%, oLAD 60-70%, pLAD 50%, mLAD 70%, pD1 30%, pD2 40%, oRCA 25%, mRCA 40%, EF 50% with ant HK => LAD not amenable to PCI => med Rx  unless recurrent angina = > consider CABG  . Carpal tunnel syndrome   . Cataract   . Complication of anesthesia   . Corneal disorder   . Diverticular disease   . Dysmetabolic syndrome X   . GERD (gastroesophageal reflux disease)   . 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   [name] Headache, paroxysmal hemicrania, chronic 07/23/2012    Referral to BOTOX evaluation with dr Rexene Alberts or Krista Blue .  [name] Heart attack ([place]) 09/2011  . Hernia    hiatal  . HLD (hyperlipidemia)   . Hx of adenomatous colonic polyps   . Hypertension    essen. NOS  . Ischemic cardiomyopathy    a.  Echo 09/29/11: EF 30-35% with inferior, posterior, lateral AK, anterior severe HK, mild LVH, mild MR, PASP 40;  => b. follow up echo 10/13:  EF 55-60%, Gr 1 diast dysfn, mild MR  . Migraine headache without aura 07/23/2012    Left sided  Lamonte Sakai thought to be migrainous  , hypersensitivity to  touch , photophobia ,  no nausea.   [name] PONV (postoperative nausea and vomiting)   . Shoulder fracture   . Sleep apnea    CPAP    PAST SURGICAL HISTORY: Past Surgical History:  Procedure Laterality Date  . ABDOMINAL HYSTERECTOMY    . ABDOMINOPLASTY  10/23/2016  .  ankle cystectomy Right   . APPENDECTOMY    . BRACHIOPLASTY Bilateral 05/02/2016  . BREAST EXCISIONAL BIOPSY Right 02/15/1994  . BREAST LUMPECTOMY Right 1996  . BUNIONECTOMY Right   . CARDIAC CATHETERIZATION  09/28/2011   Dr Acie Fredrickson(  to see Dr Ron Parker)  . CHOLECYSTECTOMY    . COLONOSCOPY W/ POLYPECTOMY    . 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 CORNEAL TRANSPLANT Left    x1  . HEMORRHOID SURGERY  12/01/2010, 2014   [place] hemorrhoid ligation/pexy  . LEFT HEART CATHETERIZATION WITH CORONARY ANGIOGRAM N/A 09/28/2011   Procedure: LEFT HEART CATHETERIZATION WITH CORONARY ANGIOGRAM;  Surgeon: Minus Breeding, MD;  [facility] C[facility] LAB;  Service: Cardiovascular;  Laterality: N/A;  . lumbar spur removed    . rectocele surgery    . SHOULDER OPEN ROTATOR CUFF REPAIR Right 10/31/2017   Procedure: Right rotator cuff repair with graft and anchor, acromioplasty;  Surgeon: Latanya Maudlin, MD;  Location: [facility] ORS;  Service: Orthopedics;  Laterality: Right;  . TOTAL KNEE ARTHROPLASTY Right     FAMILY HISTORY: Family History  Problem Relation Age of Onset  . Breast cancer Mother   . Heart disease Father   . Stroke Father   . Dementia Father   . Prostate cancer Father   . Diabetes Sister        3 sisters   . Heart disease Sister        1 sister CABG  . Breast cancer Sister        PTE pre & post Dx of ca  . Diabetes Paternal Grandfather   . Colon cancer Maternal Uncle     SOCIAL HISTORY: Social History   Socioeconomic History  . Marital status: Widowed    Spouse name: Ruthann Cancer  . Number of children: 3  . Years of education: 47  . Highest education level: Not on file   Occupational History  . 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 Financial resource strain: Not on file  . Food insecurity    Worry: Not on file    Inability: Not on file  . Transportation needs    Medical: Not on file    Non-medical: Not on file  Tobacco Use  . Smoking status: Never Smoker  . Smokeless tobacco: Never Used  Substance and Sexual Activity  . Alcohol use: Yes    Comment: rare  . Drug use: No  . Sexual activity: Not on file  Lifestyle  . Physical activity    Days per week: Not on file    Minutes per session: Not on file  . Stress: Not on file  Relationships  . Social Herbalist on phone: Not on file    Gets together: Not on file    Attends religious service: Not on file    Active member of club or organization: Not on file    Attends meetings of clubs or organizations: Not on file    Relationship status: Not on file  . Intimate partner violence    Fear of current or ex partner: Not on file    Emotionally abused: Not on file    Physically abused: Not on file    Forced sexual activity: Not on file  Other Topics Concern  . Not on file  Social History Narrative   Patient is widowed,([place] past 10/02/13) and lives at home  with her husband.   Patient has three adult children.   Patient has a college education.   Patient is right-handed.   Patient drinks two cups of coffee daily.   Regular exercise   Patient is retired.      PHYSICAL EXAM  Vitals:   09/17/18 0831  BP: 134/88  Pulse: 66  Temp: 97.8 F (36.6 C)  Weight: 208 lb 3.2 oz (94.4 kg)  Height: 5\' 5"  (1.651 m)   Body mass index is 34.65 kg/m.  Generalized: Well developed, in no acute distress  Chest: Lungs clear to auscultation bilaterally  Neurological examination  Mentation: Alert oriented to time, place, history taking. Follows all commands speech and language fluent Cranial nerve II-XII: Extraocular movements were full,  visual field were full on confrontational test. F Head turning and shoulder shrug  were normal and symmetric. Motor: The motor testing reveals 5 over 5 strength of all 4 extremities. Good symmetric motor tone is noted throughout.  Sensory: Sensory testing is intact to soft touch on all 4 extremities. No evidence of extinction is noted.  Coordination: Cerebellar testing reveals good finger-nose-finger and heel-to-shin bilaterally.  Gait and station: Gait is normal.     DIAGNOSTIC DATA (LABS, IMAGING, TESTING) - I reviewed patient records, labs, notes, testing and imaging myself where available.  Lab Results  Component Value Date   WBC 7.5 08/08/2018   HGB 11.8 (L) 08/08/2018   HCT 37.5 08/08/2018   MCV 78.2 08/08/2018   PLT 363.0 08/08/2018      Component Value Date/Time   NA 132 (L) 08/08/2018 1037   K 4.7 08/08/2018 1037   CL 98 08/08/2018 1037   CO2 25 08/08/2018 1037   GLUCOSE 116 (H) 08/08/2018 1037   BUN 14 08/08/2018 1037   CREATININE 0.72 08/08/2018 1037   CREATININE 0.87 11/17/2014 0844   CALCIUM 9.1 08/08/2018 1037   PROT 7.0 08/08/2018 1037   ALBUMIN 3.8 08/08/2018 1037   AST 15 08/08/2018 1037   ALT 14 08/08/2018 1037   ALKPHOS 76 08/08/2018 1037   BILITOT 0.3 08/08/2018 1037   GFRNONAA >60 10/25/2017 1436   GFRNONAA 68 11/17/2014 0844   GFRAA >60 10/25/2017 1436   GFRAA 79 11/17/2014 0844   Lab Results  Component Value Date   CHOL 132 04/09/2018   HDL 59 04/09/2018   LDLCALC 45 04/09/2018   LDLDIRECT 140.8 03/18/2013   TRIG 141 04/09/2018   CHOLHDL 2.2 04/09/2018   Lab Results  Component Value Date   HGBA1C 5.9 12/29/2013   Lab Results  Component Value Date   VITAMINB12 291 03/18/2013   Lab Results  Component Value Date   TSH 3.67 08/08/2018      ASSESSMENT AND PLAN 73 y.o. year old female  has a past medical history of Anemia, Anxiety state, Arthritis, CAD (coronary artery disease), Carpal tunnel syndrome, Cataract, Complication of  anesthesia, Corneal disorder, Diverticular disease, Dysmetabolic syndrome X, GERD (gastroesophageal reflux disease), Glaucoma (increased eye pressure) (07/30/2013), Headache, paroxysmal hemicrania, chronic (07/23/2012), Heart attack ([place]) (09/2011), Hernia, HLD (hyperlipidemia), adenomatous colonic polyps, Hypertension, Ischemic cardiomyopathy, Migraine headache without aura (07/23/2012), PONV (postoperative nausea and vomiting), Shoulder fracture, and Sleep apnea. here with:  1.  Obstructive sleep apnea on CPAP  Patient CPAP download shows excellent compliance and good treatment of her apnea.  She is encouraged to continue using CPAP nightly and greater than 4 hours each night.  I will decrease her pressure to 5 to 14 cm of water.  She is advised  that if her symptoms worsen or she develops new symptoms she should let us know.  She will follow-up in 1 year or sooner if needed    I spent 15 minutes with the patient. 50% of this time was spent reviewing CPAP download   Ward Givens, MSN, NP-C 09/17/2018, 8:36 AM [facility] [address] [place], [place] [address] [phone]

## 2018-09-17 NOTE — Patient Instructions (Signed)
Continue using CPAP nightly and greater than 4 hours each night Pressure will decrease to 5-14 cm H20 If your symptoms worsen or you develop new symptoms please let us know.

## 2018-09-17 NOTE — Progress Notes (Signed)
Fax confirmation received Shelby Dubin [phone].

## 2018-09-18 ENCOUNTER — Encounter: Payer: Self-pay | Admitting: Family Medicine

## 2018-09-18 ENCOUNTER — Ambulatory Visit (INDEPENDENT_AMBULATORY_CARE_PROVIDER_SITE_OTHER): Payer: Medicare Other | Admitting: Family Medicine

## 2018-09-18 ENCOUNTER — Other Ambulatory Visit: Payer: Self-pay

## 2018-09-18 VITALS — BP 134/88 | Temp 97.8°F | Wt 208.0 lb

## 2018-09-18 DIAGNOSIS — R300 Dysuria: Secondary | ICD-10-CM

## 2018-09-18 DIAGNOSIS — I2510 Atherosclerotic heart disease of native coronary artery without angina pectoris: Secondary | ICD-10-CM | POA: Diagnosis not present

## 2018-09-18 DIAGNOSIS — I2584 Coronary atherosclerosis due to calcified coronary lesion: Secondary | ICD-10-CM | POA: Diagnosis not present

## 2018-09-18 MED ORDER — CEPHALEXIN 500 MG PO CAPS: 500.0000 mg | ORAL_CAPSULE | Freq: Two times a day (BID) | ORAL | 0 refills | Status: DC

## 2018-09-18 MED ORDER — PHENAZOPYRIDINE HCL 200 MG PO TABS: 200.0000 mg | ORAL_TABLET | Freq: Three times a day (TID) | ORAL | 0 refills | Status: AC | PRN

## 2018-09-18 NOTE — Progress Notes (Signed)
Virtual Visit via Telephone Note  I connected with Connie Owens on 09/18/18 at 10:00 AM EDT by telephone and verified that I am speaking with the correct person using two identifiers.  Location: Patient: home  Provider: home    I discussed the limitations, risks, security and privacy concerns of performing an evaluation and management service by telephone and the availability of in person appointments. I also discussed with the patient that there may be a patient responsible charge related to this service. The patient expressed understanding and agreed to proceed.   History of Present Illness: Pt is home c/o low abd pressure and urinary frequency with dysuria since Sunday am.    She started AZO and cranberry juice with little relief  No fevers No flank pain  No vag d/c or hematuria   Past Medical History:  Diagnosis Date  . Anemia   . Anxiety state    NOS  . Arthritis    osteo...right knee  . CAD (coronary artery disease)    NSTEMI 8/13 => LHC 09/27/11: oLM 50%, dLM 30%, oLAD 60-70%, pLAD 50%, mLAD 70%, pD1 30%, pD2 40%, oRCA 25%, mRCA 40%, EF 50% with ant HK => LAD not amenable to PCI => med Rx  unless recurrent angina = > consider CABG  . Carpal tunnel syndrome   . Cataract   . Complication of anesthesia   . Corneal disorder   . Diverticular disease   . Dysmetabolic syndrome X   . GERD (gastroesophageal reflux disease)   . 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   [name] Headache, paroxysmal hemicrania, chronic 07/23/2012    Referral to BOTOX evaluation with dr Rexene Alberts or Krista Blue .  [name] Heart attack ([place]) 09/2011  . Hernia    hiatal  . HLD (hyperlipidemia)   . Hx of adenomatous colonic polyps   . Hypertension    essen. NOS  . Ischemic cardiomyopathy    a.  Echo 09/29/11: EF 30-35% with inferior, posterior, lateral AK, anterior severe HK, mild LVH, mild MR, PASP 40;  => b. follow up echo 10/13:  EF 55-60%, Gr 1  diast dysfn, mild MR  . Migraine headache without aura 07/23/2012    Left sided  Lamonte Sakai thought to be migrainous  , hypersensitivity to touch , photophobia ,  no nausea.   [name] PONV (postoperative nausea and vomiting)   . Shoulder fracture   . Sleep apnea    CPAP   Current Outpatient Medications on File Prior to Visit  Medication Sig Dispense Refill  . aspirin 81 MG tablet Take 81 mg by mouth daily.    [name] azelastine (ASTELIN) 0.1 % nasal spray Place 2 sprays into both nostrils 2 (two) times daily as needed for rhinitis or allergies. 30 mL 1  . brimonidine-timolol (COMBIGAN) 0.2-0.5 % ophthalmic solution Place 1 drop into the left eye 2 (two) times daily. Place 1 drop into left eye twice daily (morning & evening)    . carvedilol (COREG) 6.25 MG tablet Take 1 tablet (6.25 mg total) by mouth 2 (two) times daily. 180 tablet 2  . clobetasol ointment (TEMOVATE) 9.47 % Apply 1 application topically 2 (two) times daily. 90 g 1  . clopidogrel (PLAVIX) 75 MG tablet Take 1 tablet (75 mg total) by mouth daily. 90 tablet 2  . estradiol (ESTRACE) 1 MG tablet Take 1 mg by mouth at bedtime.     . Evolocumab (REPATHA SURECLICK) 096  MG/ML SOAJ Inject 140 mg into the skin every 14 (fourteen) days. 6 pen 4  . Evolocumab (REPATHA) 140 MG/ML SOSY Inject into the skin.    . fluticasone (FLONASE) 50 MCG/ACT nasal spray Place 2 sprays into both nostrils daily. 16 g 6  . gabapentin (NEURONTIN) 100 MG capsule Take 4 capsules (400 mg total) by mouth 3 (three) times daily. 4 Owens tid for pain 1080 capsule 1  . isosorbide mononitrate (IMDUR) 30 MG 24 hr tablet TAKE 1 TABLET(30 MG) BY MOUTH DAILY 92 tablet 1  . latanoprost (XALATAN) 0.005 % ophthalmic solution Place 1 drop into both eyes at bedtime.     [name] levocetirizine (XYZAL) 5 MG tablet Take 1 tablet (5 mg total) by mouth every evening. 30 tablet 5  . nitroGLYCERIN (NITROSTAT) 0.4 MG SL tablet Place 1 tablet (0.4 mg total) under the tongue every 5 (five) minutes x 3 doses as  needed for chest pain. 25 tablet 3  . nortriptyline (PAMELOR) 10 MG capsule Use between 2 and 5 capsules at night Owens , prn-depending on headache 450 capsule 3  . prednisoLONE acetate (PRED FORTE) 1 % ophthalmic suspension Place 1-2 drops into both eyes daily. 2 drops right eye daily and l drop left eye daily    . tiZANidine (ZANAFLEX) 4 MG tablet Take 4 mg by mouth every 8 (eight) hours as needed for muscle spasms.    [name] zolpidem (AMBIEN) 5 MG tablet Take 1 tablet (5 mg total) by mouth at bedtime as needed for sleep. 30 tablet 5  . losartan (COZAAR) 100 MG tablet Take 1 tablet (100 mg total) by mouth daily. 90 tablet 3   No current facility-administered medications on file prior to visit.     Observations/Objective: Vitals:   09/18/18 0910  BP: 134/88  Temp: 97.8 F (36.6 C)    Pt is in NAD  Assessment and Plan: 1. Dysuria Suspect UTi Pt will start meds per orders and call if no relief to check UA, culture  - cephALEXin (KEFLEX) 500 MG capsule; Take 1 capsule (500 mg total) by mouth 2 (two) times daily.  Dispense: 14 capsule; Refill: 0 - phenazopyridine (PYRIDIUM) 200 MG tablet; Take 1 tablet (200 mg total) by mouth 3 (three) times daily as needed for pain.  Dispense: 6 tablet; Refill: 0   Follow Up Instructions:    I discussed the assessment and treatment plan with the patient. The patient was provided an opportunity to ask questions and all were answered. The patient agreed with the plan and demonstrated an understanding of the instructions.   The patient was advised to call back or seek an in-person evaluation if the symptoms worsen or if the condition fails to improve as anticipated.  I provided 15 minutes of non-face-to-face time during this encounter.   Ann Held, DO

## 2018-09-18 NOTE — Progress Notes (Signed)
Received cpap orders [facility].  sy

## 2018-09-24 ENCOUNTER — Telehealth: Payer: Self-pay

## 2018-09-24 NOTE — Telephone Encounter (Signed)
Pt requested Disability Placard. Placard placed in mail due to patient not having a ride.

## 2018-09-24 NOTE — Telephone Encounter (Signed)
[place]

## 2018-10-01 ENCOUNTER — Telehealth: Payer: Self-pay

## 2018-10-01 NOTE — Telephone Encounter (Signed)
Copied from [place] [id number]. Topic: General - Other >> Sep 23, 2018  9:59 AM Celene Kras A wrote: Reason for CRM: Pt callin regarding ultrasound results. Please advise. >> Sep 25, 2018 10:35 AM Alanda Slim E wrote: Pt called to speak with someone about her ultrasound results/ please advise

## 2018-10-07 DIAGNOSIS — M7062 Trochanteric bursitis, left hip: Secondary | ICD-10-CM | POA: Diagnosis not present

## 2018-10-07 DIAGNOSIS — M1712 Unilateral primary osteoarthritis, left knee: Secondary | ICD-10-CM | POA: Diagnosis not present

## 2018-10-07 DIAGNOSIS — M7061 Trochanteric bursitis, right hip: Secondary | ICD-10-CM | POA: Diagnosis not present

## 2018-10-07 DIAGNOSIS — M545 Low back pain: Secondary | ICD-10-CM | POA: Diagnosis not present

## 2018-10-07 NOTE — Progress Notes (Signed)
HPI The patient presents for followup of her cardiomyopathy and coronary disease that we are managing medically. She had a non-Q-wave myocardial infarction in the past.  At that time her ejection fraction by echo was 30-35% though it was higher by cath. Follow up echo demonstrated her EF to be 60-65% on the last echo in Jan.  She had a negative POET (Plain Old Exercise Treadmill) in 2018.   We have been unable to titrate meds and her beta blocker was reduced secondary to low BPs.  She returns for follow up.   At a visit earlier this year she had some chest pain that was not thought to be anginal.    However, now she returned and she says this discomfort is much more intense.  She is having discomfort with any activity and sometimes at rest.  She describes it as substernal.  It is an aching discomfort somewhat up into her jaw and down to her arms.  She did get Imdur at the last visit she thought that helped for a little bit but the discomfort is getting more intense and more frequent.  She is having decreased exercise tolerance.  She is not having any new shortness of breath, PND or orthopnea.  She is not having any new palpitations, presyncope or syncope.  She is had no cough fevers or chills.  Of note she had some anemia but she had colonoscopy and some polyps removed but no other clear source of blood loss.  Stool guaiac was negative 1 month ago.   Allergies  Allergen Reactions  . Iohexol Rash    rash  . Iodinated Diagnostic Agents Other (See Comments) and Rash    Other Reaction: Other reaction  . Statins Other (See Comments)    Cause muscle aches/pains  . Imdur [Isosorbide Nitrate]     Severe headaches  . Latex Rash  . Tape Rash and Other (See Comments)    Plastic tap-blisters  . Verapamil Other (See Comments)    dizziness    Current Outpatient Medications  Medication Sig Dispense Refill  . aspirin 81 MG tablet Take 81 mg by mouth daily.    [name] azelastine (ASTELIN) 0.1 % nasal  spray Place 2 sprays into both nostrils 2 (two) times daily as needed for rhinitis or allergies. 30 mL 1  . brimonidine-timolol (COMBIGAN) 0.2-0.5 % ophthalmic solution Place 1 drop into the left eye 2 (two) times daily. Place 1 drop into left eye twice daily (morning & evening)    . carvedilol (COREG) 6.25 MG tablet Take 1 tablet (6.25 mg total) by mouth 2 (two) times daily. 180 tablet 2  . clobetasol ointment (TEMOVATE) [id number] % Apply 1 application topically 2 (two) times daily. 90 g 1  . clopidogrel (PLAVIX) 75 MG tablet Take 1 tablet (75 mg total) by mouth daily. 90 tablet 2  . estradiol (ESTRACE) 1 MG tablet Take 1 mg by mouth at bedtime.     . Evolocumab (REPATHA SURECLICK) [id number] MG/ML SOAJ Inject 140 mg into the skin every 14 (fourteen) days. 6 pen 4  . Evolocumab (REPATHA) 140 MG/ML SOSY Inject into the skin.    . fluticasone (FLONASE) 50 MCG/ACT nasal spray Place 2 sprays into both nostrils daily. 16 g 6  . gabapentin (NEURONTIN) 100 MG capsule Take 4 capsules (400 mg total) by mouth 3 (three) times daily. 4 po tid for pain 1080 capsule 1  . isosorbide mononitrate (IMDUR) 120 MG 24 hr tablet Take 1 tablet (120  mg total) by mouth daily. 30 tablet 5  . latanoprost (XALATAN) 0.005 % ophthalmic solution Place 1 drop into both eyes at bedtime.     [name] levocetirizine (XYZAL) 5 MG tablet Take 1 tablet (5 mg total) by mouth every evening. 30 tablet 5  . nitroGLYCERIN (NITROSTAT) 0.4 MG SL tablet Place 1 tablet (0.4 mg total) under the tongue every 5 (five) minutes x 3 doses as needed for chest pain. 25 tablet 3  . nortriptyline (PAMELOR) 10 MG capsule Use between 2 and 5 capsules at night po , prn-depending on headache 450 capsule 3  . phenazopyridine (PYRIDIUM) 200 MG tablet Take 1 tablet (200 mg total) by mouth 3 (three) times daily as needed for pain. 6 tablet 0  . prednisoLONE acetate (PRED FORTE) 1 % ophthalmic suspension Place 1-2 drops into both eyes daily. 2 drops right eye daily and l drop left  eye daily    . tiZANidine (ZANAFLEX) 4 MG tablet Take 4 mg by mouth every 8 (eight) hours as needed for muscle spasms.    [name] zolpidem (AMBIEN) 5 MG tablet Take 1 tablet (5 mg total) by mouth at bedtime as needed for sleep. 30 tablet 5  . losartan (COZAAR) 100 MG tablet Take 1 tablet (100 mg total) by mouth daily. 90 tablet 3  . predniSONE (DELTASONE) 50 MG tablet TAKE 1 TAB 13 HOURS PRIOR, 7 HOURS PRIOR, AND PRIOR TO LEAVING FOR PROCEDURE 3 tablet 0   No current facility-administered medications for this visit.     Past Medical History:  Diagnosis Date  . Anemia   . Anxiety state    NOS  . Arthritis    osteo...right knee  . CAD (coronary artery disease)    NSTEMI 8/13 => LHC 09/27/11: oLM 50%, dLM 30%, oLAD 60-70%, pLAD 50%, mLAD 70%, pD1 30%, pD2 40%, oRCA 25%, mRCA 40%, EF 50% with ant HK => LAD not amenable to PCI => med Rx  unless recurrent angina = > consider CABG  . Carpal tunnel syndrome   . Cataract   . Complication of anesthesia   . Corneal disorder   . Diverticular disease   . Dysmetabolic syndrome X   . GERD (gastroesophageal reflux disease)   . 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   [name] Headache, paroxysmal hemicrania, chronic 07/23/2012    Referral to BOTOX evaluation with dr Rexene Alberts or Krista Blue .  [name] Heart attack ([place]) 09/2011  . Hernia    hiatal  . HLD (hyperlipidemia)   . Hx of adenomatous colonic polyps   . Hypertension    essen. NOS  . Ischemic cardiomyopathy    a.  Echo 09/29/11: EF 30-35% with inferior, posterior, lateral AK, anterior severe HK, mild LVH, mild MR, PASP 40;  => b. follow up echo 10/13:  EF 55-60%, Gr 1 diast dysfn, mild MR  . Migraine headache without aura 07/23/2012    Left sided  Lamonte Sakai thought to be migrainous  , hypersensitivity to touch , photophobia ,  no nausea.   [name] PONV (postoperative nausea and vomiting)   . Shoulder fracture   . Sleep apnea    CPAP    Past Surgical History:   Procedure Laterality Date  . ABDOMINAL HYSTERECTOMY    . ABDOMINOPLASTY  10/23/2016  . ankle cystectomy Right   . APPENDECTOMY    . BRACHIOPLASTY Bilateral 05/02/2016  . BREAST EXCISIONAL BIOPSY Right 02/15/1994  . BREAST LUMPECTOMY  Right 1996  . BUNIONECTOMY Right   . CARDIAC CATHETERIZATION  09/28/2011   Dr Acie Fredrickson(  to see Dr Ron Parker)  . CHOLECYSTECTOMY    . COLONOSCOPY W/ POLYPECTOMY    . 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 CORNEAL TRANSPLANT Left    x1  . HEMORRHOID SURGERY  12/01/2010, 2014   [place] hemorrhoid ligation/pexy  . LEFT HEART CATHETERIZATION WITH CORONARY ANGIOGRAM N/A 09/28/2011   Procedure: LEFT HEART CATHETERIZATION WITH CORONARY ANGIOGRAM;  Surgeon: Minus Breeding, MD;  [facility] C[facility] LAB;  Service: Cardiovascular;  Laterality: N/A;  . lumbar spur removed    . rectocele surgery    . SHOULDER OPEN ROTATOR CUFF REPAIR Right 10/31/2017   Procedure: Right rotator cuff repair with graft and anchor, acromioplasty;  Surgeon: Latanya Maudlin, MD;  Location: [facility] ORS;  Service: Orthopedics;  Laterality: Right;  . TOTAL KNEE ARTHROPLASTY Right     ROS:    As stated in the HPI and negative for all other systems.  PHYSICAL EXAM BP 127/75   Pulse 88   Ht 5\' 5"  (1.651 m)   Wt 206 lb 9.6 oz (93.7 kg)   SpO2 97%   BMI 34.38 kg/m   GENERAL:  Well appearing HEENT:  Pupils equal round and reactive, fundi not visualized, oral mucosa unremarkable NECK:  No jugular venous distention, waveform within normal limits, carotid upstroke brisk and symmetric, no bruits, no thyromegaly LYMPHATICS:  No cervical, inguinal adenopathy LUNGS:  Clear to auscultation bilaterally BACK:  No CVA tenderness CHEST:  Unremarkable HEART:  PMI not displaced or sustained,S1 and S2 within normal limits, no S3, no S4, no clicks, no rubs, no murmurs ABD:  Flat, positive bowel sounds normal in frequency in pitch, no bruits, no rebound, no guarding, no  midline pulsatile mass, no hepatomegaly, no splenomegaly EXT:  2 plus pulses throughout, no edema, no cyanosis no clubbing SKIN:  No rashes no nodules NEURO:  Cranial nerves II through XII grossly intact, motor grossly intact throughout PSYCH:  Cognitively intact, oriented to person place and time   EKG: Sinus rhythm, rate 81, axis within normal limits, intervals within normal limits, no acute ST-T wave changes.  Lab Results  Component Value Date   CHOL 132 04/09/2018   TRIG 141 04/09/2018   HDL 59 04/09/2018   LDLCALC 45 04/09/2018   LDLDIRECT 140.8 03/18/2013    ASSESSMENT AND PLAN  Coronary Artery Disease:  The patient is having symptoms consistent with increasing or unstable angina.  I am going to increase her Imdur to 120 mg daily.  She is on reasonable therapy and now cardiac catheterization is indicated.  She has had some distal vessel disease previously not amenable to PCI but she might have a new lesion or even require CABG based on these findings.  The patient understands that risks included but are not limited to stroke (1 in 1000), death (1 in 62), kidney failure [usually temporary] (1 in 500), bleeding (1 in 200), allergic reaction [possibly serious] (1 in 200).  The patient understands and agrees to proceed.   Ischemic Cardiomyopathy:  Her EF was normal in January last year.  Should be evaluated as above.   Hyperlipidemia:  She has not tolerated statin.  She could not tolerate Zetia.  She is now on Repatha.   Hypertension:    The blood pressure is at target.   Tobacco:

## 2018-10-07 NOTE — H&P (View-Only) (Signed)
HPI The patient presents for followup of her cardiomyopathy and coronary disease that we are managing medically. She had a non-Q-wave myocardial infarction in the past.  At that time her ejection fraction by echo was 30-35% though it was higher by cath. Follow up echo demonstrated her EF to be 60-65% on the last echo in Jan.  She had a negative POET (Plain Old Exercise Treadmill) in 2018.   We have been unable to titrate meds and her beta blocker was reduced secondary to low BPs.  She returns for follow up.   At a visit earlier this year she had some chest pain that was not thought to be anginal.    However, now she returned and she says this discomfort is much more intense.  She is having discomfort with any activity and sometimes at rest.  She describes it as substernal.  It is an aching discomfort somewhat up into her jaw and down to her arms.  She did get Imdur at the last visit she thought that helped for a little bit but the discomfort is getting more intense and more frequent.  She is having decreased exercise tolerance.  She is not having any new shortness of breath, PND or orthopnea.  She is not having any new palpitations, presyncope or syncope.  She is had no cough fevers or chills.  Of note she had some anemia but she had colonoscopy and some polyps removed but no other clear source of blood loss.  Stool guaiac was negative 1 month ago.   Allergies  Allergen Reactions  . Iohexol Rash    rash  . Iodinated Diagnostic Agents Other (See Comments) and Rash    Other Reaction: Other reaction  . Statins Other (See Comments)    Cause muscle aches/pains  . Imdur [Isosorbide Nitrate]     Severe headaches  . Latex Rash  . Tape Rash and Other (See Comments)    Plastic tap-blisters  . Verapamil Other (See Comments)    dizziness    Current Outpatient Medications  Medication Sig Dispense Refill  . aspirin 81 MG tablet Take 81 mg by mouth daily.    [name] azelastine (ASTELIN) 0.1 % nasal  spray Place 2 sprays into both nostrils 2 (two) times daily as needed for rhinitis or allergies. 30 mL 1  . brimonidine-timolol (COMBIGAN) 0.2-0.5 % ophthalmic solution Place 1 drop into the left eye 2 (two) times daily. Place 1 drop into left eye twice daily (morning & evening)    . carvedilol (COREG) 6.25 MG tablet Take 1 tablet (6.25 mg total) by mouth 2 (two) times daily. 180 tablet 2  . clobetasol ointment (TEMOVATE) [id number] % Apply 1 application topically 2 (two) times daily. 90 g 1  . clopidogrel (PLAVIX) 75 MG tablet Take 1 tablet (75 mg total) by mouth daily. 90 tablet 2  . estradiol (ESTRACE) 1 MG tablet Take 1 mg by mouth at bedtime.     . Evolocumab (REPATHA SURECLICK) [id number] MG/ML SOAJ Inject 140 mg into the skin every 14 (fourteen) days. 6 pen 4  . Evolocumab (REPATHA) 140 MG/ML SOSY Inject into the skin.    . fluticasone (FLONASE) 50 MCG/ACT nasal spray Place 2 sprays into both nostrils daily. 16 g 6  . gabapentin (NEURONTIN) 100 MG capsule Take 4 capsules (400 mg total) by mouth 3 (three) times daily. 4 po tid for pain 1080 capsule 1  . isosorbide mononitrate (IMDUR) 120 MG 24 hr tablet Take 1 tablet (120  mg total) by mouth daily. 30 tablet 5  . latanoprost (XALATAN) 0.005 % ophthalmic solution Place 1 drop into both eyes at bedtime.     [name] levocetirizine (XYZAL) 5 MG tablet Take 1 tablet (5 mg total) by mouth every evening. 30 tablet 5  . nitroGLYCERIN (NITROSTAT) 0.4 MG SL tablet Place 1 tablet (0.4 mg total) under the tongue every 5 (five) minutes x 3 doses as needed for chest pain. 25 tablet 3  . nortriptyline (PAMELOR) 10 MG capsule Use between 2 and 5 capsules at night po , prn-depending on headache 450 capsule 3  . phenazopyridine (PYRIDIUM) 200 MG tablet Take 1 tablet (200 mg total) by mouth 3 (three) times daily as needed for pain. 6 tablet 0  . prednisoLONE acetate (PRED FORTE) 1 % ophthalmic suspension Place 1-2 drops into both eyes daily. 2 drops right eye daily and l drop left  eye daily    . tiZANidine (ZANAFLEX) 4 MG tablet Take 4 mg by mouth every 8 (eight) hours as needed for muscle spasms.    [name] zolpidem (AMBIEN) 5 MG tablet Take 1 tablet (5 mg total) by mouth at bedtime as needed for sleep. 30 tablet 5  . losartan (COZAAR) 100 MG tablet Take 1 tablet (100 mg total) by mouth daily. 90 tablet 3  . predniSONE (DELTASONE) 50 MG tablet TAKE 1 TAB 13 HOURS PRIOR, 7 HOURS PRIOR, AND PRIOR TO LEAVING FOR PROCEDURE 3 tablet 0   No current facility-administered medications for this visit.     Past Medical History:  Diagnosis Date  . Anemia   . Anxiety state    NOS  . Arthritis    osteo...right knee  . CAD (coronary artery disease)    NSTEMI 8/13 => LHC 09/27/11: oLM 50%, dLM 30%, oLAD 60-70%, pLAD 50%, mLAD 70%, pD1 30%, pD2 40%, oRCA 25%, mRCA 40%, EF 50% with ant HK => LAD not amenable to PCI => med Rx  unless recurrent angina = > consider CABG  . Carpal tunnel syndrome   . Cataract   . Complication of anesthesia   . Corneal disorder   . Diverticular disease   . Dysmetabolic syndrome X   . GERD (gastroesophageal reflux disease)   . 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   [name] Headache, paroxysmal hemicrania, chronic 07/23/2012    Referral to BOTOX evaluation with dr Rexene Alberts or Krista Blue .  [name] Heart attack ([place]) 09/2011  . Hernia    hiatal  . HLD (hyperlipidemia)   . Hx of adenomatous colonic polyps   . Hypertension    essen. NOS  . Ischemic cardiomyopathy    a.  Echo 09/29/11: EF 30-35% with inferior, posterior, lateral AK, anterior severe HK, mild LVH, mild MR, PASP 40;  => b. follow up echo 10/13:  EF 55-60%, Gr 1 diast dysfn, mild MR  . Migraine headache without aura 07/23/2012    Left sided  Lamonte Sakai thought to be migrainous  , hypersensitivity to touch , photophobia ,  no nausea.   [name] PONV (postoperative nausea and vomiting)   . Shoulder fracture   . Sleep apnea    CPAP    Past Surgical History:   Procedure Laterality Date  . ABDOMINAL HYSTERECTOMY    . ABDOMINOPLASTY  10/23/2016  . ankle cystectomy Right   . APPENDECTOMY    . BRACHIOPLASTY Bilateral 05/02/2016  . BREAST EXCISIONAL BIOPSY Right 02/15/1994  . BREAST LUMPECTOMY  Right 1996  . BUNIONECTOMY Right   . CARDIAC CATHETERIZATION  09/28/2011   Dr Acie Fredrickson(  to see Dr Ron Parker)  . CHOLECYSTECTOMY    . COLONOSCOPY W/ POLYPECTOMY    . 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 CORNEAL TRANSPLANT Left    x1  . HEMORRHOID SURGERY  12/01/2010, 2014   [place] hemorrhoid ligation/pexy  . LEFT HEART CATHETERIZATION WITH CORONARY ANGIOGRAM N/A 09/28/2011   Procedure: LEFT HEART CATHETERIZATION WITH CORONARY ANGIOGRAM;  Surgeon: Minus Breeding, MD;  Location: [facility] CATH LAB;  Service: Cardiovascular;  Laterality: N/A;  . lumbar spur removed    . rectocele surgery    . SHOULDER OPEN ROTATOR CUFF REPAIR Right 10/31/2017   Procedure: Right rotator cuff repair with graft and anchor, acromioplasty;  Surgeon: Latanya Maudlin, MD;  Location: [facility];  Service: Orthopedics;  Laterality: Right;  . TOTAL KNEE ARTHROPLASTY Right     ROS:    As stated in the HPI and negative for all other systems.  PHYSICAL EXAM BP 127/75   Pulse 88   Ht 5\' 5"  (1.651 m)   Wt 206 lb 9.6 oz (93.7 kg)   SpO2 97%   BMI 34.38 kg/m   GENERAL:  Well appearing HEENT:  Pupils equal round and reactive, fundi not visualized, oral mucosa unremarkable NECK:  No jugular venous distention, waveform within normal limits, carotid upstroke brisk and symmetric, no bruits, no thyromegaly LYMPHATICS:  No cervical, inguinal adenopathy LUNGS:  Clear to auscultation bilaterally BACK:  No CVA tenderness CHEST:  Unremarkable HEART:  PMI not displaced or sustained,S1 and S2 within normal limits, no S3, no S4, no clicks, no rubs, no murmurs ABD:  Flat, positive bowel sounds normal in frequency in pitch, no bruits, no rebound, no guarding, no  midline pulsatile mass, no hepatomegaly, no splenomegaly EXT:  2 plus pulses throughout, no edema, no cyanosis no clubbing SKIN:  No rashes no nodules NEURO:  Cranial nerves II through XII grossly intact, motor grossly intact throughout PSYCH:  Cognitively intact, oriented to person place and time   EKG: Sinus rhythm, rate 81, axis within normal limits, intervals within normal limits, no acute ST-T wave changes.  Lab Results  Component Value Date   CHOL 132 04/09/2018   TRIG 141 04/09/2018   HDL 59 04/09/2018   LDLCALC 45 04/09/2018   LDLDIRECT 140.8 03/18/2013    ASSESSMENT AND PLAN  Coronary Artery Disease:  The patient is having symptoms consistent with increasing or unstable angina.  I am going to increase her Imdur to 120 mg daily.  She is on reasonable therapy and now cardiac catheterization is indicated.  She has had some distal vessel disease previously not amenable to PCI but she might have a new lesion or even require CABG based on these findings.  The patient understands that risks included but are not limited to stroke (1 in 1000), death (1 in 49), kidney failure [usually temporary] (1 in 500), bleeding (1 in 200), allergic reaction [possibly serious] (1 in 200).  The patient understands and agrees to proceed.   Ischemic Cardiomyopathy:  Her EF was normal in January last year.  Should be evaluated as above.   Hyperlipidemia:  She has not tolerated statin.  She could not tolerate Zetia.  She is now on Repatha.   Hypertension:    The blood pressure is at target.   Tobacco:

## 2018-10-08 ENCOUNTER — Ambulatory Visit (INDEPENDENT_AMBULATORY_CARE_PROVIDER_SITE_OTHER): Payer: Medicare Other | Admitting: Cardiology

## 2018-10-08 ENCOUNTER — Other Ambulatory Visit: Payer: Self-pay

## 2018-10-08 ENCOUNTER — Encounter: Payer: Self-pay | Admitting: Cardiology

## 2018-10-08 ENCOUNTER — Other Ambulatory Visit (HOSPITAL_COMMUNITY): Payer: Medicare Other

## 2018-10-08 VITALS — BP 127/75 | HR 88 | Ht 65.0 in | Wt 206.6 lb

## 2018-10-08 DIAGNOSIS — I200 Unstable angina: Secondary | ICD-10-CM

## 2018-10-08 DIAGNOSIS — I10 Essential (primary) hypertension: Secondary | ICD-10-CM

## 2018-10-08 DIAGNOSIS — E785 Hyperlipidemia, unspecified: Secondary | ICD-10-CM | POA: Diagnosis not present

## 2018-10-08 DIAGNOSIS — Z01812 Encounter for preprocedural laboratory examination: Secondary | ICD-10-CM | POA: Diagnosis not present

## 2018-10-08 MED ORDER — ISOSORBIDE MONONITRATE ER 120 MG PO TB24: 120.0000 mg | ORAL_TABLET | Freq: Every day | ORAL | 5 refills | Status: DC

## 2018-10-08 MED ORDER — NITROGLYCERIN 0.4 MG SL SUBL: 0.4000 mg | SUBLINGUAL_TABLET | SUBLINGUAL | 3 refills | Status: AC | PRN

## 2018-10-08 MED ORDER — PREDNISONE 50 MG PO TABS: ORAL_TABLET | ORAL | 0 refills | Status: AC

## 2018-10-08 NOTE — Patient Instructions (Signed)
Medication Instructions:  INCREASE YOUR ISOSORBIDE TO 120 MG DAILY   Use your NTG under your tongue for recurrent chest pain. May take one tablet every 5 minutes. If you are still having discomfort after 3 tablets in 15 minutes, call 911. SENT TO PHARMACY   If you need a refill on your cardiac medications before your next appointment, please call your pharmacy.   Lab work: BMET/CBC TODAY   GO FOR COVID SCREENING TODAY WHEN YOU LEAVE OFFICE THIS WILL BE DONE AT THE [facility]   If you have labs (blood work) drawn today and your tests are completely normal, you will receive your results only by: [name] MyChart Message (if you have MyChart) OR . A paper copy in the mail If you have any lab test that is abnormal or we need to change your treatment, we will call you to review the results.  Testing/Procedures: Your physician has requested that you have a cardiac catheterization. Cardiac catheterization is used to diagnose and/or treat various heart conditions. Doctors may recommend this procedure for a number of different reasons. The most common reason is to evaluate chest pain. Chest pain can be a symptom of coronary artery disease (CAD), and cardiac catheterization can show whether plaque is narrowing or blocking your heart's arteries. This procedure is also used to evaluate the valves, as well as measure the blood flow and oxygen levels in different parts of your heart. For further information please visit [email or web address]. Please follow instruction sheet, as given.  Follow-Up: TO BE DETERMINED AFTER YOUR CATH  Any Other Special Instructions Will Be Listed Below (If Applicable).           [place] [address] Dept: [phone] Loc: [place]  10/08/2018  You are scheduled for a Cardiac Catheterization on Thursday, August 27 with Dr.  Larae Grooms.  1. Please arrive at the [facility] (Main Entrance A) at [facility]: [address] [place], [place] [address] at 11:30 AM (This time is two hours before your procedure to ensure your preparation). Free valet parking service is available.   Special note: Every effort is made to have your procedure done on time. Please understand that emergencies sometimes delay scheduled procedures.  2. Diet: Do not eat solid foods after midnight.  The patient may have clear liquids until 5am upon the day of the procedure.  3. Labs: You will need to have blood drawn TODAY   4. Medication instructions in preparation for your procedure:   Contrast Allergy: Yes, Please take Prednisone 50mg  by mouth at: Thirteen hours prior to cath  Seven hours prior to cath   And prior to leaving home please take last dose of Prednisone 50mg  and Benadryl 50mg  by mouth.  On the morning of your procedure, take your Plavix/Clopidogrel and any morning medicines NOT listed above.  You may use sips of water.  5. Plan for one night stay--bring personal belongings. 6. Bring a current list of your medications and current insurance cards. 7. You MUST have a responsible person to drive you home. 8. Someone MUST be with you the first 24 hours after you arrive home or your discharge will be delayed. 9. Please wear clothes that are easy to get on and off and wear slip-on shoes.  Thank you for allowing [place] to care for you!   -- [facility]

## 2018-10-09 ENCOUNTER — Telehealth: Payer: Self-pay | Admitting: *Deleted

## 2018-10-09 ENCOUNTER — Other Ambulatory Visit (HOSPITAL_COMMUNITY)
Admission: RE | Admit: 2018-10-09 | Discharge: 2018-10-09 | Disposition: A | Discharge status: Home / Self Care | Payer: Medicare Other | Source: Ambulatory Visit | Attending: Interventional Cardiology | Admitting: Interventional Cardiology

## 2018-10-09 DIAGNOSIS — Z01812 Encounter for preprocedural laboratory examination: Secondary | ICD-10-CM | POA: Insufficient documentation

## 2018-10-09 DIAGNOSIS — Z20828 Contact with and (suspected) exposure to other viral communicable diseases: Secondary | ICD-10-CM | POA: Insufficient documentation

## 2018-10-09 LAB — CBC WITH DIFFERENTIAL/PLATELET
Basophils Absolute: 0 10*3/uL (ref 0.0–0.2)
Basos: 0 %
EOS (ABSOLUTE): 0 10*3/uL (ref 0.0–0.4)
Eos: 0 %
Hematocrit: 34 % (ref 34.0–46.6)
Hemoglobin: 10.4 g/dL — ABNORMAL LOW (ref 11.1–15.9)
Immature Grans (Abs): 0 10*3/uL (ref 0.0–0.1)
Immature Granulocytes: 0 %
Lymphocytes Absolute: 1.3 10*3/uL (ref 0.7–3.1)
Lymphs: 18 %
MCH: 24.6 pg — ABNORMAL LOW (ref 26.6–33.0)
MCHC: 30.6 g/dL — ABNORMAL LOW (ref 31.5–35.7)
MCV: 81 fL (ref 79–97)
Monocytes Absolute: 0.2 10*3/uL (ref 0.1–0.9)
Monocytes: 3 %
Neutrophils Absolute: 5.7 10*3/uL (ref 1.4–7.0)
Neutrophils: 79 %
Platelets: 331 10*3/uL (ref 150–450)
RBC: 4.22 x10E6/uL (ref 3.77–5.28)
RDW: 15.3 % (ref 11.7–15.4)
WBC: 7.3 10*3/uL (ref 3.4–10.8)

## 2018-10-09 LAB — BASIC METABOLIC PANEL
BUN/Creatinine Ratio: 11 — ABNORMAL LOW (ref 12–28)
BUN: 8 mg/dL (ref 8–27)
CO2: 22 mmol/L (ref 20–29)
Calcium: 9.4 mg/dL (ref 8.7–10.3)
Chloride: 99 mmol/L (ref 96–106)
Creatinine, Ser: 0.72 mg/dL (ref 0.57–1.00)
GFR calc Af Amer: 96 mL/min/{1.73_m2} (ref >59)
GFR calc non Af Amer: 83 mL/min/{1.73_m2} (ref >59)
Glucose: 149 mg/dL — ABNORMAL HIGH (ref 65–99)
Potassium: 5.3 mmol/L — ABNORMAL HIGH (ref 3.5–5.2)
Sodium: 135 mmol/L (ref 134–144)

## 2018-10-09 LAB — SARS CORONAVIRUS 2 (TAT 6-24 HRS): SARS Coronavirus 2: NEGATIVE

## 2018-10-09 NOTE — Telephone Encounter (Signed)
Pt contacted pre-catheterization scheduled at [facility] for: Thursday October 10, 2018 1:30 PM Verified arrival time and place: [facility] [place] [facility]) at: 11:30 AM   No solid food after midnight prior to cath, clear liquids until 5 AM day of procedure.  Contrast allergy: yes-13 hour Prednisone and Benadryl Prep reviewed with patient. Prednisone 50 mg 12:30 AM 10/10/18 Prednisone 50 mg 6:30 AM 10/10/18 Prednisone 50 mg and Benadryl 50 mg-just prior to leaving for hospital AM 10/10/18 Pt advised not to drive to hospital.   AM meds can be  taken pre-cath with sip of water including: ASA 81 mg Plavix 75 mg Prednisone 50 mg  Benadryl 50 mg   Confirmed patient has responsible person to drive home post procedure and observe 24 hours after arriving home: yes  Currently, due to Covid-19 pandemic, only one support person will be allowed with patient. Must be the same support person for that patient's entire stay, will be screened and required to wear a mask. They will be asked to wait in the waiting room for the duration of the patient's stay.  Patients are required to wear a mask when they enter the hospital.      COVID-19 Pre-Screening Questions:  . In the past 7 to 10 days have you had a cough,  shortness of breath, headache, congestion, fever (100 or greater) body aches, chills, sore throat, or sudden loss of taste or sense of smell? no . Have you been around anyone with known Covid 19? no . Have you been around anyone who is awaiting Covid 19 test results in the past 7 to 10 days? no . Have you been around anyone who has been exposed to Covid 19, or has mentioned symptoms of Covid 19 within the past 7 to 10 days? no   I reviewed procedure/mask/visitor, Covid-19 screening questions with patient, she verbalized understanding, thanked me for call.  Copied from [facility] results done 10/08/18: Notes recorded by Minus Breeding, MD on 10/09/2018 at 9:55 AM EDT    Potassium is mildly elevated and should be checked with iStat at the time of her cath.   Pt is aware K mildly elevated and will be checked at time of cath 10/10/18.    Staff message sent  to Anderson Malta, RN, [facility] Cath Lab.

## 2018-10-10 ENCOUNTER — Other Ambulatory Visit: Payer: Self-pay

## 2018-10-10 ENCOUNTER — Other Ambulatory Visit: Payer: Self-pay | Admitting: *Deleted

## 2018-10-10 ENCOUNTER — Inpatient Hospital Stay (HOSPITAL_COMMUNITY)
Admission: RE | Admit: 2018-10-10 | Discharge: 2018-10-20 | DRG: 234 | Disposition: A | Discharge status: Home Health | Payer: Medicare Other | Attending: Surgery | Admitting: Surgery

## 2018-10-10 ENCOUNTER — Encounter (HOSPITAL_COMMUNITY)
Admission: RE | Disposition: A | Discharge status: Home Health | Payer: Self-pay | Source: Home / Self Care | Attending: Surgery

## 2018-10-10 DIAGNOSIS — Z800 Family history of malignant neoplasm of digestive organs: Secondary | ICD-10-CM

## 2018-10-10 DIAGNOSIS — Z833 Family history of diabetes mellitus: Secondary | ICD-10-CM

## 2018-10-10 DIAGNOSIS — F411 Generalized anxiety disorder: Secondary | ICD-10-CM | POA: Diagnosis present

## 2018-10-10 DIAGNOSIS — E785 Hyperlipidemia, unspecified: Secondary | ICD-10-CM | POA: Diagnosis present

## 2018-10-10 DIAGNOSIS — Z96651 Presence of right artificial knee joint: Secondary | ICD-10-CM | POA: Diagnosis present

## 2018-10-10 DIAGNOSIS — G4733 Obstructive sleep apnea (adult) (pediatric): Secondary | ICD-10-CM | POA: Diagnosis present

## 2018-10-10 DIAGNOSIS — I255 Ischemic cardiomyopathy: Secondary | ICD-10-CM | POA: Diagnosis present

## 2018-10-10 DIAGNOSIS — Z01810 Encounter for preprocedural cardiovascular examination: Secondary | ICD-10-CM | POA: Diagnosis not present

## 2018-10-10 DIAGNOSIS — Z8249 Family history of ischemic heart disease and other diseases of the circulatory system: Secondary | ICD-10-CM | POA: Diagnosis not present

## 2018-10-10 DIAGNOSIS — Z20828 Contact with and (suspected) exposure to other viral communicable diseases: Secondary | ICD-10-CM | POA: Diagnosis present

## 2018-10-10 DIAGNOSIS — I2510 Atherosclerotic heart disease of native coronary artery without angina pectoris: Secondary | ICD-10-CM | POA: Diagnosis present

## 2018-10-10 DIAGNOSIS — E119 Type 2 diabetes mellitus without complications: Secondary | ICD-10-CM | POA: Diagnosis present

## 2018-10-10 DIAGNOSIS — I480 Paroxysmal atrial fibrillation: Secondary | ICD-10-CM | POA: Diagnosis present

## 2018-10-10 DIAGNOSIS — Z803 Family history of malignant neoplasm of breast: Secondary | ICD-10-CM | POA: Diagnosis not present

## 2018-10-10 DIAGNOSIS — I2583 Coronary atherosclerosis due to lipid rich plaque: Secondary | ICD-10-CM | POA: Diagnosis present

## 2018-10-10 DIAGNOSIS — I25110 Atherosclerotic heart disease of native coronary artery with unstable angina pectoris: Principal | ICD-10-CM | POA: Diagnosis present

## 2018-10-10 DIAGNOSIS — I252 Old myocardial infarction: Secondary | ICD-10-CM

## 2018-10-10 DIAGNOSIS — Z951 Presence of aortocoronary bypass graft: Secondary | ICD-10-CM

## 2018-10-10 DIAGNOSIS — I10 Essential (primary) hypertension: Secondary | ICD-10-CM | POA: Diagnosis present

## 2018-10-10 DIAGNOSIS — Z7982 Long term (current) use of aspirin: Secondary | ICD-10-CM | POA: Diagnosis not present

## 2018-10-10 DIAGNOSIS — Z86010 Personal history of colonic polyps: Secondary | ICD-10-CM | POA: Diagnosis not present

## 2018-10-10 DIAGNOSIS — K219 Gastro-esophageal reflux disease without esophagitis: Secondary | ICD-10-CM | POA: Diagnosis present

## 2018-10-10 DIAGNOSIS — Z7902 Long term (current) use of antithrombotics/antiplatelets: Secondary | ICD-10-CM

## 2018-10-10 DIAGNOSIS — Z90710 Acquired absence of both cervix and uterus: Secondary | ICD-10-CM

## 2018-10-10 DIAGNOSIS — Z79899 Other long term (current) drug therapy: Secondary | ICD-10-CM

## 2018-10-10 DIAGNOSIS — Z947 Corneal transplant status: Secondary | ICD-10-CM | POA: Diagnosis not present

## 2018-10-10 DIAGNOSIS — Z8042 Family history of malignant neoplasm of prostate: Secondary | ICD-10-CM

## 2018-10-10 DIAGNOSIS — Z789 Other specified health status: Secondary | ICD-10-CM | POA: Diagnosis not present

## 2018-10-10 DIAGNOSIS — I200 Unstable angina: Secondary | ICD-10-CM | POA: Diagnosis not present

## 2018-10-10 DIAGNOSIS — F419 Anxiety disorder, unspecified: Secondary | ICD-10-CM | POA: Diagnosis not present

## 2018-10-10 DIAGNOSIS — I081 Rheumatic disorders of both mitral and tricuspid valves: Secondary | ICD-10-CM | POA: Diagnosis not present

## 2018-10-10 DIAGNOSIS — Z823 Family history of stroke: Secondary | ICD-10-CM | POA: Diagnosis not present

## 2018-10-10 DIAGNOSIS — Z7952 Long term (current) use of systemic steroids: Secondary | ICD-10-CM

## 2018-10-10 DIAGNOSIS — R0989 Other specified symptoms and signs involving the circulatory and respiratory systems: Secondary | ICD-10-CM | POA: Diagnosis not present

## 2018-10-10 DIAGNOSIS — J9811 Atelectasis: Secondary | ICD-10-CM | POA: Diagnosis present

## 2018-10-10 HISTORY — PX: LEFT HEART CATH AND CORONARY ANGIOGRAPHY: CATH118249

## 2018-10-10 LAB — MRSA PCR SCREENING: MRSA by PCR: NEGATIVE

## 2018-10-10 LAB — POTASSIUM: Potassium: 4 mmol/L (ref 3.5–5.1)

## 2018-10-10 SURGERY — LEFT HEART CATH AND CORONARY ANGIOGRAPHY
Anesthesia: LOCAL

## 2018-10-10 MED ORDER — LOSARTAN POTASSIUM 50 MG PO TABS
100.0000 mg | ORAL_TABLET | Freq: Every day | ORAL | Status: DC
  Administered 2018-10-11 – 2018-10-13 (×3): 100 mg via ORAL
  Filled 2018-10-10 (×3): qty 2

## 2018-10-10 MED ORDER — SODIUM CHLORIDE 0.9% FLUSH
3.0000 mL | INTRAVENOUS | Status: DC | PRN
  Administered 2018-10-12: 3 mL via INTRAVENOUS
  Filled 2018-10-10: qty 3

## 2018-10-10 MED ORDER — LIDOCAINE HCL (PF) 1 % IJ SOLN
INTRAMUSCULAR | Status: AC
  Filled 2018-10-10: qty 30

## 2018-10-10 MED ORDER — CLOPIDOGREL BISULFATE 75 MG PO TABS: 75.0000 mg | ORAL_TABLET | ORAL | Status: DC

## 2018-10-10 MED ORDER — HYDRALAZINE HCL 20 MG/ML IJ SOLN: 10.0000 mg | INTRAMUSCULAR | Status: AC | PRN

## 2018-10-10 MED ORDER — IOHEXOL 350 MG/ML SOLN
INTRAVENOUS | Status: DC | PRN
  Administered 2018-10-10: 16:00:00 40 mL via INTRA_ARTERIAL

## 2018-10-10 MED ORDER — HEPARIN (PORCINE) IN NACL 1000-0.9 UT/500ML-% IV SOLN
INTRAVENOUS | Status: DC | PRN
  Administered 2018-10-10 (×2): 500 mL

## 2018-10-10 MED ORDER — PHENAZOPYRIDINE HCL 200 MG PO TABS: 200.0000 mg | ORAL_TABLET | Freq: Three times a day (TID) | ORAL | Status: DC

## 2018-10-10 MED ORDER — HEPARIN SODIUM (PORCINE) 1000 UNIT/ML IJ SOLN
INTRAMUSCULAR | Status: AC
  Filled 2018-10-10: qty 1

## 2018-10-10 MED ORDER — SODIUM CHLORIDE 0.9 % WEIGHT BASED INFUSION
3.0000 mL/kg/h | INTRAVENOUS | Status: DC
  Administered 2018-10-10: 15:00:00 3 mL/kg/h via INTRAVENOUS

## 2018-10-10 MED ORDER — SODIUM CHLORIDE 0.9 % IV SOLN
250.0000 mL | INTRAVENOUS | Status: DC | PRN
  Administered 2018-10-11: 250 mL via INTRAVENOUS

## 2018-10-10 MED ORDER — SODIUM CHLORIDE 0.9% FLUSH
3.0000 mL | Freq: Two times a day (BID) | INTRAVENOUS | Status: DC
  Administered 2018-10-11 – 2018-10-13 (×5): 3 mL via INTRAVENOUS

## 2018-10-10 MED ORDER — HEPARIN (PORCINE) IN NACL 1000-0.9 UT/500ML-% IV SOLN
INTRAVENOUS | Status: AC
  Filled 2018-10-10: qty 1000

## 2018-10-10 MED ORDER — ASPIRIN 81 MG PO CHEW: 81.0000 mg | CHEWABLE_TABLET | Freq: Every day | ORAL | Status: DC

## 2018-10-10 MED ORDER — HYDRALAZINE HCL 20 MG/ML IJ SOLN
INTRAMUSCULAR | Status: DC | PRN
  Administered 2018-10-10: 10 mg via INTRAVENOUS

## 2018-10-10 MED ORDER — LABETALOL HCL 5 MG/ML IV SOLN
INTRAVENOUS | Status: AC
  Filled 2018-10-10: qty 4

## 2018-10-10 MED ORDER — SODIUM CHLORIDE 0.9 % IV SOLN: 250.0000 mL | INTRAVENOUS | Status: DC | PRN

## 2018-10-10 MED ORDER — ESTRADIOL 1 MG PO TABS
1.0000 mg | ORAL_TABLET | Freq: Every day | ORAL | Status: DC
  Administered 2018-10-10 – 2018-10-13 (×4): 1 mg via ORAL
  Filled 2018-10-10 (×4): qty 1

## 2018-10-10 MED ORDER — SODIUM CHLORIDE 0.9 % IV SOLN: INTRAVENOUS | Status: AC

## 2018-10-10 MED ORDER — FENTANYL CITRATE (PF) 100 MCG/2ML IJ SOLN
INTRAMUSCULAR | Status: DC | PRN
  Administered 2018-10-10: 25 ug via INTRAVENOUS

## 2018-10-10 MED ORDER — EVOLOCUMAB 140 MG/ML ~~LOC~~ SOAJ: 140.0000 mg | SUBCUTANEOUS | Status: DC

## 2018-10-10 MED ORDER — ASPIRIN 81 MG PO CHEW: 81.0000 mg | CHEWABLE_TABLET | ORAL | Status: DC

## 2018-10-10 MED ORDER — LIDOCAINE HCL (PF) 1 % IJ SOLN
INTRAMUSCULAR | Status: DC | PRN
  Administered 2018-10-10: 40 mL

## 2018-10-10 MED ORDER — ASPIRIN EC 81 MG PO TBEC
81.0000 mg | DELAYED_RELEASE_TABLET | Freq: Every day | ORAL | Status: DC
  Administered 2018-10-11 – 2018-10-13 (×3): 81 mg via ORAL
  Filled 2018-10-10 (×3): qty 1

## 2018-10-10 MED ORDER — LABETALOL HCL 5 MG/ML IV SOLN
10.0000 mg | INTRAVENOUS | Status: AC | PRN
  Administered 2018-10-10: 17:00:00 10 mg via INTRAVENOUS

## 2018-10-10 MED ORDER — FENTANYL CITRATE (PF) 100 MCG/2ML IJ SOLN
INTRAMUSCULAR | Status: AC
  Filled 2018-10-10: qty 2

## 2018-10-10 MED ORDER — PREDNISOLONE ACETATE 1 % OP SUSP
1.0000 [drp] | Freq: Every day | OPHTHALMIC | Status: DC
  Administered 2018-10-11 – 2018-10-12 (×2): 1 [drp] via OPHTHALMIC
  Filled 2018-10-10: qty 5

## 2018-10-10 MED ORDER — MIDAZOLAM HCL 2 MG/2ML IJ SOLN
INTRAMUSCULAR | Status: AC
  Filled 2018-10-10: qty 2

## 2018-10-10 MED ORDER — MIDAZOLAM HCL 2 MG/2ML IJ SOLN
INTRAMUSCULAR | Status: DC | PRN
  Administered 2018-10-10: 1 mg via INTRAVENOUS

## 2018-10-10 MED ORDER — SODIUM CHLORIDE 0.9% FLUSH
3.0000 mL | Freq: Two times a day (BID) | INTRAVENOUS | Status: DC
  Administered 2018-10-12: 21:00:00 3 mL via INTRAVENOUS

## 2018-10-10 MED ORDER — ONDANSETRON HCL 4 MG/2ML IJ SOLN
4.0000 mg | Freq: Four times a day (QID) | INTRAMUSCULAR | Status: DC | PRN
  Administered 2018-10-12 – 2018-10-13 (×3): 4 mg via INTRAVENOUS
  Filled 2018-10-10 (×3): qty 2

## 2018-10-10 MED ORDER — VERAPAMIL HCL 2.5 MG/ML IV SOLN
INTRAVENOUS | Status: AC
  Filled 2018-10-10: qty 2

## 2018-10-10 MED ORDER — SODIUM CHLORIDE 0.9% FLUSH: 3.0000 mL | INTRAVENOUS | Status: DC | PRN

## 2018-10-10 MED ORDER — CHLORHEXIDINE GLUCONATE CLOTH 2 % EX PADS
6.0000 | MEDICATED_PAD | Freq: Every day | CUTANEOUS | Status: DC
  Administered 2018-10-11 – 2018-10-13 (×3): 6 via TOPICAL

## 2018-10-10 MED ORDER — MORPHINE SULFATE (PF) 2 MG/ML IV SOLN
2.0000 mg | INTRAVENOUS | Status: DC | PRN
  Administered 2018-10-10 – 2018-10-11 (×2): 2 mg via INTRAVENOUS
  Filled 2018-10-10 (×2): qty 1

## 2018-10-10 MED ORDER — HEPARIN (PORCINE) 25000 UT/250ML-% IV SOLN
900.0000 [IU]/h | INTRAVENOUS | Status: DC
  Filled 2018-10-10: qty 250

## 2018-10-10 MED ORDER — CARVEDILOL 6.25 MG PO TABS
6.2500 mg | ORAL_TABLET | Freq: Two times a day (BID) | ORAL | Status: DC
  Administered 2018-10-10 – 2018-10-13 (×7): 6.25 mg via ORAL
  Filled 2018-10-10 (×7): qty 1

## 2018-10-10 MED ORDER — HEPARIN (PORCINE) 25000 UT/250ML-% IV SOLN
950.0000 [IU]/h | INTRAVENOUS | Status: DC
  Administered 2018-10-10: 900 [IU]/h via INTRAVENOUS
  Administered 2018-10-11 – 2018-10-13 (×2): 950 [IU]/h via INTRAVENOUS
  Filled 2018-10-10 (×3): qty 250

## 2018-10-10 MED ORDER — ACETAMINOPHEN 325 MG PO TABS
650.0000 mg | ORAL_TABLET | ORAL | Status: DC | PRN
  Administered 2018-10-10 – 2018-10-13 (×3): 650 mg via ORAL
  Filled 2018-10-10 (×3): qty 2

## 2018-10-10 MED ORDER — SODIUM CHLORIDE 0.9 % WEIGHT BASED INFUSION: 1.0000 mL/kg/h | INTRAVENOUS | Status: DC

## 2018-10-10 MED ORDER — NITROGLYCERIN IN D5W 200-5 MCG/ML-% IV SOLN
INTRAVENOUS | Status: AC | PRN
  Administered 2018-10-10: 10 ug/min via INTRAVENOUS

## 2018-10-10 SURGICAL SUPPLY — 9 items
CATH INFINITI 5FR MULTPACK ANG (CATHETERS) ×1 IMPLANT
GLIDESHEATH SLEND A-KIT 6F 22G (SHEATH) IMPLANT
KIT HEART LEFT (KITS) ×2 IMPLANT
PACK CARDIAC CATHETERIZATION (CUSTOM PROCEDURE TRAY) ×2 IMPLANT
SHEATH PINNACLE 5F 10CM (SHEATH) ×1 IMPLANT
SYR MEDRAD MARK 7 150ML (SYRINGE) ×2 IMPLANT
TRANSDUCER W/STOPCOCK (MISCELLANEOUS) ×2 IMPLANT
TUBING CIL FLEX 10 FLL-RA (TUBING) ×2 IMPLANT
WIRE EMERALD 3MM-J .035X150CM (WIRE) ×1 IMPLANT

## 2018-10-10 NOTE — Interval H&P Note (Signed)
Cath Lab Visit (complete for each Cath Lab visit)  Clinical Evaluation Leading to the Procedure:   ACS: No.  Non-ACS:    Anginal Classification: CCS III  Anti-ischemic medical therapy: Maximal Therapy (2 or more classes of medications)  Non-Invasive Test Results: No non-invasive testing performed  Prior CABG: No previous CABG      History and Physical Interval Note:  10/10/2018 3:10 PM  Connie Owens  has presented today for surgery, with the diagnosis of chest pain.  The various methods of treatment have been discussed with the patient and family. After consideration of risks, benefits and other options for treatment, the patient has consented to  Procedure(s): LEFT HEART CATH AND CORONARY ANGIOGRAPHY (N/A) as a surgical intervention.  The patient's history has been reviewed, patient examined, no change in status, stable for surgery.  I have reviewed the patient's chart and labs.  Questions were answered to the patient's satisfaction.     Quay Burow

## 2018-10-10 NOTE — Consult Note (Signed)
[address]       [place] [address]             [phone]      Cardiothoracic Surgery Consultation  Reason for Consult: High-grade left main and severe multivessel coronary disease Referring Physician: Dr. Donnella Bi  BRAXLEY FERRON is an 73 y.o. female.  HPI:   The patient is a 73 year old woman with a history of hypertension, hyperlipidemia, OSA on CPAP, and coronary artery disease status post non-ST segment elevation MI in 09/2011.  Cardiac catheterization at that time showed 50% ostial left main stenosis and moderate disease in the LAD and RCA territories.  Her coronary disease is not felt to be amenable to PCI and she was treated medically.  She had an echocardiogram in January 2019 which showed ejection fraction of 55 to 60% with no regional wall motion abnormalities.  There is no significant valvular disease.  She reports that since January she has had exertional substernal chest discomfort as well as aching up into her shoulders, neck, and down her arms.  She was seen by cardiology in February and started on Imdur which seemed to help but over the past couple months she has noted progression of her symptoms that have been occurring with less and less exertion.  She said that last week she was visiting her sister and was having chest discomfort up into her neck and shoulders with any activity.  She was walking upstairs and getting markedly short of breath.  She said she knew something was definitely wrong.  She has also had marked tiredness and exertional fatigue.  Past Medical History:  Diagnosis Date  . Anemia   . Anxiety state    NOS  . Arthritis    osteo...right knee  . CAD (coronary artery disease)    NSTEMI 8/13 => LHC 09/27/11: oLM 50%, dLM 30%, oLAD 60-70%, pLAD 50%, mLAD 70%, pD1 30%, pD2 40%, oRCA 25%, mRCA 40%, EF 50% with ant HK => LAD not amenable to PCI => med Rx  unless recurrent angina = > consider CABG  . Carpal tunnel syndrome   .  Cataract   . Complication of anesthesia   . Corneal disorder   . Diverticular disease   . Dysmetabolic syndrome X   . GERD (gastroesophageal reflux disease)   . 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   [name] Headache, paroxysmal hemicrania, chronic 07/23/2012    Referral to BOTOX evaluation with dr Rexene Alberts or Krista Blue .  [name] Heart attack ([place]) 09/2011  . Hernia    hiatal  . HLD (hyperlipidemia)   . Hx of adenomatous colonic polyps   . Hypertension    essen. NOS  . Ischemic cardiomyopathy    a.  Echo 09/29/11: EF 30-35% with inferior, posterior, lateral AK, anterior severe HK, mild LVH, mild MR, PASP 40;  => b. follow up echo 10/13:  EF 55-60%, Gr 1 diast dysfn, mild MR  . Migraine headache without aura 07/23/2012    Left sided  Lamonte Sakai thought to be migrainous  , hypersensitivity to touch , photophobia ,  no nausea.   [name] PONV (postoperative nausea and vomiting)   . Shoulder fracture   . Sleep apnea    CPAP    Past Surgical History:  Procedure Laterality Date  . ABDOMINAL HYSTERECTOMY    . ABDOMINOPLASTY  10/23/2016  . ankle cystectomy Right   .  APPENDECTOMY    . BRACHIOPLASTY Bilateral 05/02/2016  . BREAST EXCISIONAL BIOPSY Right 02/15/1994  . BREAST LUMPECTOMY Right 1996  . BUNIONECTOMY Right   . CARDIAC CATHETERIZATION  09/28/2011   Dr Acie Fredrickson(  to see Dr Ron Parker)  . CHOLECYSTECTOMY    . COLONOSCOPY W/ POLYPECTOMY    . 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 CORNEAL TRANSPLANT Left    x1  . HEMORRHOID SURGERY  12/01/2010, 2014   [place] hemorrhoid ligation/pexy  . LEFT HEART CATHETERIZATION WITH CORONARY ANGIOGRAM N/A 09/28/2011   Procedure: LEFT HEART CATHETERIZATION WITH CORONARY ANGIOGRAM;  Surgeon: Minus Breeding, MD;  Location: [facility] CATH LAB;  Service: Cardiovascular;  Laterality: N/A;  . lumbar spur removed    . rectocele surgery    . SHOULDER OPEN ROTATOR CUFF REPAIR Right  10/31/2017   Procedure: Right rotator cuff repair with graft and anchor, acromioplasty;  Surgeon: Latanya Maudlin, MD;  Location: [facility] ORS;  Service: Orthopedics;  Laterality: Right;  . TOTAL KNEE ARTHROPLASTY Right     Family History  Problem Relation Age of Onset  . Breast cancer Mother   . Heart disease Father   . Stroke Father   . Dementia Father   . Prostate cancer Father   . Diabetes Sister        3 sisters   . Heart disease Sister        1 sister CABG  . Breast cancer Sister        PTE pre & post Dx of ca  . Diabetes Paternal Grandfather   . Colon cancer Maternal Uncle     Social History:  reports that she has never smoked. She has never used smokeless tobacco. She reports current alcohol use. She reports that she does not use drugs.  Allergies:  Allergies  Allergen Reactions  . Iohexol Rash    rash  . Iodinated Diagnostic Agents Other (See Comments) and Rash    Other Reaction: Other reaction  . Statins Other (See Comments)    Cause muscle aches/pains  . Imdur [Isosorbide Nitrate]     Severe headaches  . Latex Rash  . Tape Rash and Other (See Comments)    Plastic tap-blisters  . Verapamil Other (See Comments)    dizziness    Medications:  I have reviewed the patient's current medications. Prior to Admission:  Medications Prior to Admission  Medication Sig Dispense Refill Last Dose  . acetaminophen (TYLENOL) 500 MG tablet Take 1,000 mg by mouth every 6 (six) hours as needed (knee pain.).   10/09/2018 at Unknown time  . aspirin EC 81 MG tablet Take 81 mg by mouth daily.   10/10/2018 at 0800  . azelastine (ASTELIN) 0.1 % nasal spray Place 2 sprays into both nostrils 2 (two) times daily as needed for rhinitis or allergies. 30 mL 1   . brimonidine-timolol (COMBIGAN) 0.2-0.5 % ophthalmic solution Place 1 drop into the left eye 2 (two) times daily.      . carvedilol (COREG) 6.25 MG tablet Take 1 tablet (6.25 mg total) by mouth 2 (two) times daily. 180 tablet 2 10/10/2018  at 0800  . clobetasol ointment (TEMOVATE) [id number] % Apply 1 application topically 2 (two) times daily. (Patient taking differently: Apply 1 application topically 2 (two) times daily as needed (psoriasis). ) 90 g 1   . clopidogrel (PLAVIX) 75 MG tablet Take 1 tablet (75 mg total) by mouth daily. 90 tablet 2 10/10/2018 at 0800  .  estradiol (ESTRACE) 1 MG tablet Take 1 mg by mouth at bedtime.    10/09/2018 at Unknown time  . Evolocumab (REPATHA SURECLICK) [id number] MG/ML SOAJ Inject 140 mg into the skin every 14 (fourteen) days. (Patient taking differently: Inject 140 mg into the skin every 14 (fourteen) days. Tuesdays) 6 pen 4 10/01/2018 at Unknown time  . fluticasone (FLONASE) 50 MCG/ACT nasal spray Place 2 sprays into both nostrils daily. (Patient taking differently: Place 2 sprays into both nostrils daily as needed for allergies. ) 16 g 6   . gabapentin (NEURONTIN) 100 MG capsule Take 4 capsules (400 mg total) by mouth 3 (three) times daily. 4 po tid for pain (Patient taking differently: Take 400 mg by mouth at bedtime. ) 1080 capsule 1 10/09/2018 at Unknown time  . isosorbide mononitrate (IMDUR) 120 MG 24 hr tablet Take 1 tablet (120 mg total) by mouth daily. 30 tablet 5 10/09/2018 at Unknown time  . latanoprost (XALATAN) 0.005 % ophthalmic solution Place 1 drop into both eyes at bedtime.    10/10/2018 at Unknown time  . losartan (COZAAR) 100 MG tablet Take 1 tablet (100 mg total) by mouth daily. (Patient taking differently: Take 100 mg by mouth every evening. ) 90 tablet 3 10/09/2018 at Unknown time  . Multiple Vitamin (MULTIVITAMIN WITH MINERALS) TABS tablet Take 1 tablet by mouth daily after breakfast.   Past Week at Unknown time  . nitroGLYCERIN (NITROSTAT) 0.4 MG SL tablet Place 1 tablet (0.4 mg total) under the tongue every 5 (five) minutes x 3 doses as needed for chest pain. 25 tablet 3 10/08/2018 at Unknown time  . nortriptyline (PAMELOR) 10 MG capsule Use between 2 and 5 capsules at night po , prn-depending on  headache (Patient taking differently: Take 20-50 mg by mouth at bedtime. Use between 2 and 5 capsules at night po , prn-depending on headache) 450 capsule 3 10/09/2018 at Unknown time  . predniSONE (DELTASONE) 50 MG tablet TAKE 1 TAB 13 HOURS PRIOR, 7 HOURS PRIOR, AND PRIOR TO LEAVING FOR PROCEDURE 3 tablet 0 10/10/2018 at Unknown time  . tiZANidine (ZANAFLEX) 4 MG tablet Take 4 mg by mouth daily at 6 PM.    Past Week at Unknown time  . zolpidem (AMBIEN) 5 MG tablet Take 1 tablet (5 mg total) by mouth at bedtime as needed for sleep. (Patient taking differently: Take 5 mg by mouth at bedtime. ) 30 tablet 5 10/09/2018 at Unknown time  . levocetirizine (XYZAL) 5 MG tablet Take 1 tablet (5 mg total) by mouth every evening. (Patient not taking: Reported on 10/09/2018) 30 tablet 5 Not Taking at Unknown time  . phenazopyridine (PYRIDIUM) 200 MG tablet Take 1 tablet (200 mg total) by mouth 3 (three) times daily as needed for pain. (Patient not taking: Reported on 10/09/2018) 6 tablet 0 Not Taking at Unknown time  . prednisoLONE acetate (PRED FORTE) 1 % ophthalmic suspension Place 1 drop into both eyes See admin instructions. 1 drop into the right eye twice daily 1 drop into the left eye once daily at night.      Scheduled: . [START ON 10/11/2018] aspirin EC  81 mg Oral Daily  . carvedilol  6.25 mg Oral BID  . Chlorhexidine Gluconate Cloth  6 each Topical Daily  . estradiol  1 mg Oral QHS  . [START ON 10/11/2018] losartan  100 mg Oral Daily  . [START ON 10/11/2018] prednisoLONE acetate  1 drop Both Eyes QHS  . sodium chloride flush  3 mL  Intravenous Q12H  . sodium chloride flush  3 mL Intravenous Q12H   Continuous: . sodium chloride 75 mL/hr at 10/10/18 1619  . sodium chloride    . heparin     [id number] chloride, acetaminophen, hydrALAZINE, labetalol, morphine injection, ondansetron (ZOFRAN) IV, sodium chloride flush Anti-infectives (From admission, onward)   None      Results for orders placed or  performed during the hospital encounter of 10/10/18 (from the past 48 hour(s))  Potassium     Status: None   Collection Time: 10/10/18  2:00 PM  Result Value Ref Range   Potassium 4.0 3.5 - 5.1 mmol/L    Comment: Performed at [facility], [place] [address], [place], [place] [address]    No results found.  Review of Systems  Constitutional: Positive for malaise/fatigue.  HENT: Negative.   Eyes:       Has had multiple corneal transplants  Respiratory: Positive for shortness of breath.   Cardiovascular: Positive for chest pain and leg swelling. Negative for orthopnea and PND.  Gastrointestinal: Negative.   Genitourinary: Negative.   Musculoskeletal: Positive for neck pain.  Skin: Negative.   Neurological: Negative.   Endo/Heme/Allergies: Bruises/bleeds easily.  Psychiatric/Behavioral: Negative.    Blood pressure (!) 150/70, pulse 84, temperature 97.9 F (36.6 C), temperature source Skin, resp. rate 14, height 5\' 4"  (1.626 m), weight 93.4 kg, SpO2 96 %. Physical Exam  Constitutional: She is oriented to person, place, and time.  Obese woman in no distress  HENT:  Head: Normocephalic and atraumatic.  Eyes: Pupils are equal, round, and reactive to light. Conjunctivae and EOM are normal.  Neck: No JVD present.  Cardiovascular: Normal rate, regular rhythm, normal heart sounds and intact distal pulses.  No murmur heard. Respiratory: Breath sounds normal. No respiratory distress.  GI: Soft. Bowel sounds are normal. There is no abdominal tenderness.  Musculoskeletal:        General: No edema.  Lymphadenopathy:    She has no cervical adenopathy.  Neurological: She is alert and oriented to person, place, and time.  Skin: Skin is warm and dry.  Psychiatric: She has a normal mood and affect. Her behavior is normal.   Physicians  Panel Physicians Referring Physician Case Authorizing Physician  Lorretta Harp, MD (Primary)    Procedures  LEFT HEART CATH AND CORONARY  ANGIOGRAPHY  Conclusion    Ost LM lesion is 90% stenosed.  Ost RCA lesion is 90% stenosed.  Ost LAD to Prox LAD lesion is 80% stenosed.  Prox LAD to Mid LAD lesion is 80% stenosed.  Prox RCA to Mid RCA lesion is 50% stenosed.  The left ventricular systolic function is normal.  LV end diastolic pressure is normal.  The left ventricular ejection fraction is 55-65% by visual estimate.   MORELIA CLUCK is a 73 y.o. female    [id number] LOCATION:  FACILITY: [facility]  PHYSICIAN: Quay Burow, M.D. 1945-07-21   DATE OF PROCEDURE:  10/10/2018  DATE OF DISCHARGE:     CARDIAC CATHETERIZATION     History obtained from chart review.  Ms. Balsam is a 73 year old mildly overweight Caucasian female patient of Dr. Rosezella [place] who had a cardiac catheterization performed 09/28/2011 via the right femoral approach revealing 50% ostial left main, moderate segmental proximal and mid LAD disease and ostial RCA disease which he described a spasm.  She also has hyperlipidemia and hypertension.  She has had progressive accelerated angina on antianginal medications and was treated for outpatient diagnostic coronary angiography to  define her anatomy.   IMPRESSION: Ms. Hristov has left main three-vessel disease.  She has 90% ostial left main with severe damping and high-grade calcified proximal and mid LAD disease progressed since her cath in 2013.  She also has 90 to 95% calcified ostial RCA stenosis with normal LV function.  She will need CABG.  She is on Plavix which was held.  She will require at least a 5-day washout suggesting she would be ready for CABG on Monday.  She was hypertensive in the lab and therefore I did not close her groin.  She received hydralazine and was placed on IV nitro drip.  Given the accelerated nature of her symptoms I am going to begin her on IV heparin 6 hours after sheath removal without a bolus.  T CTS has been notified.  She will remain in unit 2H given the  critical nature of her anatomy for close observation until her bypass surgery.  The sheath was removed and pressure held in the groin to achieve hemostasis.  The patient left lab in stable condition.  Quay Burow. MD, [facility] 10/10/2018 4:11 PM      Recommendations  Antiplatelet/Anticoag Recommend Aspirin 81mg  daily for moderate CAD.  Indications  Unstable angina (HCC) [I20.0 (ICD-10-CM)]  Coronary artery disease due to lipid rich plaque [I25.10, I25.83 (ICD-10-CM)]  Procedural Details  Technical Details PROCEDURE DESCRIPTION:   The patient was brought to the second floor [facility] cath lab in the postabsorptive state.  She was premedicated with IV Versed and fentanyl..  She also took oral prednisone and Benadryl for contrast allergy prophylaxis at home.  Her right groinwas prepped and shaved in usual sterile fashion. Xylocaine 1% was used for local anesthesia. A 5 French sheath was inserted into the right common femoral artery using standard Seldinger technique.  5 French right left Judkins diagnostic catheters on with a plan French pigtail catheter were used for selective coronary angiography, subselective left internal mammary artery angiography and left ventriculography.  On the pigtail was used for the entirety of the case.  Retrograde aorta, left ventricular and pullback pressures were recorded. Estimated blood loss <50 mL.   During this procedure medications were administered to achieve and maintain moderate conscious sedation while the patient's heart rate, blood pressure, and oxygen saturation were continuously monitored and I was present face-to-face 100% of this time.  Medications (Filter: Administrations occurring from 10/10/18 1500 to 10/10/18 1607) (important)  Continuous medications are totaled by the amount administered until 10/10/18 1607.  Medication Rate/Dose/Volume Action  Date Time   Heparin (Porcine) in NaCl 1000-0.9 UT/500ML-% SOLN (mL) 500 mL Given  10/10/18 1516   Total dose as of 10/10/18 1607 500 mL Given 1516   1,000 mL        fentaNYL (SUBLIMAZE) injection (mcg) 25 mcg Given 10/10/18 1518   Total dose as of 10/10/18 1607        25 mcg        midazolam (VERSED) injection (mg) 1 mg Given 10/10/18 1518   Total dose as of 10/10/18 1607        1 mg        lidocaine (PF) (XYLOCAINE) 1 % injection (mL) 40 mL Given 10/10/18 1533   Total dose as of 10/10/18 1607        40 mL        hydrALAZINE (APRESOLINE) injection (mg) 10 mg Given 10/10/18 1546   Total dose as of 10/10/18 1607  10 mg        iohexol (OMNIPAQUE) 350 MG/ML injection (mL) 40 mL Given 10/10/18 1551   Total dose as of 10/10/18 1607        40 mL        nitroGLYCERIN 50 mg in dextrose 5 % 250 mL (0.2 mg/mL) infusion (mcg/min) 10 mcg/min - 3 mL/hr New Bag/Given 10/10/18 1552   Dosing weight:  93.4 kg        Total dose as of 10/10/18 1607        Cannot be calculated        Sedation Time  Sedation Time Physician-1: 27 minutes 55 seconds  Coronary Findings  Diagnostic Dominance: Right Left Main  Ost LM lesion 90% stenosed  Ost LM lesion is 90% stenosed.  Left Anterior Descending  Ost LAD to Prox LAD lesion 80% stenosed  Ost LAD to Prox LAD lesion is 80% stenosed.  Prox LAD to Mid LAD lesion 80% stenosed  Prox LAD to Mid LAD lesion is 80% stenosed.  Right Coronary Artery  Ost RCA lesion 90% stenosed  Ost RCA lesion is 90% stenosed.  Prox RCA to Mid RCA lesion 50% stenosed  Prox RCA to Mid RCA lesion is 50% stenosed.  Intervention  No interventions have been documented. Wall Motion  Resting       All segments of the heart are normal.          Left Heart  Left Ventricle The left ventricular size is normal. The left ventricular systolic function is normal. LV end diastolic pressure is normal. The left ventricular ejection fraction is 55-65% by visual estimate. No regional wall motion abnormalities.  Coronary Diagrams  Diagnostic Dominance: Right   Intervention  Implants   No implant documentation for this case.  Syngo Images  Show images for CARDIAC CATHETERIZATION  Images on Long Term Storage  Show images for Jacquline, Shines to Procedure Log  Procedure Log    Hemo Data   Most Recent Value  AO Systolic Pressure 0000000 mmHg  AO Diastolic Pressure 94 mmHg  AO Mean [id number] mmHg  LV Systolic Pressure XX123456 mmHg  LV Diastolic Pressure 11 mmHg  LV EDP 24 mmHg  AOp Systolic Pressure 0000000 mmHg  AOp Diastolic Pressure 92 mmHg  AOp Mean Pressure Q000111Q mmHg  LVp Systolic Pressure 0000000 mmHg  LVp Diastolic Pressure 5 mmHg  LVp EDP Pressure 17 mmHg    Assessment/Plan:  This 73 year old woman has high-grade left main and severe multivessel coronary disease with unstable anginal symptoms.  I agree that coronary bypass graft surgery is indicated to prevent further ischemia and infarction and improve her symptoms.  She has been on Plavix and her last dose was this morning.  Ideally we would like to wait about 5 days before doing surgery to allow Plavix washout but I think if we can wait until Monday morning that would be adequate.  We will check a P2 Y 12 level.  If she has chest pain that is refractory to intravenous nitroglycerin and heparin then she will need to have surgery sooner.I discussed the operative procedure with the patient and her daughter including alternatives, benefits and risks; including but not limited to bleeding, blood transfusion, infection, stroke, myocardial infarction, graft failure, heart block requiring a permanent pacemaker, organ dysfunction, and death.  Ernie Avena understands and agrees to proceed.  We will schedule surgery for Monday morning assuming that she remains stable on intravenous heparin and nitroglycerin.  I spent 40 minutes performing this consultation and > 50% of this time was spent face to face counseling and coordinating the care of this patient's high-grade left main and severe multivessel  coronary disease.  Fernande Boyden  10/10/2018, 6:20 PM

## 2018-10-10 NOTE — Progress Notes (Signed)
    5 fr sheath was removed from the R FA, and manual pressure was held for 15 min. Sterile gauze was applied at the site. R groin area is soft and non tender.   R DP was palpable before and after the sheath pull.   HR 80 SR  BP 153/68  sPO2 945 on 2 L/ min [place]   Bed rest started at 1700 X 4 hr. Patient received verbal instructions about this time.

## 2018-10-10 NOTE — Progress Notes (Addendum)
ANTICOAGULATION CONSULT NOTE - Initial Consult  Pharmacy Consult for heparin Indication: chest pain/ACS  Allergies  Allergen Reactions  . Iohexol Rash    rash  . Iodinated Diagnostic Agents Other (See Comments) and Rash    Other Reaction: Other reaction  . Statins Other (See Comments)    Cause muscle aches/pains  . Imdur [Isosorbide Nitrate]     Severe headaches  . Latex Rash  . Tape Rash and Other (See Comments)    Plastic tap-blisters  . Verapamil Other (See Comments)    dizziness    Patient Measurements: Height: 5\' 4"  (162.6 cm) Weight: 206 lb (93.4 kg) IBW/kg (Calculated) : 54.7 Heparin Dosing Weight: 75kg  Vital Signs: Temp: 97.9 F (36.6 C) (08/27 1150) Temp Source: Skin (08/27 1150) BP: 171/100 (08/27 1558) Pulse Rate: 106 (08/27 1609)  Labs: Recent Labs    10/08/18 1616  HGB 10.4*  HCT 34.0  PLT 331  CREATININE 0.72    Estimated Creatinine Clearance: 69.4 mL/min (by C-G formula based on SCr of 0.72 mg/dL).   Medical History: Past Medical History:  Diagnosis Date  . Anemia   . Anxiety state    NOS  . Arthritis    osteo...right knee  . CAD (coronary artery disease)    NSTEMI 8/13 => LHC 09/27/11: oLM 50%, dLM 30%, oLAD 60-70%, pLAD 50%, mLAD 70%, pD1 30%, pD2 40%, oRCA 25%, mRCA 40%, EF 50% with ant HK => LAD not amenable to PCI => med Rx  unless recurrent angina = > consider CABG  . Carpal tunnel syndrome   . Cataract   . Complication of anesthesia   . Corneal disorder   . Diverticular disease   . Dysmetabolic syndrome X   . GERD (gastroesophageal reflux disease)   . 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   [name] Headache, paroxysmal hemicrania, chronic 07/23/2012    Referral to BOTOX evaluation with dr Rexene Alberts or Krista Blue .  [name] Heart attack ([place]) 09/2011  . Hernia    hiatal  . HLD (hyperlipidemia)   . Hx of adenomatous colonic polyps   . Hypertension    essen. NOS  . Ischemic  cardiomyopathy    a.  Echo 09/29/11: EF 30-35% with inferior, posterior, lateral AK, anterior severe HK, mild LVH, mild MR, PASP 40;  => b. follow up echo 10/13:  EF 55-60%, Gr 1 diast dysfn, mild MR  . Migraine headache without aura 07/23/2012    Left sided  Lamonte Sakai thought to be migrainous  , hypersensitivity to touch , photophobia ,  no nausea.   [name] PONV (postoperative nausea and vomiting)   . Shoulder fracture   . Sleep apnea    CPAP    Assessment: 27 yoF admitted for LHC and found to have 3vCAD. Pharmacy asked to start IV heparin 6hr after sheath removed (~1700) without bolus while awaiting CABG workup.   Goal of Therapy:  Heparin level 0.3-0.7 units/ml Monitor platelets by anticoagulation protocol: Yes   Plan:  -Begin heparin 900 units/hr with no bolus at 2300 tonight -Check heparin level in 8hr -F/U CABG workup   Arrie Senate, PharmD, BCPS Clinical Pharmacist [phone] Please check AMION for all [place] numbers 10/10/2018

## 2018-10-11 ENCOUNTER — Inpatient Hospital Stay (HOSPITAL_COMMUNITY): Payer: Medicare Other

## 2018-10-11 ENCOUNTER — Encounter (HOSPITAL_COMMUNITY): Payer: Self-pay | Admitting: Cardiovascular Disease

## 2018-10-11 DIAGNOSIS — I25110 Atherosclerotic heart disease of native coronary artery with unstable angina pectoris: Secondary | ICD-10-CM

## 2018-10-11 DIAGNOSIS — Z01810 Encounter for preprocedural cardiovascular examination: Secondary | ICD-10-CM

## 2018-10-11 DIAGNOSIS — I2510 Atherosclerotic heart disease of native coronary artery without angina pectoris: Secondary | ICD-10-CM

## 2018-10-11 DIAGNOSIS — I200 Unstable angina: Secondary | ICD-10-CM

## 2018-10-11 DIAGNOSIS — Z789 Other specified health status: Secondary | ICD-10-CM

## 2018-10-11 DIAGNOSIS — I255 Ischemic cardiomyopathy: Secondary | ICD-10-CM

## 2018-10-11 LAB — PULMONARY FUNCTION TEST
FEF 25-75 Post: 2.02 L/sec
FEF 25-75 Pre: 1.48 L/sec
FEF2575-%Change-Post: 36 %
FEF2575-%Pred-Post: 116 %
FEF2575-%Pred-Pre: 85 %
FEV1-%Change-Post: 13 %
FEV1-%Pred-Post: 69 %
FEV1-%Pred-Pre: 61 %
FEV1-Post: 1.48 L
FEV1-Pre: 1.31 L
FEV1FVC-%Change-Post: 4 %
FEV1FVC-%Pred-Pre: 109 %
FEV6-%Change-Post: 6 %
FEV6-%Pred-Post: 62 %
FEV6-%Pred-Pre: 59 %
FEV6-Post: 1.69 L
FEV6-Pre: 1.59 L
FEV6FVC-%Pred-Post: 104 %
FEV6FVC-%Pred-Pre: 104 %
FVC-%Change-Post: 8 %
FVC-%Pred-Post: 60 %
FVC-%Pred-Pre: 56 %
FVC-Post: 1.72 L
FVC-Pre: 1.59 L
Post FEV1/FVC ratio: 86 %
Post FEV6/FVC ratio: 100 %
Pre FEV1/FVC ratio: 82 %
Pre FEV6/FVC Ratio: 100 %

## 2018-10-11 LAB — CBC
HCT: 32.6 % — ABNORMAL LOW (ref 36.0–46.0)
Hemoglobin: 9.8 g/dL — ABNORMAL LOW (ref 12.0–15.0)
MCH: 24.9 pg — ABNORMAL LOW (ref 26.0–34.0)
MCHC: 30.1 g/dL (ref 30.0–36.0)
MCV: 83 fL (ref 80.0–100.0)
Platelets: 361 10*3/uL (ref 150–400)
RBC: 3.93 MIL/uL (ref 3.87–5.11)
RDW: 15.9 % — ABNORMAL HIGH (ref 11.5–15.5)
WBC: 10.1 10*3/uL (ref 4.0–10.5)
nRBC: 0 % (ref 0.0–0.2)

## 2018-10-11 LAB — BASIC METABOLIC PANEL
Anion gap: 9 (ref 5–15)
BUN: 13 mg/dL (ref 8–23)
CO2: 26 mmol/L (ref 22–32)
Calcium: 9.3 mg/dL (ref 8.9–10.3)
Chloride: 103 mmol/L (ref 98–111)
Creatinine, Ser: 0.81 mg/dL (ref 0.44–1.00)
GFR calc Af Amer: >60 mL/min (ref >60)
GFR calc non Af Amer: >60 mL/min (ref >60)
Glucose, Bld: 148 mg/dL — ABNORMAL HIGH (ref 70–99)
Potassium: 3.9 mmol/L (ref 3.5–5.1)
Sodium: 138 mmol/L (ref 135–145)

## 2018-10-11 LAB — PLATELET INHIBITION P2Y12: Platelet Function  P2Y12: 278 [PRU] (ref 182–335)

## 2018-10-11 LAB — ECHOCARDIOGRAM COMPLETE
Height: 64 in
Weight: 3296 oz

## 2018-10-11 LAB — HEPARIN LEVEL (UNFRACTIONATED): Heparin Unfractionated: 0.3 IU/mL (ref 0.30–0.70)

## 2018-10-11 MED ORDER — HYDRALAZINE HCL 20 MG/ML IJ SOLN: 10.0000 mg | Freq: Three times a day (TID) | INTRAMUSCULAR | Status: DC | PRN

## 2018-10-11 MED ORDER — ALBUTEROL SULFATE (2.5 MG/3ML) 0.083% IN NEBU
2.5000 mg | INHALATION_SOLUTION | Freq: Once | RESPIRATORY_TRACT | Status: AC
  Administered 2018-10-11: 11:00:00 2.5 mg via RESPIRATORY_TRACT

## 2018-10-11 MED ORDER — ALPRAZOLAM 0.25 MG PO TABS
0.2500 mg | ORAL_TABLET | Freq: Every evening | ORAL | Status: DC | PRN
  Administered 2018-10-11 – 2018-10-13 (×4): 0.25 mg via ORAL
  Filled 2018-10-11 (×4): qty 1

## 2018-10-11 MED ORDER — NITROGLYCERIN IN D5W 200-5 MCG/ML-% IV SOLN
0.0000 ug/min | INTRAVENOUS | Status: DC
  Administered 2018-10-14: 15 ug/min via INTRAVENOUS
  Filled 2018-10-11 (×2): qty 250

## 2018-10-11 NOTE — Progress Notes (Signed)
1 Day Post-Op Procedure(s) (LRB): LEFT HEART CATH AND CORONARY ANGIOGRAPHY (N/A) Subjective: Has had some headache but no chest pain or shortness of breath. Up in chair. Has not ambulated.  Objective: Vital signs in last 24 hours: Temp:  [98 F (36.7 C)-98.3 F (36.8 C)] 98.3 F (36.8 C) (08/28 1118) Pulse Rate:  [63-114] 72 (08/28 1325) Cardiac Rhythm: Normal sinus rhythm (08/27 2000) Resp:  [11-30] 14 (08/28 1325) BP: (127-193)/(68-128) 144/75 (08/28 1325) SpO2:  [91 %-100 %] 98 % (08/28 1325)  Hemodynamic parameters for last 24 hours:    Intake/Output from previous day: 08/27 0701 - 08/28 0700 In: 300.4 [I.V.:300.4] Out: 2750 [Urine:2750] Intake/Output this shift: Total I/O In: 316.8 [P.O.:220; I.V.:96.8] Out: 0   General appearance: alert and cooperative Heart: regular rate and rhythm, S1, S2 normal, no murmur Lungs: clear to auscultation bilaterally  Lab Results: Recent Labs    10/08/18 1616 10/11/18 0658  WBC 7.3 10.1  HGB 10.4* 9.8*  HCT 34.0 32.6*  PLT 331 361   BMET:  Recent Labs    10/08/18 1616 10/10/18 1400 10/11/18 0658  NA 135  --  138  K 5.3* 4.0 3.9  CL 99  --  103  CO2 22  --  26  GLUCOSE 149*  --  148*  BUN 8  --  13  CREATININE 0.72  --  0.81  CALCIUM 9.4  --  9.3    PT/INR: No results for input(s): LABPROT, INR in the last 72 hours. ABG    Component Value Date/Time   TCO2 20 09/27/2011 2245   CBG (last 3)  No results for input(s): GLUCAP in the last 72 hours.  Assessment/Plan: S/P Procedure(s) (LRB): LEFT HEART CATH AND CORONARY ANGIOGRAPHY (N/A) Severe multi-vessel CAD with unstable angina. Plan CABG Monday. Patient has no further questions.   LOS: 1 day    Gaye Pollack 10/11/2018

## 2018-10-11 NOTE — Progress Notes (Signed)
Holding ambulation due to LM. Pt in recliner, happy to be up. Discussed sternal precautions, IS (1500 mL), mobility post op, and d/c planning. Good reception, thankful for materials and plans to review them over the weekend. Encouraged IS and room mobility. Her family will be with her at d/c.  [place] CES, ACSM 2:09 PM 10/11/2018

## 2018-10-11 NOTE — Plan of Care (Signed)
  Problem: Education: Goal: Knowledge of General Education information will improve Description: Including pain rating scale, medication(s)/side effects and non-pharmacologic comfort measures Outcome: Progressing   Problem: Coping: Goal: Level of anxiety will decrease Outcome: Progressing Note: PRN xanax ordered

## 2018-10-11 NOTE — Progress Notes (Signed)
Set up patient's personal CPAP machine for her.  Patient self-manages machine.  I did place her humidification for her, and patient can place herself on and off machine.  No distress noted. Patient sat is 100%, hr 79, rate 19.  Will continue to monitor.

## 2018-10-11 NOTE — Progress Notes (Signed)
Progress Note  Patient Name: Connie Owens Date of Encounter: 10/11/2018  Primary Cardiologist: Minus Breeding, MD   Subjective   Comfortable in bed.  No chest pain no shortness of breath  Inpatient Medications    Scheduled Meds: . aspirin EC  81 mg Oral Daily  . carvedilol  6.25 mg Oral BID  . Chlorhexidine Gluconate Cloth  6 each Topical Daily  . estradiol  1 mg Oral QHS  . losartan  100 mg Oral Daily  . prednisoLONE acetate  1 drop Both Eyes QHS  . sodium chloride flush  3 mL Intravenous Q12H  . sodium chloride flush  3 mL Intravenous Q12H   Continuous Infusions: . sodium chloride    . heparin 900 Units/hr (10/11/18 0800)  . nitroGLYCERIN     PRN Meds: sodium chloride, acetaminophen, morphine injection, ondansetron (ZOFRAN) IV, sodium chloride flush   Vital Signs    Vitals:   10/11/18 0600 10/11/18 0700 10/11/18 0820 10/11/18 0900  BP: (!) 167/76  (!) 141/86 127/73  Pulse: 68  72 66  Resp: 15  18 15   Temp:  98 F (36.7 C)    TempSrc:  Oral    SpO2: 97%  98% 100%  Weight:      Height:        Intake/Output Summary (Last 24 hours) at 10/11/2018 0935 Last data filed at 10/11/2018 0900 Gross per 24 hour  Intake 380.1 ml  Output 2750 ml  Net -2369.9 ml   Last 3 Weights 10/10/2018 10/08/2018 09/18/2018  Weight (lbs) 206 lb 206 lb 9.6 oz 208 lb  Weight (kg) 93.441 kg 93.713 kg 94.348 kg      Telemetry    Sinus rhythm- Personally Reviewed  ECG    Sinus rhythm- Personally Reviewed  Physical Exam   GEN: No acute distress.   Neck: No JVD Cardiac: RRR, no murmurs, rubs, or gallops.  Respiratory: Clear to auscultation bilaterally. GI: Soft, nontender, non-distended  MS: No edema; No deformity. Neuro:  Nonfocal  Psych: Normal affect   Labs    High Sensitivity Troponin:  No results for input(s): TROPONINIHS in the last 720 hours.    Chemistry Recent Labs  Lab 10/08/18 1616 10/10/18 1400 10/11/18 0658  NA 135  --  138  K 5.3* 4.0 3.9  CL 99   --  103  CO2 22  --  26  GLUCOSE 149*  --  148*  BUN 8  --  13  CREATININE 0.72  --  0.81  CALCIUM 9.4  --  9.3  GFRNONAA 83  --  >60  GFRAA 96  --  >60  ANIONGAP  --   --  9     Hematology Recent Labs  Lab 10/08/18 1616 10/11/18 0658  WBC 7.3 10.1  RBC 4.22 3.93  HGB 10.4* 9.8*  HCT 34.0 32.6*  MCV 81 83.0  MCH 24.6* 24.9*  MCHC 30.6* 30.1  RDW 15.3 15.9*  PLT 331 361    BNPNo results for input(s): BNP, PROBNP in the last 168 hours.   DDimer No results for input(s): DDIMER in the last 168 hours.   Radiology    No results found.  Cardiac Studies   Catheterization-three-vessel CAD Diagnostic Dominance: Right   Patient Profile     73 y.o. female with multivessel coronary artery disease awaiting CABG.  Plavix washout.  Assessment & Plan    Coronary artery disease prior non-STEMI 2013-treated medically at the time - CABG planned for Monday.  Platelet activation will be checked by Dr. Cyndia Bent.  Severe left main disease. - She did relate to me that she was worried about the possibility of a blood transfusion.  She states that her sister approximately 10 to 15 years ago was having bypass surgery and remembers having a blood transfusion and then ended up in a coma for 3 months.  This is the story she tells me.    Ischemic cardiomyopathy - EF has normalized.  Hyperlipidemia - Statin intolerance, Zetia intolerant.  On Repatha, PCSK9 inhibitor.  Essential hypertension -Stable.  For questions or updates, please contact [place] Please consult [email or web address] for contact info under        Signed, Candee Furbish, MD  10/11/2018, 9:35 AM

## 2018-10-11 NOTE — Progress Notes (Signed)
Doppler Pre Cabg  has been completed. Refer to [facility] under chart review to view preliminary results.   10/11/2018  3:38 PM Ilya Ess, Bonnye Fava

## 2018-10-11 NOTE — Progress Notes (Addendum)
[place] for heparin Indication: chest pain/ACS  Allergies  Allergen Reactions  . Iohexol Rash    rash  . Iodinated Diagnostic Agents Other (See Comments) and Rash    Other Reaction: Other reaction  . Statins Other (See Comments)    Cause muscle aches/pains  . Imdur [Isosorbide Nitrate]     Severe headaches  . Latex Rash  . Tape Rash and Other (See Comments)    Plastic tap-blisters  . Verapamil Other (See Comments)    dizziness    Patient Measurements: Height: 5\' 4"  (162.6 cm) Weight: 206 lb (93.4 kg) IBW/kg (Calculated) : 54.7 Heparin Dosing Weight: 75kg  Vital Signs: Temp: 98 F (36.7 C) (08/28 0700) Temp Source: Oral (08/28 0700) BP: 167/76 (08/28 0600) Pulse Rate: 68 (08/28 0600)  Labs: Recent Labs    10/08/18 1616 10/11/18 0658  HGB 10.4* 9.8*  HCT 34.0 32.6*  PLT 331 361  HEPARINUNFRC  --  0.30  CREATININE 0.72 0.81    Estimated Creatinine Clearance: 68.6 mL/min (by C-G formula based on SCr of 0.81 mg/dL).   Medical History: Past Medical History:  Diagnosis Date  . Anemia   . Anxiety state    NOS  . Arthritis    osteo...right knee  . CAD (coronary artery disease)    NSTEMI 8/13 => LHC 09/27/11: oLM 50%, dLM 30%, oLAD 60-70%, pLAD 50%, mLAD 70%, pD1 30%, pD2 40%, oRCA 25%, mRCA 40%, EF 50% with ant HK => LAD not amenable to PCI => med Rx  unless recurrent angina = > consider CABG  . Carpal tunnel syndrome   . Cataract   . Complication of anesthesia   . Corneal disorder   . Diverticular disease   . Dysmetabolic syndrome X   . GERD (gastroesophageal reflux disease)   . 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   [name] Headache, paroxysmal hemicrania, chronic 07/23/2012    Referral to BOTOX evaluation with dr Rexene Alberts or Krista Blue .  [name] Heart attack ([place]) 09/2011  . Hernia    hiatal  . HLD (hyperlipidemia)   . Hx of adenomatous colonic polyps   .  Hypertension    essen. NOS  . Ischemic cardiomyopathy    a.  Echo 09/29/11: EF 30-35% with inferior, posterior, lateral AK, anterior severe HK, mild LVH, mild MR, PASP 40;  => b. follow up echo 10/13:  EF 55-60%, Gr 1 diast dysfn, mild MR  . Migraine headache without aura 07/23/2012    Left sided  Lamonte Sakai thought to be migrainous  , hypersensitivity to touch , photophobia ,  no nausea.   [name] PONV (postoperative nausea and vomiting)   . Shoulder fracture   . Sleep apnea    CPAP    Assessment: 22 yoF admitted for [facility] and found to have 3vCAD and for CABG on 8/31.Pharmacy dosing heparin  -heparin level at the low end of goal , CBC stable  Goal of Therapy:  Heparin level 0.3-0.7 units/ml Monitor platelets by anticoagulation protocol: Yes   Plan:  -Increase heparin to 950 units/hr to keep in goal -Daily heparin level and CBC  Hildred Laser, PharmD Clinical Pharmacist **Pharmacist phone directory can now be found on [email or web address] ([email or web address]).  Listed under [place].

## 2018-10-11 NOTE — Progress Notes (Signed)
  Echocardiogram 2D Echocardiogram has been performed.  Connie Owens 10/11/2018, 4:54 PM

## 2018-10-12 DIAGNOSIS — I10 Essential (primary) hypertension: Secondary | ICD-10-CM

## 2018-10-12 LAB — CBC
HCT: 31.4 % — ABNORMAL LOW (ref 36.0–46.0)
Hemoglobin: 9.7 g/dL — ABNORMAL LOW (ref 12.0–15.0)
MCH: 25.3 pg — ABNORMAL LOW (ref 26.0–34.0)
MCHC: 30.9 g/dL (ref 30.0–36.0)
MCV: 82 fL (ref 80.0–100.0)
Platelets: 334 10*3/uL (ref 150–400)
RBC: 3.83 MIL/uL — ABNORMAL LOW (ref 3.87–5.11)
RDW: 15.9 % — ABNORMAL HIGH (ref 11.5–15.5)
WBC: 10.1 10*3/uL (ref 4.0–10.5)
nRBC: 0 % (ref 0.0–0.2)

## 2018-10-12 LAB — HEPARIN LEVEL (UNFRACTIONATED): Heparin Unfractionated: 0.32 IU/mL (ref 0.30–0.70)

## 2018-10-12 MED ORDER — VANCOMYCIN HCL 10 G IV SOLR
1500.0000 mg | INTRAVENOUS | Status: AC
  Administered 2018-10-14: 08:00:00 1500 mg via INTRAVENOUS
  Filled 2018-10-12: qty 1500

## 2018-10-12 MED ORDER — TRANEXAMIC ACID (OHS) PUMP PRIME SOLUTION
2.0000 mg/kg | INTRAVENOUS | Status: DC
  Filled 2018-10-12: qty 1.84

## 2018-10-12 MED ORDER — PLASMA-LYTE 148 IV SOLN
INTRAVENOUS | Status: DC
  Filled 2018-10-12 (×2): qty 2.5

## 2018-10-12 MED ORDER — EPINEPHRINE HCL 5 MG/250ML IV SOLN IN NS
0.0000 ug/min | INTRAVENOUS | Status: DC
  Filled 2018-10-12 (×2): qty 250

## 2018-10-12 MED ORDER — DEXMEDETOMIDINE HCL IN NACL 400 MCG/100ML IV SOLN
0.1000 ug/kg/h | INTRAVENOUS | Status: DC
  Filled 2018-10-12 (×2): qty 100

## 2018-10-12 MED ORDER — PHENYLEPHRINE HCL-NACL 20-0.9 MG/250ML-% IV SOLN
30.0000 ug/min | INTRAVENOUS | Status: DC
  Filled 2018-10-12 (×2): qty 250

## 2018-10-12 MED ORDER — TRANEXAMIC ACID 1000 MG/10ML IV SOLN
1.5000 mg/kg/h | INTRAVENOUS | Status: AC
  Administered 2018-10-14: 09:00:00 1.5 mg/kg/h via INTRAVENOUS
  Filled 2018-10-12 (×2): qty 25

## 2018-10-12 MED ORDER — NORTRIPTYLINE HCL 10 MG PO CAPS
20.0000 mg | ORAL_CAPSULE | ORAL | Status: DC | PRN
  Administered 2018-10-12 (×2): 20 mg via ORAL
  Filled 2018-10-12 (×3): qty 2

## 2018-10-12 MED ORDER — SODIUM CHLORIDE 0.9 % IV SOLN
INTRAVENOUS | Status: DC
  Filled 2018-10-12 (×2): qty 30

## 2018-10-12 MED ORDER — TRANEXAMIC ACID (OHS) BOLUS VIA INFUSION
15.0000 mg/kg | INTRAVENOUS | Status: AC
  Administered 2018-10-14: 08:00:00 1380 mg via INTRAVENOUS
  Filled 2018-10-12: qty 1380

## 2018-10-12 MED ORDER — SODIUM CHLORIDE 0.9 % IV SOLN
1.5000 g | INTRAVENOUS | Status: AC
  Administered 2018-10-14: 08:00:00 1.5 g via INTRAVENOUS
  Filled 2018-10-12 (×2): qty 1.5

## 2018-10-12 MED ORDER — SODIUM CHLORIDE 0.9 % IV SOLN
750.0000 mg | INTRAVENOUS | Status: AC
  Administered 2018-10-14: 750 mg via INTRAVENOUS
  Filled 2018-10-12 (×2): qty 750

## 2018-10-12 MED ORDER — NITROGLYCERIN IN D5W 200-5 MCG/ML-% IV SOLN
2.0000 ug/min | INTRAVENOUS | Status: DC
  Filled 2018-10-12 (×2): qty 250

## 2018-10-12 MED ORDER — DOPAMINE-DEXTROSE 3.2-5 MG/ML-% IV SOLN
0.0000 ug/kg/min | INTRAVENOUS | Status: DC
  Filled 2018-10-12 (×3): qty 250

## 2018-10-12 MED ORDER — POTASSIUM CHLORIDE 2 MEQ/ML IV SOLN
80.0000 meq | INTRAVENOUS | Status: DC
  Filled 2018-10-12: qty 40

## 2018-10-12 MED ORDER — MAGNESIUM SULFATE 50 % IJ SOLN
40.0000 meq | INTRAMUSCULAR | Status: DC
  Filled 2018-10-12 (×2): qty 9.85

## 2018-10-12 MED ORDER — ALPRAZOLAM 0.25 MG PO TABS
0.2500 mg | ORAL_TABLET | Freq: Once | ORAL | Status: AC
  Administered 2018-10-12: 0.25 mg via ORAL
  Filled 2018-10-12: qty 1

## 2018-10-12 MED ORDER — INSULIN REGULAR(HUMAN) IN NACL 100-0.9 UT/100ML-% IV SOLN
INTRAVENOUS | Status: AC
  Administered 2018-10-14: 08:00:00 1.2 [IU]/h via INTRAVENOUS
  Filled 2018-10-12 (×2): qty 100

## 2018-10-12 MED ORDER — NOREPINEPHRINE 4 MG/250ML-% IV SOLN
0.0000 ug/min | INTRAVENOUS | Status: DC
  Filled 2018-10-12 (×2): qty 250

## 2018-10-12 MED ORDER — MILRINONE LACTATE IN DEXTROSE 20-5 MG/100ML-% IV SOLN
0.3000 ug/kg/min | INTRAVENOUS | Status: DC
  Filled 2018-10-12 (×2): qty 100

## 2018-10-12 NOTE — Progress Notes (Signed)
[place] for heparin Indication: chest pain/ACS  Allergies  Allergen Reactions  . Iohexol Rash    rash  . Iodinated Diagnostic Agents Other (See Comments) and Rash    Other Reaction: Other reaction  . Statins Other (See Comments)    Cause muscle aches/pains  . Imdur [Isosorbide Nitrate]     Severe headaches  . Latex Rash  . Tape Rash and Other (See Comments)    Plastic tap-blisters  . Verapamil Other (See Comments)    dizziness    Patient Measurements: Height: 5\' 4"  (162.6 cm) Weight: 202 lb 13.2 oz (92 kg) IBW/kg (Calculated) : 54.7 Heparin Dosing Weight: 75kg  Vital Signs: Temp: 98.2 F (36.8 C) (08/29 0750) Temp Source: Oral (08/29 0750) BP: 171/77 (08/29 0900) Pulse Rate: 75 (08/29 0900)  Labs: Recent Labs    10/11/18 0658 10/12/18 0254  HGB 9.8* 9.7*  HCT 32.6* 31.4*  PLT 361 334  HEPARINUNFRC 0.30 0.32  CREATININE 0.81  --     Estimated Creatinine Clearance: 68 mL/min (by C-G formula based on SCr of 0.81 mg/dL).   Medical History: Past Medical History:  Diagnosis Date  . Anemia   . Anxiety state    NOS  . Arthritis    osteo...right knee  . CAD (coronary artery disease)    NSTEMI 8/13 => LHC 09/27/11: oLM 50%, dLM 30%, oLAD 60-70%, pLAD 50%, mLAD 70%, pD1 30%, pD2 40%, oRCA 25%, mRCA 40%, EF 50% with ant HK => LAD not amenable to PCI => med Rx  unless recurrent angina = > consider CABG  . Carpal tunnel syndrome   . Cataract   . Complication of anesthesia   . Corneal disorder   . Diverticular disease   . Dysmetabolic syndrome X   . GERD (gastroesophageal reflux disease)   . 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   [name] Headache, paroxysmal hemicrania, chronic 07/23/2012    Referral to BOTOX evaluation with dr Rexene Alberts or Krista Blue .  [name] Heart attack ([place]) 09/2011  . Hernia    hiatal  . HLD (hyperlipidemia)   . Hx of adenomatous colonic polyps   .  Hypertension    essen. NOS  . Ischemic cardiomyopathy    a.  Echo 09/29/11: EF 30-35% with inferior, posterior, lateral AK, anterior severe HK, mild LVH, mild MR, PASP 40;  => b. follow up echo 10/13:  EF 55-60%, Gr 1 diast dysfn, mild MR  . Migraine headache without aura 07/23/2012    Left sided  Lamonte Sakai thought to be migrainous  , hypersensitivity to touch , photophobia ,  no nausea.   [name] PONV (postoperative nausea and vomiting)   . Shoulder fracture   . Sleep apnea    CPAP    Assessment: 46 yoF admitted for [facility] and found to have 3vCAD and for CABG on 8/31. Pharmacy dosing heparin   Heparin level at goal on 950 units/hr. No bleeding noted, cbc stable.   Goal of Therapy:  Heparin level 0.3-0.7 units/ml Monitor platelets by anticoagulation protocol: Yes   Plan:  -Continue heparin at 950 units/hr  -Daily heparin level and CBC  Erin Hearing PharmD., BCPS Clinical Pharmacist 10/12/2018 9:10 AM

## 2018-10-12 NOTE — Progress Notes (Signed)
Patient with nausea, diarrhea, and anxiety about upcomming surgery. Patient not wanting to eat because of nausea. Zofran administered with some improvement. Pt denies any chest pain. Patient alert and comfortable in bed. Pt requesting " something for my nerves." PA paged for PRN xanax instead of just QHS. Will continue to monitor.  Lucius Conn, RN

## 2018-10-12 NOTE — Progress Notes (Signed)
Progress Note   Subjective   Doing well today, the patient denies CP or SOB.  Nauseated this am, now resolved.  Anxious about surgery on Monday but doing ok overall.     Inpatient Medications    Scheduled Meds: . aspirin EC  81 mg Oral Daily  . carvedilol  6.25 mg Oral BID  . Chlorhexidine Gluconate Cloth  6 each Topical Daily  . [START ON 10/14/2018] epinephrine  0-10 mcg/min Intravenous To OR  . estradiol  1 mg Oral QHS  . [START ON 10/14/2018] heparin-papaverine-plasmalyte irrigation   Irrigation To OR  . [START ON 10/14/2018] insulin   Intravenous To OR  . losartan  100 mg Oral Daily  . [START ON 10/14/2018] magnesium sulfate  40 mEq Other To OR  . [START ON 10/14/2018] phenylephrine  30-200 mcg/min Intravenous To OR  . [START ON 10/14/2018] potassium chloride  80 mEq Other To OR  . prednisoLONE acetate  1 drop Both Eyes QHS  . sodium chloride flush  3 mL Intravenous Q12H  . sodium chloride flush  3 mL Intravenous Q12H  . [START ON 10/14/2018] tranexamic acid  15 mg/kg Intravenous To OR  . [START ON 10/14/2018] tranexamic acid  2 mg/kg Intracatheter To OR   Continuous Infusions: . sodium chloride 10 mL/hr at 10/12/18 1100  . [START ON 10/14/2018] cefUROXime (ZINACEF)  IV    . [START ON 10/14/2018] cefUROXime (ZINACEF)  IV    . [START ON 10/14/2018] dexmedetomidine    . [START ON 10/14/2018] DOPamine    . [START ON 10/14/2018] heparin 30,000 units/NS 1000 mL solution for CELLSAVER    . heparin 950 Units/hr (10/12/18 1100)  . [START ON 10/14/2018] milrinone    . nitroGLYCERIN 10 mcg/min (10/12/18 1100)  . [START ON 10/14/2018] nitroGLYCERIN    . [START ON 10/14/2018] norepinephrine (LEVOPHED) Adult infusion    . [START ON 10/14/2018] tranexamic acid (CYKLOKAPRON) infusion ([facility])    . [START ON 10/14/2018] vancomycin     PRN Meds: sodium chloride, acetaminophen, ALPRAZolam, hydrALAZINE, morphine injection, nortriptyline, ondansetron (ZOFRAN) IV, sodium chloride flush   Vital Signs   Vitals:   10/12/18 0930 10/12/18 1000 10/12/18 1030 10/12/18 1100  BP: (!) 158/81 (!) 150/80 (!) 147/86 109/75  Pulse: 62 62 72 91  Resp: 14 17 (!) 21 (!) 21  Temp:      TempSrc:      SpO2: 98% 93% 92% 99%  Weight:      Height:        Intake/Output Summary (Last 24 hours) at 10/12/2018 1237 Last data filed at 10/12/2018 1100 Gross per 24 hour  Intake 936.56 ml  Output 650 ml  Net 286.56 ml   Filed Weights   10/10/18 1150 10/12/18 0600  Weight: 93.4 kg 92 kg    Telemetry    sinus - Personally Reviewed  Physical Exam   GEN- The patient is well appearing, alert and oriented x 3 today.   Head- normocephalic, atraumatic Eyes-  Sclera clear, conjunctiva pink Ears- hearing intact Oropharynx- clear Neck- supple, Lungs-   normal work of breathing Heart- Regular rate and rhythm  GI- soft, NT, ND, + BS Extremities- no clubbing, cyanosis, or edema  MS- no significant deformity or atrophy Skin- no rash or lesion Psych- euthymic mood, full affect Neuro- strength and sensation are intact   Labs    Chemistry Recent Labs  Lab 10/08/18 1616 10/10/18 1400 10/11/18 0658  NA 135  --  138  K 5.3* 4.0  3.9  CL 99  --  103  CO2 22  --  26  GLUCOSE 149*  --  148*  BUN 8  --  13  CREATININE 0.72  --  0.81  CALCIUM 9.4  --  9.3  GFRNONAA 83  --  >60  GFRAA 96  --  >60  ANIONGAP  --   --  9     Hematology Recent Labs  Lab 10/08/18 1616 10/11/18 0658 10/12/18 0254  WBC 7.3 10.1 10.1  RBC 4.22 3.93 3.83*  HGB 10.4* 9.8* 9.7*  HCT 34.0 32.6* 31.4*  MCV 81 83.0 82.0  MCH 24.6* 24.9* 25.3*  MCHC 30.6* 30.1 30.9  RDW 15.3 15.9* 15.9*  PLT 331 361 334       Patient Profile     73 y.o. female with multivessel coronary artery disease awaiting CABG on Monday.  Plavix washout.  Assessment & Plan    1.  CAD with severe LM disease Awaiting CABG, planned for Monday  2. HL On repatha  3. HTN Stable No change required today   Thompson Grayer MD, [facility] 10/12/2018  12:37 PM

## 2018-10-13 ENCOUNTER — Inpatient Hospital Stay (HOSPITAL_COMMUNITY): Payer: Medicare Other

## 2018-10-13 DIAGNOSIS — I2510 Atherosclerotic heart disease of native coronary artery without angina pectoris: Secondary | ICD-10-CM

## 2018-10-13 LAB — HEMOGLOBIN A1C
Hgb A1c MFr Bld: 6.4 % — ABNORMAL HIGH (ref 4.8–5.6)
Mean Plasma Glucose: 136.98 mg/dL

## 2018-10-13 LAB — CBC
HCT: 32.2 % — ABNORMAL LOW (ref 36.0–46.0)
Hemoglobin: 9.9 g/dL — ABNORMAL LOW (ref 12.0–15.0)
MCH: 24.9 pg — ABNORMAL LOW (ref 26.0–34.0)
MCHC: 30.7 g/dL (ref 30.0–36.0)
MCV: 81.1 fL (ref 80.0–100.0)
Platelets: 321 10*3/uL (ref 150–400)
RBC: 3.97 MIL/uL (ref 3.87–5.11)
RDW: 15.7 % — ABNORMAL HIGH (ref 11.5–15.5)
WBC: 9.5 10*3/uL (ref 4.0–10.5)
nRBC: 0 % (ref 0.0–0.2)

## 2018-10-13 LAB — URINALYSIS, ROUTINE W REFLEX MICROSCOPIC
Bilirubin Urine: NEGATIVE
Glucose, UA: NEGATIVE mg/dL
Hgb urine dipstick: NEGATIVE
Ketones, ur: NEGATIVE mg/dL
Leukocytes,Ua: NEGATIVE
Nitrite: NEGATIVE
Protein, ur: NEGATIVE mg/dL
Specific Gravity, Urine: 1.008 (ref 1.005–1.030)
pH: 7 (ref 5.0–8.0)

## 2018-10-13 LAB — SURGICAL PCR SCREEN
MRSA, PCR: NEGATIVE
Staphylococcus aureus: NEGATIVE

## 2018-10-13 LAB — TYPE AND SCREEN
ABO/RH(D): O POS
Antibody Screen: NEGATIVE

## 2018-10-13 LAB — ABO/RH: ABO/RH(D): O POS

## 2018-10-13 LAB — HEPARIN LEVEL (UNFRACTIONATED): Heparin Unfractionated: 0.34 IU/mL (ref 0.30–0.70)

## 2018-10-13 MED ORDER — LATANOPROST 0.005 % OP SOLN
1.0000 [drp] | Freq: Every day | OPHTHALMIC | Status: DC
  Administered 2018-10-13: 1 [drp] via OPHTHALMIC

## 2018-10-13 MED ORDER — CHLORHEXIDINE GLUCONATE 0.12 % MT SOLN
15.0000 mL | Freq: Once | OROMUCOSAL | Status: AC
  Administered 2018-10-14: 15 mL via OROMUCOSAL
  Filled 2018-10-13: qty 15

## 2018-10-13 MED ORDER — METOPROLOL TARTRATE 12.5 MG HALF TABLET
12.5000 mg | ORAL_TABLET | Freq: Once | ORAL | Status: AC
  Administered 2018-10-14: 12.5 mg via ORAL
  Filled 2018-10-13: qty 1

## 2018-10-13 MED ORDER — TEMAZEPAM 15 MG PO CAPS: 15.0000 mg | ORAL_CAPSULE | Freq: Once | ORAL | Status: DC | PRN

## 2018-10-13 MED ORDER — BRIMONIDINE TARTRATE-TIMOLOL 0.2-0.5 % OP SOLN
1.0000 [drp] | Freq: Two times a day (BID) | OPHTHALMIC | Status: DC
  Administered 2018-10-13: 1 [drp] via OPHTHALMIC

## 2018-10-13 MED ORDER — DIAZEPAM 5 MG PO TABS
5.0000 mg | ORAL_TABLET | Freq: Once | ORAL | Status: AC
  Administered 2018-10-14: 5 mg via ORAL
  Filled 2018-10-13: qty 1

## 2018-10-13 MED ORDER — CHLORHEXIDINE GLUCONATE CLOTH 2 % EX PADS
6.0000 | MEDICATED_PAD | Freq: Once | CUTANEOUS | Status: AC
  Administered 2018-10-14: 6 via TOPICAL

## 2018-10-13 MED ORDER — PREDNISOLONE ACETATE 1 % OP SUSP
1.0000 [drp] | Freq: Two times a day (BID) | OPHTHALMIC | Status: DC
  Administered 2018-10-13: 22:00:00 1 [drp] via OPHTHALMIC

## 2018-10-13 MED ORDER — CHLORHEXIDINE GLUCONATE CLOTH 2 % EX PADS
6.0000 | MEDICATED_PAD | Freq: Once | CUTANEOUS | Status: AC
  Administered 2018-10-13: 23:00:00 6 via TOPICAL

## 2018-10-13 MED ORDER — BISACODYL 5 MG PO TBEC: 5.0000 mg | DELAYED_RELEASE_TABLET | Freq: Once | ORAL | Status: DC

## 2018-10-13 NOTE — Progress Notes (Signed)
[place] for heparin Indication: chest pain/ACS  Allergies  Allergen Reactions  . Iohexol Rash    rash  . Iodinated Diagnostic Agents Other (See Comments) and Rash    Other Reaction: Other reaction  . Statins Other (See Comments)    Cause muscle aches/pains  . Imdur [Isosorbide Nitrate]     Severe headaches  . Latex Rash  . Tape Rash and Other (See Comments)    Plastic tap-blisters  . Verapamil Other (See Comments)    dizziness    Patient Measurements: Height: 5\' 4"  (162.6 cm) Weight: 202 lb 13.2 oz (92 kg) IBW/kg (Calculated) : 54.7 Heparin Dosing Weight: 75kg  Vital Signs: Temp: 97.8 F (36.6 C) (08/30 0800) Temp Source: Oral (08/30 0800) BP: 138/67 (08/30 0700) Pulse Rate: 102 (08/30 0800)  Labs: Recent Labs    10/11/18 0658 10/12/18 0254 10/13/18 0328  HGB 9.8* 9.7* 9.9*  HCT 32.6* 31.4* 32.2*  PLT 361 334 321  HEPARINUNFRC 0.30 0.32 0.34  CREATININE 0.81  --   --     Estimated Creatinine Clearance: 68 mL/min (by C-G formula based on SCr of 0.81 mg/dL).   Medical History: Past Medical History:  Diagnosis Date  . Anemia   . Anxiety state    NOS  . Arthritis    osteo...right knee  . CAD (coronary artery disease)    NSTEMI 8/13 => LHC 09/27/11: oLM 50%, dLM 30%, oLAD 60-70%, pLAD 50%, mLAD 70%, pD1 30%, pD2 40%, oRCA 25%, mRCA 40%, EF 50% with ant HK => LAD not amenable to PCI => med Rx  unless recurrent angina = > consider CABG  . Carpal tunnel syndrome   . Cataract   . Complication of anesthesia   . Corneal disorder   . Diverticular disease   . Dysmetabolic syndrome X   . GERD (gastroesophageal reflux disease)   . 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   [name] Headache, paroxysmal hemicrania, chronic 07/23/2012    Referral to BOTOX evaluation with dr Rexene Alberts or Krista Blue .  [name] Heart attack ([place]) 09/2011  . Hernia    hiatal  . HLD (hyperlipidemia)    . Hx of adenomatous colonic polyps   . Hypertension    essen. NOS  . Ischemic cardiomyopathy    a.  Echo 09/29/11: EF 30-35% with inferior, posterior, lateral AK, anterior severe HK, mild LVH, mild MR, PASP 40;  => b. follow up echo 10/13:  EF 55-60%, Gr 1 diast dysfn, mild MR  . Migraine headache without aura 07/23/2012    Left sided  Lamonte Sakai thought to be migrainous  , hypersensitivity to touch , photophobia ,  no nausea.   [name] PONV (postoperative nausea and vomiting)   . Shoulder fracture   . Sleep apnea    CPAP    Assessment: 31 yoF admitted for [facility] and found to have 3vCAD and for CABG on 8/31. Pharmacy dosing heparin   Heparin level continues to be at goal on 950 units/hr. No bleeding noted, cbc stable.   Goal of Therapy:  Heparin level 0.3-0.7 units/ml Monitor platelets by anticoagulation protocol: Yes   Plan:  -Continue heparin at 950 units/hr  -Daily heparin level and CBC  Erin Hearing PharmD., BCPS Clinical Pharmacist 10/13/2018 8:43 AM

## 2018-10-13 NOTE — Progress Notes (Signed)
Progress Note   Subjective   Doing well today, the patient denies CP or SOB.  + loose stools this am which she attributes to IBS like symptoms.  No new concerns  Inpatient Medications    Scheduled Meds: . aspirin EC  81 mg Oral Daily  . carvedilol  6.25 mg Oral BID  . Chlorhexidine Gluconate Cloth  6 each Topical Daily  . [START ON 10/14/2018] epinephrine  0-10 mcg/min Intravenous To OR  . estradiol  1 mg Oral QHS  . [START ON 10/14/2018] heparin-papaverine-plasmalyte irrigation   Irrigation To OR  . [START ON 10/14/2018] insulin   Intravenous To OR  . losartan  100 mg Oral Daily  . [START ON 10/14/2018] magnesium sulfate  40 mEq Other To OR  . [START ON 10/14/2018] phenylephrine  30-200 mcg/min Intravenous To OR  . [START ON 10/14/2018] potassium chloride  80 mEq Other To OR  . prednisoLONE acetate  1 drop Both Eyes QHS  . sodium chloride flush  3 mL Intravenous Q12H  . sodium chloride flush  3 mL Intravenous Q12H  . [START ON 10/14/2018] tranexamic acid  15 mg/kg Intravenous To OR  . [START ON 10/14/2018] tranexamic acid  2 mg/kg Intracatheter To OR   Continuous Infusions: . sodium chloride 10 mL/hr at 10/13/18 1000  . [START ON 10/14/2018] cefUROXime (ZINACEF)  IV    . [START ON 10/14/2018] cefUROXime (ZINACEF)  IV    . [START ON 10/14/2018] dexmedetomidine    . [START ON 10/14/2018] DOPamine    . [START ON 10/14/2018] heparin 30,000 units/NS 1000 mL solution for CELLSAVER    . heparin 950 Units/hr (10/13/18 1000)  . [START ON 10/14/2018] milrinone    . nitroGLYCERIN Stopped (10/13/18 0925)  . [START ON 10/14/2018] nitroGLYCERIN    . [START ON 10/14/2018] norepinephrine (LEVOPHED) Adult infusion    . [START ON 10/14/2018] tranexamic acid (CYKLOKAPRON) infusion ([facility])    . [START ON 10/14/2018] vancomycin     PRN Meds: sodium chloride, acetaminophen, ALPRAZolam, hydrALAZINE, morphine injection, nortriptyline, ondansetron (ZOFRAN) IV, sodium chloride flush   Vital Signs    Vitals:    10/13/18 0700 10/13/18 0800 10/13/18 0900 10/13/18 1000  BP: 138/67 135/67 135/67 125/78  Pulse: 67 (!) 102 85 69  Resp: 16 16 18 19   Temp:  97.8 F (36.6 C)    TempSrc:  Oral    SpO2: 96% 94% 95% 91%  Weight:      Height:        Intake/Output Summary (Last 24 hours) at 10/13/2018 1100 Last data filed at 10/13/2018 1000 Gross per 24 hour  Intake 765.89 ml  Output 500 ml  Net 265.89 ml   Filed Weights   10/10/18 1150 10/12/18 0600  Weight: 93.4 kg 92 kg    Telemetry    sinus - Personally Reviewed  Physical Exam   GEN- The patient is well appearing, alert and oriented x 3 today.   Head- normocephalic, atraumatic Eyes-  Sclera clear, conjunctiva pink Ears- hearing intact Oropharynx- clear Neck- supple, Lungs-  normal work of breathing Heart- Regular rate and rhythm  GI- soft  Extremities- no clubbing, cyanosis, or edema  MS- no significant deformity or atrophy Skin- no rash or lesion Psych- euthymic mood, full affect Neuro- strength and sensation are intact   Labs    Chemistry Recent Labs  Lab 10/08/18 1616 10/10/18 1400 10/11/18 0658  NA 135  --  138  K 5.3* 4.0 3.9  CL 99  --  103  CO2 22  --  26  GLUCOSE 149*  --  148*  BUN 8  --  13  CREATININE 0.72  --  0.81  CALCIUM 9.4  --  9.3  GFRNONAA 83  --  >60  GFRAA 96  --  >60  ANIONGAP  --   --  9     Hematology Recent Labs  Lab 10/11/18 0658 10/12/18 0254 10/13/18 0328  WBC 10.1 10.1 9.5  RBC 3.93 3.83* 3.97  HGB 9.8* 9.7* 9.9*  HCT 32.6* 31.4* 32.2*  MCV 83.0 82.0 81.1  MCH 24.9* 25.3* 24.9*  MCHC 30.1 30.9 30.7  RDW 15.9* 15.9* 15.7*  PLT 361 334 321        Patient Profile   73 y.o.femalewith multivessel coronary artery disease awaiting CABG on Monday. Plavix washout.  Assessment & Plan    1.  CAD with severe LM disease Awaiting CABG, planned for Monday  2. HTN Stable No change required today  3. HL  On repatha  Thompson Grayer MD, [facility] 10/13/2018 11:00 AM

## 2018-10-13 NOTE — Plan of Care (Signed)

## 2018-10-13 NOTE — Anesthesia Preprocedure Evaluation (Addendum)
Anesthesia Evaluation  Patient identified by MRN, date of birth, ID band Patient awake    Reviewed: Allergy & Precautions, H&P , NPO status , Patient's Chart, lab work & pertinent test results  History of Anesthesia Complications (+) PONV  Airway Mallampati: II  TM Distance: >3 FB Neck ROM: Full    Dental no notable dental hx. (+) Edentulous Upper, Partial Lower, Dental Advisory Given   Pulmonary sleep apnea and Continuous Positive Airway Pressure Ventilation ,    Pulmonary exam normal breath sounds clear to auscultation       Cardiovascular Exercise Tolerance: Good hypertension, Pt. on medications and Pt. on home beta blockers + CAD and + Past MI   Rhythm:Regular Rate:Normal     Neuro/Psych  Headaches, Anxiety    GI/Hepatic Neg liver ROS, GERD  ,  Endo/Other  negative endocrine ROS  Renal/GU negative Renal ROS  negative genitourinary   Musculoskeletal  (+) Arthritis , Osteoarthritis,    Abdominal   Peds  Hematology  (+) Blood dyscrasia, anemia ,   Anesthesia Other Findings   Reproductive/Obstetrics negative OB ROS                            Anesthesia Physical Anesthesia Plan  ASA: IV  Anesthesia Plan: General   Post-op Pain Management:    Induction: Intravenous  PONV Risk Score and Plan: 4 or greater and Ondansetron, Midazolam and Treatment may vary due to age or medical condition  Airway Management Planned: Oral ETT  Additional Equipment: Arterial line, CVP, PA Cath, TEE and Ultrasound Guidance Line Placement  Intra-op Plan:   Post-operative Plan: Post-operative intubation/ventilation  Informed Consent: I have reviewed the patients History and Physical, chart, labs and discussed the procedure including the risks, benefits and alternatives for the proposed anesthesia with the patient or authorized representative who has indicated his/her understanding and acceptance.      Dental advisory given  Plan Discussed with: CRNA  Anesthesia Plan Comments:         Anesthesia Quick Evaluation

## 2018-10-14 ENCOUNTER — Inpatient Hospital Stay (HOSPITAL_COMMUNITY): Payer: Medicare Other | Admitting: Certified Registered Nurse Anesthetist

## 2018-10-14 ENCOUNTER — Inpatient Hospital Stay (HOSPITAL_COMMUNITY): Payer: Medicare Other

## 2018-10-14 ENCOUNTER — Encounter (HOSPITAL_COMMUNITY): Payer: Self-pay | Admitting: Certified Registered Nurse Anesthetist

## 2018-10-14 ENCOUNTER — Inpatient Hospital Stay (HOSPITAL_COMMUNITY)
Admission: RE | Disposition: A | Discharge status: Home Health | Payer: Self-pay | Source: Home / Self Care | Attending: Surgery

## 2018-10-14 DIAGNOSIS — I25110 Atherosclerotic heart disease of native coronary artery with unstable angina pectoris: Secondary | ICD-10-CM

## 2018-10-14 DIAGNOSIS — Z951 Presence of aortocoronary bypass graft: Secondary | ICD-10-CM

## 2018-10-14 DIAGNOSIS — I081 Rheumatic disorders of both mitral and tricuspid valves: Secondary | ICD-10-CM | POA: Diagnosis not present

## 2018-10-14 HISTORY — PX: TEE WITHOUT CARDIOVERSION: SHX5443

## 2018-10-14 HISTORY — PX: CORONARY ARTERY BYPASS GRAFT: SHX141

## 2018-10-14 LAB — BASIC METABOLIC PANEL
Anion gap: 9 (ref 5–15)
BUN: 8 mg/dL (ref 8–23)
CO2: 21 mmol/L — ABNORMAL LOW (ref 22–32)
Calcium: 7.5 mg/dL — ABNORMAL LOW (ref 8.9–10.3)
Chloride: 100 mmol/L (ref 98–111)
Creatinine, Ser: 0.59 mg/dL (ref 0.44–1.00)
GFR calc Af Amer: >60 mL/min (ref >60)
GFR calc non Af Amer: >60 mL/min (ref >60)
Glucose, Bld: 129 mg/dL — ABNORMAL HIGH (ref 70–99)
Potassium: 4.1 mmol/L (ref 3.5–5.1)
Sodium: 130 mmol/L — ABNORMAL LOW (ref 135–145)

## 2018-10-14 LAB — CBC
HCT: 34.9 % — ABNORMAL LOW (ref 36.0–46.0)
HCT: 26.1 % — ABNORMAL LOW (ref 36.0–46.0)
HCT: 28.8 % — ABNORMAL LOW (ref 36.0–46.0)
Hemoglobin: 10.6 g/dL — ABNORMAL LOW (ref 12.0–15.0)
Hemoglobin: 7.9 g/dL — ABNORMAL LOW (ref 12.0–15.0)
Hemoglobin: 8.8 g/dL — ABNORMAL LOW (ref 12.0–15.0)
MCH: 25 pg — ABNORMAL LOW (ref 26.0–34.0)
MCH: 25.1 pg — ABNORMAL LOW (ref 26.0–34.0)
MCH: 25.2 pg — ABNORMAL LOW (ref 26.0–34.0)
MCHC: 30.4 g/dL (ref 30.0–36.0)
MCHC: 30.3 g/dL (ref 30.0–36.0)
MCHC: 30.6 g/dL (ref 30.0–36.0)
MCV: 82.3 fL (ref 80.0–100.0)
MCV: 82.9 fL (ref 80.0–100.0)
MCV: 82.5 fL (ref 80.0–100.0)
Platelets: 336 10*3/uL (ref 150–400)
Platelets: 210 10*3/uL (ref 150–400)
Platelets: 229 10*3/uL (ref 150–400)
RBC: 4.24 MIL/uL (ref 3.87–5.11)
RBC: 3.15 MIL/uL — ABNORMAL LOW (ref 3.87–5.11)
RBC: 3.49 MIL/uL — ABNORMAL LOW (ref 3.87–5.11)
RDW: 15.7 % — ABNORMAL HIGH (ref 11.5–15.5)
RDW: 15.7 % — ABNORMAL HIGH (ref 11.5–15.5)
RDW: 15.8 % — ABNORMAL HIGH (ref 11.5–15.5)
WBC: 9.5 10*3/uL (ref 4.0–10.5)
WBC: 12.9 10*3/uL — ABNORMAL HIGH (ref 4.0–10.5)
WBC: 13 10*3/uL — ABNORMAL HIGH (ref 4.0–10.5)
nRBC: 0.2 % (ref 0.0–0.2)
nRBC: 0 % (ref 0.0–0.2)
nRBC: 0 % (ref 0.0–0.2)

## 2018-10-14 LAB — POCT I-STAT 7, (LYTES, BLD GAS, ICA,H+H)
Acid-Base Excess: 15 mmol/L — ABNORMAL HIGH (ref 0.0–2.0)
Acid-Base Excess: 3 mmol/L — ABNORMAL HIGH (ref 0.0–2.0)
Acid-base deficit: 2 mmol/L (ref 0.0–2.0)
Acid-base deficit: 2 mmol/L (ref 0.0–2.0)
Acid-base deficit: 2 mmol/L (ref 0.0–2.0)
Bicarbonate: 38.6 mmol/L — ABNORMAL HIGH (ref 20.0–28.0)
Bicarbonate: 26.9 mmol/L (ref 20.0–28.0)
Bicarbonate: 22.9 mmol/L (ref 20.0–28.0)
Bicarbonate: 22.5 mmol/L (ref 20.0–28.0)
Bicarbonate: 23.2 mmol/L (ref 20.0–28.0)
Calcium, Ion: 0.87 mmol/L — CL (ref 1.15–1.40)
Calcium, Ion: 1.05 mmol/L — ABNORMAL LOW (ref 1.15–1.40)
Calcium, Ion: 1.13 mmol/L — ABNORMAL LOW (ref 1.15–1.40)
Calcium, Ion: 1.12 mmol/L — ABNORMAL LOW (ref 1.15–1.40)
Calcium, Ion: 1.14 mmol/L — ABNORMAL LOW (ref 1.15–1.40)
HCT: 22 % — ABNORMAL LOW (ref 36.0–46.0)
HCT: 21 % — ABNORMAL LOW (ref 36.0–46.0)
HCT: 25 % — ABNORMAL LOW (ref 36.0–46.0)
HCT: 28 % — ABNORMAL LOW (ref 36.0–46.0)
HCT: 31 % — ABNORMAL LOW (ref 36.0–46.0)
Hemoglobin: 7.5 g/dL — ABNORMAL LOW (ref 12.0–15.0)
Hemoglobin: 7.1 g/dL — ABNORMAL LOW (ref 12.0–15.0)
Hemoglobin: 8.5 g/dL — ABNORMAL LOW (ref 12.0–15.0)
Hemoglobin: 9.5 g/dL — ABNORMAL LOW (ref 12.0–15.0)
Hemoglobin: 10.5 g/dL — ABNORMAL LOW (ref 12.0–15.0)
O2 Saturation: 100 %
O2 Saturation: 100 %
O2 Saturation: 96 %
O2 Saturation: 99 %
O2 Saturation: 99 %
Patient temperature: 36.4
Patient temperature: 36.6
Patient temperature: 36.9
Potassium: 3.7 mmol/L (ref 3.5–5.1)
Potassium: 3.8 mmol/L (ref 3.5–5.1)
Potassium: 3.5 mmol/L (ref 3.5–5.1)
Potassium: 4.5 mmol/L (ref 3.5–5.1)
Potassium: 4.5 mmol/L (ref 3.5–5.1)
Sodium: 145 mmol/L (ref 135–145)
Sodium: 135 mmol/L (ref 135–145)
Sodium: 141 mmol/L (ref 135–145)
Sodium: 138 mmol/L (ref 135–145)
Sodium: 137 mmol/L (ref 135–145)
TCO2: 40 mmol/L — ABNORMAL HIGH (ref 22–32)
TCO2: 28 mmol/L (ref 22–32)
TCO2: 24 mmol/L (ref 22–32)
TCO2: 24 mmol/L (ref 22–32)
TCO2: 24 mmol/L (ref 22–32)
pCO2 arterial: 40.2 mmHg (ref 32.0–48.0)
pCO2 arterial: 35.8 mmHg (ref 32.0–48.0)
pCO2 arterial: 40 mmHg (ref 32.0–48.0)
pCO2 arterial: 36.8 mmHg (ref 32.0–48.0)
pCO2 arterial: 41.2 mmHg (ref 32.0–48.0)
pH, Arterial: 7.59 — ABNORMAL HIGH (ref 7.350–7.450)
pH, Arterial: 7.484 — ABNORMAL HIGH (ref 7.350–7.450)
pH, Arterial: 7.364 (ref 7.350–7.450)
pH, Arterial: 7.393 (ref 7.350–7.450)
pH, Arterial: 7.359 (ref 7.350–7.450)
pO2, Arterial: 331 mmHg — ABNORMAL HIGH (ref 83.0–108.0)
pO2, Arterial: 328 mmHg — ABNORMAL HIGH (ref 83.0–108.0)
pO2, Arterial: 84 mmHg (ref 83.0–108.0)
pO2, Arterial: 124 mmHg — ABNORMAL HIGH (ref 83.0–108.0)
pO2, Arterial: 136 mmHg — ABNORMAL HIGH (ref 83.0–108.0)

## 2018-10-14 LAB — POCT I-STAT 4, (NA,K, GLUC, HGB,HCT)
Glucose, Bld: 120 mg/dL — ABNORMAL HIGH (ref 70–99)
Glucose, Bld: 133 mg/dL — ABNORMAL HIGH (ref 70–99)
Glucose, Bld: 123 mg/dL — ABNORMAL HIGH (ref 70–99)
Glucose, Bld: 152 mg/dL — ABNORMAL HIGH (ref 70–99)
Glucose, Bld: 125 mg/dL — ABNORMAL HIGH (ref 70–99)
HCT: 32 % — ABNORMAL LOW (ref 36.0–46.0)
HCT: 29 % — ABNORMAL LOW (ref 36.0–46.0)
HCT: 21 % — ABNORMAL LOW (ref 36.0–46.0)
HCT: 22 % — ABNORMAL LOW (ref 36.0–46.0)
HCT: 27 % — ABNORMAL LOW (ref 36.0–46.0)
Hemoglobin: 10.9 g/dL — ABNORMAL LOW (ref 12.0–15.0)
Hemoglobin: 9.9 g/dL — ABNORMAL LOW (ref 12.0–15.0)
Hemoglobin: 7.1 g/dL — ABNORMAL LOW (ref 12.0–15.0)
Hemoglobin: 7.5 g/dL — ABNORMAL LOW (ref 12.0–15.0)
Hemoglobin: 9.2 g/dL — ABNORMAL LOW (ref 12.0–15.0)
Potassium: 4 mmol/L (ref 3.5–5.1)
Potassium: 3.9 mmol/L (ref 3.5–5.1)
Potassium: 4.4 mmol/L (ref 3.5–5.1)
Potassium: 4.2 mmol/L (ref 3.5–5.1)
Potassium: 3.5 mmol/L (ref 3.5–5.1)
Sodium: 136 mmol/L (ref 135–145)
Sodium: 135 mmol/L (ref 135–145)
Sodium: 135 mmol/L (ref 135–145)
Sodium: 137 mmol/L (ref 135–145)
Sodium: 139 mmol/L (ref 135–145)

## 2018-10-14 LAB — APTT
aPTT: 40 seconds — ABNORMAL HIGH (ref 24–36)
aPTT: 31 seconds (ref 24–36)

## 2018-10-14 LAB — PROTIME-INR
INR: 1.1 (ref 0.8–1.2)
INR: 1.4 — ABNORMAL HIGH (ref 0.8–1.2)
Prothrombin Time: 13.7 seconds (ref 11.4–15.2)
Prothrombin Time: 17 seconds — ABNORMAL HIGH (ref 11.4–15.2)

## 2018-10-14 LAB — ECHO INTRAOPERATIVE TEE
Height: 64 in
Weight: 3192.26 oz

## 2018-10-14 LAB — COMPREHENSIVE METABOLIC PANEL
ALT: 14 U/L (ref 0–44)
AST: 15 U/L (ref 15–41)
Albumin: 3.2 g/dL — ABNORMAL LOW (ref 3.5–5.0)
Alkaline Phosphatase: 71 U/L (ref 38–126)
Anion gap: 10 (ref 5–15)
BUN: 11 mg/dL (ref 8–23)
CO2: 25 mmol/L (ref 22–32)
Calcium: 9.2 mg/dL (ref 8.9–10.3)
Chloride: 102 mmol/L (ref 98–111)
Creatinine, Ser: 0.76 mg/dL (ref 0.44–1.00)
GFR calc Af Amer: >60 mL/min (ref >60)
GFR calc non Af Amer: >60 mL/min (ref >60)
Glucose, Bld: 117 mg/dL — ABNORMAL HIGH (ref 70–99)
Potassium: 4 mmol/L (ref 3.5–5.1)
Sodium: 137 mmol/L (ref 135–145)
Total Bilirubin: 0.3 mg/dL (ref 0.3–1.2)
Total Protein: 6.3 g/dL — ABNORMAL LOW (ref 6.5–8.1)

## 2018-10-14 LAB — HEMOGLOBIN AND HEMATOCRIT, BLOOD
HCT: 21.4 % — ABNORMAL LOW (ref 36.0–46.0)
Hemoglobin: 6.5 g/dL — CL (ref 12.0–15.0)

## 2018-10-14 LAB — HEPARIN LEVEL (UNFRACTIONATED): Heparin Unfractionated: 0.18 IU/mL — ABNORMAL LOW (ref 0.30–0.70)

## 2018-10-14 LAB — MAGNESIUM: Magnesium: 3.1 mg/dL — ABNORMAL HIGH (ref 1.7–2.4)

## 2018-10-14 LAB — PLATELET COUNT: Platelets: 210 K/uL (ref 150–400)

## 2018-10-14 SURGERY — CORONARY ARTERY BYPASS GRAFTING (CABG)
Anesthesia: General | Site: Esophagus

## 2018-10-14 MED ORDER — METOPROLOL TARTRATE 25 MG/10 ML ORAL SUSPENSION: 12.5000 mg | Freq: Two times a day (BID) | ORAL | Status: DC

## 2018-10-14 MED ORDER — PROPOFOL 10 MG/ML IV BOLUS
INTRAVENOUS | Status: AC
  Filled 2018-10-14: qty 20

## 2018-10-14 MED ORDER — CHLORHEXIDINE GLUCONATE 0.12% ORAL RINSE (MEDLINE KIT)
15.0000 mL | Freq: Two times a day (BID) | OROMUCOSAL | Status: DC
  Administered 2018-10-14: 20:00:00 15 mL via OROMUCOSAL

## 2018-10-14 MED ORDER — ACETAMINOPHEN 160 MG/5ML PO SOLN: 650.0000 mg | Freq: Once | ORAL | Status: AC

## 2018-10-14 MED ORDER — HEMOSTATIC AGENTS (NO CHARGE) OPTIME
TOPICAL | Status: DC | PRN
  Administered 2018-10-14: 1 via TOPICAL

## 2018-10-14 MED ORDER — INSULIN REGULAR BOLUS VIA INFUSION
0.0000 [IU] | Freq: Three times a day (TID) | INTRAVENOUS | Status: DC
  Filled 2018-10-14: qty 10

## 2018-10-14 MED ORDER — ACETAMINOPHEN 650 MG RE SUPP
650.0000 mg | Freq: Once | RECTAL | Status: AC
  Administered 2018-10-14: 13:00:00 650 mg via RECTAL

## 2018-10-14 MED ORDER — ONDANSETRON HCL 4 MG/2ML IJ SOLN
4.0000 mg | Freq: Four times a day (QID) | INTRAMUSCULAR | Status: DC | PRN
  Administered 2018-10-15: 4 mg via INTRAVENOUS
  Filled 2018-10-14: qty 2

## 2018-10-14 MED ORDER — ACETAMINOPHEN 500 MG PO TABS
1000.0000 mg | ORAL_TABLET | Freq: Four times a day (QID) | ORAL | Status: DC
  Administered 2018-10-14 – 2018-10-16 (×8): 1000 mg via ORAL
  Filled 2018-10-14 (×8): qty 2

## 2018-10-14 MED ORDER — PROTAMINE SULFATE 10 MG/ML IV SOLN
INTRAVENOUS | Status: AC
  Filled 2018-10-14: qty 25

## 2018-10-14 MED ORDER — PROTAMINE SULFATE 10 MG/ML IV SOLN
INTRAVENOUS | Status: AC
  Filled 2018-10-14: qty 5

## 2018-10-14 MED ORDER — LACTATED RINGERS IV SOLN: 500.0000 mL | Freq: Once | INTRAVENOUS | Status: DC | PRN

## 2018-10-14 MED ORDER — ALBUMIN HUMAN 5 % IV SOLN
INTRAVENOUS | Status: DC | PRN
  Administered 2018-10-14 (×2): via INTRAVENOUS

## 2018-10-14 MED ORDER — FENTANYL CITRATE (PF) 250 MCG/5ML IJ SOLN
INTRAMUSCULAR | Status: DC | PRN
  Administered 2018-10-14 (×2): 150 ug via INTRAVENOUS
  Administered 2018-10-14 (×2): 100 ug via INTRAVENOUS
  Administered 2018-10-14: 450 ug via INTRAVENOUS
  Administered 2018-10-14: 50 ug via INTRAVENOUS
  Administered 2018-10-14: 150 ug via INTRAVENOUS
  Administered 2018-10-14: 50 ug via INTRAVENOUS
  Administered 2018-10-14 (×3): 100 ug via INTRAVENOUS

## 2018-10-14 MED ORDER — METOPROLOL TARTRATE 5 MG/5ML IV SOLN
2.5000 mg | INTRAVENOUS | Status: DC | PRN
  Administered 2018-10-14 – 2018-10-15 (×4): 2.5 mg via INTRAVENOUS
  Filled 2018-10-14: qty 5

## 2018-10-14 MED ORDER — PHENYLEPHRINE 40 MCG/ML (10ML) SYRINGE FOR IV PUSH (FOR BLOOD PRESSURE SUPPORT)
PREFILLED_SYRINGE | INTRAVENOUS | Status: AC
  Filled 2018-10-14: qty 10

## 2018-10-14 MED ORDER — MIDAZOLAM HCL 5 MG/5ML IJ SOLN
INTRAMUSCULAR | Status: DC | PRN
  Administered 2018-10-14: 2 mg via INTRAVENOUS
  Administered 2018-10-14: 3 mg via INTRAVENOUS
  Administered 2018-10-14: 1 mg via INTRAVENOUS
  Administered 2018-10-14 (×2): 2 mg via INTRAVENOUS

## 2018-10-14 MED ORDER — LACTATED RINGERS IV SOLN: INTRAVENOUS | Status: DC

## 2018-10-14 MED ORDER — SODIUM CHLORIDE 0.9% FLUSH
10.0000 mL | Freq: Two times a day (BID) | INTRAVENOUS | Status: DC
  Administered 2018-10-14 – 2018-10-16 (×4): 10 mL

## 2018-10-14 MED ORDER — PHENYLEPHRINE HCL-NACL 20-0.9 MG/250ML-% IV SOLN: 0.0000 ug/min | INTRAVENOUS | Status: DC

## 2018-10-14 MED ORDER — THROMBIN 20000 UNITS EX SOLR
CUTANEOUS | Status: DC | PRN
  Administered 2018-10-14: 08:00:00 20000 [IU] via TOPICAL

## 2018-10-14 MED ORDER — VANCOMYCIN HCL IN DEXTROSE 1-5 GM/200ML-% IV SOLN
1000.0000 mg | Freq: Once | INTRAVENOUS | Status: AC
  Administered 2018-10-14: 1000 mg via INTRAVENOUS
  Filled 2018-10-14: qty 200

## 2018-10-14 MED ORDER — TRAMADOL HCL 50 MG PO TABS
50.0000 mg | ORAL_TABLET | ORAL | Status: DC | PRN
  Administered 2018-10-15: 100 mg via ORAL
  Filled 2018-10-14: qty 1
  Filled 2018-10-14: qty 2

## 2018-10-14 MED ORDER — SODIUM CHLORIDE 0.9 % IV SOLN
INTRAVENOUS | Status: DC | PRN
  Administered 2018-10-14: 12:00:00 via INTRAVENOUS

## 2018-10-14 MED ORDER — ORAL CARE MOUTH RINSE
15.0000 mL | Freq: Two times a day (BID) | OROMUCOSAL | Status: DC
  Administered 2018-10-14 – 2018-10-20 (×7): 15 mL via OROMUCOSAL

## 2018-10-14 MED ORDER — LACTATED RINGERS IV SOLN
INTRAVENOUS | Status: DC | PRN
  Administered 2018-10-14 (×2): via INTRAVENOUS

## 2018-10-14 MED ORDER — SODIUM CHLORIDE 0.9 % IV SOLN
INTRAVENOUS | Status: DC | PRN
  Administered 2018-10-14: 08:00:00 15 ug/min via INTRAVENOUS

## 2018-10-14 MED ORDER — 0.9 % SODIUM CHLORIDE (POUR BTL) OPTIME
TOPICAL | Status: DC | PRN
  Administered 2018-10-14: 6000 mL

## 2018-10-14 MED ORDER — SODIUM CHLORIDE 0.9% FLUSH: 3.0000 mL | INTRAVENOUS | Status: DC | PRN

## 2018-10-14 MED ORDER — FENTANYL CITRATE (PF) 250 MCG/5ML IJ SOLN
INTRAMUSCULAR | Status: AC
  Filled 2018-10-14: qty 5

## 2018-10-14 MED ORDER — CLEVIDIPINE BUTYRATE 0.5 MG/ML IV EMUL
INTRAVENOUS | Status: DC | PRN
  Administered 2018-10-14: 2 mg/h via INTRAVENOUS

## 2018-10-14 MED ORDER — THROMBIN (RECOMBINANT) 20000 UNITS EX SOLR
CUTANEOUS | Status: AC
  Filled 2018-10-14: qty 20000

## 2018-10-14 MED ORDER — MORPHINE SULFATE (PF) 2 MG/ML IV SOLN
1.0000 mg | INTRAVENOUS | Status: DC | PRN
  Administered 2018-10-14 – 2018-10-15 (×5): 2 mg via INTRAVENOUS
  Filled 2018-10-14 (×5): qty 1

## 2018-10-14 MED ORDER — LACTATED RINGERS IV SOLN
INTRAVENOUS | Status: DC | PRN
  Administered 2018-10-14: 07:00:00 via INTRAVENOUS

## 2018-10-14 MED ORDER — BISACODYL 10 MG RE SUPP: 10.0000 mg | Freq: Every day | RECTAL | Status: DC

## 2018-10-14 MED ORDER — HEPARIN SODIUM (PORCINE) 1000 UNIT/ML IJ SOLN
INTRAMUSCULAR | Status: DC | PRN
  Administered 2018-10-14: 5000 [IU] via INTRAVENOUS
  Administered 2018-10-14: 30000 [IU] via INTRAVENOUS

## 2018-10-14 MED ORDER — PROTAMINE SULFATE 10 MG/ML IV SOLN
INTRAVENOUS | Status: DC | PRN
  Administered 2018-10-14: 300 mg via INTRAVENOUS

## 2018-10-14 MED ORDER — PANTOPRAZOLE SODIUM 40 MG PO TBEC
40.0000 mg | DELAYED_RELEASE_TABLET | Freq: Every day | ORAL | Status: DC
  Administered 2018-10-16: 40 mg via ORAL
  Filled 2018-10-14: qty 1

## 2018-10-14 MED ORDER — PHENYLEPHRINE 40 MCG/ML (10ML) SYRINGE FOR IV PUSH (FOR BLOOD PRESSURE SUPPORT)
PREFILLED_SYRINGE | INTRAVENOUS | Status: DC | PRN
  Administered 2018-10-14 (×2): 80 ug via INTRAVENOUS
  Administered 2018-10-14 (×6): 40 ug via INTRAVENOUS

## 2018-10-14 MED ORDER — ORAL CARE MOUTH RINSE
15.0000 mL | OROMUCOSAL | Status: DC
  Administered 2018-10-14 (×2): 15 mL via OROMUCOSAL

## 2018-10-14 MED ORDER — MIDAZOLAM HCL (PF) 10 MG/2ML IJ SOLN
INTRAMUSCULAR | Status: AC
  Filled 2018-10-14: qty 2

## 2018-10-14 MED ORDER — CHLORHEXIDINE GLUCONATE 0.12 % MT SOLN
15.0000 mL | OROMUCOSAL | Status: AC
  Administered 2018-10-14: 15 mL via OROMUCOSAL
  Filled 2018-10-14: qty 15

## 2018-10-14 MED ORDER — DEXMEDETOMIDINE HCL IN NACL 400 MCG/100ML IV SOLN
INTRAVENOUS | Status: DC | PRN
  Administered 2018-10-14: 0.7 ug/kg/h via INTRAVENOUS

## 2018-10-14 MED ORDER — THROMBIN 20000 UNITS EX SOLR
OROMUCOSAL | Status: DC | PRN
  Administered 2018-10-14: 4 mL via TOPICAL

## 2018-10-14 MED ORDER — ALBUMIN HUMAN 5 % IV SOLN
250.0000 mL | INTRAVENOUS | Status: DC | PRN
  Administered 2018-10-14: 01:00:00 12.5 g via INTRAVENOUS

## 2018-10-14 MED ORDER — SODIUM CHLORIDE 0.9 % IV SOLN
1.5000 g | Freq: Two times a day (BID) | INTRAVENOUS | Status: AC
  Administered 2018-10-14 – 2018-10-16 (×4): 1.5 g via INTRAVENOUS
  Filled 2018-10-14 (×4): qty 1.5

## 2018-10-14 MED ORDER — DOCUSATE SODIUM 100 MG PO CAPS
200.0000 mg | ORAL_CAPSULE | Freq: Every day | ORAL | Status: DC
  Administered 2018-10-15 – 2018-10-16 (×2): 200 mg via ORAL
  Filled 2018-10-14 (×2): qty 2

## 2018-10-14 MED ORDER — SODIUM CHLORIDE 0.9% FLUSH: 10.0000 mL | INTRAVENOUS | Status: DC | PRN

## 2018-10-14 MED ORDER — MIDAZOLAM HCL 2 MG/2ML IJ SOLN: 2.0000 mg | INTRAMUSCULAR | Status: DC | PRN

## 2018-10-14 MED ORDER — HEPARIN SODIUM (PORCINE) 1000 UNIT/ML IJ SOLN
INTRAMUSCULAR | Status: AC
  Filled 2018-10-14: qty 1

## 2018-10-14 MED ORDER — SODIUM CHLORIDE 0.9% FLUSH: 3.0000 mL | Freq: Two times a day (BID) | INTRAVENOUS | Status: DC

## 2018-10-14 MED ORDER — NITROGLYCERIN IN D5W 200-5 MCG/ML-% IV SOLN
0.0000 ug/min | INTRAVENOUS | Status: DC
  Administered 2018-10-15: 70 ug/min via INTRAVENOUS
  Filled 2018-10-14: qty 250

## 2018-10-14 MED ORDER — OXYCODONE HCL 5 MG PO TABS
5.0000 mg | ORAL_TABLET | ORAL | Status: DC | PRN
  Administered 2018-10-14 – 2018-10-15 (×2): 10 mg via ORAL
  Administered 2018-10-15: 5 mg via ORAL
  Administered 2018-10-15 – 2018-10-16 (×4): 10 mg via ORAL
  Filled 2018-10-14 (×6): qty 2
  Filled 2018-10-14: qty 1

## 2018-10-14 MED ORDER — MAGNESIUM SULFATE 4 GM/100ML IV SOLN
4.0000 g | Freq: Once | INTRAVENOUS | Status: AC
  Administered 2018-10-14: 14:00:00 4 g via INTRAVENOUS
  Filled 2018-10-14: qty 100

## 2018-10-14 MED ORDER — DEXMEDETOMIDINE HCL IN NACL 200 MCG/50ML IV SOLN
0.0000 ug/kg/h | INTRAVENOUS | Status: DC
  Administered 2018-10-14: 0.7 ug/kg/h via INTRAVENOUS

## 2018-10-14 MED ORDER — ROCURONIUM BROMIDE 10 MG/ML (PF) SYRINGE
PREFILLED_SYRINGE | INTRAVENOUS | Status: AC
  Filled 2018-10-14: qty 20

## 2018-10-14 MED ORDER — PROPOFOL 10 MG/ML IV BOLUS
INTRAVENOUS | Status: DC | PRN
  Administered 2018-10-14: 40 mg via INTRAVENOUS
  Administered 2018-10-14: 30 mg via INTRAVENOUS
  Administered 2018-10-14: 40 mg via INTRAVENOUS
  Administered 2018-10-14: 30 mg via INTRAVENOUS
  Administered 2018-10-14: 50 mg via INTRAVENOUS
  Administered 2018-10-14: 30 mg via INTRAVENOUS
  Administered 2018-10-14: 20 mg via INTRAVENOUS

## 2018-10-14 MED ORDER — SODIUM CHLORIDE 0.9 % IV SOLN: INTRAVENOUS | Status: DC

## 2018-10-14 MED ORDER — BISACODYL 5 MG PO TBEC
10.0000 mg | DELAYED_RELEASE_TABLET | Freq: Every day | ORAL | Status: DC
  Administered 2018-10-15 – 2018-10-16 (×2): 10 mg via ORAL
  Filled 2018-10-14 (×2): qty 2

## 2018-10-14 MED ORDER — POTASSIUM CHLORIDE 10 MEQ/50ML IV SOLN
10.0000 meq | INTRAVENOUS | Status: AC
  Administered 2018-10-14 (×3): 10 meq via INTRAVENOUS

## 2018-10-14 MED ORDER — ROCURONIUM BROMIDE 10 MG/ML (PF) SYRINGE
PREFILLED_SYRINGE | INTRAVENOUS | Status: DC | PRN
  Administered 2018-10-14: 50 mg via INTRAVENOUS
  Administered 2018-10-14: 30 mg via INTRAVENOUS
  Administered 2018-10-14: 100 mg via INTRAVENOUS
  Administered 2018-10-14: 50 mg via INTRAVENOUS

## 2018-10-14 MED ORDER — PLASMA-LYTE 148 IV SOLN
INTRAVENOUS | Status: DC | PRN
  Administered 2018-10-14: 08:00:00 500 mL via INTRAVASCULAR

## 2018-10-14 MED ORDER — SODIUM CHLORIDE 0.9 % IV SOLN: 250.0000 mL | INTRAVENOUS | Status: DC

## 2018-10-14 MED ORDER — ROCURONIUM BROMIDE 10 MG/ML (PF) SYRINGE
PREFILLED_SYRINGE | INTRAVENOUS | Status: AC
  Filled 2018-10-14: qty 10

## 2018-10-14 MED ORDER — CHLORHEXIDINE GLUCONATE CLOTH 2 % EX PADS
6.0000 | MEDICATED_PAD | Freq: Every day | CUTANEOUS | Status: DC
  Administered 2018-10-14 – 2018-10-15 (×2): 6 via TOPICAL

## 2018-10-14 MED ORDER — SODIUM CHLORIDE 0.45 % IV SOLN
INTRAVENOUS | Status: DC | PRN
  Administered 2018-10-14: 13:00:00 1 mL via INTRAVENOUS

## 2018-10-14 MED ORDER — METOPROLOL TARTRATE 12.5 MG HALF TABLET
12.5000 mg | ORAL_TABLET | Freq: Two times a day (BID) | ORAL | Status: DC
  Filled 2018-10-14: qty 1

## 2018-10-14 MED ORDER — ASPIRIN 81 MG PO CHEW: 324.0000 mg | CHEWABLE_TABLET | Freq: Every day | ORAL | Status: DC

## 2018-10-14 MED ORDER — ASPIRIN EC 325 MG PO TBEC
325.0000 mg | DELAYED_RELEASE_TABLET | Freq: Every day | ORAL | Status: DC
  Administered 2018-10-15 – 2018-10-16 (×2): 325 mg via ORAL
  Filled 2018-10-14 (×2): qty 1

## 2018-10-14 MED ORDER — INSULIN REGULAR(HUMAN) IN NACL 100-0.9 UT/100ML-% IV SOLN
INTRAVENOUS | Status: DC
  Administered 2018-10-14: 13:00:00 2 [IU]/h via INTRAVENOUS

## 2018-10-14 MED ORDER — NITROGLYCERIN 0.2 MG/ML ON CALL CATH LAB
INTRAVENOUS | Status: DC | PRN
  Administered 2018-10-14: 20 ug via INTRAVENOUS
  Administered 2018-10-14: 40 ug via INTRAVENOUS
  Administered 2018-10-14: 80 ug via INTRAVENOUS
  Administered 2018-10-14: 20 ug via INTRAVENOUS
  Administered 2018-10-14 (×5): 40 ug via INTRAVENOUS
  Administered 2018-10-14: 20 ug via INTRAVENOUS
  Administered 2018-10-14: 40 ug via INTRAVENOUS

## 2018-10-14 MED ORDER — FENTANYL CITRATE (PF) 250 MCG/5ML IJ SOLN
INTRAMUSCULAR | Status: AC
  Filled 2018-10-14: qty 25

## 2018-10-14 MED ORDER — ACETAMINOPHEN 160 MG/5ML PO SOLN: 1000.0000 mg | Freq: Four times a day (QID) | ORAL | Status: DC

## 2018-10-14 MED ORDER — SUCCINYLCHOLINE CHLORIDE 200 MG/10ML IV SOSY
PREFILLED_SYRINGE | INTRAVENOUS | Status: AC
  Filled 2018-10-14: qty 10

## 2018-10-14 MED ORDER — FAMOTIDINE IN NACL 20-0.9 MG/50ML-% IV SOLN
20.0000 mg | Freq: Two times a day (BID) | INTRAVENOUS | Status: AC
  Administered 2018-10-14 (×2): 20 mg via INTRAVENOUS
  Filled 2018-10-14: qty 50

## 2018-10-14 MED ORDER — SUCCINYLCHOLINE CHLORIDE 200 MG/10ML IV SOSY
PREFILLED_SYRINGE | INTRAVENOUS | Status: DC | PRN
  Administered 2018-10-14: 100 mg via INTRAVENOUS

## 2018-10-14 MED ORDER — ALPRAZOLAM 0.25 MG PO TABS
0.2500 mg | ORAL_TABLET | Freq: Every evening | ORAL | Status: DC | PRN
  Administered 2018-10-18 – 2018-10-19 (×2): 0.25 mg via ORAL
  Filled 2018-10-14 (×2): qty 1

## 2018-10-14 SURGICAL SUPPLY — 101 items
BAG DECANTER FOR FLEXI CONT (MISCELLANEOUS) ×4 IMPLANT
BASKET HEART  (ORDER IN 25'S) (MISCELLANEOUS) ×1
BASKET HEART (ORDER IN 25'S) (MISCELLANEOUS) ×1
BASKET HEART (ORDER IN 25S) (MISCELLANEOUS) ×2 IMPLANT
BLADE CLIPPER SURG (BLADE) ×4 IMPLANT
BLADE STERNUM SYSTEM 6 (BLADE) ×4 IMPLANT
BNDG ELASTIC 4X5.8 VLCR STR LF (GAUZE/BANDAGES/DRESSINGS) ×4 IMPLANT
BNDG ELASTIC 6X5.8 VLCR STR LF (GAUZE/BANDAGES/DRESSINGS) ×4 IMPLANT
BNDG GAUZE ELAST 4 BULKY (GAUZE/BANDAGES/DRESSINGS) ×4 IMPLANT
CANISTER SUCT 3000ML PPV (MISCELLANEOUS) ×4 IMPLANT
CATH ROBINSON RED A/P 18FR (CATHETERS) ×8 IMPLANT
CATH THORACIC 28FR (CATHETERS) ×4 IMPLANT
CATH THORACIC 36FR (CATHETERS) ×4 IMPLANT
CATH THORACIC 36FR RT ANG (CATHETERS) ×4 IMPLANT
CLIP VESOCCLUDE MED 24/CT (CLIP) IMPLANT
CLIP VESOCCLUDE SM WIDE 24/CT (CLIP) ×2 IMPLANT
COVER PROBE W GEL 5X96 (DRAPES) ×2 IMPLANT
COVER WAND RF STERILE (DRAPES) ×4 IMPLANT
DRAPE CARDIOVASCULAR INCISE (DRAPES) ×2
DRAPE SLUSH/WARMER DISC (DRAPES) ×4 IMPLANT
DRAPE SRG 135X102X78XABS (DRAPES) ×2 IMPLANT
DRSG COVADERM 4X14 (GAUZE/BANDAGES/DRESSINGS) ×4 IMPLANT
ELECT CAUTERY BLADE 6.4 (BLADE) ×4 IMPLANT
ELECT REM PT RETURN 9FT ADLT (ELECTROSURGICAL) ×8
ELECTRODE REM PT RTRN 9FT ADLT (ELECTROSURGICAL) ×4 IMPLANT
FELT TEFLON 1X6 (MISCELLANEOUS) ×8 IMPLANT
GAUZE SPONGE 4X4 12PLY STRL (GAUZE/BANDAGES/DRESSINGS) ×8 IMPLANT
GLOVE BIO SURGEON STRL SZ 6 (GLOVE) IMPLANT
GLOVE BIO SURGEON STRL SZ 6.5 (GLOVE) IMPLANT
GLOVE BIO SURGEON STRL SZ7 (GLOVE) IMPLANT
GLOVE BIO SURGEON STRL SZ7.5 (GLOVE) IMPLANT
GLOVE BIO SURGEONS STRL SZ 6.5 (GLOVE)
GLOVE BIOGEL PI IND STRL 6 (GLOVE) IMPLANT
GLOVE BIOGEL PI IND STRL 6.5 (GLOVE) IMPLANT
GLOVE BIOGEL PI IND STRL 7.0 (GLOVE) IMPLANT
GLOVE BIOGEL PI INDICATOR 6 (GLOVE)
GLOVE BIOGEL PI INDICATOR 6.5 (GLOVE)
GLOVE BIOGEL PI INDICATOR 7.0 (GLOVE)
GLOVE EUDERMIC 7 POWDERFREE (GLOVE) ×8 IMPLANT
GLOVE ORTHO TXT STRL SZ7.5 (GLOVE) IMPLANT
GOWN STRL REUS W/ TWL LRG LVL3 (GOWN DISPOSABLE) ×8 IMPLANT
GOWN STRL REUS W/ TWL XL LVL3 (GOWN DISPOSABLE) ×2 IMPLANT
GOWN STRL REUS W/TWL LRG LVL3 (GOWN DISPOSABLE) ×12
GOWN STRL REUS W/TWL XL LVL3 (GOWN DISPOSABLE) ×2
HEMOSTAT POWDER SURGIFOAM 1G (HEMOSTASIS) ×12 IMPLANT
HEMOSTAT SURGICEL 2X14 (HEMOSTASIS) ×4 IMPLANT
INSERT FOGARTY 61MM (MISCELLANEOUS) IMPLANT
INSERT FOGARTY XLG (MISCELLANEOUS) IMPLANT
KIT BASIN OR (CUSTOM PROCEDURE TRAY) ×4 IMPLANT
KIT CATH CPB BARTLE (MISCELLANEOUS) ×4 IMPLANT
KIT SUCTION CATH 14FR (SUCTIONS) ×4 IMPLANT
KIT TURNOVER KIT B (KITS) ×4 IMPLANT
KIT VASOVIEW HEMOPRO 2 VH 4000 (KITS) ×4 IMPLANT
NS IRRIG 1000ML POUR BTL (IV SOLUTION) ×20 IMPLANT
PACK E OPEN HEART (SUTURE) ×4 IMPLANT
PACK OPEN HEART (CUSTOM PROCEDURE TRAY) ×4 IMPLANT
PAD ARMBOARD 7.5X6 YLW CONV (MISCELLANEOUS) ×8 IMPLANT
PAD ELECT DEFIB RADIOL ZOLL (MISCELLANEOUS) ×4 IMPLANT
PENCIL BUTTON HOLSTER BLD 10FT (ELECTRODE) ×4 IMPLANT
POSITIONER HEAD DONUT 9IN (MISCELLANEOUS) ×4 IMPLANT
PUNCH AORTIC ROTATE 4.0MM (MISCELLANEOUS) IMPLANT
PUNCH AORTIC ROTATE 4.5MM 8IN (MISCELLANEOUS) ×4 IMPLANT
PUNCH AORTIC ROTATE 5MM 8IN (MISCELLANEOUS) IMPLANT
SET CARDIOPLEGIA MPS 5001102 (MISCELLANEOUS) ×2 IMPLANT
SPONGE INTESTINAL PEANUT (DISPOSABLE) IMPLANT
SPONGE LAP 18X18 RF (DISPOSABLE) IMPLANT
SPONGE LAP 4X18 RFD (DISPOSABLE) ×4 IMPLANT
SUPPORT HEART JANKE-BARRON (MISCELLANEOUS) ×2 IMPLANT
SUT BONE WAX W31G (SUTURE) ×4 IMPLANT
SUT MNCRL AB 4-0 PS2 18 (SUTURE) ×2 IMPLANT
SUT PROLENE 3 0 SH DA (SUTURE) IMPLANT
SUT PROLENE 3 0 SH1 36 (SUTURE) ×4 IMPLANT
SUT PROLENE 4 0 RB 1 (SUTURE) ×2
SUT PROLENE 4 0 SH DA (SUTURE) IMPLANT
SUT PROLENE 4-0 RB1 .5 CRCL 36 (SUTURE) IMPLANT
SUT PROLENE 5 0 C 1 36 (SUTURE) IMPLANT
SUT PROLENE 6 0 C 1 30 (SUTURE) IMPLANT
SUT PROLENE 7 0 BV 1 (SUTURE) IMPLANT
SUT PROLENE 7 0 BV1 MDA (SUTURE) ×4 IMPLANT
SUT PROLENE 8 0 BV175 6 (SUTURE) IMPLANT
SUT SILK  1 MH (SUTURE)
SUT SILK 1 MH (SUTURE) IMPLANT
SUT SILK 2 0 SH (SUTURE) IMPLANT
SUT STEEL STERNAL CCS#1 18IN (SUTURE) IMPLANT
SUT STEEL SZ 6 DBL 3X14 BALL (SUTURE) IMPLANT
SUT VIC AB 1 CTX 36 (SUTURE) ×4
SUT VIC AB 1 CTX36XBRD ANBCTR (SUTURE) ×4 IMPLANT
SUT VIC AB 2-0 CT1 27 (SUTURE) ×2
SUT VIC AB 2-0 CT1 TAPERPNT 27 (SUTURE) IMPLANT
SUT VIC AB 2-0 CTX 27 (SUTURE) IMPLANT
SUT VIC AB 3-0 SH 27 (SUTURE)
SUT VIC AB 3-0 SH 27X BRD (SUTURE) IMPLANT
SUT VIC AB 3-0 X1 27 (SUTURE) IMPLANT
SUT VICRYL 4-0 PS2 18IN ABS (SUTURE) IMPLANT
SYSTEM SAHARA CHEST DRAIN ATS (WOUND CARE) ×4 IMPLANT
TOWEL GREEN STERILE (TOWEL DISPOSABLE) ×4 IMPLANT
TOWEL GREEN STERILE FF (TOWEL DISPOSABLE) ×4 IMPLANT
TRAY FOLEY SLVR 16FR TEMP STAT (SET/KITS/TRAYS/PACK) ×4 IMPLANT
TUBING LAP HI FLOW INSUFFLATIO (TUBING) ×4 IMPLANT
UNDERPAD 30X30 (UNDERPADS AND DIAPERS) ×4 IMPLANT
WATER STERILE IRR 1000ML POUR (IV SOLUTION) ×8 IMPLANT

## 2018-10-14 NOTE — Plan of Care (Signed)

## 2018-10-14 NOTE — Brief Op Note (Signed)
10/10/2018 - 10/14/2018  8:18 AM  PATIENT:  Connie Owens  73 y.o. female  PRE-OPERATIVE DIAGNOSIS:  CAD  POST-OPERATIVE DIAGNOSIS:  * No post-op diagnosis entered *  PROCEDURE:  Procedure(s): CORONARY ARTERY BYPASS GRAFTING (CABG) x3. Left internal mammary take down. Left endoscopic saphenous vein harvest. (N/A) TRANSESOPHAGEAL ECHOCARDIOGRAM (TEE) (N/A)   LIMA to LAD SVG to OM1 SVG to RCA  SURGEON:  Surgeon(s) and Role:    * Bartle, Fernande Boyden, MD - Primary  PHYSICIAN ASSISTANT:  Nicholes Rough, PA-C   ANESTHESIA:   general  EBL:  800 mL   BLOOD ADMINISTERED:none  DRAINS: ROUTINE   LOCAL MEDICATIONS USED:  NONE  SPECIMEN:  No Specimen  DISPOSITION OF SPECIMEN:  N/A  COUNTS:  YES  TOURNIQUET:  * No tourniquets in log *  DICTATION: .Dragon Dictation  PLAN OF CARE: Admit to inpatient   PATIENT DISPOSITION:  ICU - intubated and hemodynamically stable.   Delay start of Pharmacological VTE agent (>24hrs) due to surgical blood loss or risk of bleeding: yes

## 2018-10-14 NOTE — Anesthesia Procedure Notes (Signed)
Central Venous Catheter Insertion Performed by: Catalina Gravel, MD, anesthesiologist Start/End8/31/2020 6:33 AM, 10/14/2018 6:43 AM Patient location: Pre-op. Preanesthetic checklist: patient identified, IV checked, site marked, risks and benefits discussed, surgical consent, monitors and equipment checked, pre-op evaluation, timeout performed and anesthesia consent Position: Trendelenburg Lidocaine 1% used for infiltration and patient sedated Hand hygiene performed , maximum sterile barriers used  and Seldinger technique used Catheter size: 9 Fr Central line was placed.MAC introducer Procedure performed using ultrasound guided technique. Ultrasound Notes:anatomy identified, needle tip was noted to be adjacent to the nerve/plexus identified, no ultrasound evidence of intravascular and/or intraneural injection and image(s) printed for medical record Attempts: 1 Following insertion, line sutured, dressing applied and Biopatch. Post procedure assessment: free fluid flow, blood return through all ports and no air  Patient tolerated the procedure well with no immediate complications.

## 2018-10-14 NOTE — Anesthesia Procedure Notes (Signed)
Procedure Name: Intubation Date/Time: 10/14/2018 7:46 AM Performed by: Colin Benton, CRNA Pre-anesthesia Checklist: Patient identified, Emergency Drugs available, Suction available and Patient being monitored Patient Re-evaluated:Patient Re-evaluated prior to induction Oxygen Delivery Method: Circle system utilized Preoxygenation: Pre-oxygenation with 100% oxygen Induction Type: IV induction and Rapid sequence Laryngoscope Size: Miller and 2 Grade View: Grade I Tube type: Oral Tube size: 7.0 mm Number of attempts: 1 Airway Equipment and Method: Stylet Placement Confirmation: ETT inserted through vocal cords under direct vision,  positive ETCO2 and breath sounds checked- equal and bilateral Secured at: 21 cm Tube secured with: Tape Dental Injury: Teeth and Oropharynx as per pre-operative assessment

## 2018-10-14 NOTE — Procedures (Addendum)
Extubation Procedure Note  Patient Details:   Name: Connie Owens DOB: 16-Apr-1945 MRN: [id number]   Airway Documentation:    Vent end date: 10/14/18 Vent end time: 1640   Evaluation  O2 sats: stable throughout Complications: No apparent complications Patient did tolerate procedure well. Bilateral Breath Sounds: Clear   Yes   Pt extubated per rapid wean protocol. NIF 25, VC 750 prior to extubation. Pt able to follow commands. Cuff leak heard before ETT removed. 2 RNs and an RT at bedside. No apparent complications at this time. Pt was placed on 5L humidified [place] with SATs in high 90s. Clear, diminished bilateral breath sounds. IS of 550 after extubation.  Esperanza Sheets T 10/14/2018, 4:50 PM

## 2018-10-14 NOTE — OR Nursing (Signed)
2nd call made to [place], RN in [facility].   Lattie Corns, RN

## 2018-10-14 NOTE — Op Note (Signed)
CARDIOVASCULAR SURGERY OPERATIVE NOTE  10/14/2018  Surgeon:  Gaye Pollack, MD  First Assistant: Nicholes Rough,  PA-C   Preoperative Diagnosis:  Severe multi-vessel coronary artery disease   Postoperative Diagnosis:  Same   Procedure:  1. Median Sternotomy 2. Extracorporeal circulation 3.   Coronary artery bypass grafting x 3   Left internal mammary graft to the LAD  SVG to OM 1  SVG to RCA  4.   Endoscopic vein harvest from the left leg   Anesthesia:  General Endotracheal   Clinical History/Surgical Indication:  This 73 year old woman has high-grade left main and severe multivessel coronary disease with unstable anginal symptoms.  I agree that coronary bypass graft surgery is indicated to prevent further ischemia and infarction and improve her symptoms.  She was on Plavix which was discontinued 3 days preop. P2Y12 was 278 prior to discontinuing Plavix. I discussed the operative procedure with the patient and her daughter including alternatives, benefits and risks; including but not limited to bleeding, blood transfusion, infection, stroke, myocardial infarction, graft failure, heart block requiring a permanent pacemaker, organ dysfunction, and death.  Ernie Avena understands and agrees to proceed.   Preparation:  The patient was seen in the preoperative holding area and the correct patient, correct operation were confirmed with the patient after reviewing the medical record and catheterization. The consent was signed by me. Preoperative antibiotics were given. A pulmonary arterial line and radial arterial line were placed by the anesthesia team. The patient was taken back to the operating room and positioned supine on the operating room table. After being placed under general endotracheal anesthesia by the anesthesia team a foley catheter was placed. The neck, chest, abdomen, and  both legs were prepped with betadine soap and solution and draped in the usual sterile manner. A surgical time-out was taken and the correct patient and operative procedure were confirmed with the nursing and anesthesia staff.   Cardiopulmonary Bypass:  A median sternotomy was performed. The pericardium was opened in the midline. Right ventricular function appeared normal. The ascending aorta was of normal size and had no palpable plaque. There were no contraindications to aortic cannulation or cross-clamping. The patient was fully systemically heparinized and the ACT was maintained > 400 sec. The proximal aortic arch was cannulated with a 20 F aortic cannula for arterial inflow. Venous cannulation was performed via the right atrial appendage using a two-staged venous cannula. An antegrade cardioplegia/vent cannula was inserted into the mid-ascending aorta. Aortic occlusion was performed with a single cross-clamp. Systemic cooling to 32 degrees Centigrade and topical cooling of the heart with iced saline were used. Hyperkalemic antegrade cold blood cardioplegia was used to induce diastolic arrest and was then given at about 20 minute intervals throughout the period of arrest to maintain myocardial temperature at or below 10 degrees centigrade. A temperature probe was inserted into the interventricular septum and an insulating pad was placed in the pericardium.   Left internal mammary harvest:  The left side of the sternum was retracted using the Rultract retractor. The left internal mammary artery was harvested as a pedicle graft. All side branches were clipped. It was a medium-sized vessel of good quality with excellent blood flow. It was ligated distally and divided. It was sprayed with topical papaverine solution to prevent vasospasm.   Endoscopic vein harvest:  The left greater saphenous vein was harvested endoscopically through a 2 cm incision medial to the left knee. It was harvested from the  upper thigh to below  the knee. It was a medium-sized vein of good quality. The side branches were all ligated with 4-0 silk ties.    Coronary arteries:  The coronary arteries were examined.   LAD:  Moderate sized vessel with no distal disease. Diagonal was small and not suitable for grafting.  LCX:  Large OM with no distal disease  RCA:  Distal RCA was large with no disease.   Grafts:  1. LIMA to the LAD: 1.75 mm. It was sewn end to side using 8-0 prolene continuous suture. 2. SVG to OM1:  2.0 mm. It was sewn end to side using 7-0 prolene continuous suture. 3. SVG to distal RCA:  3.0 mm. It was sewn end to side using 7-0 prolene continuous suture.   The proximal vein graft anastomoses were performed to the mid-ascending aorta using continuous 6-0 prolene suture. Graft markers were placed around the proximal anastomoses.   Completion:  The patient was rewarmed to 37 degrees Centigrade. The clamp was removed from the LIMA pedicle and there was rapid warming of the septum and return of ventricular fibrillation. The crossclamp was removed with a time of 66 minutes. There was spontaneous return of sinus rhythm. The distal and proximal anastomoses were checked for hemostasis. The position of the grafts was satisfactory. Two temporary epicardial pacing wires were placed on the right atrium and two on the right ventricle. The patient was weaned from CPB without difficulty on no inotropes. CPB time was 82 minutes. Cardiac output was 5 LPM. TEE showed normal LV function, no MR.Heparin was fully reversed with protamine and the aortic and venous cannulas removed. Hemostasis was achieved. Mediastinal and left pleural drainage tubes were placed. The sternum was closed with double #6 stainless steel wires. The fascia was closed with continuous # 1 vicryl suture. The subcutaneous tissue was closed with 2-0 vicryl continuous suture. The skin was closed with 3-0 vicryl subcuticular suture. All sponge,  needle, and instrument counts were reported correct at the end of the case. Dry sterile dressings were placed over the incisions and around the chest tubes which were connected to pleurevac suction. The patient was then transported to the surgical intensive care unit in stable condition.

## 2018-10-14 NOTE — Transfer of Care (Signed)
Immediate Anesthesia Transfer of Care Note  Patient: Connie Owens  Procedure(s) Performed: CORONARY ARTERY BYPASS GRAFTING (CABG) x3. Left internal mammary take down. Left endoscopic saphenous vein harvest. (N/A Chest) TRANSESOPHAGEAL ECHOCARDIOGRAM (TEE) (N/A Esophagus)  Patient Location: ICU  Anesthesia Type:General  Level of Consciousness: sedated and Patient remains intubated per anesthesia plan  Airway & Oxygen Therapy: Patient remains intubated per anesthesia plan and Patient placed on Ventilator (see vital sign flow sheet for setting)  Post-op Assessment: Report given to RN and Post -op Vital signs reviewed and stable  Post vital signs: Reviewed and stable  Last Vitals:  Vitals Value Taken Time  BP    Temp    Pulse    Resp    SpO2      Last Pain:  Vitals:   10/14/18 0400  TempSrc: Oral  PainSc:       Patients Stated Pain Goal: 0 ([id number])  Complications: No apparent anesthesia complications

## 2018-10-14 NOTE — Progress Notes (Signed)
  Echocardiogram Echocardiogram Transesophageal has been performed.  Connie Owens 10/14/2018, 8:27 AM

## 2018-10-14 NOTE — Progress Notes (Signed)
TCTS Evening Rounds POD #0 from CABG x 3 Extubated Alert/oriented Stable vitals Adequate UOP  Continue present management Connie Owens Seen, Kay

## 2018-10-14 NOTE — Anesthesia Postprocedure Evaluation (Signed)
Anesthesia Post Note  Patient: Connie Owens  Procedure(s) Performed: CORONARY ARTERY BYPASS GRAFTING (CABG) x3. Left internal mammary take down. Left endoscopic saphenous vein harvest. (N/A Chest) TRANSESOPHAGEAL ECHOCARDIOGRAM (TEE) (N/A Esophagus)     Patient location during evaluation: SICU Anesthesia Type: General Level of consciousness: sedated Pain management: pain level controlled Vital Signs Assessment: post-procedure vital signs reviewed and stable Respiratory status: patient remains intubated per anesthesia plan Cardiovascular status: stable Postop Assessment: no apparent nausea or vomiting Anesthetic complications: no    Last Vitals:  Vitals:   10/14/18 0600 10/14/18 1246  BP: 127/62   Pulse: (!) 105   Resp: (!) 26   Temp:    SpO2: 92% 98%    Last Pain:  Vitals:   10/14/18 0400  TempSrc: Oral  PainSc:                  Nataleah Scioneaux,W. EDMOND

## 2018-10-14 NOTE — Progress Notes (Signed)
Patient ID: Connie Owens, female   DOB: 1945/05/30, 73 y.o.   MRN: [id number] TCTS  She had a stable weekend with no chest pain or shortness of breath. Ready for CABG this am. She has no further questions.

## 2018-10-14 NOTE — OR Nursing (Signed)
1st call made to Junie Panning, RN on [facility].   Leatha Gilding, RN

## 2018-10-14 NOTE — Anesthesia Procedure Notes (Signed)
Central Venous Catheter Insertion Performed by: Catalina Gravel, MD, anesthesiologist Start/End8/31/2020 6:43 AM, 10/14/2018 6:48 AM Patient location: Pre-op. Preanesthetic checklist: patient identified, IV checked, site marked, risks and benefits discussed, surgical consent, monitors and equipment checked, pre-op evaluation and timeout performed Position: Trendelenburg Hand hygiene performed  and maximum sterile barriers used  Total catheter length 100. PA cath was placed.Swan type:thermodilution PA Cath depth:48 Procedure performed without using ultrasound guided technique. Attempts: 1 Patient tolerated the procedure well with no immediate complications.

## 2018-10-14 NOTE — Progress Notes (Signed)
Patient interviewed in preop. Patient alert and oriented. Patient able to confirm name, DOB, procedure, allergies, metal in right knee, npo status and no pain at this time.  Leatha Gilding, RN

## 2018-10-14 NOTE — OR Nursing (Signed)
Rolling call made to [place], RN on [facility].   Leatha Gilding, RN

## 2018-10-14 NOTE — Anesthesia Procedure Notes (Signed)
Arterial Line Insertion Start/End8/31/2020 6:50 AM, 10/14/2018 7:00 AM Performed by: Colin Benton, CRNA, CRNA  Patient location: Pre-op. Preanesthetic checklist: patient identified, IV checked, site marked, risks and benefits discussed, surgical consent, monitors and equipment checked, pre-op evaluation, timeout performed and anesthesia consent Lidocaine 1% used for infiltration and patient sedated Left, radial was placed Catheter size: 20 G Hand hygiene performed , maximum sterile barriers used  and Seldinger technique used Allen's test indicative of satisfactory collateral circulation Attempts: 1 Procedure performed without using ultrasound guided technique. Following insertion, dressing applied and Biopatch. Post procedure assessment: normal and unchanged  Patient tolerated the procedure well with no immediate complications.

## 2018-10-15 ENCOUNTER — Encounter (HOSPITAL_COMMUNITY): Payer: Self-pay | Admitting: Surgery

## 2018-10-15 ENCOUNTER — Inpatient Hospital Stay (HOSPITAL_COMMUNITY): Payer: Medicare Other

## 2018-10-15 LAB — BASIC METABOLIC PANEL
Anion gap: 7 (ref 5–15)
Anion gap: 9 (ref 5–15)
BUN: 5 mg/dL — ABNORMAL LOW (ref 8–23)
BUN: 9 mg/dL (ref 8–23)
CO2: 23 mmol/L (ref 22–32)
CO2: 23 mmol/L (ref 22–32)
Calcium: 7.8 mg/dL — ABNORMAL LOW (ref 8.9–10.3)
Calcium: 8.1 mg/dL — ABNORMAL LOW (ref 8.9–10.3)
Chloride: 103 mmol/L (ref 98–111)
Chloride: 100 mmol/L (ref 98–111)
Creatinine, Ser: 0.58 mg/dL (ref 0.44–1.00)
Creatinine, Ser: 0.79 mg/dL (ref 0.44–1.00)
GFR calc Af Amer: >60 mL/min (ref >60)
GFR calc Af Amer: >60 mL/min (ref >60)
GFR calc non Af Amer: >60 mL/min (ref >60)
GFR calc non Af Amer: >60 mL/min (ref >60)
Glucose, Bld: 117 mg/dL — ABNORMAL HIGH (ref 70–99)
Glucose, Bld: 163 mg/dL — ABNORMAL HIGH (ref 70–99)
Potassium: 3.9 mmol/L (ref 3.5–5.1)
Potassium: 4 mmol/L (ref 3.5–5.1)
Sodium: 133 mmol/L — ABNORMAL LOW (ref 135–145)
Sodium: 132 mmol/L — ABNORMAL LOW (ref 135–145)

## 2018-10-15 LAB — CBC
HCT: 26.9 % — ABNORMAL LOW (ref 36.0–46.0)
HCT: 28.4 % — ABNORMAL LOW (ref 36.0–46.0)
HCT: 29.2 % — ABNORMAL LOW (ref 36.0–46.0)
Hemoglobin: 8.2 g/dL — ABNORMAL LOW (ref 12.0–15.0)
Hemoglobin: 8.6 g/dL — ABNORMAL LOW (ref 12.0–15.0)
Hemoglobin: 8.7 g/dL — ABNORMAL LOW (ref 12.0–15.0)
MCH: 25.1 pg — ABNORMAL LOW (ref 26.0–34.0)
MCH: 24.9 pg — ABNORMAL LOW (ref 26.0–34.0)
MCH: 24.9 pg — ABNORMAL LOW (ref 26.0–34.0)
MCHC: 30.5 g/dL (ref 30.0–36.0)
MCHC: 30.3 g/dL (ref 30.0–36.0)
MCHC: 29.8 g/dL — ABNORMAL LOW (ref 30.0–36.0)
MCV: 82.3 fL (ref 80.0–100.0)
MCV: 82.3 fL (ref 80.0–100.0)
MCV: 83.7 fL (ref 80.0–100.0)
Platelets: 234 10*3/uL (ref 150–400)
Platelets: 237 10*3/uL (ref 150–400)
Platelets: 227 10*3/uL (ref 150–400)
RBC: 3.27 MIL/uL — ABNORMAL LOW (ref 3.87–5.11)
RBC: 3.45 MIL/uL — ABNORMAL LOW (ref 3.87–5.11)
RBC: 3.49 MIL/uL — ABNORMAL LOW (ref 3.87–5.11)
RDW: 15.6 % — ABNORMAL HIGH (ref 11.5–15.5)
RDW: 15.9 % — ABNORMAL HIGH (ref 11.5–15.5)
RDW: 15.9 % — ABNORMAL HIGH (ref 11.5–15.5)
WBC: 14.2 10*3/uL — ABNORMAL HIGH (ref 4.0–10.5)
WBC: 12.9 10*3/uL — ABNORMAL HIGH (ref 4.0–10.5)
WBC: 10.6 10*3/uL — ABNORMAL HIGH (ref 4.0–10.5)
nRBC: 0.1 % (ref 0.0–0.2)
nRBC: 0.2 % (ref 0.0–0.2)
nRBC: 0.3 % — ABNORMAL HIGH (ref 0.0–0.2)

## 2018-10-15 LAB — GLUCOSE, CAPILLARY
Glucose-Capillary: 90 mg/dL (ref 70–99)
Glucose-Capillary: 112 mg/dL — ABNORMAL HIGH (ref 70–99)
Glucose-Capillary: 114 mg/dL — ABNORMAL HIGH (ref 70–99)
Glucose-Capillary: 117 mg/dL — ABNORMAL HIGH (ref 70–99)
Glucose-Capillary: 118 mg/dL — ABNORMAL HIGH (ref 70–99)
Glucose-Capillary: 119 mg/dL — ABNORMAL HIGH (ref 70–99)
Glucose-Capillary: 120 mg/dL — ABNORMAL HIGH (ref 70–99)
Glucose-Capillary: 121 mg/dL — ABNORMAL HIGH (ref 70–99)
Glucose-Capillary: 123 mg/dL — ABNORMAL HIGH (ref 70–99)
Glucose-Capillary: 126 mg/dL — ABNORMAL HIGH (ref 70–99)
Glucose-Capillary: 128 mg/dL — ABNORMAL HIGH (ref 70–99)
Glucose-Capillary: 129 mg/dL — ABNORMAL HIGH (ref 70–99)
Glucose-Capillary: 131 mg/dL — ABNORMAL HIGH (ref 70–99)
Glucose-Capillary: 134 mg/dL — ABNORMAL HIGH (ref 70–99)
Glucose-Capillary: 138 mg/dL — ABNORMAL HIGH (ref 70–99)
Glucose-Capillary: 151 mg/dL — ABNORMAL HIGH (ref 70–99)
Glucose-Capillary: 89 mg/dL (ref 70–99)
Glucose-Capillary: 149 mg/dL — ABNORMAL HIGH (ref 70–99)
Glucose-Capillary: 149 mg/dL — ABNORMAL HIGH (ref 70–99)
Glucose-Capillary: 113 mg/dL — ABNORMAL HIGH (ref 70–99)
Glucose-Capillary: 126 mg/dL — ABNORMAL HIGH (ref 70–99)
Glucose-Capillary: 89 mg/dL (ref 70–99)
Glucose-Capillary: 138 mg/dL — ABNORMAL HIGH (ref 70–99)
Glucose-Capillary: 118 mg/dL — ABNORMAL HIGH (ref 70–99)

## 2018-10-15 LAB — CREATININE, SERUM
Creatinine, Ser: 0.64 mg/dL (ref 0.44–1.00)
GFR calc Af Amer: >60 mL/min (ref >60)
GFR calc non Af Amer: >60 mL/min (ref >60)

## 2018-10-15 LAB — MAGNESIUM
Magnesium: 2.4 mg/dL (ref 1.7–2.4)
Magnesium: 2.2 mg/dL (ref 1.7–2.4)

## 2018-10-15 MED ORDER — FUROSEMIDE 10 MG/ML IJ SOLN
40.0000 mg | Freq: Two times a day (BID) | INTRAMUSCULAR | Status: AC
  Administered 2018-10-15 (×2): 40 mg via INTRAVENOUS
  Filled 2018-10-15 (×2): qty 4

## 2018-10-15 MED ORDER — BRIMONIDINE TARTRATE 0.2 % OP SOLN
1.0000 [drp] | Freq: Two times a day (BID) | OPHTHALMIC | Status: DC
  Administered 2018-10-15: 1 [drp] via OPHTHALMIC
  Filled 2018-10-15: qty 5

## 2018-10-15 MED ORDER — BRIMONIDINE TARTRATE-TIMOLOL 0.2-0.5 % OP SOLN: 1.0000 [drp] | Freq: Two times a day (BID) | OPHTHALMIC | Status: DC

## 2018-10-15 MED ORDER — NORTRIPTYLINE HCL 25 MG PO CAPS
50.0000 mg | ORAL_CAPSULE | Freq: Every day | ORAL | Status: DC
  Administered 2018-10-15 – 2018-10-19 (×5): 50 mg via ORAL
  Filled 2018-10-15 (×7): qty 2

## 2018-10-15 MED ORDER — INSULIN ASPART 100 UNIT/ML ~~LOC~~ SOLN
0.0000 [IU] | SUBCUTANEOUS | Status: DC
  Administered 2018-10-16: 2 [IU] via SUBCUTANEOUS

## 2018-10-15 MED ORDER — INSULIN DETEMIR 100 UNIT/ML ~~LOC~~ SOLN
10.0000 [IU] | Freq: Every day | SUBCUTANEOUS | Status: DC
  Administered 2018-10-16: 10 [IU] via SUBCUTANEOUS
  Filled 2018-10-15 (×2): qty 0.1

## 2018-10-15 MED ORDER — BRIMONIDINE TARTRATE-TIMOLOL 0.2-0.5 % OP SOLN
1.0000 [drp] | Freq: Two times a day (BID) | OPHTHALMIC | Status: DC
  Administered 2018-10-15 – 2018-10-20 (×10): 1 [drp] via OPHTHALMIC
  Filled 2018-10-15: qty 5

## 2018-10-15 MED ORDER — ENOXAPARIN SODIUM 40 MG/0.4ML ~~LOC~~ SOLN
40.0000 mg | Freq: Every day | SUBCUTANEOUS | Status: DC
  Administered 2018-10-15 – 2018-10-19 (×5): 40 mg via SUBCUTANEOUS
  Filled 2018-10-15 (×5): qty 0.4

## 2018-10-15 MED ORDER — POTASSIUM CHLORIDE CRYS ER 20 MEQ PO TBCR
20.0000 meq | EXTENDED_RELEASE_TABLET | Freq: Two times a day (BID) | ORAL | Status: AC
  Administered 2018-10-15 (×2): 20 meq via ORAL
  Filled 2018-10-15 (×2): qty 1

## 2018-10-15 MED ORDER — PREDNISOLONE ACETATE 1 % OP SUSP
1.0000 [drp] | Freq: Two times a day (BID) | OPHTHALMIC | Status: DC
  Administered 2018-10-15 – 2018-10-20 (×10): 1 [drp] via OPHTHALMIC

## 2018-10-15 MED ORDER — PREDNISOLONE ACETATE 1 % OP SUSP
1.0000 [drp] | Freq: Every day | OPHTHALMIC | Status: DC
  Filled 2018-10-15: qty 5

## 2018-10-15 MED ORDER — LATANOPROST 0.005 % OP SOLN
1.0000 [drp] | Freq: Every day | OPHTHALMIC | Status: DC
  Administered 2018-10-15 – 2018-10-19 (×5): 1 [drp] via OPHTHALMIC

## 2018-10-15 MED ORDER — INSULIN DETEMIR 100 UNIT/ML ~~LOC~~ SOLN
10.0000 [IU] | Freq: Once | SUBCUTANEOUS | Status: AC
  Administered 2018-10-15: 10 [IU] via SUBCUTANEOUS
  Filled 2018-10-15: qty 0.1

## 2018-10-15 MED ORDER — PREDNISOLONE ACETATE 1 % OP SUSP
1.0000 [drp] | Freq: Two times a day (BID) | OPHTHALMIC | Status: DC
  Administered 2018-10-15: 1 [drp] via OPHTHALMIC
  Filled 2018-10-15: qty 5

## 2018-10-15 MED ORDER — PREDNISOLONE ACETATE 1 % OP SUSP
1.0000 [drp] | Freq: Every day | OPHTHALMIC | Status: DC
  Administered 2018-10-15 – 2018-10-17 (×3): 1 [drp] via OPHTHALMIC

## 2018-10-15 MED ORDER — LATANOPROST 0.005 % OP SOLN
1.0000 [drp] | Freq: Every day | OPHTHALMIC | Status: DC
  Filled 2018-10-15: qty 2.5

## 2018-10-15 MED ORDER — PREDNISOLONE ACETATE 1 % OP SUSP: 1.0000 [drp] | Freq: Every day | OPHTHALMIC | Status: DC

## 2018-10-15 MED ORDER — CARVEDILOL 6.25 MG PO TABS
6.2500 mg | ORAL_TABLET | Freq: Two times a day (BID) | ORAL | Status: DC
  Administered 2018-10-15 (×2): 6.25 mg via ORAL
  Filled 2018-10-15 (×2): qty 1

## 2018-10-15 MED ORDER — TIMOLOL MALEATE 0.5 % OP SOLN
1.0000 [drp] | Freq: Two times a day (BID) | OPHTHALMIC | Status: DC
  Filled 2018-10-15: qty 5

## 2018-10-15 MED FILL — Potassium Chloride Inj 2 mEq/ML: INTRAVENOUS | Qty: 40 | Status: AC

## 2018-10-15 MED FILL — Albumin, Human Inj 5%: INTRAVENOUS | Qty: 250 | Status: AC

## 2018-10-15 MED FILL — Magnesium Sulfate Inj 50%: INTRAMUSCULAR | Qty: 10 | Status: AC

## 2018-10-15 MED FILL — Heparin Sodium (Porcine) Inj 1000 Unit/ML: INTRAMUSCULAR | Qty: 30 | Status: AC

## 2018-10-15 MED FILL — Mannitol IV Soln 20%: INTRAVENOUS | Qty: 500 | Status: AC

## 2018-10-15 MED FILL — Lidocaine HCl Local Soln Prefilled Syringe 100 MG/5ML (2%): INTRAMUSCULAR | Qty: 5 | Status: AC

## 2018-10-15 MED FILL — Thrombin (Recombinant) For Soln 20000 Unit: CUTANEOUS | Qty: 1 | Status: AC

## 2018-10-15 MED FILL — Sodium Chloride IV Soln 0.9%: INTRAVENOUS | Qty: 2000 | Status: AC

## 2018-10-15 MED FILL — Heparin Sodium (Porcine) Inj 1000 Unit/ML: INTRAMUSCULAR | Qty: 20 | Status: AC

## 2018-10-15 MED FILL — Sodium Bicarbonate IV Soln 8.4%: INTRAVENOUS | Qty: 50 | Status: AC

## 2018-10-15 MED FILL — Electrolyte-R (PH 7.4) Solution: INTRAVENOUS | Qty: 4000 | Status: AC

## 2018-10-15 NOTE — Progress Notes (Addendum)
1 Day Post-Op Procedure(s) (LRB): CORONARY ARTERY BYPASS GRAFTING (CABG) x3. Left internal mammary take down. Left endoscopic saphenous vein harvest. (N/A) TRANSESOPHAGEAL ECHOCARDIOGRAM (TEE) (N/A) Subjective: Only complaint is of shoulder pain. Previous problems with shoulders.  Objective: Vital signs in last 24 hours: Temp:  [97.5 F (36.4 C)-99.5 F (37.5 C)] 99.3 F (37.4 C) (09/01 0700) Pulse Rate:  [79-104] 85 (09/01 0700) Cardiac Rhythm: Normal sinus rhythm (09/01 0400) Resp:  [12-37] 29 (09/01 0700) BP: (101-150)/(57-99) 134/78 (09/01 0700) SpO2:  [90 %-100 %] 96 % (09/01 0700) Arterial Line BP: (107-163)/(62-129) 126/64 (09/01 0700) FiO2 (%):  [40 %-50 %] 40 % (08/31 1550) Weight:  [90.4 kg-95.6 kg] 95.6 kg (09/01 0500)  Hemodynamic parameters for last 24 hours: PAP: (28-39)/(12-23) 37/14 CO:  [3 L/min-6.1 L/min] 6.1 L/min CI:  [1.5 L/min/m2-3.1 L/min/m2] 3.1 L/min/m2  Intake/Output from previous day: 08/31 0701 - 09/01 0700 In: 4668.6 [I.V.:2507.1; Blood:480; IV Piggyback:1681.5] Out: [id number] [Urine:3525; Blood:800; Chest Tube:560] Intake/Output this shift: No intake/output data recorded.  General appearance: alert and cooperative Neurologic: intact Heart: regular rate and rhythm, S1, S2 normal, no murmur Lungs: clear to auscultation bilaterally Extremities: edema mild Wound: dressing dry  Lab Results: Recent Labs    10/14/18 1845 10/15/18 0348  WBC 13.0* 14.2*  HGB 8.8* 8.2*  HCT 28.8* 26.9*  PLT 229 234   BMET:  Recent Labs    10/14/18 1845 10/15/18 0348  NA 130* 133*  K 4.1 3.9  CL 100 103  CO2 21* 23  GLUCOSE 129* 117*  BUN 8 5*  CREATININE 0.59 0.58  CALCIUM 7.5* 7.8*    PT/INR:  Recent Labs    10/14/18 1257  LABPROT 17.0*  INR 1.4*   ABG    Component Value Date/Time   PHART 7.359 10/14/2018 1755   HCO3 23.2 10/14/2018 1755   TCO2 24 10/14/2018 1755   ACIDBASEDEF 2.0 10/14/2018 1755   O2SAT 99.0 10/14/2018 1755   CBG (last  3)  Recent Labs    10/15/18 0614 10/15/18 0726 10/15/18 0803  GLUCAP 90 151* 149*   CXR: mild left lung atelectasis  ECG: sinus, no acute changes  Assessment/Plan: S/P Procedure(s) (LRB): CORONARY ARTERY BYPASS GRAFTING (CABG) x3. Left internal mammary take down. Left endoscopic saphenous vein harvest. (N/A) TRANSESOPHAGEAL ECHOCARDIOGRAM (TEE) (N/A)  POD 1 Hemodynamically stable in sinus rhythm. Tends to hypertension. Will switch Lopressor to Coreg that she was on before. Resume lower dose Losartan as BP allows . Diurese.  DM: preop Hgb A1c was 6.4 on no meds. Will start some Levemir and switch drip to SSI. Follow. She probably just needs dietary modification, activity, wt loss and outpt followup.  DC chest tubes, swan, arterial line.  Mobilize, IS.   LOS: 5 days    Gaye Pollack 10/15/2018

## 2018-10-15 NOTE — Progress Notes (Signed)
Patient ID: Connie Owens, female   DOB: [birth year], 73 y.o.   MRN: [id number] EVENING ROUNDS NOTE :     [address]       [place] [address]             [phone]                 1 Day Post-Op Procedure(s) (LRB): CORONARY ARTERY BYPASS GRAFTING (CABG) x3. Left internal mammary take down. Left endoscopic saphenous vein harvest. (N/A) TRANSESOPHAGEAL ECHOCARDIOGRAM (TEE) (N/A)  Total Length of Stay:  LOS: 5 days  BP (!) 91/59   Pulse 87   Temp 98.1 F (36.7 C) (Oral)   Resp 14   Ht 5\' 4"  (1.626 m)   Wt 95.6 kg   SpO2 99%   BMI 36.18 kg/m   .Intake/Output      08/31 0701 - 09/01 0700 09/01 0701 - 09/02 0700   I.V. (mL/kg) 2507.1 (26.2) 483.5 (5.1)   Blood 480    IV Piggyback 1681.5    Total Intake(mL/kg) 4668.6 (48.8) 483.5 (5.1)   Urine (mL/kg/hr) 3525 (1.5) 480 (0.5)   Emesis/NG output 0    Stool     Blood 800    Chest Tube 560 100   Total Output 4885 580   Net -216.4 -96.5          . cefUROXime (ZINACEF)  IV 1.5 g (10/15/18 1558)  . lactated ringers Stopped (10/14/18 1300)  . lactated ringers 20 mL/hr at 10/14/18 1326     Lab Results  Component Value Date   WBC 12.9 (H) 10/15/2018   HGB 8.6 (L) 10/15/2018   HCT 28.4 (L) 10/15/2018   PLT 237 10/15/2018   GLUCOSE 117 (H) 10/15/2018   CHOL 132 04/09/2018   TRIG 141 04/09/2018   HDL 59 04/09/2018   LDLDIRECT 140.8 03/18/2013   LDLCALC 45 04/09/2018   ALT 14 10/14/2018   AST 15 10/14/2018   NA 133 (L) 10/15/2018   K 3.9 10/15/2018   CL 103 10/15/2018   CREATININE 0.64 10/15/2018   BUN 5 (L) 10/15/2018   CO2 23 10/15/2018   TSH 3.67 08/08/2018   INR 1.4 (H) 10/14/2018   HGBA1C 6.4 (H) 10/13/2018   MICROALBUR <0.7 11/17/2014    One day postop Stable day   Grace Isaac MD  Beeper [phone] Office [phone] 10/15/2018 4:55 PM

## 2018-10-15 NOTE — Addendum Note (Signed)
Addendum  created 10/15/18 0941 by Josephine Igo, CRNA   Order list changed

## 2018-10-16 ENCOUNTER — Inpatient Hospital Stay (HOSPITAL_COMMUNITY): Payer: Medicare Other

## 2018-10-16 LAB — BASIC METABOLIC PANEL
Anion gap: 9 (ref 5–15)
BUN: 9 mg/dL (ref 8–23)
CO2: 22 mmol/L (ref 22–32)
Calcium: 7.9 mg/dL — ABNORMAL LOW (ref 8.9–10.3)
Chloride: 101 mmol/L (ref 98–111)
Creatinine, Ser: 0.72 mg/dL (ref 0.44–1.00)
GFR calc Af Amer: >60 mL/min (ref >60)
GFR calc non Af Amer: >60 mL/min (ref >60)
Glucose, Bld: 103 mg/dL — ABNORMAL HIGH (ref 70–99)
Potassium: 4 mmol/L (ref 3.5–5.1)
Sodium: 132 mmol/L — ABNORMAL LOW (ref 135–145)

## 2018-10-16 LAB — CBC
HCT: 26.1 % — ABNORMAL LOW (ref 36.0–46.0)
Hemoglobin: 7.7 g/dL — ABNORMAL LOW (ref 12.0–15.0)
MCH: 24.9 pg — ABNORMAL LOW (ref 26.0–34.0)
MCHC: 29.5 g/dL — ABNORMAL LOW (ref 30.0–36.0)
MCV: 84.5 fL (ref 80.0–100.0)
Platelets: 207 10*3/uL (ref 150–400)
RBC: 3.09 MIL/uL — ABNORMAL LOW (ref 3.87–5.11)
RDW: 15.8 % — ABNORMAL HIGH (ref 11.5–15.5)
WBC: 9.8 10*3/uL (ref 4.0–10.5)
nRBC: 0.3 % — ABNORMAL HIGH (ref 0.0–0.2)

## 2018-10-16 LAB — GLUCOSE, CAPILLARY
Glucose-Capillary: 107 mg/dL — ABNORMAL HIGH (ref 70–99)
Glucose-Capillary: 111 mg/dL — ABNORMAL HIGH (ref 70–99)
Glucose-Capillary: 115 mg/dL — ABNORMAL HIGH (ref 70–99)
Glucose-Capillary: 119 mg/dL — ABNORMAL HIGH (ref 70–99)
Glucose-Capillary: 135 mg/dL — ABNORMAL HIGH (ref 70–99)
Glucose-Capillary: 137 mg/dL — ABNORMAL HIGH (ref 70–99)

## 2018-10-16 MED ORDER — FUROSEMIDE 40 MG PO TABS: 40.0000 mg | ORAL_TABLET | Freq: Every day | ORAL | Status: DC

## 2018-10-16 MED ORDER — TRAMADOL HCL 50 MG PO TABS: 50.0000 mg | ORAL_TABLET | ORAL | Status: DC | PRN

## 2018-10-16 MED ORDER — CARVEDILOL 3.125 MG PO TABS
3.1250 mg | ORAL_TABLET | Freq: Two times a day (BID) | ORAL | Status: DC
  Administered 2018-10-16 – 2018-10-18 (×5): 3.125 mg via ORAL
  Filled 2018-10-16 (×5): qty 1

## 2018-10-16 MED ORDER — AMIODARONE IV BOLUS ONLY 150 MG/100ML
150.0000 mg | Freq: Once | INTRAVENOUS | Status: AC
  Administered 2018-10-16: 150 mg via INTRAVENOUS
  Filled 2018-10-16: qty 100

## 2018-10-16 MED ORDER — FE FUMARATE-B12-VIT C-FA-IFC PO CAPS
1.0000 | ORAL_CAPSULE | Freq: Two times a day (BID) | ORAL | Status: DC
  Administered 2018-10-17 – 2018-10-20 (×7): 1 via ORAL
  Filled 2018-10-16 (×7): qty 1

## 2018-10-16 MED ORDER — ONDANSETRON HCL 4 MG/2ML IJ SOLN
4.0000 mg | Freq: Four times a day (QID) | INTRAMUSCULAR | Status: DC | PRN
  Administered 2018-10-18 (×2): 4 mg via INTRAVENOUS
  Filled 2018-10-16 (×2): qty 2

## 2018-10-16 MED ORDER — MOVING RIGHT ALONG BOOK
Freq: Once | Status: DC
  Filled 2018-10-16: qty 1

## 2018-10-16 MED ORDER — OXYCODONE HCL 5 MG PO TABS
5.0000 mg | ORAL_TABLET | ORAL | Status: DC | PRN
  Administered 2018-10-17 – 2018-10-19 (×9): 10 mg via ORAL
  Filled 2018-10-16 (×9): qty 2

## 2018-10-16 MED ORDER — SODIUM CHLORIDE 0.9% FLUSH
3.0000 mL | Freq: Two times a day (BID) | INTRAVENOUS | Status: DC
  Administered 2018-10-16 – 2018-10-19 (×6): 3 mL via INTRAVENOUS

## 2018-10-16 MED ORDER — INSULIN ASPART 100 UNIT/ML ~~LOC~~ SOLN
0.0000 [IU] | Freq: Three times a day (TID) | SUBCUTANEOUS | Status: DC
  Administered 2018-10-17 – 2018-10-18 (×3): 2 [IU] via SUBCUTANEOUS
  Administered 2018-10-19: 4 [IU] via SUBCUTANEOUS
  Administered 2018-10-19: 2 [IU] via SUBCUTANEOUS

## 2018-10-16 MED ORDER — ACETAMINOPHEN 325 MG PO TABS: 650.0000 mg | ORAL_TABLET | Freq: Four times a day (QID) | ORAL | Status: DC | PRN

## 2018-10-16 MED ORDER — ONDANSETRON HCL 4 MG PO TABS: 4.0000 mg | ORAL_TABLET | Freq: Four times a day (QID) | ORAL | Status: DC | PRN

## 2018-10-16 MED ORDER — SODIUM CHLORIDE 0.9 % IV SOLN: 250.0000 mL | INTRAVENOUS | Status: DC | PRN

## 2018-10-16 MED ORDER — CHLORHEXIDINE GLUCONATE CLOTH 2 % EX PADS
6.0000 | MEDICATED_PAD | Freq: Every day | CUTANEOUS | Status: DC
  Administered 2018-10-17 – 2018-10-19 (×3): 6 via TOPICAL

## 2018-10-16 MED ORDER — POTASSIUM CHLORIDE CRYS ER 20 MEQ PO TBCR
20.0000 meq | EXTENDED_RELEASE_TABLET | Freq: Two times a day (BID) | ORAL | Status: DC
  Administered 2018-10-16: 20 meq via ORAL
  Filled 2018-10-16: qty 1

## 2018-10-16 MED ORDER — SENNOSIDES-DOCUSATE SODIUM 8.6-50 MG PO TABS: 1.0000 | ORAL_TABLET | Freq: Two times a day (BID) | ORAL | Status: DC | PRN

## 2018-10-16 MED ORDER — ASPIRIN EC 325 MG PO TBEC
325.0000 mg | DELAYED_RELEASE_TABLET | Freq: Every day | ORAL | Status: DC
  Administered 2018-10-16 – 2018-10-20 (×5): 325 mg via ORAL
  Filled 2018-10-16 (×5): qty 1

## 2018-10-16 MED ORDER — TRAMADOL HCL 50 MG PO TABS
50.0000 mg | ORAL_TABLET | ORAL | Status: DC | PRN
  Administered 2018-10-16 – 2018-10-17 (×2): 50 mg via ORAL
  Filled 2018-10-16 (×3): qty 1

## 2018-10-16 MED ORDER — INSULIN ASPART 100 UNIT/ML ~~LOC~~ SOLN: 0.0000 [IU] | Freq: Three times a day (TID) | SUBCUTANEOUS | Status: DC

## 2018-10-16 MED ORDER — PANTOPRAZOLE SODIUM 40 MG PO TBEC
40.0000 mg | DELAYED_RELEASE_TABLET | Freq: Every day | ORAL | Status: DC
  Administered 2018-10-17 – 2018-10-20 (×4): 40 mg via ORAL
  Filled 2018-10-16 (×4): qty 1

## 2018-10-16 MED ORDER — SODIUM CHLORIDE 0.9% FLUSH: 3.0000 mL | INTRAVENOUS | Status: DC | PRN

## 2018-10-16 MED ORDER — AMIODARONE HCL 200 MG PO TABS
200.0000 mg | ORAL_TABLET | Freq: Two times a day (BID) | ORAL | Status: DC
  Administered 2018-10-16 – 2018-10-18 (×6): 200 mg via ORAL
  Filled 2018-10-16 (×6): qty 1

## 2018-10-16 NOTE — Progress Notes (Signed)
EVENING ROUNDS NOTE :     [address]       [place] [address]             [phone]                 2 Days Post-Op Procedure(s) (LRB): CORONARY ARTERY BYPASS GRAFTING (CABG) x3. Left internal mammary take down. Left endoscopic saphenous vein harvest. (N/A) TRANSESOPHAGEAL ECHOCARDIOGRAM (TEE) (N/A)   Total Length of Stay:  LOS: 6 days  Events:  Labile BP, low uop.  Asymptomatic Creat stable    BP (!) 98/58 (BP Location: Left Arm)   Pulse 87   Temp 98.5 F (36.9 C) (Oral)   Resp 17   Ht 5\' 4"  (1.626 m)   Wt 92.5 kg   SpO2 96%   BMI 35.00 kg/m         . lactated ringers Stopped (10/14/18 1300)  . lactated ringers 20 mL/hr at 10/14/18 1326    I/O last 3 completed shifts: In: 2058.9 [P.O.:480; I.V.:1029; IV Piggyback:549.9] Out: 2435 [Urine:1965; Chest Tube:470]   CBC Latest Ref Rng & Units 10/16/2018 10/15/2018 10/15/2018  WBC 4.0 - 10.5 K/uL 9.8 10.6(H) 12.9(H)  Hemoglobin 12.0 - 15.0 g/dL 7.7(L) 8.7(L) 8.6(L)  Hematocrit 36.0 - 46.0 % 26.1(L) 29.2(L) 28.4(L)  Platelets 150 - 400 K/uL 207 227 237    BMP Latest Ref Rng & Units 10/16/2018 10/15/2018 10/15/2018  Glucose 70 - 99 mg/dL 103(H) 163(H) -  BUN 8 - 23 mg/dL 9 9 -  Creatinine 0.44 - 1.00 mg/dL 0.72 0.79 0.64  BUN/Creat Ratio 12 - 28 - - -  Sodium 135 - 145 mmol/L 132(L) 132(L) -  Potassium 3.5 - 5.1 mmol/L 4.0 4.0 -  Chloride 98 - 111 mmol/L 101 100 -  CO2 22 - 32 mmol/L 22 23 -  Calcium 8.9 - 10.3 mg/dL 7.9(L) 8.1(L) -    ABG    Component Value Date/Time   PHART 7.359 10/14/2018 1755   PCO2ART 41.2 10/14/2018 1755   PO2ART 136.0 (H) 10/14/2018 1755   HCO3 23.2 10/14/2018 1755   TCO2 24 10/14/2018 1755   ACIDBASEDEF 2.0 10/14/2018 1755   O2SAT 99.0 10/14/2018 [place], MD 10/16/2018 4:12 PM

## 2018-10-16 NOTE — Plan of Care (Signed)
POD 2, following sternal precautions, increasing activity, participating in ADLs. Education provided on Heart Healthy/Post CABG lifestyle changes.   Problem: Education: Goal: Knowledge of General Education information will improve Description: Including pain rating scale, medication(s)/side effects and non-pharmacologic comfort measures Outcome: Progressing   Problem: Health Behavior/Discharge Planning: Goal: Ability to manage health-related needs will improve Outcome: Progressing   Problem: Clinical Measurements: Goal: Ability to maintain clinical measurements within normal limits will improve Outcome: Progressing Goal: Will remain free from infection Outcome: Progressing Goal: Respiratory complications will improve Outcome: Progressing Goal: Cardiovascular complication will be avoided Outcome: Progressing   Problem: Activity: Goal: Risk for activity intolerance will decrease Outcome: Progressing   Problem: Coping: Goal: Level of anxiety will decrease Outcome: Progressing   Problem: Pain Managment: Goal: General experience of comfort will improve Outcome: Progressing   Problem: Safety: Goal: Ability to remain free from injury will improve Outcome: Progressing   Problem: Skin Integrity: Goal: Risk for impaired skin integrity will decrease Outcome: Progressing   Problem: Education: Goal: Will demonstrate proper wound care and an understanding of methods to prevent future damage Outcome: Progressing   Problem: Activity: Goal: Risk for activity intolerance will decrease Outcome: Progressing   Problem: Cardiac: Goal: Will achieve and/or maintain hemodynamic stability Outcome: Progressing

## 2018-10-16 NOTE — Progress Notes (Signed)
Plan to ambulate in hallway, upon standing, pt c/o dizziness, able to stand x 3 min then transferred to chair. Dizziness improved. Reinforced importance of ambulation and agrees to try ambulating in hallways today.   Stable during shift. Weaned from 3 lpm to 1 lpm. Encouraging TCDB/IS. No flatus/BM but tolerating full liquid diet, anticipate advancing diet today.

## 2018-10-16 NOTE — Progress Notes (Signed)
2 Days Post-Op Procedure(s) (LRB): CORONARY ARTERY BYPASS GRAFTING (CABG) x3. Left internal mammary take down. Left endoscopic saphenous vein harvest. (N/A) TRANSESOPHAGEAL ECHOCARDIOGRAM (TEE) (N/A) Subjective: No complaints. Slept well. Hungry and ready for regular food.  Objective: Vital signs in last 24 hours: Temp:  [98.1 F (36.7 C)-99.3 F (37.4 C)] 98.6 F (37 C) (09/02 0400) Pulse Rate:  [79-97] 86 (09/02 0700) Cardiac Rhythm: Normal sinus rhythm (09/02 0400) Resp:  [12-27] 20 (09/02 0700) BP: (89-161)/(47-89) 96/69 (09/02 0700) SpO2:  [86 %-100 %] 94 % (09/02 0700) Arterial Line BP: (107-166)/(55-83) 112/55 (09/01 1100) Weight:  [92.5 kg] 92.5 kg (09/02 0600)  Hemodynamic parameters for last 24 hours: PAP: (33-43)/(10-17) 33/10  Intake/Output from previous day: 09/01 0701 - 09/02 0700 In: 1493.5 [P.O.:480; I.V.:813.5; IV Piggyback:200] Out: 1430 [Urine:1330; Chest Tube:100] Intake/Output this shift: No intake/output data recorded.  General appearance: alert and cooperative Neurologic: intact Heart: regular rate and rhythm, S1, S2 normal, no murmur Lungs: clear to auscultation bilaterally Extremities:  no edema Wound: dressings dry  Lab Results: Recent Labs    10/15/18 1756 10/16/18 0426  WBC 10.6* 9.8  HGB 8.7* 7.7*  HCT 29.2* 26.1*  PLT 227 207   BMET:  Recent Labs    10/15/18 1756 10/16/18 0426  NA 132* 132*  K 4.0 4.0  CL 100 101  CO2 23 22  GLUCOSE 163* 103*  BUN 9 9  CREATININE 0.79 0.72  CALCIUM 8.1* 7.9*    PT/INR:  Recent Labs    10/14/18 1257  LABPROT 17.0*  INR 1.4*   ABG    Component Value Date/Time   PHART 7.359 10/14/2018 1755   HCO3 23.2 10/14/2018 1755   TCO2 24 10/14/2018 1755   ACIDBASEDEF 2.0 10/14/2018 1755   O2SAT 99.0 10/14/2018 1755   CBG (last 3)  Recent Labs    10/15/18 1540 10/15/18 2018 10/16/18 0026  GLUCAP 138* 118* 135*   CXR: mild atelectasis  Assessment/Plan: S/P Procedure(s)  (LRB): CORONARY ARTERY BYPASS GRAFTING (CABG) x3. Left internal mammary take down. Left endoscopic saphenous vein harvest. (N/A) TRANSESOPHAGEAL ECHOCARDIOGRAM (TEE) (N/A)  POD 2 Hemodynamically stable in sinus rhythm. Having frequent PAC's so will start amiodarone. She is likely to end up in atrial fib. Continue Coreg.  DM: glucose under good control. Continue Levemir and SSI. Advance diet to CHO mod.  Wt a few lbs over preop. Will diurese further.  Ambulate, IS.  DC sleeve and foley.   Transfer to 4E.     LOS: 6 days    Connie Owens 10/16/2018

## 2018-10-16 NOTE — Progress Notes (Signed)
CARDIAC REHAB PHASE I   PRE:  Rate/Rhythm: 87 sR with many PACs    BP: lying 94/63, lying 87/75    SaO2: 100 1L  MODE:  Ambulation: 60 ft   POST:  Rate/Rhythm: 108 ST with many PACs    BP: sitting 98/77     SaO2: 94 2L  Came to ambulate pt since she is awaiting tx to 4E. Pt has been dizzy and therefore this was first walk. Moved to EOB fairly independently. Stood and slightly dizzy. Took BP standing which was only slightly lower. Pt able to walk in hall with EVA and gait belt (assist x2 for chair follow). After 30 ft in hall she c/o strain in chest from EVA and slight dizziness. Felt she needed to return to room. Return to bed (declined chair due to it being uncomfortable). Pt tired. Slightly increased PACs, BP stable.  [place], ACSM 10/16/2018 1:41 PM

## 2018-10-16 NOTE — Discharge Summary (Signed)
Physician Discharge Summary        [address]       [place] [address]             [phone]     Patient ID: Connie Owens MRN: [id number] DOB/AGE: [birth year] 73 y.o.  Admit date: 10/10/2018 Discharge date: 10/20/2018  Admission Diagnoses:  Patient Active Problem List   Diagnosis Date Noted   CAD in native artery 10/10/2018   Leg pain, bilateral 08/06/2018   Chronic tension-type headache, not intractable 04/17/2018   Dry eyes, bilateral 04/17/2018   Atrophic vaginitis 03/29/2018   Occult blood in stools 03/29/2018   Osteoarthritis of left knee 12/07/2017   Aftercare 11/15/2017   Right rotator cuff tear arthropathy 10/31/2017   Pain in joint of right shoulder 07/31/2017   Bilateral shoulder pain 03/27/2017   Cluster headache, not intractable 05/04/2016   History of colonic polyps 2020/02/1714   CAD (coronary artery disease) 07/23/2014   Glaucoma (increased eye pressure) 07/30/2013   OSA on CPAP 07/30/2013   Obesity, unspecified 07/30/2013   Obesity (BMI 30-39.9) 03/18/2013   Abdominal pain, other specified site 03/18/2013   ERRONEOUS ENCOUNTER--DISREGARD 03/06/2013   Internal hemorrhoids with other complication 0000000   Nonspecific abnormal finding in stool contents 11/05/2012   Abdominal pain, left upper quadrant 11/05/2012   Diffuse pain 07/24/2012   Migraine headache without aura 07/23/2012   Headache, paroxysmal hemicrania, chronic 07/23/2012   OSA (obstructive sleep apnea) 06/11/2012   NSTEMI (non-ST elevated myocardial infarction) ([facility]) 09/28/2011   Chest pain 09/28/2011   IBS (irritable bowel syndrome) 03/22/2011   Dyslipidemia, goal LDL below 70 12/21/2010   DIARRHEA, CHRONIC 01/13/2010   Urinary tract infectious disease 11/18/2009   COCCYGEAL PAIN 05/11/2009   Anorectal pain 02/02/2009   HIP PAIN, LEFT 02/02/2009   CORNEAL DISORDER 12/11/2008   POSTMENOPAUSAL STATUS 12/11/2008   ADVERSE  DRUG REACTION 12/11/2008   DYSPHAGIA UNSPECIFIED 10/02/2008   BACK PAIN [id number]   Dysmetabolic syndrome X 99991111   OSTEOARTHRITIS, KNEE, RIGHT 06/19/2007   CARPAL TUNNEL SYNDROME 04/25/2007   ADENOMATOUS COLONIC POLYP 04/23/2007   GERD 04/23/2007   DIVERTICULAR DISEASE 04/23/2007   HIATAL HERNIA 11/16/2006   ANXIETY STATE NOS 11/12/2006   Essential hypertension 10/30/2006    Discharge Diagnoses:  Active Problems:   CAD (coronary artery disease)   CAD in native artery   S/P CABG x 3   Discharged Condition: good  HPI:   The patient is a 73 year old woman with a history of hypertension, hyperlipidemia, OSA on CPAP, and coronary artery disease status post non-ST segment elevation MI in 09/2011.  Cardiac catheterization at that time showed 50% ostial left main stenosis and moderate disease in the LAD and RCA territories.  Her coronary disease is not felt to be amenable to PCI and she was treated medically.  She had an echocardiogram in January 2019 which showed ejection fraction of 55 to 60% with no regional wall motion abnormalities.  There is no significant valvular disease.  She reports that since January she has had exertional substernal chest discomfort as well as aching up into her shoulders, neck, and down her arms.  She was seen by cardiology in February and started on Imdur which seemed to help but over the past couple months she has noted progression of her symptoms that have been occurring with less and less exertion.  She said that last week she was visiting her sister and was having chest discomfort up into her neck and  shoulders with any activity.  She was walking upstairs and getting markedly short of breath.  She said she knew something was definitely wrong.  She has also had marked tiredness and exertional fatigue.   Hospital Course:   On 10/14/2018 Connie Owens underwent a coronary bypass grafting x3 with Dr. Cyndia Bent.  She tolerated the procedure well was  transferred to the surgical ICU for continued care.  She was extubated in a timely manner.  Postop day 1 she remained hemodynamically stable and in normal sinus rhythm.  We switched to Coreg.  We resumed her losartan at a lower dose.  We initiated diuretics for fluid overload.  We discontinued her chest tubes, Swan-Ganz catheter, and arterial line.  We will begin mobilize patient.  We encouraged use of incentive spirometry.  Postop day 2 she was having some frequent PVCs so we started amiodarone.  We continued diuretics for fluid overload.  We discontinued her central line and Foley catheter.  She was stable to transfer to 4 E. for continued care.  The patients pacing wires were removed without difficulty.  She developed Atrial Fibrillation with RVR.  She was treated with IV Amiodarone with successful conversion to NSR.  She was transitioned to an oral regimen of Amiodarone and her Coreg dose was titrated to her home regimen.  She continues to maintain NSR.  She is ambulating independently.  Her incisions are healing without evidence of infection.  She is medically stable for discharge home today.   Consults: None  Significant Diagnostic Studies:  CLINICAL DATA:  Status post CABG  EXAM: PORTABLE CHEST 1 VIEW  COMPARISON:  10/15/2018  FINDINGS: Cardiomegaly and CABG changes again identified.  Mediastinal and thoracostomy tubes and Swan-Ganz catheter have been removed.  RIGHT IJ central venous catheter sheath remains with tip overlying the UPPER SVC.  Continued LEFT LOWER lung atelectasis noted.  No evidence of pulmonary edema or pneumothorax.  IMPRESSION: Status post CABG with support apparatus removal as described. Continued LEFT LOWER lung atelectasis. No pneumothorax.   Electronically Signed   By: Margarette Canada M.D.   On: 10/16/2018 08:19   Treatments:  CARDIOVASCULAR SURGERY OPERATIVE NOTE  10/14/2018  Surgeon:  Gaye Pollack, MD  First Assistant: Nicholes Rough,  PA-C   Preoperative Diagnosis:  Severe multi-vessel coronary artery disease   Postoperative Diagnosis:  Same   Procedure:  1. Median Sternotomy 2. Extracorporeal circulation 3.   Coronary artery bypass grafting x 3   Left internal mammary graft to the LAD  SVG to OM 1  SVG to RCA  4.   Endoscopic vein harvest from the left leg   Anesthesia:  General Endotracheal   Clinical History/Surgical Indication:  This 73 year old woman has high-grade left main and severe multivessel coronary disease with unstable anginal symptoms. I agree that coronary bypass graft surgery is indicated to prevent further ischemia and infarction and improve her symptoms. She was on Plavix which was discontinued 3 days preop. P2Y12 was 278 prior to discontinuing Plavix. I discussed the operative procedure with the patient andher daughterincluding alternatives, benefits and risks; including but not limited to bleeding, blood transfusion, infection, stroke, myocardial infarction, graft failure, heart block requiring a permanent pacemaker, organ dysfunction, and death. Keshea Camaj Stroudunderstands and agrees to proceed.  Discharge Exam:  Blood pressure 114/73, pulse 81, temperature 97.7 F (36.5 C), temperature source Oral, resp. rate 15, height 5\' 4"  (1.626 m), weight 93.9 kg, SpO2 99 %.   General appearance: alert, cooperative and no distress Heart:  regular rate and rhythm Lungs: clear to auscultation bilaterally Abdomen: soft, non-tender; bowel sounds normal; no masses,  no organomegaly Extremities: edema minimal Wound: clean and dry  Discharge disposition: 01-Home or Self Care  Discharge Instructions    Amb Referral to Cardiac Rehabilitation   Complete by: As directed    Diagnosis: CABG   CABG X ___: 3   After initial evaluation and assessments completed: Virtual Based Care may be provided alone or in conjunction with Phase 2 Cardiac Rehab based on patient barriers.:  Yes     Allergies as of 10/20/2018      Reactions   Iohexol Rash   rash   Iodinated Diagnostic Agents Other (See Comments), Rash   Other Reaction: Other reaction   Statins Other (See Comments)   Cause muscle aches/pains   Imdur [isosorbide Nitrate]    Severe headaches   Latex Rash   Tape Rash, Other (See Comments)   Plastic tap-blisters   Tramadol Itching   Verapamil Other (See Comments)   dizziness      Medication List    STOP taking these medications   clopidogrel 75 MG tablet Commonly known as: PLAVIX   isosorbide mononitrate 120 MG 24 hr tablet Commonly known as: IMDUR   losartan 100 MG tablet Commonly known as: COZAAR     TAKE these medications   acetaminophen 500 MG tablet Commonly known as: TYLENOL Take 1,000 mg by mouth every 6 (six) hours as needed (knee pain.).   amiodarone 200 MG tablet Commonly known as: PACERONE Take 2 tablets (400 mg total) by mouth 2 (two) times daily. For 7 days, then decrease to 200 twice daily x 7 days, then decrease to 200 mg daily   aspirin 325 MG EC tablet Take 1 tablet (325 mg total) by mouth daily. What changed:   medication strength  how much to take   azelastine 0.1 % nasal spray Commonly known as: ASTELIN Place 2 sprays into both nostrils 2 (two) times daily as needed for rhinitis or allergies.   brimonidine-timolol 0.2-0.5 % ophthalmic solution Commonly known as: COMBIGAN Place 1 drop into the left eye 2 (two) times daily.   carvedilol 6.25 MG tablet Commonly known as: COREG Take 1 tablet (6.25 mg total) by mouth 2 (two) times daily.   clobetasol ointment 0.05 % Commonly known as: TEMOVATE Apply 1 application topically 2 (two) times daily. What changed:   when to take this  reasons to take this   estradiol 1 MG tablet Commonly known as: ESTRACE Take 1 mg by mouth at bedtime.   Evolocumab 140 MG/ML Soaj Commonly known as: Repatha SureClick Inject [id number] mg into the skin every 14 (fourteen) days. What  changed: additional instructions   fluticasone 50 MCG/ACT nasal spray Commonly known as: FLONASE Place 2 sprays into both nostrils daily. What changed:   when to take this  reasons to take this   gabapentin 100 MG capsule Commonly known as: NEURONTIN Take 4 capsules (400 mg total) by mouth 3 (three) times daily. 4 po tid for pain What changed:   when to take this  additional instructions   levocetirizine 5 MG tablet Commonly known as: Xyzal Take 1 tablet (5 mg total) by mouth every evening.   multivitamin with minerals Tabs tablet Take 1 tablet by mouth daily after breakfast.   nitroGLYCERIN 0.4 MG SL tablet Commonly known as: NITROSTAT Place 1 tablet (0.4 mg total) under the tongue every 5 (five) minutes x 3 doses as needed for chest  pain.   nortriptyline 10 MG capsule Commonly known as: PAMELOR Use between 2 and 5 capsules at night po , prn-depending on headache What changed:   how much to take  how to take this  when to take this   oxyCODONE 5 MG immediate release tablet Commonly known as: Oxy IR/ROXICODONE Take 1-2 tablets (5-10 mg total) by mouth every 4 (four) hours as needed for severe pain.   phenazopyridine 200 MG tablet Commonly known as: Pyridium Take 1 tablet (200 mg total) by mouth 3 (three) times daily as needed for pain.   prednisoLONE acetate 1 % ophthalmic suspension Commonly known as: PRED FORTE Place 1 drop into both eyes See admin instructions. 1 drop into the right eye twice daily 1 drop into the left eye once daily at night.   predniSONE 50 MG tablet Commonly known as: DELTASONE TAKE 1 TAB 13 HOURS PRIOR, 7 HOURS PRIOR, AND PRIOR TO LEAVING FOR PROCEDURE   tiZANidine 4 MG tablet Commonly known as: ZANAFLEX Take 4 mg by mouth daily at 6 PM.   Xalatan 0.005 % ophthalmic solution Generic drug: latanoprost Place 1 drop into both eyes at bedtime.   zolpidem 5 MG tablet Commonly known as: AMBIEN Take 1 tablet (5 mg total) by mouth  at bedtime as needed for sleep. What changed: when to take this      Follow-up Information    Ann Held, DO. Call in 1 day(s).   Specialty: Family Medicine Contact information: [phone] [address] [phone]        Minus Breeding, MD .   Specialty: Cardiology Contact information: [place] [address] [phone]        Gaye Pollack, MD Follow up.   Specialty: Cardiothoracic Surgery Why: Your routine follow-up appointment is on 11/13/2018 at 2:30pm. Please arrive at 2:00pm for a chest xray located at [facility] which is on the first floor of our building.  Contact information: [address] [place] [address] [phone]        suture removal nursing appointment Follow up.   Why: Your suture removal appointment is on 10/25/2018 at 11:00am.  Contact information: Dr. Vivi Martens office       [facility] Phill Myron, NP Follow up.   Specialties: Nurse Practitioner, Radiology, Cardiology Why: Your routine follow-up appointment is on 11/05/2018 at 8:15am please bring your medication list.  Contact information: [address] [place] [address] [phone]          The patient has been discharged on:   1.Beta Blocker:  Yes [ yes  ]                              No   [   ]                              If No, reason:  2.Ace Inhibitor/ARB: Yes [   ]                                     No  [  no  ]  If No, reason: labile BP  3.Statin:   Yes [   ]                  No  [ no  ]                  If No, reason: allergies  4.Shela CommonsVelta Addison  [ yes  ]                  No   [   ]                  If No, reason:    Signed:  Original note by Nicholes Rough PA-C  Updated by:  Ellwood Handler, PA-C  10/20/2018, 8:00 AM

## 2018-10-16 NOTE — Progress Notes (Signed)
Back to bed from chair c/o diziness having more PAC bp in 80 s SBP.  MD aware see orders. Patient resting after transfer. Encouraged drinking water. amioderone bolus given per orders.  Diet changed over to cardiac will continue to monitor

## 2018-10-17 LAB — BASIC METABOLIC PANEL
Anion gap: 10 (ref 5–15)
BUN: 10 mg/dL (ref 8–23)
CO2: 21 mmol/L — ABNORMAL LOW (ref 22–32)
Calcium: 8.1 mg/dL — ABNORMAL LOW (ref 8.9–10.3)
Chloride: 98 mmol/L (ref 98–111)
Creatinine, Ser: 0.66 mg/dL (ref 0.44–1.00)
GFR calc Af Amer: >60 mL/min (ref >60)
GFR calc non Af Amer: >60 mL/min (ref >60)
Glucose, Bld: 218 mg/dL — ABNORMAL HIGH (ref 70–99)
Potassium: 4.3 mmol/L (ref 3.5–5.1)
Sodium: 129 mmol/L — ABNORMAL LOW (ref 135–145)

## 2018-10-17 LAB — BLOOD GAS, ARTERIAL
Acid-Base Excess: 2.1 mmol/L — ABNORMAL HIGH (ref 0.0–2.0)
Bicarbonate: 26.2 mmol/L (ref 20.0–28.0)
Drawn by: 404151
FIO2: 21
O2 Saturation: 92.9 %
Patient temperature: 98.6
pCO2 arterial: 41.3 mmHg (ref 32.0–48.0)
pH, Arterial: 7.418 (ref 7.350–7.450)
pO2, Arterial: 68.7 mmHg — ABNORMAL LOW (ref 83.0–108.0)

## 2018-10-17 LAB — CBC
HCT: 26.4 % — ABNORMAL LOW (ref 36.0–46.0)
Hemoglobin: 8 g/dL — ABNORMAL LOW (ref 12.0–15.0)
MCH: 25 pg — ABNORMAL LOW (ref 26.0–34.0)
MCHC: 30.3 g/dL (ref 30.0–36.0)
MCV: 82.5 fL (ref 80.0–100.0)
Platelets: 265 10*3/uL (ref 150–400)
RBC: 3.2 MIL/uL — ABNORMAL LOW (ref 3.87–5.11)
RDW: 15.6 % — ABNORMAL HIGH (ref 11.5–15.5)
WBC: 9.3 10*3/uL (ref 4.0–10.5)
nRBC: 0.8 % — ABNORMAL HIGH (ref 0.0–0.2)

## 2018-10-17 LAB — GLUCOSE, CAPILLARY
Glucose-Capillary: 97 mg/dL (ref 70–99)
Glucose-Capillary: 117 mg/dL — ABNORMAL HIGH (ref 70–99)
Glucose-Capillary: 124 mg/dL — ABNORMAL HIGH (ref 70–99)
Glucose-Capillary: 113 mg/dL — ABNORMAL HIGH (ref 70–99)

## 2018-10-17 LAB — MAGNESIUM: Magnesium: 1.8 mg/dL (ref 1.7–2.4)

## 2018-10-17 MED ORDER — MAGNESIUM SULFATE IN D5W 1-5 GM/100ML-% IV SOLN
1.0000 g | Freq: Once | INTRAVENOUS | Status: AC
  Administered 2018-10-17: 1 g via INTRAVENOUS
  Filled 2018-10-17 (×2): qty 100

## 2018-10-17 NOTE — Progress Notes (Signed)
CARDIAC REHAB PHASE I   PRE:  Rate/Rhythm: 90 SR  BP:  Lying: 109/87      SaO2: 97 2L  MODE:  Ambulation: 250 ft   POST:  Rate/Rhythm: 112 ST with PVCs  BP:  Sitting: 114/93    SaO2: 95 2L   Offered to walk with pt earlier today and pt states she needed more time. Returned after lunch to walk with pt, and pt willing. Pt ambulated 241ft in hallway assist of 1 with chair follow. Pt took two short standing rest breaks but did not feel like she had to sit today. Pt denies SOB or dizziness. Pt looks stronger today and able to significantly increase the distance ambulated. Pt helped to BR, then to recliner upon return from walk. Encouraged pt to walk again later today and continue IS use. Pt given graham crackers and peanut butter, stressed importance of nutrition after surgery. Will follow-up tomorrow and try to wean O2.   [id number] Rufina Falco, RN BSN 10/17/2018 1:53 PM

## 2018-10-17 NOTE — Discharge Instructions (Signed)

## 2018-10-17 NOTE — Progress Notes (Signed)
3 Days Post-Op Procedure(s) (LRB): CORONARY ARTERY BYPASS GRAFTING (CABG) x3. Left internal mammary take down. Left endoscopic saphenous vein harvest. (N/A) TRANSESOPHAGEAL ECHOCARDIOGRAM (TEE) (N/A) Subjective: No complaints. Ambulated this am.  Objective: Vital signs in last 24 hours: Temp:  [97.2 F (36.2 C)-98.5 F (36.9 C)] 98.2 F (36.8 C) (09/03 0400) Pulse Rate:  [79-92] 87 (09/03 0600) Cardiac Rhythm: Normal sinus rhythm (09/03 0130) Resp:  [16-27] 19 (09/03 0600) BP: (83-115)/(38-77) 106/62 (09/03 0600) SpO2:  [85 %-100 %] 97 % (09/03 0600) Weight:  [91.4 kg] 91.4 kg (09/03 0500)  Hemodynamic parameters for last 24 hours:    Intake/Output from previous day: 09/02 0701 - 09/03 0700 In: 483 [P.O.:480; I.V.:3] Out: 1260 [Urine:1260] Intake/Output this shift: No intake/output data recorded.  General appearance: alert and cooperative Neurologic: intact Heart: regular rate and rhythm Lungs: clear to auscultation bilaterally Extremities: extremities normal, atraumatic, no cyanosis or edema Wound: dressing dry  Lab Results: Recent Labs    10/15/18 1756 10/16/18 0426  WBC 10.6* 9.8  HGB 8.7* 7.7*  HCT 29.2* 26.1*  PLT 227 207   BMET:  Recent Labs    10/15/18 1756 10/16/18 0426  NA 132* 132*  K 4.0 4.0  CL 100 101  CO2 23 22  GLUCOSE 163* 103*  BUN 9 9  CREATININE 0.79 0.72  CALCIUM 8.1* 7.9*    PT/INR:  Recent Labs    10/14/18 1257  LABPROT 17.0*  INR 1.4*   ABG    Component Value Date/Time   PHART 7.359 10/14/2018 1755   HCO3 23.2 10/14/2018 1755   TCO2 24 10/14/2018 1755   ACIDBASEDEF 2.0 10/14/2018 1755   O2SAT 99.0 10/14/2018 1755   CBG (last 3)  Recent Labs    10/16/18 1601 10/16/18 2053 10/17/18 0656  GLUCAP 115* 111* 97    Assessment/Plan: S/P Procedure(s) (LRB): CORONARY ARTERY BYPASS GRAFTING (CABG) x3. Left internal mammary take down. Left endoscopic saphenous vein harvest. (N/A) TRANSESOPHAGEAL ECHOCARDIOGRAM (TEE)  (N/A)  POD 3 Hemodynamically stable in sinus rhythm or Coreg and amio. Amio started yesterday for frequent PAC's and impending atrial fib. PAC's have dissappeared.  Wt is about at baseline so will not diurese further.  Continue IS, ambulation  DM: glucose under good control. Will stop Levemir and contine SSI today. Not on any meds at home.  Waiting on bed on 4E.  Probably home Saturday if she continues to progress.   LOS: 7 days    Gaye Pollack 10/17/2018

## 2018-10-17 NOTE — Progress Notes (Signed)
Home CPAP set up for pt. Water chamber refilled, 2L O2 bled in, and machine within pt's reach. Pt to notify for RT if any further assistance is needed.

## 2018-10-18 LAB — GLUCOSE, CAPILLARY
Glucose-Capillary: 116 mg/dL — ABNORMAL HIGH (ref 70–99)
Glucose-Capillary: 141 mg/dL — ABNORMAL HIGH (ref 70–99)
Glucose-Capillary: 129 mg/dL — ABNORMAL HIGH (ref 70–99)
Glucose-Capillary: 107 mg/dL — ABNORMAL HIGH (ref 70–99)

## 2018-10-18 LAB — MAGNESIUM: Magnesium: 2 mg/dL (ref 1.7–2.4)

## 2018-10-18 NOTE — Evaluation (Signed)
Physical Therapy Evaluation Patient Details Name: Connie Owens MRN: [id number] DOB: [birth year] Today's Date: 10/18/2018   History of Present Illness  Patient is a 73 y/o female admitted with CP now s/p CAGB x 3.  PMH positive for CAD, HTN, sleep apnea w/CPAP, ischemic cardiomyopathy, GERD, glaucoma, diverticular disease, NSTEMI.  Clinical Impression  Patient presents with decreased independence with mobility due to general weakness, decreased balance and decreased activity tolerance.  Currently minguard to S for hallway ambulation with RW and limited to about 130' leaning on walker twice and reporting SOB and back fatigue/pain.  Was on RA as RN walking her to bathroom, so walked on RA in hallway with SpO2 measuring 77% with poor waveform and not correlating with HR, but recovery on 2L O2 up to 2 minutes to read accurately with SpO2 94%.  May need further walking O2 saturations if needs home O2.  Recommend follow up HHPT at d/c as well as family assistance 24/7 (which pt states they will provide).  Will follow up if not d/c.     Follow Up Recommendations Home health PT;Supervision/Assistance - 24 hour    Equipment Recommendations  None recommended by PT    Recommendations for Other Services       Precautions / Restrictions Precautions Precautions: Fall;Sternal      Mobility  Bed Mobility Overal bed mobility: Needs Assistance Bed Mobility: Sit to Supine       Sit to supine: Mod assist   General bed mobility comments: assisted with legs into bed, cue for technique and use of pillow  Transfers Overall transfer level: Needs assistance Equipment used: Rolling walker (2 wheeled) Transfers: Sit to/from Stand Sit to Stand: Supervision         General transfer comment: for safety  Ambulation/Gait Ambulation/Gait assistance: Min guard;Supervision Gait Distance (Feet): 130 Feet Assistive device: Rolling walker (2 wheeled) Gait Pattern/deviations: Step-to  pattern;Shuffle;Decreased stride length;Trunk flexed     General Gait Details: leaning onto walker x 3 to rest upper back, on RA throughout as was on RA in bathroom (RN took to bathroom,) SpO2 with poor reading on earlobe, but 77% after ambulation, back up to 94% with 2 minutes rest on 2L O2  Stairs            Wheelchair Mobility    Modified Rankin (Stroke Patients Only)       Balance Overall balance assessment: Needs assistance   Sitting balance-Leahy Scale: Good     Standing balance support: Bilateral upper extremity supported Standing balance-Leahy Scale: Poor Standing balance comment: leaning on counter while washing hands after toileting                             Pertinent Vitals/Pain Pain Assessment: Faces Faces Pain Scale: Hurts even more Pain Location: back w/fatigue Pain Descriptors / Indicators: Discomfort;Guarding Pain Intervention(s): Monitored during session;Repositioned;Limited activity within patient's tolerance    Home Living Family/patient expects to be discharged to:: Private residence Living Arrangements: Alone(children to assist) Available Help at Discharge: Family;Available 24 hours/day Type of Home: House Home Access: Level entry(from garage)     Home Layout: One level Home Equipment: [place] - 2 wheels;Shower seat;Hand held shower head Additional Comments: equipment was her husband's    Prior Function Level of Independence: Independent               Hand Dominance        Extremity/Trunk Assessment   Upper Extremity Assessment Upper  Extremity Assessment: Generalized weakness    Lower Extremity Assessment Lower Extremity Assessment: Generalized weakness    Cervical / Trunk Assessment Cervical / Trunk Assessment: Kyphotic  Communication   Communication: No difficulties  Cognition Arousal/Alertness: Awake/alert Behavior During Therapy: WFL for tasks assessed/performed;Flat affect Overall Cognitive Status:  Within Functional Limits for tasks assessed                                        General Comments General comments (skin integrity, edema, etc.): VSS except for SpO2 with ambulation on RA (poor reading, but reads 77%, took 2 minutes to get accurate reading with correlating HR with monitor and SpO2 94% on 2L O2    Exercises     Assessment/Plan    PT Assessment Patient needs continued PT services  PT Problem List Decreased strength;Decreased activity tolerance;Decreased mobility;Decreased knowledge of use of DME;Decreased safety awareness;Decreased knowledge of precautions       PT Treatment Interventions DME instruction;Therapeutic activities;Balance training;Patient/family education;Therapeutic exercise;Functional mobility training;Gait training    PT Goals (Current goals can be found in the Care Plan section)  Acute Rehab PT Goals Patient Stated Goal: to return to independent PT Goal Formulation: With patient Time For Goal Achievement: 11/01/18 Potential to Achieve Goals: Good    Frequency Min 3X/week   Barriers to discharge        Co-evaluation               AM-PAC PT "6 Clicks" Mobility  Outcome Measure Help needed turning from your back to your side while in a flat bed without using bedrails?: A Little Help needed moving from lying on your back to sitting on the side of a flat bed without using bedrails?: A Lot Help needed moving to and from a bed to a chair (including a wheelchair)?: None Help needed standing up from a chair using your arms (e.g., wheelchair or bedside chair)?: None Help needed to walk in hospital room?: A Little Help needed climbing 3-5 steps with a railing? : A Little 6 Click Score: 19    End of Session   Activity Tolerance: Patient limited by fatigue Patient left: in bed;with call bell/phone within reach Nurse Communication: Other (comment)(in bed to allow removal of pacing wires) PT Visit Diagnosis: Other abnormalities  of gait and mobility (R26.89);Muscle weakness (generalized) (M62.81)    Time: 1415-1440 PT Time Calculation (min) (ACUTE ONLY): 25 min   Charges:   PT Evaluation $PT Eval Low Complexity: 1 Low PT Treatments $Gait Training: 8-22 mins        Magda Kiel, [place] [phone] 10/18/2018   Reginia Naas 10/18/2018, 3:54 PM

## 2018-10-18 NOTE — Progress Notes (Signed)
Epicardial pacing wires removed per MD order without difficulty.  Sequential BP monitoring and bedrest for one hour explained to pt.  Will continue to monitor.

## 2018-10-18 NOTE — Progress Notes (Signed)
Notified by CCMD that patient had 6 beat run SVT. Patient sleeping and asymptomatic. Will continue to monitor

## 2018-10-18 NOTE — Care Management Important Message (Signed)
Important Message  Patient Details  Name: Connie Owens MRN: [id number] Date of Birth: [birth year]   Medicare Important Message Given:  Yes     Memory Argue 10/18/2018, 3:00 PM

## 2018-10-18 NOTE — Progress Notes (Signed)
      [address]       [place] [address]             [phone]      4 Days Post-Op Procedure(s) (LRB): CORONARY ARTERY BYPASS GRAFTING (CABG) x3. Left internal mammary take down. Left endoscopic saphenous vein harvest. (N/A) TRANSESOPHAGEAL ECHOCARDIOGRAM (TEE) (N/A) Subjective: Feels good this morning. No issues overnight. She gets around well with the walker and feels stronger each day.  Objective: Vital signs in last 24 hours: Temp:  [97.8 F (36.6 C)-98.6 F (37 C)] 97.8 F (36.6 C) (09/04 0431) Pulse Rate:  [91-105] 93 (09/04 0500) Cardiac Rhythm: Normal sinus rhythm (09/04 0641) Resp:  [15-27] 15 (09/04 0500) BP: (99-120)/(55-93) 113/62 (09/04 0431) SpO2:  [86 %-100 %] 91 % (09/04 0500) Weight:  [94.4 kg] 94.4 kg (09/04 0500)     Intake/Output from previous day: 09/03 0701 - 09/04 0700 In: 893.1 [P.O.:360; I.V.:433.1; IV Piggyback:100] Out: 850 [Urine:850] Intake/Output this shift: No intake/output data recorded.  General appearance: alert, cooperative and no distress Heart: regular rate and rhythm, S1, S2 normal, no murmur, click, rub or gallop Lungs: clear to auscultation bilaterally Abdomen: soft, non-tender; bowel sounds normal; no masses,  no organomegaly Extremities: extremities normal, atraumatic, no cyanosis or edema Wound: clean and dry  Lab Results: Recent Labs    10/16/18 0426 10/17/18 0754  WBC 9.8 9.3  HGB 7.7* 8.0*  HCT 26.1* 26.4*  PLT 207 265   BMET:  Recent Labs    10/16/18 0426 10/17/18 0754  NA 132* 129*  K 4.0 4.3  CL 101 98  CO2 22 21*  GLUCOSE 103* 218*  BUN 9 10  CREATININE 0.72 0.66  CALCIUM 7.9* 8.1*    PT/INR: No results for input(s): LABPROT, INR in the last 72 hours. ABG    Component Value Date/Time   PHART 7.359 10/14/2018 1755   HCO3 23.2 10/14/2018 1755   TCO2 24 10/14/2018 1755   ACIDBASEDEF 2.0 10/14/2018 1755   O2SAT 99.0 10/14/2018 1755   CBG (last 3)  Recent Labs    10/17/18  1708 10/17/18 2140 10/18/18 0626  GLUCAP 124* 113* 116*    Assessment/Plan: S/P Procedure(s) (LRB): CORONARY ARTERY BYPASS GRAFTING (CABG) x3. Left internal mammary take down. Left endoscopic saphenous vein harvest. (N/A) TRANSESOPHAGEAL ECHOCARDIOGRAM (TEE) (N/A)  1. CV- hemodynamically stable and in NSR. On Coreg and Amio. Amio was started due to PACs and they have greatly improved. She did have a few beats of NSVT on the monitor. BP well controlled.  2. Pulm-CPAP at night for OSA, but room air during the day with good oxygen saturation.  3. Renal-creatinine 0.66, electrolytes okay 4. H and H 8.0/26.4, expected acute blood loss anemia.  5. Endo-blood glucose well controlled.   Plan:  Incisions are healing well. EPW out today. Likely home tomorrow.    LOS: 8 days    Elgie Collard 10/18/2018

## 2018-10-18 NOTE — Progress Notes (Signed)
CARDIOLOGY RECOMMENDATIONS:  Discharge is anticipated in the next 48 hours. Recommendations for medications and follow up:  Discharge Medications: Continue medications as they are currently [facility] in the [facility]. Exceptions to the above:  none  Follow Up: The patient's Primary Cardiologist is Minus Breeding, MD   Follow up in the office in 2 week(s).  Signed,  Peter Martinique, MD  7:48 AM 10/18/2018  [facility]

## 2018-10-18 NOTE — Progress Notes (Signed)
Patient has home CPAP at bedside. 

## 2018-10-18 NOTE — Progress Notes (Signed)
CARDIAC REHAB PHASE I   PRE:  Rate/Rhythm: 98 SR  BP:  Sitting: 124/67      SaO2: 85 RA --> 92 2L  MODE:  Ambulation: 120 ft   POST:  Rate/Rhythm: 108 ST  BP:  Sitting: 101/59    SaO2: 87-92 2L  Pt ambulated 172ft in hallway assist of two with gait belt and front wheel walker. Pt needing to cut walk short due to dizziness, pain, and nausea. Pt helped to recliner. Stressed importance of sitting in chair for meals, walks, and IS use. Pt needs motivation and seems to be held back by her anxieties. Pt wanting to d/c tomorrow, but informed pt she needs to walk twice more today and continue IS use to get off oxygen. Pt obtaining 750 on IS. Will continue to follow and encourage ambulation.  [id number] Rufina Falco, RN BSN 10/18/2018 10:26 AM

## 2018-10-19 DIAGNOSIS — I480 Paroxysmal atrial fibrillation: Secondary | ICD-10-CM

## 2018-10-19 LAB — BASIC METABOLIC PANEL
Anion gap: 12 (ref 5–15)
BUN: 9 mg/dL (ref 8–23)
CO2: 22 mmol/L (ref 22–32)
Calcium: 8.2 mg/dL — ABNORMAL LOW (ref 8.9–10.3)
Chloride: 93 mmol/L — ABNORMAL LOW (ref 98–111)
Creatinine, Ser: 0.67 mg/dL (ref 0.44–1.00)
GFR calc Af Amer: >60 mL/min (ref >60)
GFR calc non Af Amer: >60 mL/min (ref >60)
Glucose, Bld: 127 mg/dL — ABNORMAL HIGH (ref 70–99)
Potassium: 3.9 mmol/L (ref 3.5–5.1)
Sodium: 127 mmol/L — ABNORMAL LOW (ref 135–145)

## 2018-10-19 LAB — GLUCOSE, CAPILLARY
Glucose-Capillary: 120 mg/dL — ABNORMAL HIGH (ref 70–99)
Glucose-Capillary: 166 mg/dL — ABNORMAL HIGH (ref 70–99)
Glucose-Capillary: 82 mg/dL (ref 70–99)
Glucose-Capillary: 116 mg/dL — ABNORMAL HIGH (ref 70–99)

## 2018-10-19 LAB — TSH: TSH: 3.308 u[IU]/mL (ref 0.350–4.500)

## 2018-10-19 MED ORDER — AMIODARONE HCL IN DEXTROSE 360-4.14 MG/200ML-% IV SOLN
30.0000 mg/h | INTRAVENOUS | Status: DC
  Filled 2018-10-19: qty 200

## 2018-10-19 MED ORDER — CARVEDILOL 6.25 MG PO TABS
6.2500 mg | ORAL_TABLET | Freq: Two times a day (BID) | ORAL | Status: DC
  Administered 2018-10-19 – 2018-10-20 (×3): 6.25 mg via ORAL
  Filled 2018-10-19 (×3): qty 1

## 2018-10-19 MED ORDER — AMIODARONE HCL 200 MG PO TABS
400.0000 mg | ORAL_TABLET | Freq: Two times a day (BID) | ORAL | Status: DC
  Administered 2018-10-19 – 2018-10-20 (×3): 400 mg via ORAL
  Filled 2018-10-19 (×3): qty 2

## 2018-10-19 MED ORDER — AMIODARONE HCL IN DEXTROSE 360-4.14 MG/200ML-% IV SOLN
60.0000 mg/h | INTRAVENOUS | Status: DC
  Administered 2018-10-19: 60 mg/h via INTRAVENOUS
  Filled 2018-10-19: qty 200

## 2018-10-19 NOTE — Progress Notes (Addendum)
Occupational Therapy Evaluation Patient Details Name: Connie Owens MRN: [id number] DOB: 11/03/1945 Today's Date: 10/19/2018    History of Present Illness Patient is a 73 y/o female admitted with CP now s/p CAGB x 3. Developed ATRIAL FIB:  NSR post op. Now on PO amiodarone.   PMH positive for CAD, HTN, sleep apnea w/CPAP, ischemic cardiomyopathy, GERD, glaucoma, diverticular disease, NSTEMI.   Clinical Impression   PTA, pt independent with all ADL and mobility and lived alone. Pt has made steady progress and will have excellent family support after DC. Recommend follow up with HHOT to facilitate return to PLOF with ADL and IADL tasks. Pt/family in agreement. All further OT to be addressed by [place].     Follow Up Recommendations  Home health OT;Supervision/Assistance - 24 hour(initially)    Equipment Recommendations  None recommended by OT    Recommendations for Other Services       Precautions / Restrictions Precautions Precautions: Fall;Sternal Precaution Booklet Issued: Yes (comment)      Mobility Bed Mobility               General bed mobility comments: OOB in chair. Pt able to verbalize proper technique  Transfers Overall transfer level: Needs assistance Equipment used: Rolling walker (2 wheeled) Transfers: Sit to/from Stand Sit to Stand: Supervision              Balance Overall balance assessment: Needs assistance;Mild deficits observed, not formally tested   Sitting balance-Leahy Scale: Good       Standing balance-Leahy Scale: Fair                             ADL either performed or assessed with clinical judgement   ADL Overall ADL's : Needs assistance/impaired Eating/Feeding: Independent   Grooming: Set up;Supervision/safety;Sitting   Upper Body Bathing: Sitting;Minimal assistance   Lower Body Bathing: Minimal assistance;Sit to/from stand   Upper Body Dressing : Minimal assistance;Sitting   Lower Body Dressing: Minimal  assistance;Sit to/from stand   Toilet Transfer: Min guard;Ambulation;RW   Toileting- Clothing Manipulation and Hygiene: Supervision/safety;Sit to/from stand       Functional mobility during ADLs: Min guard;Rolling walker General ADL Comments: Educated on compensatory techniques whikle staying "in the tube". Daughter present for educaiton; also discussed scar management when allowed by MD  Educated on management of ADL while adhering to sternal precautions     Vision         Perception     Praxis      Pertinent Vitals/Pain Pain Assessment: No/denies pain     Hand Dominance Right   Extremity/Trunk Assessment Upper Extremity Assessment Upper Extremity Assessment: Generalized weakness   Lower Extremity Assessment Lower Extremity Assessment: Defer to PT evaluation   Cervical / Trunk Assessment Cervical / Trunk Assessment: Normal   Communication Communication Communication: No difficulties   Cognition Arousal/Alertness: Awake/alert Behavior During Therapy: WFL for tasks assessed/performed;Flat affect Overall Cognitive Status: Within Functional Limits for tasks assessed                                     General Comments  Educated on energy conservation strategies. Pt/daughter verbalized understanding.    Exercises     Shoulder Instructions      Home Living Family/patient expects to be discharged to:: Private residence Living Arrangements: Alone Available Help at Discharge: Family;Available 24 hours/day Type of Home:  House Home Access: Level entry     Home Layout: One level     Bathroom Shower/Tub: Occupational psychologist: Standard Bathroom Accessibility: Yes How Accessible: Accessible via walker Home Equipment: [place] - 2 wheels;Shower seat;Hand held shower head   Additional Comments: equipment was her husband's      Prior Functioning/Environment Level of Independence: Independent                 OT Problem List:  Decreased activity tolerance;Decreased knowledge of use of DME or AE;Decreased knowledge of precautions;Cardiopulmonary status limiting activity;Obesity      OT Treatment/Interventions:      OT Goals(Current goals can be found in the care plan section) Acute Rehab OT Goals Patient Stated Goal: to return to independent OT Goal Formulation: With patient  OT Frequency:     Barriers to D/C:            Co-evaluation              AM-PAC OT "6 Clicks" Daily Activity     Outcome Measure Help from another person eating meals?: None Help from another person taking care of personal grooming?: A Little Help from another person toileting, which includes using toliet, bedpan, or urinal?: A Little Help from another person bathing (including washing, rinsing, drying)?: A Little Help from another person to put on and taking off regular upper body clothing?: A Little Help from another person to put on and taking off regular lower body clothing?: A Little 6 Click Score: 19   End of Session Equipment Utilized During Treatment: Rolling walker Nurse Communication: Mobility status  Activity Tolerance: Patient tolerated treatment well Patient left: in chair;with family/visitor present;with call bell/phone within reach  OT Visit Diagnosis: Muscle weakness (generalized) (M62.81)                Time: [id number] OT Time Calculation (min): 21 min Charges:  OT General Charges $OT Visit: 1 Visit OT Evaluation $OT Eval Moderate Complexity: [place], OT/L   Acute OT Clinical Specialist Acute Rehabilitation Services Pager [phone] Office [phone]   [facility] 10/19/2018, 12:41 PM

## 2018-10-19 NOTE — Progress Notes (Addendum)
Patient went into afib rvr @0452 . Notified Dr. Prescott Gum. Orders given: BMET stat; 2L O2; vitals q 30 minutes for 2 hours; amio gtt (no bolus); all orders placed and carried out. Will continue to monitor

## 2018-10-19 NOTE — Plan of Care (Signed)
  Problem: Health Behavior/Discharge Planning: Goal: Ability to manage health-related needs will improve Outcome: Progressing   Problem: Clinical Measurements: Goal: Will remain free from infection Outcome: Progressing Goal: Respiratory complications will improve Outcome: Progressing Goal: Cardiovascular complication will be avoided Outcome: Progressing   

## 2018-10-19 NOTE — Progress Notes (Signed)
Notifed Erin Barrett PA of pt c/o stabbing pain 6-10. Ordered pain meds given see MAR.

## 2018-10-19 NOTE — Plan of Care (Signed)
  Problem: Clinical Measurements: Goal: Diagnostic test results will improve Outcome: Progressing Goal: Respiratory complications will improve Outcome: Progressing Goal: Cardiovascular complication will be avoided Outcome: Progressing   

## 2018-10-19 NOTE — Progress Notes (Signed)
CARDIAC REHAB PHASE I   PRE:  Rate/Rhythm: 85 SR    BP: sitting 91/56    SaO2: 100 2L, 96 RA  MODE:  Ambulation: 220 ft   POST:  Rate/Rhythm: 103 ST    BP: sitting 104/67     SaO2: 93 RA  Pt ready to ambulate. Able to get out of bed with min verbal cues and min assist. Stood by rocking. Used RW, steady. C/o fatigue with distance. SaO2 on ear maintained 93-98 RA entire walk, will not need O2. To recliner. Discussed sternal precautions, IS, mobility/walking at home, diet, and CRPII with pt and daughter. Will refer to [place] CRPII. Also interested in virtual CRPII. Her daughter had many questions, esp regarding Afib. Good discussion and understanding.  [place], ACSM 10/19/2018 11:53 AM

## 2018-10-19 NOTE — Progress Notes (Signed)
Progress Note  Patient Name: Connie Owens Date of Encounter: 10/19/2018  Primary Cardiologist:   Minus Breeding, MD   Subjective   No pain.  She hopes to go home.   Inpatient Medications    Scheduled Meds: . amiodarone  400 mg Oral BID  . aspirin EC  325 mg Oral Daily  . brimonidine-timolol  1 drop Left Eye Q12H  . carvedilol  6.25 mg Oral BID WC  . Chlorhexidine Gluconate Cloth  6 each Topical Daily  . enoxaparin (LOVENOX) injection  40 mg Subcutaneous QHS  . ferrous [id number] C-folic acid  1 capsule Oral BID PC  . insulin aspart  0-24 Units Subcutaneous TID AC & HS  . latanoprost  1 drop Both Eyes QHS  . mouth rinse  15 mL Mouth Rinse BID  . moving right along book   Does not apply Once  . nortriptyline  50 mg Oral QHS  . pantoprazole  40 mg Oral QAC breakfast  . prednisoLONE acetate  1 drop Right Eye BID  . prednisoLONE acetate  1 drop Left Eye QHS  . sodium chloride flush  3 mL Intravenous Q12H   Continuous Infusions: . sodium chloride     PRN Meds: sodium chloride, acetaminophen, ALPRAZolam, ondansetron **OR** ondansetron (ZOFRAN) IV, oxyCODONE, senna-docusate, sodium chloride flush, traMADol   Vital Signs    Vitals:   10/19/18 0500 10/19/18 0522 10/19/18 0556 10/19/18 0735  BP: 115/61   112/68  Pulse: (!) 123  (!) 129 (!) 112  Resp: 19  18 19   Temp: 98.8 F (37.1 C)   98.5 F (36.9 C)  TempSrc: Oral   Oral  SpO2: 93%  94% 95%  Weight:  94.7 kg    Height:        Intake/Output Summary (Last 24 hours) at 10/19/2018 0954 Last data filed at 10/19/2018 0438 Gross per 24 hour  Intake 0 ml  Output -  Net 0 ml   Filed Weights   10/17/18 0500 10/18/18 0500 10/19/18 0522  Weight: 91.4 kg 94.4 kg 94.7 kg    Telemetry    NSR, PAF - Personally Reviewed  ECG    NA - Personally Reviewed  Physical Exam   GEN: No acute distress.   Neck: No  JVD Cardiac: RRR, no murmurs, rubs, or gallops.  Respiratory: Clear no to auscultation  bilaterally. GI: Soft, nontender, non-distended  MS: No  edema; No deformity. Neuro:  Nonfocal  Psych: Normal affect   Labs    Chemistry Recent Labs  Lab 10/14/18 0101  10/16/18 0426 10/17/18 0754 10/19/18 0608  NA 137   < > 132* 129* 127*  K 4.0   < > 4.0 4.3 3.9  CL 102   < > 101 98 93*  CO2 25   < > 22 21* 22  GLUCOSE 117*   < > 103* 218* 127*  BUN 11   < > 9 10 9   CREATININE 0.76   < > 0.72 0.66 0.67  CALCIUM 9.2   < > 7.9* 8.1* 8.2*  PROT 6.3*  --   --   --   --   ALBUMIN 3.2*  --   --   --   --   AST 15  --   --   --   --   ALT 14  --   --   --   --   ALKPHOS 71  --   --   --   --  BILITOT 0.3  --   --   --   --   GFRNONAA >60   < > >60 >60 >60  GFRAA >60   < > >60 >60 >60  ANIONGAP 10   < > 9 10 12    < > = values in this interval not displayed.     Hematology Recent Labs  Lab 10/15/18 1756 10/16/18 0426 10/17/18 0754  WBC 10.6* 9.8 9.3  RBC 3.49* 3.09* 3.20*  HGB 8.7* 7.7* 8.0*  HCT 29.2* 26.1* 26.4*  MCV 83.7 84.5 82.5  MCH 24.9* 24.9* 25.0*  MCHC 29.8* 29.5* 30.3  RDW 15.9* 15.8* 15.6*  PLT 227 207 265    Cardiac EnzymesNo results for input(s): TROPONINI in the last 168 hours. No results for input(s): TROPIPOC in the last 168 hours.   BNPNo results for input(s): BNP, PROBNP in the last 168 hours.   DDimer No results for input(s): DDIMER in the last 168 hours.   Radiology    No results found.  Cardiac Studies   TEE   Left Ventricle: The left ventricle has normal systolic function, with an ejection fraction of 55-60%. The cavity size was normal. The left ventricular wall thickness was not assessed.  Right Ventricle: The right ventricle has normal systolic function. The cavity was normal. There is no increase in right ventricular wall thickness.  Left Atrium: Left atrial size was not assessed. The left atrial appendage is well visualized and there is no evidence of thrombus present.  Right Atrium: Right atrial size was not assessed.   Interatrial Septum: No atrial level shunt detected by color flow Doppler.  Pericardium: There is no evidence of pericardial effusion.  Mitral Valve: The mitral valve is normal in structure. Mitral valve regurgitation is mild by color flow Doppler. There is no evidence of mitral valve vegetation.  Tricuspid Valve: The tricuspid valve was normal in structure. Tricuspid valve regurgitation is trivial by color flow Doppler.  Aortic Valve: The aortic valve is normal in structure. Aortic valve regurgitation was not visualized by color flow Doppler.  Pulmonic Valve: The pulmonic valve was normal in structure.  Patient Profile     73 y.o. female with multivessel CAD with CABG.  Post op atrial fib.   Assessment & Plan    CAD/CABG:    Good post op recovery.    ATRIAL FIB:  NSR.  Now on PO amiodarone.   Send home on PO 400 mg BID and we will try to see her within 7 days to reduce dosiing.  Meds as listed.     DYSLIPIDEMIA:  Continue current meds.  She has been on Repatha.    For questions or updates, please contact [facility] Please consult [email or web address] for contact info under Cardiology/STEMI.   Signed, Minus Breeding, MD  10/19/2018, 9:54 AM

## 2018-10-19 NOTE — Progress Notes (Signed)
Bath complete. Pt c/o 6-10 numerical pain scale "stabbing pain. Pt also dizzy after being up brushing teeth and bath. EKG done placed on chart. BP: 140/68, HR: 97; 87%RA, 2L placed on pt with O2 up to 96%.

## 2018-10-19 NOTE — Progress Notes (Addendum)
      [address]       [place] [address]             [phone]      5 Days Post-Op Procedure(s) (LRB): CORONARY ARTERY BYPASS GRAFTING (CABG) x3. Left internal mammary take down. Left endoscopic saphenous vein harvest. (N/A) TRANSESOPHAGEAL ECHOCARDIOGRAM (TEE) (N/A)   Subjective:  Upset about going into A. Fib, she feels this is due to excessive gas.  + ambulation  + BM  Objective: Vital signs in last 24 hours: Temp:  [98.3 F (36.8 C)-100.5 F (38.1 C)] 98.8 F (37.1 C) (09/05 0500) Pulse Rate:  [42-129] 129 (09/05 0556) Cardiac Rhythm: Atrial fibrillation (09/05 0452) Resp:  [18-25] 18 (09/05 0556) BP: (106-133)/(61-74) 115/61 (09/05 0500) SpO2:  [85 %-96 %] 94 % (09/05 0556) Weight:  [94.7 kg] 94.7 kg (09/05 0522)  General appearance: alert, cooperative and no distress Heart: irregularly irregular rhythm Lungs: clear to auscultation bilaterally Abdomen: soft, non-tender; bowel sounds normal; no masses,  no organomegaly Extremities: edema trace Wound: clean and dry  Lab Results: Recent Labs    10/17/18 0754  WBC 9.3  HGB 8.0*  HCT 26.4*  PLT 265   BMET:  Recent Labs    10/17/18 0754 10/19/18 0608  NA 129* 127*  K 4.3 3.9  CL 98 93*  CO2 21* 22  GLUCOSE 218* 127*  BUN 10 9  CREATININE 0.66 0.67  CALCIUM 8.1* 8.2*    PT/INR: No results for input(s): LABPROT, INR in the last 72 hours. ABG    Component Value Date/Time   PHART 7.359 10/14/2018 1755   HCO3 23.2 10/14/2018 1755   TCO2 24 10/14/2018 1755   ACIDBASEDEF 2.0 10/14/2018 1755   O2SAT 99.0 10/14/2018 1755   CBG (last 3)  Recent Labs    10/18/18 1709 10/18/18 2141 10/19/18 0650  GLUCAP 129* 107* 120*    Assessment/Plan: S/P Procedure(s) (LRB): CORONARY ARTERY BYPASS GRAFTING (CABG) x3. Left internal mammary take down. Left endoscopic saphenous vein harvest. (N/A) TRANSESOPHAGEAL ECHOCARDIOGRAM (TEE) (N/A)  1. CV- Atrial Fibrillation- drip started this  morning, rate currently in the 120s- will increase Coreg to 6.25 mg BID, once converts will get started on oral Amiodarone as IV is running through peripheral site 2. Pulm- no acute issues, continue IS 3. Renal- creatinine WNL, K is 3.9, will give supplement today 4. Deconditioning- mild 5. Dispo- patient with A. Fib with RVR this morning, increase Coreg, on Amiodarone drip, will transition once converts, continue current care   LOS: 9 days    Erin Barrett 10/19/2018  patient examined and medical record reviewed,agree with above note. Tharon Aquas Trigt III 10/19/2018

## 2018-10-19 NOTE — Progress Notes (Signed)
EKG NSR

## 2018-10-20 LAB — GLUCOSE, CAPILLARY: Glucose-Capillary: 126 mg/dL — ABNORMAL HIGH (ref 70–99)

## 2018-10-20 MED ORDER — ASPIRIN 325 MG PO TBEC: 325.0000 mg | DELAYED_RELEASE_TABLET | Freq: Every day | ORAL | 0 refills | Status: AC

## 2018-10-20 MED ORDER — AMIODARONE HCL 200 MG PO TABS: 400.0000 mg | ORAL_TABLET | Freq: Two times a day (BID) | ORAL | Status: AC

## 2018-10-20 MED ORDER — OXYCODONE HCL 5 MG PO TABS: 5.0000 mg | ORAL_TABLET | ORAL | 0 refills | Status: AC | PRN

## 2018-10-20 NOTE — Progress Notes (Signed)
      [address]       [place] [address]             [phone]      6 Days Post-Op Procedure(s) (LRB): CORONARY ARTERY BYPASS GRAFTING (CABG) x3. Left internal mammary take down. Left endoscopic saphenous vein harvest. (N/A) TRANSESOPHAGEAL ECHOCARDIOGRAM (TEE) (N/A)   Subjective:  Up in chair eating breakfast, continues to feel well.  Ready to go home.  Objective: Vital signs in last 24 hours: Temp:  [97.7 F (36.5 C)-99 F (37.2 C)] 97.7 F (36.5 C) (09/06 0500) Pulse Rate:  [81-96] 81 (09/06 0500) Cardiac Rhythm: Normal sinus rhythm (09/05 2100) Resp:  [15-37] 15 (09/06 0500) BP: (99-137)/(53-76) 114/73 (09/06 0500) SpO2:  [89 %-100 %] 99 % (09/06 0500) Weight:  [93.9 kg] 93.9 kg (09/06 0500)  Intake/Output from previous day: 09/05 0701 - 09/06 0700 In: 339.9 [P.O.:240; I.V.:99.9] Out: 100 [Urine:100]  General appearance: alert, cooperative and no distress Heart: regular rate and rhythm Lungs: clear to auscultation bilaterally Abdomen: soft, non-tender; bowel sounds normal; no masses,  no organomegaly Extremities: edema minimal Wound: clean and dry  Lab Results: No results for input(s): WBC, HGB, HCT, PLT in the last 72 hours. BMET:  Recent Labs    10/19/18 0608  NA 127*  K 3.9  CL 93*  CO2 22  GLUCOSE 127*  BUN 9  CREATININE 0.67  CALCIUM 8.2*    PT/INR: No results for input(s): LABPROT, INR in the last 72 hours. ABG    Component Value Date/Time   PHART 7.359 10/14/2018 1755   HCO3 23.2 10/14/2018 1755   TCO2 24 10/14/2018 1755   ACIDBASEDEF 2.0 10/14/2018 1755   O2SAT 99.0 10/14/2018 1755   CBG (last 3)  Recent Labs    10/19/18 1626 10/19/18 2108 10/20/18 0632  GLUCAP 82 116* 126*    Assessment/Plan: S/P Procedure(s) (LRB): CORONARY ARTERY BYPASS GRAFTING (CABG) x3. Left internal mammary take down. Left endoscopic saphenous vein harvest. (N/A) TRANSESOPHAGEAL ECHOCARDIOGRAM (TEE) (N/A)  1. CV- PAF, maintaining  NSR- continue Coreg, Amiodarone 2. Pulm- no acute issues, continue IS 3. Renal-creatinine WNL, weight is at baseline, no Lasix at this time 4. Dispo- patient stable, maintaining NSR, will d/c home today   LOS: 10 days    Ellwood Handler 10/20/2018

## 2018-10-20 NOTE — Progress Notes (Signed)
Patient in a stable condition, discharge education reviewed with patient she verbalized understanding, iv removed, tele dc ccmd notified, patient belongings at bedside, prescription given to patient, patient to be transported home by her daughter.

## 2018-10-20 NOTE — Progress Notes (Signed)
Patient has home machine and needed assistance with placing water in machine. Paticet places herself on and off.

## 2018-10-23 ENCOUNTER — Telehealth (HOSPITAL_COMMUNITY): Payer: Self-pay

## 2018-10-23 NOTE — Telephone Encounter (Signed)
Pt insurance is active and benefits verified through Medicare a/b Co-pay 0, DED $198/$198 met, out of pocket 0/0 met, co-insurance 20%. no pre-authorization required. Passport, 10/23/2018_0 :28pm, REF# [id number]  Will contact patient to see if she is interested in the Cardiac Rehab Program. If interested, patient will need to complete follow up appt. Once completed, patient will be contacted for scheduling upon review by the RN Navigator.

## 2018-10-24 ENCOUNTER — Telehealth (HOSPITAL_COMMUNITY): Payer: Self-pay | Admitting: *Deleted

## 2018-10-24 NOTE — Telephone Encounter (Addendum)
Pt left message on voicemail. Called and left message for pt. Requested a call back to advise her of the scheduling process for cardiac rehab and insurance benefits. Pt has upcoming follow up appt with cardiology and heart surgeon.   Call back number provided. Cherre Huger, BSN Cardiac and Training and development officer

## 2018-10-25 ENCOUNTER — Other Ambulatory Visit: Payer: Self-pay

## 2018-10-25 ENCOUNTER — Encounter (INDEPENDENT_AMBULATORY_CARE_PROVIDER_SITE_OTHER): Payer: Self-pay

## 2018-10-25 DIAGNOSIS — Z4802 Encounter for removal of sutures: Secondary | ICD-10-CM

## 2018-10-28 DIAGNOSIS — W19XXXA Unspecified fall, initial encounter: Secondary | ICD-10-CM | POA: Diagnosis not present

## 2018-10-28 DIAGNOSIS — R4020 Unspecified coma: Secondary | ICD-10-CM | POA: Diagnosis not present

## 2018-11-05 ENCOUNTER — Ambulatory Visit: Payer: Medicare Other | Admitting: Adult Health

## 2018-11-13 ENCOUNTER — Ambulatory Visit: Payer: Medicare Other | Admitting: Surgery

## 2018-11-14 DIAGNOSIS — 419620001 Death: Secondary | SNOMED CT | POA: Diagnosis not present

## 2018-11-14 DEATH — deceased

## 2019-06-08 IMAGING — CR DG CHEST 2V
2 series · 2 of 2 positions shown · non-contrast
Comparison: 11/01/2017 chest radiograph.

CLINICAL DATA: Cough, dyspnea

EXAM:
CHEST - 2 VIEW

[w chest pa]
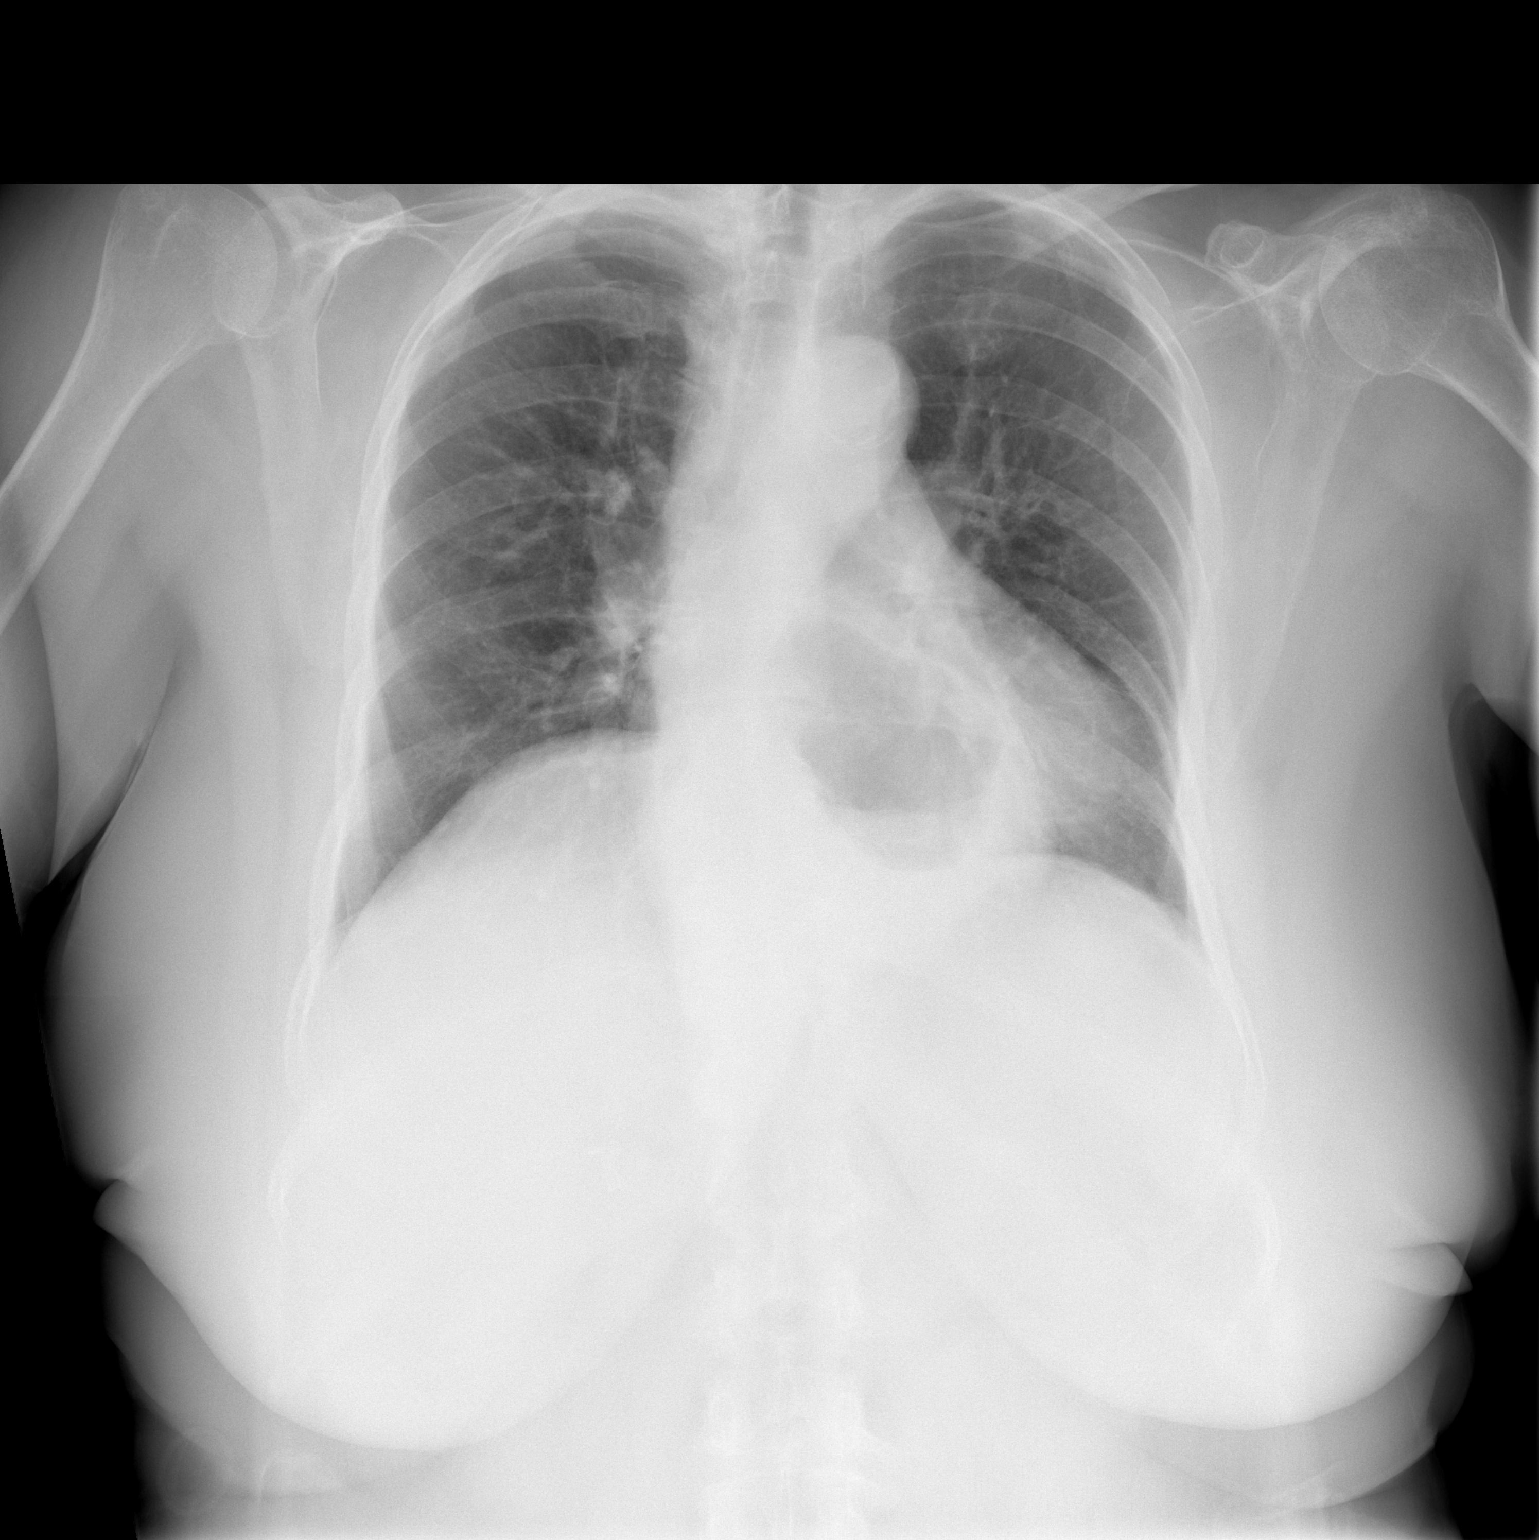

[w chest lat]
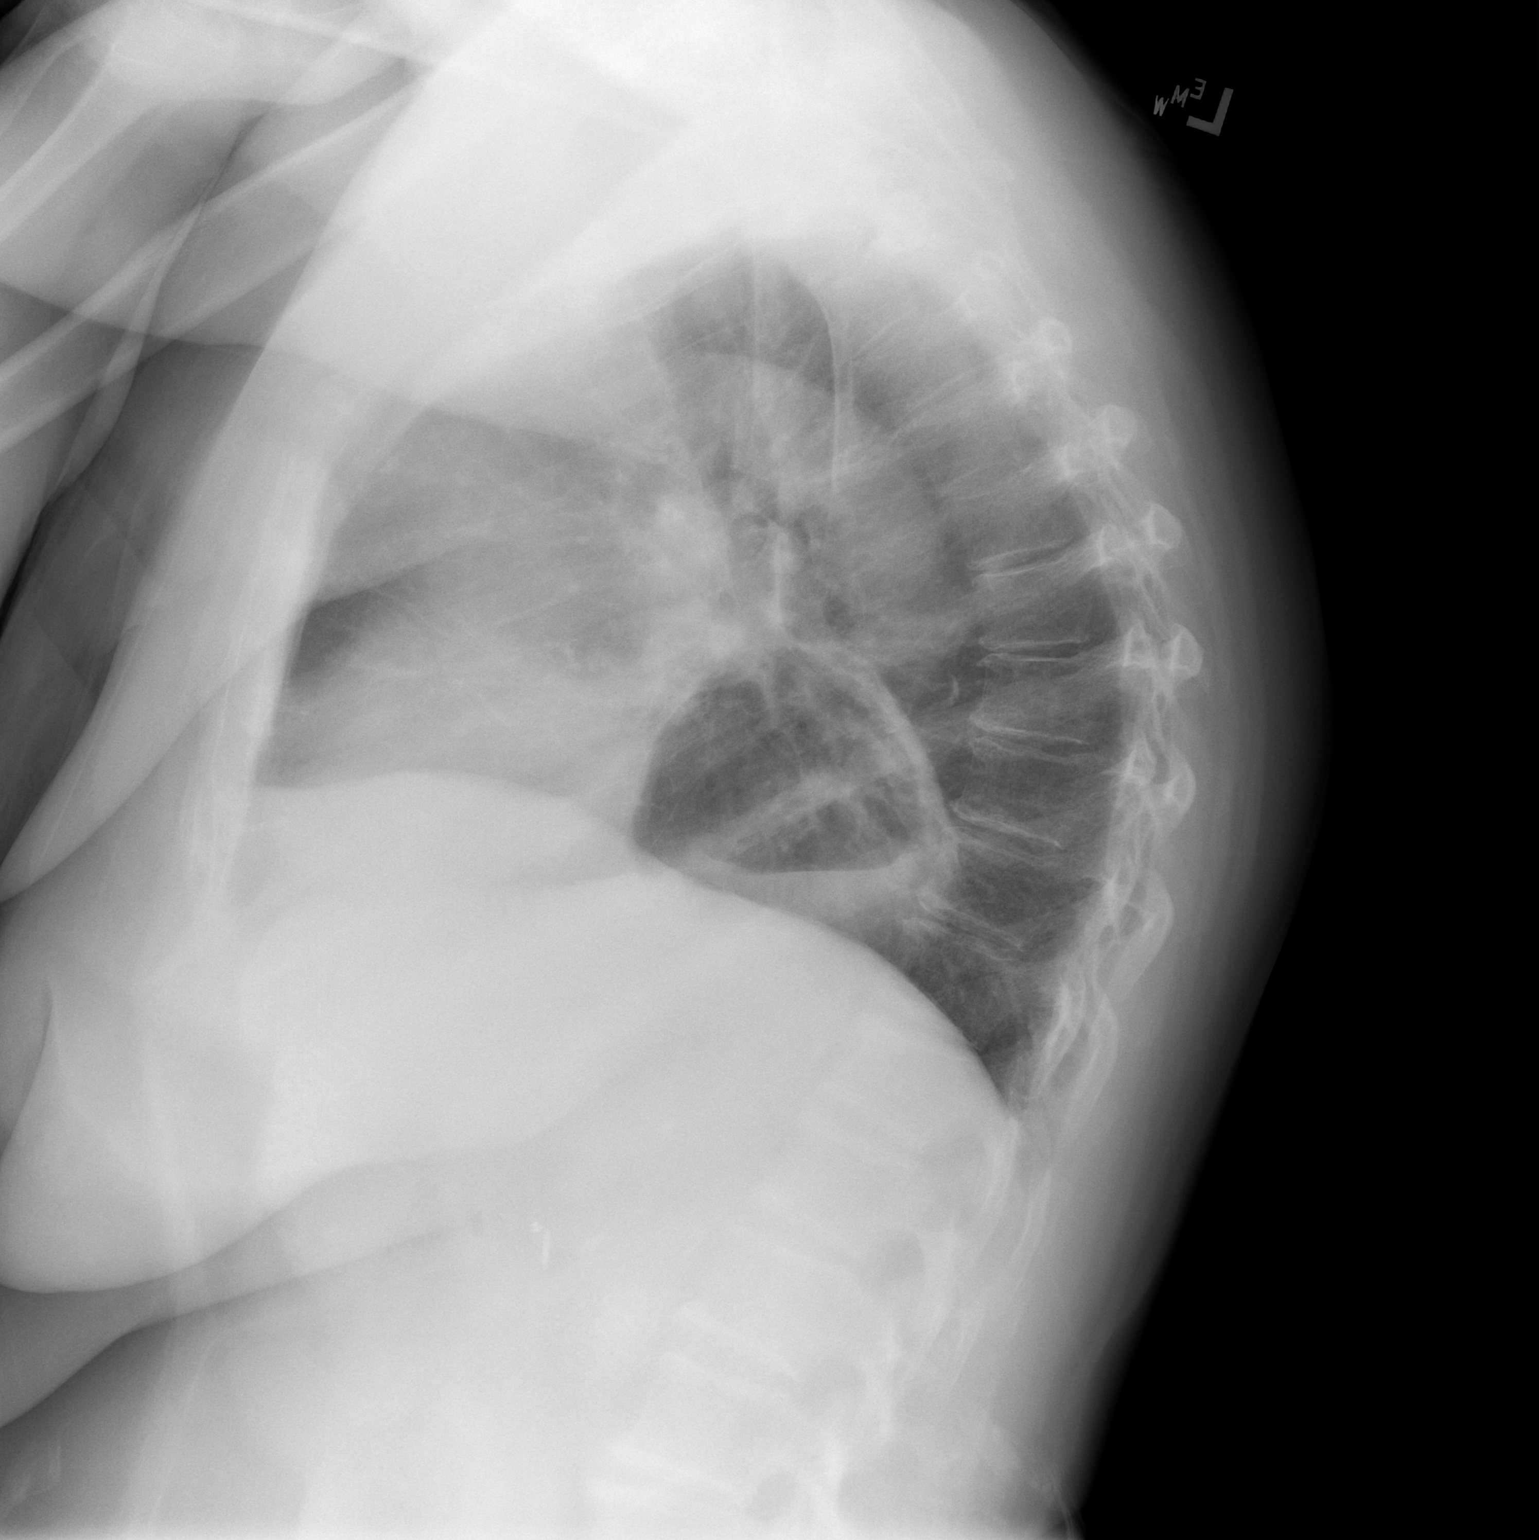

[2 of 2 positions shown; findings below may reference images not displayed]

FINDINGS: Stable cardiomediastinal silhouette with normal heart size and
moderate to large hiatal hernia. No pneumothorax. No pleural
effusion. Lungs appear clear, with no acute consolidative airspace
disease and no pulmonary edema. Previously described lung base
opacities have resolved. Cholecystectomy clips are seen in the right
upper quadrant of the abdomen.
IMPRESSION: No active cardiopulmonary disease.  Moderate to large hiatal hernia.

## 2019-09-17 ENCOUNTER — Ambulatory Visit: Payer: Medicare Other | Admitting: Adult Health
